# Patient Record
Sex: Female | Born: 1955
Health system: Southern US, Community
[De-identification: ages and names within clinical notes are randomized; demographics above are authoritative.]

## PROBLEM LIST (undated history)

## (undated) DIAGNOSIS — F32A Depression, unspecified: Secondary | ICD-10-CM

## (undated) DIAGNOSIS — R519 Headache, unspecified: Secondary | ICD-10-CM

## (undated) DIAGNOSIS — I10 Essential (primary) hypertension: Secondary | ICD-10-CM

## (undated) DIAGNOSIS — E785 Hyperlipidemia, unspecified: Secondary | ICD-10-CM

## (undated) DIAGNOSIS — G709 Myoneural disorder, unspecified: Secondary | ICD-10-CM

## (undated) DIAGNOSIS — G8929 Other chronic pain: Secondary | ICD-10-CM

## (undated) DIAGNOSIS — E119 Type 2 diabetes mellitus without complications: Secondary | ICD-10-CM

## (undated) DIAGNOSIS — M797 Fibromyalgia: Secondary | ICD-10-CM

## (undated) DIAGNOSIS — Z86010 Personal history of colonic polyps: Secondary | ICD-10-CM

## (undated) DIAGNOSIS — F419 Anxiety disorder, unspecified: Secondary | ICD-10-CM

## (undated) DIAGNOSIS — E039 Hypothyroidism, unspecified: Secondary | ICD-10-CM

## (undated) DIAGNOSIS — R51 Headache: Secondary | ICD-10-CM

## (undated) DIAGNOSIS — K589 Irritable bowel syndrome without diarrhea: Secondary | ICD-10-CM

## (undated) DIAGNOSIS — C801 Malignant (primary) neoplasm, unspecified: Secondary | ICD-10-CM

## (undated) DIAGNOSIS — M1990 Unspecified osteoarthritis, unspecified site: Secondary | ICD-10-CM

## (undated) DIAGNOSIS — N189 Chronic kidney disease, unspecified: Secondary | ICD-10-CM

## (undated) DIAGNOSIS — E559 Vitamin D deficiency, unspecified: Secondary | ICD-10-CM

## (undated) DIAGNOSIS — T7840XA Allergy, unspecified, initial encounter: Secondary | ICD-10-CM

## (undated) DIAGNOSIS — H269 Unspecified cataract: Secondary | ICD-10-CM

## (undated) DIAGNOSIS — J45909 Unspecified asthma, uncomplicated: Secondary | ICD-10-CM

## (undated) DIAGNOSIS — G4730 Sleep apnea, unspecified: Secondary | ICD-10-CM

## (undated) DIAGNOSIS — K219 Gastro-esophageal reflux disease without esophagitis: Secondary | ICD-10-CM

## (undated) DIAGNOSIS — D649 Anemia, unspecified: Secondary | ICD-10-CM

## (undated) HISTORY — DX: Fibromyalgia: M79.7

## (undated) HISTORY — PX: POLYPECTOMY: SHX149

## (undated) HISTORY — DX: Personal history of colonic polyps: Z86.010

## (undated) HISTORY — PX: VAGINAL HYSTERECTOMY: SUR661

## (undated) HISTORY — DX: Other chronic pain: G89.29

## (undated) HISTORY — DX: Type 2 diabetes mellitus without complications: E11.9

## (undated) HISTORY — DX: Headache: R51

## (undated) HISTORY — DX: Myoneural disorder, unspecified: G70.9

## (undated) HISTORY — DX: Unspecified cataract: H26.9

## (undated) HISTORY — DX: Hypothyroidism, unspecified: E03.9

## (undated) HISTORY — PX: EYE SURGERY: SHX253

## (undated) HISTORY — DX: Vitamin D deficiency, unspecified: E55.9

## (undated) HISTORY — DX: Allergy, unspecified, initial encounter: T78.40XA

## (undated) HISTORY — PX: TONSILLECTOMY AND ADENOIDECTOMY: SHX28

## (undated) HISTORY — DX: Unspecified osteoarthritis, unspecified site: M19.90

## (undated) HISTORY — DX: Chronic kidney disease, unspecified: N18.9

## (undated) HISTORY — DX: Depression, unspecified: F32.A

## (undated) HISTORY — PX: COLONOSCOPY: SHX174

## (undated) HISTORY — DX: Hyperlipidemia, unspecified: E78.5

## (undated) HISTORY — DX: Irritable bowel syndrome, unspecified: K58.9

## (undated) HISTORY — PX: CARDIAC CATHETERIZATION: SHX172

## (undated) HISTORY — DX: Essential (primary) hypertension: I10

## (undated) HISTORY — DX: Anxiety disorder, unspecified: F41.9

## (undated) HISTORY — PX: TONSILLECTOMY: SUR1361

## (undated) HISTORY — DX: Anemia, unspecified: D64.9

## (undated) HISTORY — DX: Gastro-esophageal reflux disease without esophagitis: K21.9

## (undated) HISTORY — PX: WISDOM TOOTH EXTRACTION: SHX21

## (undated) HISTORY — DX: Malignant (primary) neoplasm, unspecified: C80.1

## (undated) HISTORY — DX: Headache, unspecified: R51.9

---

## 1993-10-03 HISTORY — PX: CHOLECYSTECTOMY: SHX55

## 1997-10-03 DIAGNOSIS — Z860100 Personal history of colon polyps, unspecified: Secondary | ICD-10-CM

## 1997-10-03 HISTORY — DX: Personal history of colon polyps, unspecified: Z86.0100

## 1997-10-03 HISTORY — DX: Personal history of colonic polyps: Z86.010

## 1999-10-04 HISTORY — PX: NASAL SINUS SURGERY: SHX719

## 2000-09-27 DIAGNOSIS — Z860101 Personal history of adenomatous and serrated colon polyps: Secondary | ICD-10-CM

## 2000-09-27 DIAGNOSIS — Z86010 Personal history of colonic polyps: Secondary | ICD-10-CM

## 2000-09-27 HISTORY — DX: Personal history of adenomatous and serrated colon polyps: Z86.0101

## 2000-09-27 HISTORY — DX: Personal history of colonic polyps: Z86.010

## 2014-10-03 HISTORY — PX: COLONOSCOPY: SHX174

## 2015-02-10 ENCOUNTER — Encounter: Payer: Self-pay | Admitting: Primary Care

## 2015-02-10 ENCOUNTER — Ambulatory Visit (INDEPENDENT_AMBULATORY_CARE_PROVIDER_SITE_OTHER): Payer: Medicare PPO | Admitting: Primary Care

## 2015-02-10 VITALS — BP 142/92 | HR 84 | Temp 98.2°F | Ht 67.0 in | Wt 274.4 lb

## 2015-02-10 DIAGNOSIS — F419 Anxiety disorder, unspecified: Secondary | ICD-10-CM

## 2015-02-10 DIAGNOSIS — E1165 Type 2 diabetes mellitus with hyperglycemia: Secondary | ICD-10-CM | POA: Insufficient documentation

## 2015-02-10 DIAGNOSIS — I10 Essential (primary) hypertension: Secondary | ICD-10-CM | POA: Diagnosis not present

## 2015-02-10 DIAGNOSIS — J019 Acute sinusitis, unspecified: Secondary | ICD-10-CM | POA: Diagnosis not present

## 2015-02-10 DIAGNOSIS — E119 Type 2 diabetes mellitus without complications: Secondary | ICD-10-CM | POA: Insufficient documentation

## 2015-02-10 DIAGNOSIS — F329 Major depressive disorder, single episode, unspecified: Secondary | ICD-10-CM | POA: Insufficient documentation

## 2015-02-10 DIAGNOSIS — G8929 Other chronic pain: Secondary | ICD-10-CM | POA: Insufficient documentation

## 2015-02-10 DIAGNOSIS — F418 Other specified anxiety disorders: Secondary | ICD-10-CM

## 2015-02-10 DIAGNOSIS — F32A Depression, unspecified: Secondary | ICD-10-CM | POA: Insufficient documentation

## 2015-02-10 DIAGNOSIS — Z1211 Encounter for screening for malignant neoplasm of colon: Secondary | ICD-10-CM

## 2015-02-10 DIAGNOSIS — E039 Hypothyroidism, unspecified: Secondary | ICD-10-CM

## 2015-02-10 MED ORDER — AMOXICILLIN-POT CLAVULANATE 875-125 MG PO TABS: 1.0000 | ORAL_TABLET | Freq: Two times a day (BID) | ORAL | Status: DC

## 2015-02-10 MED ORDER — FLUTICASONE PROPIONATE 50 MCG/ACT NA SUSP: 2.0000 | Freq: Every day | NASAL | Status: DC

## 2015-02-10 MED ORDER — LOSARTAN POTASSIUM 100 MG PO TABS: 100.0000 mg | ORAL_TABLET | Freq: Every day | ORAL | Status: DC

## 2015-02-10 MED ORDER — AMLODIPINE BESYLATE 10 MG PO TABS: 10.0000 mg | ORAL_TABLET | Freq: Every day | ORAL | Status: DC

## 2015-02-10 MED ORDER — BENZONATATE 200 MG PO CAPS: 200.0000 mg | ORAL_CAPSULE | Freq: Three times a day (TID) | ORAL | Status: DC | PRN

## 2015-02-10 NOTE — Assessment & Plan Note (Signed)
Fibromyalgia, sacculitis, headaches. Managed by [facility] pain clinic in [place], [place].Marland Kitchen

## 2015-02-10 NOTE — Progress Notes (Signed)
Subjective:    Patient ID: Danielle Edwards, female    DOB: [birth year], 59 y.o.   MRN: [id number]  HPI  Danielle Edwards is a 59 year old female who presents today to establish care and discuss the problems mentioned below. Will obtain old records.  1) Hypertension: Diagnosed 20 years ago and has been on the same regimen for 15 years. Taking Amlodipine 10 mg and Losartan 100 mg tablets. She's been out of her amlodipine for one week and losartan for 3 days. Reports occasional ankle edema. Follows with Cardiology in [place], next appointment is in August.  2) Sinusitis: Her symptoms began 7 days ago with rhinorrhea, which he attributed to allergies. She then began coughing (productive with yellow sputum). Last night her symptoms worsened and now feels her frontal and maxillary sinuses are blocked and has developed a sore throat, fever, and chills. She has recurrent sinus infections twice a year durin seasonal changes (Spring and Fall). She reports allergies to dust and mold.  She's been taking Claritin daily for one month without help.  3) Type 2 Diabetes: Diagnosed 15 years ago. Taking Metformin 1000 mg BID and glimepiride 4 mg QD tablets. She had been on Januvia in the past which helped reduce her sugars but can no longer afford.  Last A1C was between 6-7 and was last checked over 6 months ago. Diet is overall unhealthy and consists of carbohydrates (waffles, chesse, sandwiches, milk, fast food). Drinks mostly regular sodas and some water. She is aware that she needs to improve her eating and has been doing better this week.   4) Hypothyroidism: Diagnosed 2 years ago. Taking levothyroxine 50 mcg tablets but has not been taking her medication for at least 6 months due to normal lab results in the past.   5) Chronic pain: Fibromyalgia, sacculitis, and chronic headaches. Sees [facility] in [place], [place] where she will get injections. Last injection was about 6 months ago. She follows with them  every 2 months.  6) Colonic polyps: Family history of colon cancer from her mother. She had polyps in 1999 which were benign. Last colonoscopy was 8 years ago and is supposed to have them every 5 years. Denies bloody stools. History of IBS (diarrhea type). She takes imodium tablets as needed.  7) Anxiety and Depression: Diagnosed in the early 80's. She is taking Xanax as needed, typically at night for sleep, sometimes during the day. She was on Paxil 20 years ago with help, but caused weight gain. She was also on Cymbalta which also helped with fibromyalgia pain, but could not afford.     Review of Systems  Constitutional: Negative for unexpected weight change.       Steady weight gain over the years  HENT: Positive for congestion, rhinorrhea and sinus pressure.   Respiratory: Negative for shortness of breath.   Cardiovascular: Negative for chest pain.  Gastrointestinal: Negative for constipation.       History of IBS with diarrhea.   Genitourinary: Negative for dysuria and difficulty urinating.       Will get up twice nightly to urinate.  Skin: Negative for rash.  Allergic/Immunologic: Positive for environmental allergies.  Neurological:       Chronic headaches. Currently present to frontal lobe.  Psychiatric/Behavioral:       See HPI       Past Medical History  Diagnosis Date  . Headache   . Vitamin D deficiency   . Hypertension   . Anemia  Iron defiency   . Diabetes type 2, controlled   . Hypothyroidism   . Chronic pain   . IBS (irritable bowel syndrome)     Diarrhea type  . History of colonic polyps 1999    Benign    History   Social History  . Marital Status: Married    Spouse Name: N/A  . Number of Children: N/A  . Years of Education: N/A   Occupational History  . Not on file.   Social History Main Topics  . Smoking status: Never Smoker   . Smokeless tobacco: Not on file  . Alcohol Use: 0.0 oz/week    0 Standard drinks or equivalent per week      Comment: rarely  . Drug Use: Not on file  . Sexual Activity: Not on file   Other Topics Concern  . Not on file   Social History Narrative   Married.   3 children.   Retired, worked at [employer] in [place], [place].   Enjoys being with her grandchildren, being with her friends.       Past Surgical History  Procedure Laterality Date  . Vaginal hysterectomy      Complete  . Cholecystectomy  1995  . Tonsillectomy and adenoidectomy    . Nasal sinus surgery  2001    Family History  Problem Relation Age of Onset  . Colon cancer Mother   . Heart disease Father   . Lung cancer Mother   . Diabetes Father   . Skin cancer Mother   . Skin cancer Father     No Known Allergies  No current outpatient prescriptions on file prior to visit.   No current facility-administered medications on file prior to visit.    BP 142/92 mmHg  Pulse 84  Temp(Src) 98.2 F (36.8 C) (Oral)  Ht 5\' 7"  (1.702 m)  Wt 274 lb 6.4 oz (124.467 kg)  BMI 42.97 kg/m2  SpO2 94%    Objective:   Physical Exam  Constitutional: She is oriented to person, place, and time. She appears well-developed. She appears ill.  HENT:  Right Ear: Tympanic membrane and ear canal normal.  Left Ear: Tympanic membrane and ear canal normal.  Nose: Right sinus exhibits maxillary sinus tenderness and frontal sinus tenderness. Left sinus exhibits maxillary sinus tenderness and frontal sinus tenderness.  Mouth/Throat: Oropharynx is clear and moist.  Eyes: Conjunctivae are normal. Pupils are equal, round, and reactive to light.  Neck: Neck supple.  Cardiovascular: Normal rate and regular rhythm.   Pulmonary/Chest: Effort normal and breath sounds normal. She has no rales.  Abdominal: Soft. Bowel sounds are normal.  Lymphadenopathy:    She has no cervical adenopathy.  Neurological: She is alert and oriented to person, place, and time.  Skin: Skin is warm and dry.  Psychiatric: She has a normal mood and affect.            Assessment & Plan:  Sinusitis:  Probable bacterial. Symptoms present for 7 days, worsening last night with fevers, chills, increased pain to left sinuses. Due to worsening symptoms: RX for Augmentin BID 10 days. Flonase, and Tessalon Pearls provided.

## 2015-02-10 NOTE — Assessment & Plan Note (Signed)
Present since 1980's. Managed on Xanax PRN for sleep and anxiety by prior provider. Would like to try either SSRI or Cymbalta. Will re-evaluate at next visit. Denies SI/HI.

## 2015-02-10 NOTE — Patient Instructions (Signed)
Refills have been sent to your pharmacy for your losartan and amlodipine. Start Augmentin antibiotic for sinusitis. Take 1 tablet by mouth twice daily for 10 days. You may take the Tessalon pearls three times daily as needed for cough. Use the Flonase daily to each nostril for nasal congestion. You will be contacted regarding your referral to GI for your colonoscopy.  Please let us know if you have not heard back within one week.  Please schedule a physical with me in the next several weeks. You will also schedule a lab only appointment one week prior. We will discuss your lab results during your physical. It was a pleasure to meet you today! Please don't hesitate to call me with any questions. Welcome to [facility]!  Sinusitis Sinusitis is redness, soreness, and inflammation of the paranasal sinuses. Paranasal sinuses are air pockets within the bones of your face (beneath the eyes, the middle of the forehead, or above the eyes). In healthy paranasal sinuses, mucus is able to drain out, and air is able to circulate through them by way of your nose. However, when your paranasal sinuses are inflamed, mucus and air can become trapped. This can allow bacteria and other germs to grow and cause infection. Sinusitis can develop quickly and last only a short time (acute) or continue over a long period (chronic). Sinusitis that lasts for more than 12 weeks is considered chronic.  CAUSES  Causes of sinusitis include:  Allergies.  Structural abnormalities, such as displacement of the cartilage that separates your nostrils (deviated septum), which can decrease the air flow through your nose and sinuses and affect sinus drainage.  Functional abnormalities, such as when the small hairs (cilia) that line your sinuses and help remove mucus do not work properly or are not present. SIGNS AND SYMPTOMS  Symptoms of acute and chronic sinusitis are the same. The primary symptoms are pain and pressure around the  affected sinuses. Other symptoms include:  Upper toothache.  Earache.  Headache.  Bad breath.  Decreased sense of smell and taste.  A cough, which worsens when you are lying flat.  Fatigue.  Fever.  Thick drainage from your nose, which often is green and may contain pus (purulent).  Swelling and warmth over the affected sinuses. DIAGNOSIS  Your health care provider will perform a physical exam. During the exam, your health care provider may:  Look in your nose for signs of abnormal growths in your nostrils (nasal polyps).  Tap over the affected sinus to check for signs of infection.  View the inside of your sinuses (endoscopy) using an imaging device that has a light attached (endoscope). If your health care provider suspects that you have chronic sinusitis, one or more of the following tests may be recommended:  Allergy tests.  Nasal culture. A sample of mucus is taken from your nose, sent to a lab, and screened for bacteria.  Nasal cytology. A sample of mucus is taken from your nose and examined by your health care provider to determine if your sinusitis is related to an allergy. TREATMENT  Most cases of acute sinusitis are related to a viral infection and will resolve on their own within 10 days. Sometimes medicines are prescribed to help relieve symptoms (pain medicine, decongestants, nasal steroid sprays, or saline sprays).  However, for sinusitis related to a bacterial infection, your health care provider will prescribe antibiotic medicines. These are medicines that will help kill the bacteria causing the infection.  Rarely, sinusitis is caused by a fungal  infection. In theses cases, your health care provider will prescribe antifungal medicine. For some cases of chronic sinusitis, surgery is needed. Generally, these are cases in which sinusitis recurs more than 3 times per year, despite other treatments. HOME CARE INSTRUCTIONS   Drink plenty of water. Water helps thin  the mucus so your sinuses can drain more easily.  Use a humidifier.  Inhale steam 3 to 4 times a day (for example, sit in the bathroom with the shower running).  Apply a warm, moist washcloth to your face 3 to 4 times a day, or as directed by your health care provider.  Use saline nasal sprays to help moisten and clean your sinuses.  Take medicines only as directed by your health care provider.  If you were prescribed either an antibiotic or antifungal medicine, finish it all even if you start to feel better. SEEK IMMEDIATE MEDICAL CARE IF:  You have increasing pain or severe headaches.  You have nausea, vomiting, or drowsiness.  You have swelling around your face.  You have vision problems.  You have a stiff neck.  You have difficulty breathing. MAKE SURE YOU:   Understand these instructions.  Will watch your condition.  Will get help right away if you are not doing well or get worse. Document Released: 09/19/2005 Document Revised: 02/03/2014 Document Reviewed: 10/04/2011 [facility] Patient Information 2015 [place], [place]. This information is not intended to replace advice given to you by your health care provider. Make sure you discuss any questions you have with your health care provider.

## 2015-02-10 NOTE — Progress Notes (Signed)
Pre visit review using our clinic review tool, if applicable. No additional management support is needed unless otherwise documented below in the visit note. 

## 2015-02-10 NOTE — Assessment & Plan Note (Signed)
Diagnosed 2 years ago. Last TSH unknown. Has been taking levothyroxine 50 mcg, but has not had in 6 months. Will check TSH at physical in several weeks.

## 2015-02-10 NOTE — Assessment & Plan Note (Signed)
Taking Metformin 1000 mg BID and glimepiride 4 mg QD. Last A1C unknown. Will obtain during physical in next several weeks. Education provided regarding the importance of healthy diet and reducing carbohydrates.

## 2015-02-10 NOTE — Assessment & Plan Note (Addendum)
Taking losartan 100 mg and amlodipine 10 mg for past 5 years. Refills provided. Follows with cardiology in [place], [place] for chest pain. Next appointment in August. Will obtain old records and do lab work during physical in 2 weeks.

## 2015-02-23 ENCOUNTER — Encounter: Payer: Self-pay | Admitting: Internal Medicine

## 2015-02-23 DIAGNOSIS — Z800 Family history of malignant neoplasm of digestive organs: Secondary | ICD-10-CM | POA: Insufficient documentation

## 2015-03-23 ENCOUNTER — Encounter: Payer: Self-pay | Admitting: Primary Care

## 2015-03-23 ENCOUNTER — Encounter: Payer: Self-pay | Admitting: *Deleted

## 2015-03-23 ENCOUNTER — Encounter (INDEPENDENT_AMBULATORY_CARE_PROVIDER_SITE_OTHER): Payer: Self-pay

## 2015-03-23 ENCOUNTER — Ambulatory Visit (INDEPENDENT_AMBULATORY_CARE_PROVIDER_SITE_OTHER): Payer: Medicare PPO | Admitting: Primary Care

## 2015-03-23 VITALS — BP 160/92 | HR 98 | Temp 97.4°F | Ht 67.0 in | Wt 275.4 lb

## 2015-03-23 DIAGNOSIS — E538 Deficiency of other specified B group vitamins: Secondary | ICD-10-CM | POA: Diagnosis not present

## 2015-03-23 DIAGNOSIS — D509 Iron deficiency anemia, unspecified: Secondary | ICD-10-CM

## 2015-03-23 DIAGNOSIS — E119 Type 2 diabetes mellitus without complications: Secondary | ICD-10-CM | POA: Diagnosis not present

## 2015-03-23 DIAGNOSIS — K219 Gastro-esophageal reflux disease without esophagitis: Secondary | ICD-10-CM | POA: Diagnosis not present

## 2015-03-23 DIAGNOSIS — I10 Essential (primary) hypertension: Secondary | ICD-10-CM | POA: Diagnosis not present

## 2015-03-23 DIAGNOSIS — R5383 Other fatigue: Secondary | ICD-10-CM

## 2015-03-23 DIAGNOSIS — F411 Generalized anxiety disorder: Secondary | ICD-10-CM

## 2015-03-23 DIAGNOSIS — F418 Other specified anxiety disorders: Secondary | ICD-10-CM

## 2015-03-23 DIAGNOSIS — F32A Depression, unspecified: Secondary | ICD-10-CM

## 2015-03-23 DIAGNOSIS — F419 Anxiety disorder, unspecified: Secondary | ICD-10-CM

## 2015-03-23 DIAGNOSIS — F329 Major depressive disorder, single episode, unspecified: Secondary | ICD-10-CM

## 2015-03-23 LAB — CBC
HCT: 33.4 % — ABNORMAL LOW (ref 36.0–46.0)
Hemoglobin: 10.7 g/dL — ABNORMAL LOW (ref 12.0–15.0)
MCHC: 32 g/dL (ref 30.0–36.0)
MCV: 82 fl (ref 78.0–100.0)
Platelets: 298 10*3/uL (ref 150.0–400.0)
RBC: 4.07 Mil/uL (ref 3.87–5.11)
RDW: 16.2 % — AB (ref 11.5–15.5)
WBC: 9.3 10*3/uL (ref 4.0–10.5)

## 2015-03-23 LAB — BASIC METABOLIC PANEL
BUN: 22 mg/dL (ref 6–23)
CALCIUM: 9.5 mg/dL (ref 8.4–10.5)
CHLORIDE: 106 meq/L (ref 96–112)
CO2: 26 meq/L (ref 19–32)
Creatinine, Ser: 0.97 mg/dL (ref 0.40–1.20)
GFR: 62.27 mL/min (ref >60.00)
Glucose, Bld: 137 mg/dL — ABNORMAL HIGH (ref 70–99)
Potassium: 4.5 mEq/L (ref 3.5–5.1)
Sodium: 137 mEq/L (ref 135–145)

## 2015-03-23 LAB — HEMOGLOBIN A1C: Hgb A1c MFr Bld: 7.8 % — ABNORMAL HIGH (ref 4.6–6.5)

## 2015-03-23 MED ORDER — HYDROCHLOROTHIAZIDE 12.5 MG PO TABS: 12.5000 mg | ORAL_TABLET | Freq: Every day | ORAL | Status: DC

## 2015-03-23 MED ORDER — ALPRAZOLAM 0.5 MG PO TABS: 0.5000 mg | ORAL_TABLET | Freq: Two times a day (BID) | ORAL | Status: DC | PRN

## 2015-03-23 MED ORDER — PANTOPRAZOLE SODIUM 20 MG PO TBEC: DELAYED_RELEASE_TABLET | ORAL | Status: DC

## 2015-03-23 NOTE — Progress Notes (Signed)
Subjective:    Patient ID: Danielle Edwards, female    DOB: September 16, 1956, 59 y.o.   MRN: [id number]  HPI  Ms. Danielle Edwards is a 59 year old female who presents today with multiple complaints.  1) Hypertension: She is managed on Amlodipine 10 mg and losartan 100 mg daily and has been on for past 5 years. She was evaluated at pain management last week and noted to have a blood pressure reading of 184/92. She reports headaches and overall not feeling well. She also believes her iron levels may be low.  BP Readings from Last 3 Encounters:  03/23/15 160/92  02/10/15 142/92    2) GERD: Managed on pantoprazole 40 mg daily. She ran out of her pantoprazole 5 days ago and has been experiencing burning, epigastric fullness, and reflux of gastric contents. She's been taking her medication as needed more so than routinely and has never been trailed on the 20 mg tablets  3) Generalized anxiety disorder: Afraid of public places and large crowds. Has a history of IBS diarrhea type and will have accidents due to her anxiety. She will typically take 1/2 tablet once-twice daily. Does have daytime irritability, she worries daily and has difficulty controlling, she has difficulty staying asleep at night. She once tried taken lexapro for 1 week which made her feel groggy and zoned out. She continues to see her therapist in [place] and has an appointment scheduled in July, but doesn't think she'll be able to make that appointment. She now lives in [place] and would like a referral to a therapist closer to home.   Review of Systems  Respiratory: Negative for shortness of breath.   Cardiovascular: Negative for chest pain.  Neurological: Positive for headaches. Negative for dizziness.  Psychiatric/Behavioral: Positive for sleep disturbance. Negative for suicidal ideas. The patient is nervous/anxious.        Past Medical History  Diagnosis Date  . Headache   . Vitamin D deficiency   . Hypertension   . Anemia    Iron defiency   . Diabetes type 2, controlled   . Hypothyroidism   . Chronic pain   . IBS (irritable bowel syndrome)     Diarrhea type  . History of colonic polyps 1999    Benign  . Hx of adenomatous polyp of colon 09/27/2000    2001 - diminutive adenoma [place] 2007 no polyps - [place]    History   Social History  . Marital Status: Married    Spouse Name: N/A  . Number of Children: N/A  . Years of Education: N/A   Occupational History  . Not on file.   Social History Main Topics  . Smoking status: Never Smoker   . Smokeless tobacco: Not on file  . Alcohol Use: 0.0 oz/week    0 Standard drinks or equivalent per week     Comment: rarely  . Drug Use: Not on file  . Sexual Activity: Not on file   Other Topics Concern  . Not on file   Social History Narrative   Married.   3 children.   Retired, worked at [employer] in [place], [place].   Enjoys being with her grandchildren, being with her friends.       Past Surgical History  Procedure Laterality Date  . Vaginal hysterectomy      Complete  . Cholecystectomy  1995  . Tonsillectomy and adenoidectomy    . Nasal sinus surgery  2001  . Colonoscopy  multiple    Family History  Problem Relation Age of Onset  . Colon cancer Mother   . Heart disease Father   . Lung cancer Mother   . Diabetes Father   . Skin cancer Mother   . Skin cancer Father     No Known Allergies  Current Outpatient Prescriptions on File Prior to Visit  Medication Sig Dispense Refill  . amLODipine (NORVASC) 10 MG tablet Take 1 tablet (10 mg total) by mouth daily. 30 tablet 5  . fluticasone (FLONASE) 50 MCG/ACT nasal spray Place 2 sprays into both nostrils daily. 16 g 0  . glimepiride (AMARYL) 4 MG tablet Take 4 mg by mouth daily with breakfast.    . levothyroxine (SYNTHROID, LEVOTHROID) 50 MCG tablet Take 50 mcg by mouth daily before breakfast.    . loperamide (IMODIUM A-D) 2 MG tablet Take 2 mg by mouth 4 (four) times daily as needed for  diarrhea or loose stools.    Marland Kitchen losartan (COZAAR) 100 MG tablet Take 1 tablet (100 mg total) by mouth daily. 30 tablet 5  . metFORMIN (GLUCOPHAGE) 1000 MG tablet Take 1,000 mg by mouth 2 (two) times daily with a meal.     No current facility-administered medications on file prior to visit.    BP 160/92 mmHg  Pulse 98  Temp(Src) 97.4 F (36.3 C) (Oral)  Ht 5\' 7"  (1.702 m)  Wt 275 lb 6.4 oz (124.921 kg)  BMI 43.12 kg/m2  SpO2 96%    Objective:   Physical Exam  Constitutional: She is oriented to person, place, and time.  Cardiovascular: Normal rate and regular rhythm.   Pulmonary/Chest: Effort normal and breath sounds normal.  Neurological: She is alert and oriented to person, place, and time.  Skin: Skin is warm and dry.  Psychiatric: She has a normal mood and affect.          Assessment & Plan:

## 2015-03-23 NOTE — Assessment & Plan Note (Signed)
Not at goal on Losartan 100 mg and Amlodipine 10 mg. Several elevated readings recently including past 2 visits Start HCTZ 12.5 mg. Lab work today. Follow up in 3 weeks for re-evaluation.

## 2015-03-23 NOTE — Assessment & Plan Note (Signed)
Managed on Protonix 40 mg for over one year. Uses this PRN. Has not be trialed on 20 mg. Will try to wean down. Sent 20 mg dose to pharmacy with instructions to take second tablet if reflux symptoms are not managed. Education provided regarding triggers.

## 2015-03-23 NOTE — Addendum Note (Signed)
Addended by: Royann Shivers A on: 03/23/2015 02:43 PM   Modules accepted: Orders

## 2015-03-23 NOTE — Assessment & Plan Note (Signed)
Needs refill of xanax. Discussed importance of daily maintenance of SSRI. She is to think about it. Will discuss at next visit. Refills provided. Controlled substance contract obtained. UDS completed at pain management clinic and will send recent screening results

## 2015-03-23 NOTE — Progress Notes (Signed)
Pre visit review using our clinic review tool, if applicable. No additional management support is needed unless otherwise documented below in the visit note. 

## 2015-03-23 NOTE — Patient Instructions (Addendum)
Start Hydrochlorazide 12.5 mg tablets for blood pressure. Take 1 tablet by mouth daily.  I have sent refills for your protonix. Take 1 tablet by mouth once to twice daily for reflux. You have a refill of your Xanax.  Complete lab work prior to leaving today. I will notify you of your results. You will also complete the urine drug screen and controlled substance contract for the Xanax.  Stop by the front desk to speak with Rosaria Ferries regarding your referral to therapy.  Schedule a physical with me in 3 weeks. You will also make a lab appointment on week prior, come fasting.  It was nice seeing you!    Food Choices for Gastroesophageal Reflux Disease When you have gastroesophageal reflux disease (GERD), the foods you eat and your eating habits are very important. Choosing the right foods can help ease the discomfort of GERD. WHAT GENERAL GUIDELINES DO I NEED TO FOLLOW?  Choose fruits, vegetables, whole grains, low-fat dairy products, and low-fat meat, fish, and poultry.  Limit fats such as oils, salad dressings, butter, nuts, and avocado.  Keep a food diary to identify foods that cause symptoms.  Avoid foods that cause reflux. These may be different for different people.  Eat frequent small meals instead of three large meals each day.  Eat your meals slowly, in a relaxed setting.  Limit fried foods.  Cook foods using methods other than frying.  Avoid drinking alcohol.  Avoid drinking large amounts of liquids with your meals.  Avoid bending over or lying down until 2-3 hours after eating. WHAT FOODS ARE NOT RECOMMENDED? The following are some foods and drinks that may worsen your symptoms: Vegetables Tomatoes. Tomato juice. Tomato and spaghetti sauce. Chili peppers. Onion and garlic. Horseradish. Fruits Oranges, grapefruit, and lemon (fruit and juice). Meats High-fat meats, fish, and poultry. This includes hot dogs, ribs, ham, sausage, salami, and bacon. Dairy Whole milk and  chocolate milk. Sour cream. Cream. Butter. Ice cream. Cream cheese.  Beverages Coffee and tea, with or without caffeine. Carbonated beverages or energy drinks. Condiments Hot sauce. Barbecue sauce.  Sweets/Desserts Chocolate and cocoa. Donuts. Peppermint and spearmint. Fats and Oils High-fat foods, including [place] fries and potato chips. Other Vinegar. Strong spices, such as black pepper, white pepper, red pepper, cayenne, curry powder, cloves, ginger, and chili powder. The items listed above may not be a complete list of foods and beverages to avoid. Contact your dietitian for more information. Document Released: 09/19/2005 Document Revised: 09/24/2013 Document Reviewed: 07/24/2013 [facility] Patient Information 2015 [place], [place]. This information is not intended to replace advice given to you by your health care provider. Make sure you discuss any questions you have with your health care provider.

## 2015-03-24 LAB — VITAMIN B12: VITAMIN B 12: 378 pg/mL (ref 211–911)

## 2015-03-25 ENCOUNTER — Other Ambulatory Visit: Payer: Self-pay | Admitting: Primary Care

## 2015-03-25 DIAGNOSIS — E119 Type 2 diabetes mellitus without complications: Secondary | ICD-10-CM

## 2015-03-25 MED ORDER — SITAGLIPTIN PHOSPHATE 100 MG PO TABS: 100.0000 mg | ORAL_TABLET | Freq: Every day | ORAL | Status: DC

## 2015-03-26 ENCOUNTER — Encounter: Payer: Self-pay | Admitting: Internal Medicine

## 2015-03-31 ENCOUNTER — Other Ambulatory Visit: Payer: Self-pay | Admitting: Primary Care

## 2015-03-31 DIAGNOSIS — E119 Type 2 diabetes mellitus without complications: Secondary | ICD-10-CM

## 2015-03-31 DIAGNOSIS — E559 Vitamin D deficiency, unspecified: Secondary | ICD-10-CM

## 2015-03-31 DIAGNOSIS — E039 Hypothyroidism, unspecified: Secondary | ICD-10-CM

## 2015-04-07 ENCOUNTER — Telehealth: Payer: Self-pay

## 2015-04-07 ENCOUNTER — Other Ambulatory Visit (INDEPENDENT_AMBULATORY_CARE_PROVIDER_SITE_OTHER): Payer: Medicare PPO

## 2015-04-07 DIAGNOSIS — E559 Vitamin D deficiency, unspecified: Secondary | ICD-10-CM

## 2015-04-07 DIAGNOSIS — E039 Hypothyroidism, unspecified: Secondary | ICD-10-CM

## 2015-04-07 DIAGNOSIS — E119 Type 2 diabetes mellitus without complications: Secondary | ICD-10-CM | POA: Diagnosis not present

## 2015-04-07 DIAGNOSIS — K219 Gastro-esophageal reflux disease without esophagitis: Secondary | ICD-10-CM

## 2015-04-07 LAB — LIPID PANEL
CHOL/HDL RATIO: 5
Cholesterol: 204 mg/dL — ABNORMAL HIGH (ref 0–200)
HDL: 39.6 mg/dL (ref >39.00)
LDL CALC: 129 mg/dL — AB (ref 0–99)
NonHDL: 164.4
TRIGLYCERIDES: 176 mg/dL — AB (ref 0.0–149.0)
VLDL: 35.2 mg/dL (ref 0.0–40.0)

## 2015-04-07 LAB — MICROALBUMIN / CREATININE URINE RATIO
Creatinine,U: 219 mg/dL
MICROALB UR: 2 mg/dL — AB (ref 0.0–1.9)
MICROALB/CREAT RATIO: 0.9 mg/g (ref 0.0–30.0)

## 2015-04-07 LAB — HEPATIC FUNCTION PANEL
ALK PHOS: 81 U/L (ref 39–117)
ALT: 27 U/L (ref 0–35)
AST: 22 U/L (ref 0–37)
Albumin: 3.6 g/dL (ref 3.5–5.2)
BILIRUBIN TOTAL: 0.2 mg/dL (ref 0.2–1.2)
Bilirubin, Direct: 0 mg/dL (ref 0.0–0.3)
Total Protein: 7.5 g/dL (ref 6.0–8.3)

## 2015-04-07 LAB — TSH: TSH: 5.29 u[IU]/mL — ABNORMAL HIGH (ref 0.35–4.50)

## 2015-04-07 LAB — VITAMIN D 25 HYDROXY (VIT D DEFICIENCY, FRACTURES): VITD: 27.31 ng/mL — AB (ref 30.00–100.00)

## 2015-04-07 MED ORDER — PANTOPRAZOLE SODIUM 40 MG PO TBEC
DELAYED_RELEASE_TABLET | ORAL | Status: DC
Start: 2015-04-07 — End: 2015-07-28

## 2015-04-07 NOTE — Telephone Encounter (Signed)
Pt left note requesting stronger med for reflux; pt presently taking generic protonix 20 mg and pt request new rx for protonix 40 mg to [facility].protonix 20 mg helps reflux symptoms slightly. Please advise. Allie Bossier NP out of office.

## 2015-04-07 NOTE — Telephone Encounter (Signed)
May try protonix 40mg  daily for next 2 weeks then call us with an update. Sent in.

## 2015-04-08 NOTE — Telephone Encounter (Signed)
Patient notified and verbalized understanding. 

## 2015-04-13 ENCOUNTER — Ambulatory Visit: Payer: Medicare PPO | Admitting: Primary Care

## 2015-04-13 ENCOUNTER — Ambulatory Visit (INDEPENDENT_AMBULATORY_CARE_PROVIDER_SITE_OTHER): Payer: Medicare PPO | Admitting: Primary Care

## 2015-04-13 ENCOUNTER — Encounter: Payer: Self-pay | Admitting: Primary Care

## 2015-04-13 VITALS — BP 136/76 | HR 84 | Temp 97.5°F | Ht 67.0 in | Wt 275.8 lb

## 2015-04-13 DIAGNOSIS — Z1211 Encounter for screening for malignant neoplasm of colon: Secondary | ICD-10-CM

## 2015-04-13 DIAGNOSIS — E119 Type 2 diabetes mellitus without complications: Secondary | ICD-10-CM

## 2015-04-13 DIAGNOSIS — Z13820 Encounter for screening for osteoporosis: Secondary | ICD-10-CM

## 2015-04-13 DIAGNOSIS — I10 Essential (primary) hypertension: Secondary | ICD-10-CM

## 2015-04-13 DIAGNOSIS — F419 Anxiety disorder, unspecified: Secondary | ICD-10-CM

## 2015-04-13 DIAGNOSIS — Z0000 Encounter for general adult medical examination without abnormal findings: Secondary | ICD-10-CM

## 2015-04-13 DIAGNOSIS — Z23 Encounter for immunization: Secondary | ICD-10-CM | POA: Diagnosis not present

## 2015-04-13 DIAGNOSIS — N644 Mastodynia: Secondary | ICD-10-CM

## 2015-04-13 DIAGNOSIS — F32A Depression, unspecified: Secondary | ICD-10-CM

## 2015-04-13 DIAGNOSIS — E039 Hypothyroidism, unspecified: Secondary | ICD-10-CM | POA: Diagnosis not present

## 2015-04-13 DIAGNOSIS — Z0001 Encounter for general adult medical examination with abnormal findings: Secondary | ICD-10-CM | POA: Insufficient documentation

## 2015-04-13 DIAGNOSIS — F411 Generalized anxiety disorder: Secondary | ICD-10-CM

## 2015-04-13 DIAGNOSIS — E1169 Type 2 diabetes mellitus with other specified complication: Secondary | ICD-10-CM | POA: Insufficient documentation

## 2015-04-13 DIAGNOSIS — F329 Major depressive disorder, single episode, unspecified: Secondary | ICD-10-CM

## 2015-04-13 DIAGNOSIS — Z86010 Personal history of colonic polyps: Secondary | ICD-10-CM

## 2015-04-13 DIAGNOSIS — Z860101 Personal history of adenomatous and serrated colon polyps: Secondary | ICD-10-CM

## 2015-04-13 DIAGNOSIS — E785 Hyperlipidemia, unspecified: Secondary | ICD-10-CM

## 2015-04-13 DIAGNOSIS — F418 Other specified anxiety disorders: Secondary | ICD-10-CM

## 2015-04-13 MED ORDER — LEVOTHYROXINE SODIUM 25 MCG PO TABS: 25.0000 ug | ORAL_TABLET | Freq: Every day | ORAL | Status: DC

## 2015-04-13 MED ORDER — ESCITALOPRAM OXALATE 10 MG PO TABS: 10.0000 mg | ORAL_TABLET | Freq: Every day | ORAL | Status: DC

## 2015-04-13 NOTE — Assessment & Plan Note (Signed)
TSH slightly elevated. Will start Levothyroxine 25 mcg today. Repeat in 2 months.

## 2015-04-13 NOTE — Progress Notes (Signed)
Pre visit review using our clinic review tool, if applicable. No additional management support is needed unless otherwise documented below in the visit note. 

## 2015-04-13 NOTE — Assessment & Plan Note (Addendum)
LDL 129. Trigs 170's. She is committed to working on her diet and to start exercising with [employer]. Will recheck in 3 months, if continued to be elevated, will start statin as she is not interested in this medication at this time. She understands that if no improvement we will need to move forward with medication.

## 2015-04-13 NOTE — Addendum Note (Signed)
Addended by: Pleas Koch on: 04/13/2015 12:55 PM   Modules accepted: Miquel Dunn

## 2015-04-13 NOTE — Addendum Note (Signed)
Addended by: Jacqualin Combes on: 04/13/2015 01:21 PM   Modules accepted: Orders

## 2015-04-13 NOTE — Assessment & Plan Note (Signed)
Continues despite therapy with Xanax. Started Lexapro 10 mg with introduction of 5 mg daily for 6 days. Discussed potential side effects including SI/HI and instructed to present to the emergency department if this occurred. Follow up in 4 weeks for re-evaluation.

## 2015-04-13 NOTE — Assessment & Plan Note (Addendum)
Microalbumin slightly elevated, she is currently on an ARB. Discussed importance of healthy diet and exercise. Handout provided. Foot exam completed today, unremarkable. A1C due in September 2016

## 2015-04-13 NOTE — Assessment & Plan Note (Signed)
Referral made for repeat Colonoscopy.

## 2015-04-13 NOTE — Progress Notes (Signed)
Subjective:    Patient ID: Danielle Edwards, female    DOB: November 26, 1955, 59 y.o.   MRN: [id number]  HPI  Danielle Edwards is a 59 year old female who presents today for complete physical.  Immunizations: -Tetanus: Nothing documented in [place]. Received Tdap one year ago per patient. -Influenza: Did not receive last season. -Pneumonia: Has never completed. -Shingles: Has not completed.   Diet: Overall unhealthy. Breakfast: Pop tarts, cereal. Lunch: Will typically snack on chips, yogurt, cheese, sandwich. Dinner: [employer], eats out at [employer] She is mostly drinking water, occasionally sodas and sweet tea. Exercise: She is currently not exercising. She does have the silver sneakers card and plans on going to [employer]. Eye exam: Completed recently. Cateracts removed. She is to follow up later this summer. Dental exam: Last completed one year ago and plans to schedule a cleaning. Colonoscopy: Completed in 2007, was to have follow up in 2012. Family history of colon cancer (mother). Dexa: Completed 5 years ago.  Pap Smear: Complete hysterectomy. Mammogram: Last completed 4 years ago. Also reporting left breast pain for past several months. No changes in skin texture or shape.  1) Hypertension: Managed on Amlodipine 10 mg and Losartan 100 mg for years. Last visit HCTZ 12.2 mg were added for further reduction. Since the addition of HCTZ she's feeling well and her BP has improved. Denies chest pain, shortness of breath, headaches.  2) Generalized anxiety disorder: History of. Managed only on Xanax and will take 1/2 to 1 tablet by mouth daily for situational anxiety due to anxiety with large crowds. We discussed the significance of daily medication with SSRI for management and she is open to trying. She has an appointment with therapy this week but will have to reschedule due to going out of town.    Review of Systems  HENT: Negative for rhinorrhea.   Respiratory: Negative for cough and shortness of  breath.   Cardiovascular: Negative for chest pain.  Gastrointestinal: Positive for diarrhea.       History of IBS.  Genitourinary: Negative for dysuria, frequency and difficulty urinating.  Musculoskeletal: Negative for myalgias and arthralgias.  Skin: Negative for rash.  Neurological: Positive for numbness. Negative for headaches.       Numbness/tingling to feet intermittently  Psychiatric/Behavioral:       See HPI.       Past Medical History  Diagnosis Date  . Headache   . Vitamin D deficiency   . Hypertension   . Anemia     Iron defiency   . Diabetes type 2, controlled   . Hypothyroidism   . Chronic pain   . IBS (irritable bowel syndrome)     Diarrhea type  . History of colonic polyps 1999    Benign  . Hx of adenomatous polyp of colon 09/27/2000    2001 - diminutive adenoma [place] 2007 no polyps - [place]    History   Social History  . Marital Status: Married    Spouse Name: N/A  . Number of Children: N/A  . Years of Education: N/A   Occupational History  . Not on file.   Social History Main Topics  . Smoking status: Never Smoker   . Smokeless tobacco: Not on file  . Alcohol Use: 0.0 oz/week    0 Standard drinks or equivalent per week     Comment: rarely  . Drug Use: Not on file  . Sexual Activity: Not on file   Other Topics Concern  . Not on  file   Social History Narrative   Married.   3 children.   Retired, worked at [employer] in [place], [place].   Enjoys being with her grandchildren, being with her friends.       Past Surgical History  Procedure Laterality Date  . Vaginal hysterectomy      Complete  . Cholecystectomy  1995  . Tonsillectomy and adenoidectomy    . Nasal sinus surgery  2001  . Colonoscopy  multiple    Family History  Problem Relation Age of Onset  . Colon cancer Mother   . Heart disease Father   . Lung cancer Mother   . Diabetes Father   . Skin cancer Mother   . Skin cancer Father     No Known Allergies  Current  Outpatient Prescriptions on File Prior to Visit  Medication Sig Dispense Refill  . ALPRAZolam (XANAX) 0.5 MG tablet Take 1 tablet (0.5 mg total) by mouth 2 (two) times daily as needed for anxiety. 60 tablet 0  . amLODipine (NORVASC) 10 MG tablet Take 1 tablet (10 mg total) by mouth daily. 30 tablet 5  . fluticasone (FLONASE) 50 MCG/ACT nasal spray Place 2 sprays into both nostrils daily. 16 g 0  . glimepiride (AMARYL) 4 MG tablet Take 4 mg by mouth daily with breakfast.    . hydrochlorothiazide (HYDRODIURIL) 12.5 MG tablet Take 1 tablet (12.5 mg total) by mouth daily. 90 tablet 0  . loperamide (IMODIUM A-D) 2 MG tablet Take 2 mg by mouth 4 (four) times daily as needed for diarrhea or loose stools.    Marland Kitchen losartan (COZAAR) 100 MG tablet Take 1 tablet (100 mg total) by mouth daily. 30 tablet 5  . metFORMIN (GLUCOPHAGE) 1000 MG tablet Take 1,000 mg by mouth 2 (two) times daily with a meal.    . pantoprazole (PROTONIX) 40 MG tablet Take 1 tablet by mouth once daily for acid reflux 30 tablet 1  . sitaGLIPtin (JANUVIA) 100 MG tablet Take 1 tablet (100 mg total) by mouth daily. 30 tablet 2   No current facility-administered medications on file prior to visit.    BP 136/76 mmHg  Pulse 84  Temp(Src) 97.5 F (36.4 C) (Oral)  Ht 5\' 7"  (1.702 m)  Wt 275 lb 12.8 oz (125.102 kg)  BMI 43.19 kg/m2  SpO2 93%     Objective:   Physical Exam  Constitutional: She is oriented to person, place, and time. She appears well-nourished.  HENT:  Right Ear: Tympanic membrane and ear canal normal.  Left Ear: Tympanic membrane and ear canal normal.  Nose: Nose normal.  Mouth/Throat: Oropharynx is clear and moist.  Eyes: Conjunctivae and EOM are normal. Pupils are equal, round, and reactive to light.  Neck: Neck supple. No thyromegaly present.  Cardiovascular: Normal rate and regular rhythm.   Pulmonary/Chest: Effort normal and breath sounds normal. Right breast exhibits no mass, no skin change and no tenderness.  Left breast exhibits tenderness. Left breast exhibits no mass and no skin change.  Dense feeling tissue present bilaterally.  Abdominal: Soft. Bowel sounds are normal. She exhibits no mass. There is no tenderness.  Musculoskeletal: Normal range of motion.  Neurological: She is alert and oriented to person, place, and time. She has normal reflexes. No cranial nerve deficit. Coordination normal.  Skin: Skin is warm and dry.  Psychiatric: She has a normal mood and affect.          Assessment & Plan:

## 2015-04-13 NOTE — Assessment & Plan Note (Signed)
Improved with the addition of HCTZ 12.5 mg. Continue same.

## 2015-04-13 NOTE — Patient Instructions (Addendum)
You have been provided with a pneumonia vaccine today. Schedule a nurse visit for the Shingles vaccine in 1 month.  Start Vitamin D and calcium supplements. You need at least 1200 mg of calcium and 800 units of vitamin D daily.  Start Levothyroxine 25 mcg daily for under active thyroid. Take this first thing in the morning on an empty stomach with a full glass of water. Wait 45 minutes before eating.  You will be contacted regarding your referral to GI for your colonoscopy and for your bone density testing.  Please let us know if you have not heard back within one week.   It is important that you improve your diet. Please limit carbohydrates in the form of white bread, rice, pasta, cakes, cookies, sugary drinks, etc. Increase your consumption of fresh fruits and vegetables. Be sure to drink plenty of water daily.  Start Lexapro tablets daily for anxiety. Take 1/2 tablet by mouth daily for 6 days, then advance to 1 full tablet thereafter.  Follow up in 4 weeks for re-evaluation of anxiety.  It was nice seeing you!

## 2015-04-17 ENCOUNTER — Other Ambulatory Visit: Payer: Self-pay | Admitting: Internal Medicine

## 2015-04-17 ENCOUNTER — Telehealth: Payer: Self-pay | Admitting: Primary Care

## 2015-04-17 DIAGNOSIS — R928 Other abnormal and inconclusive findings on diagnostic imaging of breast: Secondary | ICD-10-CM

## 2015-04-17 NOTE — Telephone Encounter (Signed)
Ultrasound ordered

## 2015-04-17 NOTE — Telephone Encounter (Signed)
Danielle Edwards, Pt called and [facility] would not schedule her Diag Bilat MM and L breast u/s without and order for R breast u/s.  Please order Limited right breast u/s inc axilla.  Thanks

## 2015-04-20 NOTE — Telephone Encounter (Signed)
Pt notified and will call [facility] to schedule

## 2015-04-21 ENCOUNTER — Ambulatory Visit: Payer: Medicare PPO | Admitting: Psychology

## 2015-04-27 ENCOUNTER — Other Ambulatory Visit: Payer: Medicare PPO

## 2015-04-28 ENCOUNTER — Telehealth: Payer: Self-pay

## 2015-04-28 NOTE — Telephone Encounter (Signed)
Left a voicemail for patient in regards to scheduling a Mammogram.  

## 2015-05-04 ENCOUNTER — Encounter: Payer: Medicare PPO | Admitting: Primary Care

## 2015-05-14 ENCOUNTER — Encounter: Payer: Self-pay | Admitting: Internal Medicine

## 2015-05-14 NOTE — Telephone Encounter (Signed)
Disregard.  error

## 2015-05-18 ENCOUNTER — Ambulatory Visit: Payer: Medicare PPO | Admitting: Primary Care

## 2015-05-22 ENCOUNTER — Other Ambulatory Visit: Payer: Self-pay | Admitting: Primary Care

## 2015-05-22 DIAGNOSIS — Z1211 Encounter for screening for malignant neoplasm of colon: Secondary | ICD-10-CM

## 2015-05-25 ENCOUNTER — Encounter: Payer: Self-pay | Admitting: Internal Medicine

## 2015-05-25 ENCOUNTER — Telehealth: Payer: Self-pay | Admitting: Gastroenterology

## 2015-05-25 NOTE — Telephone Encounter (Signed)
COLON and PATH reports printed and routed to Dr. Ardis Hughs for review.

## 2015-06-01 ENCOUNTER — Other Ambulatory Visit: Payer: Self-pay | Admitting: *Deleted

## 2015-06-01 DIAGNOSIS — F411 Generalized anxiety disorder: Secondary | ICD-10-CM

## 2015-06-01 MED ORDER — ALPRAZOLAM 0.5 MG PO TABS: 0.5000 mg | ORAL_TABLET | Freq: Two times a day (BID) | ORAL | Status: DC | PRN

## 2015-06-01 MED ORDER — GLIMEPIRIDE 4 MG PO TABS: 4.0000 mg | ORAL_TABLET | Freq: Two times a day (BID) | ORAL | Status: DC

## 2015-06-01 NOTE — Telephone Encounter (Signed)
Patient requesting refills on Xanax and Amaryl.  Last seen 04/13/15.  Follow up scheduled 06/29/15.  Patient states she is taking Amaryl bid, chart indicates once daily.  Please verify.

## 2015-06-02 NOTE — Telephone Encounter (Signed)
[facility] [place] [facility]

## 2015-06-22 ENCOUNTER — Ambulatory Visit
Admission: RE | Admit: 2015-06-22 | Discharge: 2015-06-22 | Disposition: A | Discharge status: Home / Self Care | Payer: Medicare PPO | Source: Ambulatory Visit | Attending: Primary Care | Admitting: Primary Care

## 2015-06-22 ENCOUNTER — Ambulatory Visit
Admission: RE | Admit: 2015-06-22 | Discharge: 2015-06-22 | Disposition: A | Discharge status: Home / Self Care | Payer: Medicare PPO | Source: Ambulatory Visit | Attending: Internal Medicine | Admitting: Internal Medicine

## 2015-06-22 ENCOUNTER — Encounter (INDEPENDENT_AMBULATORY_CARE_PROVIDER_SITE_OTHER): Payer: Self-pay

## 2015-06-22 DIAGNOSIS — Z13820 Encounter for screening for osteoporosis: Secondary | ICD-10-CM | POA: Insufficient documentation

## 2015-06-22 DIAGNOSIS — R928 Other abnormal and inconclusive findings on diagnostic imaging of breast: Secondary | ICD-10-CM

## 2015-06-22 DIAGNOSIS — N644 Mastodynia: Secondary | ICD-10-CM

## 2015-06-28 ENCOUNTER — Other Ambulatory Visit: Payer: Self-pay | Admitting: Primary Care

## 2015-06-29 ENCOUNTER — Encounter: Payer: Self-pay | Admitting: Primary Care

## 2015-06-29 ENCOUNTER — Telehealth: Payer: Self-pay | Admitting: *Deleted

## 2015-06-29 ENCOUNTER — Ambulatory Visit (INDEPENDENT_AMBULATORY_CARE_PROVIDER_SITE_OTHER): Payer: Medicare PPO | Admitting: Primary Care

## 2015-06-29 VITALS — BP 134/80 | HR 74 | Temp 97.6°F | Ht 67.0 in | Wt 275.4 lb

## 2015-06-29 DIAGNOSIS — F32A Depression, unspecified: Secondary | ICD-10-CM

## 2015-06-29 DIAGNOSIS — Z23 Encounter for immunization: Secondary | ICD-10-CM | POA: Diagnosis not present

## 2015-06-29 DIAGNOSIS — I10 Essential (primary) hypertension: Secondary | ICD-10-CM | POA: Diagnosis not present

## 2015-06-29 DIAGNOSIS — F418 Other specified anxiety disorders: Secondary | ICD-10-CM

## 2015-06-29 DIAGNOSIS — F329 Major depressive disorder, single episode, unspecified: Secondary | ICD-10-CM

## 2015-06-29 DIAGNOSIS — D649 Anemia, unspecified: Secondary | ICD-10-CM

## 2015-06-29 DIAGNOSIS — F419 Anxiety disorder, unspecified: Secondary | ICD-10-CM

## 2015-06-29 DIAGNOSIS — E039 Hypothyroidism, unspecified: Secondary | ICD-10-CM | POA: Diagnosis not present

## 2015-06-29 DIAGNOSIS — E119 Type 2 diabetes mellitus without complications: Secondary | ICD-10-CM

## 2015-06-29 LAB — CBC
HEMATOCRIT: 33.7 % — AB (ref 36.0–46.0)
Hemoglobin: 10.6 g/dL — ABNORMAL LOW (ref 12.0–15.0)
MCHC: 31.6 g/dL (ref 30.0–36.0)
MCV: 83.3 fl (ref 78.0–100.0)
PLATELETS: 302 10*3/uL (ref 150.0–400.0)
RBC: 4.04 Mil/uL (ref 3.87–5.11)
RDW: 15.6 % — ABNORMAL HIGH (ref 11.5–15.5)
WBC: 7.9 10*3/uL (ref 4.0–10.5)

## 2015-06-29 LAB — FERRITIN: FERRITIN: 7.9 ng/mL — AB (ref 10.0–291.0)

## 2015-06-29 LAB — IBC PANEL
IRON: 51 ug/dL (ref 42–145)
Saturation Ratios: 10.6 % — ABNORMAL LOW (ref 20.0–50.0)
Transferrin: 344 mg/dL (ref 212.0–360.0)

## 2015-06-29 LAB — HEMOGLOBIN A1C: HEMOGLOBIN A1C: 8 % — AB (ref 4.6–6.5)

## 2015-06-29 LAB — TSH: TSH: 2.96 u[IU]/mL (ref 0.35–4.50)

## 2015-06-29 NOTE — Telephone Encounter (Signed)
This is ready for pick up at her convenience.

## 2015-06-29 NOTE — Assessment & Plan Note (Addendum)
Took 1 week of Lexapro, stopped due to tiredness. Suggested she take 1/2 tablet daily to help with anxiety and social phobia. She continues to use Xanax PRN. She completed a UDS at pain management and will bring a copy to our office. Follow up in 3 months.

## 2015-06-29 NOTE — Assessment & Plan Note (Signed)
Managed on glimepiride, metformin, januvia. Discussed the importance of healthy diet and regular exercise. She is working to cut back sodas and decrease carbs. A1C due today and is pending. Follow up in 3 months.

## 2015-06-29 NOTE — Progress Notes (Signed)
Subjective:    Patient ID: Danielle Edwards, female    DOB: Nov 21, 1955, 59 y.o.   MRN: [id number]  HPI  Ms. Noyes is a 59 year old female who presents today for follow up.  1) Generalized Anxiety Disorder: Managed long term on alprazolam 0.5 mg PRN from prior PCP, especially when in large crowds due to anxiety and panic attacks. She was initiated on Lexapro 10 mg during last visit in July and was encouraged to follow up 4 weeks later. Since her last visit she has not been taking the Lexapro. She took 1 full tablet daily for 7 days and felt more fatigued and a lower energy level. She is using the Xanax as needed at night and when going out into public. She has not been able to connect with her therapist in [place]. She was referred to therapy within our practice and had difficulty coordinating with her schedule.   2) Essential Hypertension: Currently managed on amlodpine 10 mg, losartann 100 mg, and HCTZ 12.5 mg. Denies chest pain, headaches, shortness of breath.   BP Readings from Last 3 Encounters:  06/29/15 134/80  04/13/15 136/76  03/23/15 160/92      3) Hypothyroidism: TSH of 5.29 in July 2016. Initiated levothyroxine 25 mcg at that time. Due for recheck today. She's been taking the levothyroxine daily. Denies fatigue, hair loss, cold intolerance.  4) Diabetes type 2: Last A1C of 7.8 in June 2016. Currently managed on glimepiride 4 mg BID, Metformin 1000 BID, and Januvia 100 mg daily. A1C due today. She's reduced the consumption of sodas. She is working on a healthy diet and is going to start eating a lower amount of carbs. Some numbness/tingling to fingers and toes, no acute changed.  Review of Systems  Constitutional: Negative for fatigue.  Respiratory: Negative for shortness of breath.   Cardiovascular: Negative for chest pain.  Endocrine: Negative for cold intolerance.  Neurological: Negative for dizziness and headaches.       Occasional numbness/tingling to toes and fingers    Psychiatric/Behavioral:       Anxiety with large crowds and public places       Past Medical History  Diagnosis Date  . Headache   . Vitamin D deficiency   . Hypertension   . Anemia     Iron defiency   . Diabetes type 2, controlled   . Hypothyroidism   . Chronic pain   . IBS (irritable bowel syndrome)     Diarrhea type  . History of colonic polyps 1999    Benign  . Hx of adenomatous polyp of colon 09/27/2000    2001 - diminutive adenoma [place] 2007 no polyps - [place]    Social History   Social History  . Marital Status: Married    Spouse Name: N/A  . Number of Children: N/A  . Years of Education: N/A   Occupational History  . Not on file.   Social History Main Topics  . Smoking status: Never Smoker   . Smokeless tobacco: Not on file  . Alcohol Use: 0.0 oz/week    0 Standard drinks or equivalent per week     Comment: rarely  . Drug Use: Not on file  . Sexual Activity: Not on file   Other Topics Concern  . Not on file   Social History Narrative   Married.   3 children.   Retired, worked at [employer] in [place], [place].   Enjoys being with her grandchildren, being with her friends.  Past Surgical History  Procedure Laterality Date  . Vaginal hysterectomy      Complete  . Cholecystectomy  1995  . Tonsillectomy and adenoidectomy    . Nasal sinus surgery  2001  . Colonoscopy  multiple    Family History  Problem Relation Age of Onset  . Colon cancer Mother   . Heart disease Father   . Lung cancer Mother   . Diabetes Father   . Skin cancer Mother   . Skin cancer Father     No Known Allergies  Current Outpatient Prescriptions on File Prior to Visit  Medication Sig Dispense Refill  . ALPRAZolam (XANAX) 0.5 MG tablet Take 1 tablet (0.5 mg total) by mouth 2 (two) times daily as needed for anxiety. 60 tablet 0  . amLODipine (NORVASC) 10 MG tablet Take 1 tablet (10 mg total) by mouth daily. 30 tablet 5  . fluticasone (FLONASE) 50 MCG/ACT nasal  spray Place 2 sprays into both nostrils daily. 16 g 0  . glimepiride (AMARYL) 4 MG tablet Take 1 tablet (4 mg total) by mouth 2 (two) times daily. 60 tablet 5  . levothyroxine (LEVOTHROID) 25 MCG tablet Take 1 tablet (25 mcg total) by mouth daily before breakfast. 30 tablet 3  . loperamide (IMODIUM A-D) 2 MG tablet Take 2 mg by mouth 4 (four) times daily as needed for diarrhea or loose stools.    Marland Kitchen losartan (COZAAR) 100 MG tablet Take 1 tablet (100 mg total) by mouth daily. 30 tablet 5  . metFORMIN (GLUCOPHAGE) 1000 MG tablet Take 1,000 mg by mouth 2 (two) times daily with a meal.    . pantoprazole (PROTONIX) 40 MG tablet Take 1 tablet by mouth once daily for acid reflux 30 tablet 1  . sitaGLIPtin (JANUVIA) 100 MG tablet Take 1 tablet (100 mg total) by mouth daily. 30 tablet 2  . escitalopram (LEXAPRO) 10 MG tablet Take 1 tablet (10 mg total) by mouth daily. (Patient not taking: Reported on 06/29/2015) 30 tablet 3  . hydrochlorothiazide (HYDRODIURIL) 12.5 MG tablet Take 1 tablet (12.5 mg total) by mouth daily. (Patient not taking: Reported on 06/29/2015) 90 tablet 0   No current facility-administered medications on file prior to visit.    BP 134/80 mmHg  Pulse 74  Temp(Src) 97.6 F (36.4 C) (Oral)  Ht 5\' 7"  (1.702 m)  Wt 275 lb 6.4 oz (124.921 kg)  BMI 43.12 kg/m2  SpO2 96%    Objective:   Physical Exam  Constitutional: She is oriented to person, place, and time. She appears well-nourished.  Cardiovascular: Normal rate and regular rhythm.   Pulmonary/Chest: Effort normal and breath sounds normal.  Neurological: She is alert and oriented to person, place, and time.  Skin: Skin is warm and dry.  Psychiatric: She has a normal mood and affect.          Assessment & Plan:

## 2015-06-29 NOTE — Telephone Encounter (Signed)
Called patient and notified her that Rx is ready for pick up. Patient stated she will try to come tomorrow or have her son pick it up.

## 2015-06-29 NOTE — Telephone Encounter (Signed)
Patient left a voicemail that she was seen earlier today and checked with her insurance company and she needed a written script for the shingles vaccine to take to a pharmacy. Patient requested a call back when script is ready for pickup.

## 2015-06-29 NOTE — Progress Notes (Signed)
Pre visit review using our clinic review tool, if applicable. No additional management support is needed unless otherwise documented below in the visit note. 

## 2015-06-29 NOTE — Patient Instructions (Signed)
Restart taking Lexapro daily. Take 1/2 tablet by mouth daily for anxiety. Please let me know if this medication makes you feel drowsy.  Complete lab work prior to leaving today. I will notify you of your results.  Please bring me a copy of your urine drug screen from pain management.  Please schedule a follow up appointment in 3 months.  It was a pleasure to see you today!

## 2015-06-29 NOTE — Assessment & Plan Note (Signed)
Feeling better since initiation of levothyroxine. TSH due today and is pending.

## 2015-06-29 NOTE — Telephone Encounter (Signed)
Electronically refill request for   sitagliptin (JANUVIA) 100 MG tablet   Take 1 tablet (100 mg total) by mouth daily.  Dispense: 30 tablet   Refills: 2     Last prescribed on 03/25/2015. Last seen on 06/29/15. Next appt follow up on 09/21/15.

## 2015-06-29 NOTE — Assessment & Plan Note (Signed)
Stable today. Continue current regimen. 

## 2015-07-01 ENCOUNTER — Telehealth: Payer: Self-pay | Admitting: Primary Care

## 2015-07-01 NOTE — Telephone Encounter (Signed)
Place paperwork in [facility] for review.

## 2015-07-01 NOTE — Telephone Encounter (Signed)
Pt dropped off drug screening form that was requested during yesterdays appointment. Form on Chan's desk. Thank you.

## 2015-07-01 NOTE — Telephone Encounter (Signed)
Noted. She is not due for UDS until 03/2016. This was completed per pain management in June 2016.

## 2015-07-06 ENCOUNTER — Other Ambulatory Visit: Payer: Self-pay

## 2015-07-06 DIAGNOSIS — F411 Generalized anxiety disorder: Secondary | ICD-10-CM

## 2015-07-06 MED ORDER — ALPRAZOLAM 0.5 MG PO TABS: 0.5000 mg | ORAL_TABLET | Freq: Two times a day (BID) | ORAL | Status: DC | PRN

## 2015-07-06 NOTE — Telephone Encounter (Signed)
[facility]

## 2015-07-06 NOTE — Telephone Encounter (Signed)
Pt left v/m requesting refill xanax to [facility]. rx last refilled # 60 on 06/01/15 and last seen 06/29/15.

## 2015-07-06 NOTE — Telephone Encounter (Signed)
Message left for patient to return my call.  

## 2015-07-06 NOTE — Telephone Encounter (Signed)
Pt left v/m returning call and requesting cb. 

## 2015-07-06 NOTE — Telephone Encounter (Signed)
Called and ask patient if she has start taking Lexapro, yet. Patient stated that she have not. She wants to wait until this weekend.

## 2015-07-11 ENCOUNTER — Other Ambulatory Visit: Payer: Self-pay | Admitting: Primary Care

## 2015-07-13 NOTE — Telephone Encounter (Signed)
Electronically refill request for  hydrochlorothiazide (HYDRODIURIL) 12.5 MG tablet   Take 1 tablet (12.5 mg total) by mouth daily.  Dispense: 90 tablet   Refills: 0    Last prescribed on 03/23/2015. Last seen on 06/29/15. Follow up apt on 09/21/15.

## 2015-07-17 ENCOUNTER — Other Ambulatory Visit: Payer: Self-pay

## 2015-07-17 MED ORDER — METFORMIN HCL 1000 MG PO TABS: 1000.0000 mg | ORAL_TABLET | Freq: Two times a day (BID) | ORAL | Status: DC

## 2015-07-17 NOTE — Telephone Encounter (Signed)
Pt request refill metformin to [facility]; pt seen 06/29/15 and per lab result note pt needs to f/u 3 months, and if no improvement will discuss injectable med; pt advised would send 3 mth refill to [facility]. Pt voiced understanding and will keep appt in 09/2015.

## 2015-07-22 ENCOUNTER — Telehealth: Payer: Self-pay | Admitting: Primary Care

## 2015-07-22 NOTE — Telephone Encounter (Signed)
Will you please check on Danielle Edwards to see if she's started her Lexapro again?

## 2015-07-22 NOTE — Telephone Encounter (Signed)
Message left for patient to return my call.  

## 2015-07-23 NOTE — Telephone Encounter (Signed)
Message left for patient to return my call.  

## 2015-07-24 NOTE — Telephone Encounter (Signed)
Per Anda Kraft. Okay to stop attempt to call patient. We will ask patient on next office visit on 09/21/2015.

## 2015-07-28 ENCOUNTER — Other Ambulatory Visit: Payer: Self-pay | Admitting: Family Medicine

## 2015-07-29 ENCOUNTER — Other Ambulatory Visit: Payer: Self-pay | Admitting: Primary Care

## 2015-07-29 NOTE — Telephone Encounter (Signed)
Electronically refill request for   pantoprazole (PROTONIX) 20 MG tablet   Take 1 tablet by mouth once to twice daily for acid reflux.  Dispense: 60 tablet   Refills: 3    Last prescribed on 03/23/2015. Last seen on 06/29/2015. Follow up on 09/21/2015.

## 2015-08-03 ENCOUNTER — Ambulatory Visit (AMBULATORY_SURGERY_CENTER): Payer: Self-pay | Admitting: *Deleted

## 2015-08-03 VITALS — Ht 66.0 in | Wt 281.4 lb

## 2015-08-03 DIAGNOSIS — Z800 Family history of malignant neoplasm of digestive organs: Secondary | ICD-10-CM

## 2015-08-03 DIAGNOSIS — Z86010 Personal history of colonic polyps: Secondary | ICD-10-CM

## 2015-08-03 NOTE — Progress Notes (Signed)
Denies allergies to eggs or soy products. Denies complications with sedation or anesthesia. Denies O2 use. Denies use of diet or weight loss medications.  Emmi instructions given for colonoscopy.  

## 2015-08-12 ENCOUNTER — Encounter: Payer: Self-pay | Admitting: Internal Medicine

## 2015-08-17 ENCOUNTER — Ambulatory Visit (AMBULATORY_SURGERY_CENTER): Payer: Medicare PPO | Admitting: Internal Medicine

## 2015-08-17 ENCOUNTER — Other Ambulatory Visit: Payer: Self-pay | Admitting: Primary Care

## 2015-08-17 ENCOUNTER — Encounter: Payer: Self-pay | Admitting: Internal Medicine

## 2015-08-17 VITALS — BP 143/94 | HR 79 | Temp 96.2°F | Resp 18 | Ht 66.0 in | Wt 281.0 lb

## 2015-08-17 DIAGNOSIS — D122 Benign neoplasm of ascending colon: Secondary | ICD-10-CM

## 2015-08-17 DIAGNOSIS — D123 Benign neoplasm of transverse colon: Secondary | ICD-10-CM | POA: Diagnosis not present

## 2015-08-17 DIAGNOSIS — K589 Irritable bowel syndrome without diarrhea: Secondary | ICD-10-CM

## 2015-08-17 DIAGNOSIS — D124 Benign neoplasm of descending colon: Secondary | ICD-10-CM

## 2015-08-17 DIAGNOSIS — Z86010 Personal history of colonic polyps: Secondary | ICD-10-CM

## 2015-08-17 MED ORDER — SODIUM CHLORIDE 0.9 % IV SOLN: 500.0000 mL | INTRAVENOUS | Status: DC

## 2015-08-17 MED ORDER — ONDANSETRON HCL 4 MG PO TABS: 4.0000 mg | ORAL_TABLET | Freq: Three times a day (TID) | ORAL | Status: DC | PRN

## 2015-08-17 NOTE — Progress Notes (Signed)
Called to room to assist during endoscopic procedure.  Patient ID and intended procedure confirmed with present staff. Received instructions for my participation in the procedure from the performing physician.  

## 2015-08-17 NOTE — Progress Notes (Signed)
To recovery, report to Brown, RN, VSS. 

## 2015-08-17 NOTE — Op Note (Signed)
[facility]  [address] [place], [id number]   COLONOSCOPY PROCEDURE REPORT  PATIENT: Danielle Edwards, Danielle Edwards  MR#: [id number] BIRTHDATE: 1956/08/06 , 59  yrs. old GENDER: female ENDOSCOPIST: Gatha Mayer, MD, [facility] PROCEDURE DATE:  08/17/2015 PROCEDURE:   Colonoscopy, surveillance and Colonoscopy with snare polypectomy First Screening Colonoscopy - Avg.  risk and is 50 yrs.  old or older - No.  Prior Negative Screening - Now for repeat screening. N/A  History of Adenoma - Now for follow-up colonoscopy & has been > or = to 3 yrs.  Yes hx of adenoma.  Has been 3 or more years since last colonoscopy.  Polyps removed today? Yes ASA CLASS:   Class III INDICATIONS:Surveillance due to prior colonic neoplasia and PH Colon Adenoma. MEDICATIONS: Propofol 350 mg IV and Monitored anesthesia care  DESCRIPTION OF PROCEDURE:   After the risks benefits and alternatives of the procedure were thoroughly explained, informed consent was obtained.  The digital rectal exam revealed no abnormalities of the rectum.   The [id number]  endoscope was introduced through the anus and advanced to the terminal ileum which was intubated for a short distance. No adverse events experienced.   The quality of the prep was good.  (MiraLax was used)  The instrument was then slowly withdrawn as the colon was fully examined. Estimated blood loss is zero unless otherwise noted in this procedure report.  COLON FINDINGS: Three polypoid shaped sessile polyps ranging from 4 to 76mm in size were found in the descending colon, transverse colon, and ascending colon.  Polypectomies were performed with a cold snare (4 mm descending and 8 mm transverse) and using snare cautery (20 mm ascending poly).  The resection was complete, the polyp tissue was completely retrieved and sent to histology.   The examination was otherwise normal.   The examined terminal ileum appeared to be normal.  Retroflexed views  revealed no abnormalities. The time to cecum = 2.8 Withdrawal time = 12.7   The scope was withdrawn and the procedure completed. COMPLICATIONS: There were no immediate complications.  ENDOSCOPIC IMPRESSION: 1.   Three sessile polyps ranging from 4 to 20 mm in size were found in the descending colon, transverse colon, and ascending colon; polypectomies were performed with a cold snare and using snare cautery 2.   The examination was otherwise normal - good prep 3.   The examined terminal ileum appeared to be normal  RECOMMENDATIONS: 1.  Hold Aspirin and all other NSAIDS for 2 weeks. 2.  Timing of repeat colonoscopy will be determined by pathology findings. she has hx adenoma in 2001 and no polyps 2007. Mother w/ colon cancer ? what age 2.  She will call to arrange an OV re: IBS.  Am Rxing ondansetron 4 mg prn IBS diarrhea and nausea  eSigned:  Gatha Mayer, MD, [facility] 08/17/2015 11:14 AM  cc: The Patient and Alma Friendly, NP

## 2015-08-17 NOTE — Patient Instructions (Addendum)
I found and removed 3 polyps - all look benign.  All else ok.  I prescribed ondansetron to see if that slows down diarrhea from IBS. Please use that as needed when you get a spell - its ok to take 2 if 1 does not help.  You may call and arrange an office visit with me to review your IBS and try to help with that.  I appreciate the opportunity to care for you. Gatha Mayer, MD, FACG      YOU HAD AN ENDOSCOPIC PROCEDURE TODAY AT [facility]:   Refer to the procedure report that was given to you for any specific questions about what was found during the examination.  If the procedure report does not answer your questions, please call your gastroenterologist to clarify.  If you requested that your care partner not be given the details of your procedure findings, then the procedure report has been included in a sealed envelope for you to review at your convenience later.  YOU SHOULD EXPECT: Some feelings of bloating in the abdomen. Passage of more gas than usual.  Walking can help get rid of the air that was put into your GI tract during the procedure and reduce the bloating. If you had a lower endoscopy (such as a colonoscopy or flexible sigmoidoscopy) you may notice spotting of blood in your stool or on the toilet paper. If you underwent a bowel prep for your procedure, you may not have a normal bowel movement for a few days.  Please Note:  You might notice some irritation and congestion in your nose or some drainage.  This is from the oxygen used during your procedure.  There is no need for concern and it should clear up in a day or so.  SYMPTOMS TO REPORT IMMEDIATELY:   Following lower endoscopy (colonoscopy or flexible sigmoidoscopy):  Excessive amounts of blood in the stool  Significant tenderness or worsening of abdominal pains  Swelling of the abdomen that is new, acute  Fever of 100F or higher    For urgent or emergent issues, a gastroenterologist can  be reached at any hour by calling [phone].   DIET: Your first meal following the procedure should be a small meal and then it is ok to progress to your normal diet. Heavy or fried foods are harder to digest and may make you feel nauseous or bloated.  Likewise, meals heavy in dairy and vegetables can increase bloating.  Drink plenty of fluids but you should avoid alcoholic beverages for 24 hours.  ACTIVITY:  You should plan to take it easy for the rest of today and you should NOT DRIVE or use heavy machinery until tomorrow (because of the sedation medicines used during the test).    FOLLOW UP: Our staff will call the number listed on your records the next business day following your procedure to check on you and address any questions or concerns that you may have regarding the information given to you following your procedure. If we do not reach you, we will leave a message.  However, if you are feeling well and you are not experiencing any problems, there is no need to return our call.  We will assume that you have returned to your regular daily activities without incident.  If any biopsies were taken you will be contacted by phone or by letter within the next 1-3 weeks.  Please call us at [phone] if you have not heard about the  biopsies in 3 weeks.    SIGNATURES/CONFIDENTIALITY: You and/or your care partner have signed paperwork which will be entered into your electronic medical record.  These signatures attest to the fact that that the information above on your After Visit Summary has been reviewed and is understood.  Full responsibility of the confidentiality of this discharge information lies with you and/or your care-partner.    Hold Aspirin and all NSAIDS for 2 weeks,but resume remainder of medication. Information given on polyps.

## 2015-08-18 ENCOUNTER — Other Ambulatory Visit: Payer: Self-pay | Admitting: Primary Care

## 2015-08-18 ENCOUNTER — Telehealth: Payer: Self-pay | Admitting: *Deleted

## 2015-08-18 NOTE — Telephone Encounter (Signed)
Left message that we called for f/u 

## 2015-08-24 ENCOUNTER — Ambulatory Visit (INDEPENDENT_AMBULATORY_CARE_PROVIDER_SITE_OTHER): Payer: Medicare PPO | Admitting: Family Medicine

## 2015-08-24 ENCOUNTER — Encounter: Payer: Self-pay | Admitting: Family Medicine

## 2015-08-24 VITALS — BP 142/98 | HR 83 | Temp 98.4°F | Ht 67.0 in | Wt 273.1 lb

## 2015-08-24 DIAGNOSIS — R05 Cough: Secondary | ICD-10-CM | POA: Diagnosis not present

## 2015-08-24 DIAGNOSIS — R059 Cough, unspecified: Secondary | ICD-10-CM | POA: Insufficient documentation

## 2015-08-24 DIAGNOSIS — J019 Acute sinusitis, unspecified: Secondary | ICD-10-CM | POA: Diagnosis not present

## 2015-08-24 DIAGNOSIS — J329 Chronic sinusitis, unspecified: Secondary | ICD-10-CM | POA: Insufficient documentation

## 2015-08-24 MED ORDER — LEVOFLOXACIN 500 MG PO TABS: 500.0000 mg | ORAL_TABLET | Freq: Every day | ORAL | Status: DC

## 2015-08-24 MED ORDER — HYDROCODONE-HOMATROPINE 5-1.5 MG/5ML PO SYRP: 5.0000 mL | ORAL_SOLUTION | Freq: Three times a day (TID) | ORAL | Status: DC | PRN

## 2015-08-24 NOTE — Progress Notes (Signed)
Pre visit review using our clinic review tool, if applicable. No additional management support is needed unless otherwise documented below in the visit note. 

## 2015-08-24 NOTE — Progress Notes (Signed)
Subjective:  Patient ID: Danielle Edwards, female    DOB: 06-15-1956  Age: 59 y.o. MRN: [id number]  CC: Cough, sore throat, sinus pain and pressure, wheezing  HPI:  59 year old female with past medical history of hypertension and DM-2 presents to the clinic today with the above complaints  Patient reports that she's had the above symptoms for approximately 1.5 weeks. She states her cough is productive of dark discolored mucus. She reports intermittent wheezing. No associated fever. Does note chills. No exacerbating or relieving factors. Patient states that she has a "sinus infection" and states that this happens about this time every year.  Social Hx   Social History   Social History  . Marital Status: Married    Spouse Name: N/A  . Number of Children: N/A  . Years of Education: N/A   Social History Main Topics  . Smoking status: Never Smoker   . Smokeless tobacco: Never Used  . Alcohol Use: 0.0 oz/week    0 Standard drinks or equivalent per week     Comment: rarely  . Drug Use: No  . Sexual Activity: Not Asked   Other Topics Concern  . None   Social History Narrative   Married.   3 children.   Retired, worked at [employer] in [place], [place].   Enjoys being with her grandchildren, being with her friends.      Review of Systems  Constitutional: Positive for chills. Negative for fever.  HENT: Positive for sinus pressure and sore throat.   Respiratory: Positive for cough and wheezing.     Objective:  BP 142/98 mmHg  Pulse 83  Temp(Src) 98.4 F (36.9 C) (Oral)  Ht 5\' 7"  (1.702 m)  Wt 273 lb 2 oz (123.889 kg)  BMI 42.77 kg/m2  SpO2 96%  BP/Weight 08/24/2015 08/17/2015 [id number]  Systolic BP A999333 A999333 -  Diastolic BP 98 94 -  Wt. (Lbs) 273.13 281 281.4  BMI 42.77 45.38 45.44    Physical Exam  Constitutional: She appears well-developed. No distress.  HENT:  Head: Normocephalic and atraumatic.  Mouth/Throat: Oropharynx is clear and moist. No oropharyngeal exudate.    Mild maxillary sinus tenderness. Normal TMs bilaterally.   Neck: Neck supple.  Cardiovascular: Normal rate and regular rhythm.   Pulmonary/Chest: Effort normal.  Left basilar rales.  Neurological: She is alert.  Vitals reviewed.  Lab Results  Component Value Date   WBC 7.9 06/29/2015   HGB 10.6* 06/29/2015   HCT 33.7* 06/29/2015   PLT 302.0 06/29/2015   GLUCOSE 137* 03/23/2015   CHOL 204* 04/07/2015   TRIG 176.0* 04/07/2015   HDL 39.60 04/07/2015   LDLCALC 129* 04/07/2015   ALT 27 04/07/2015   AST 22 04/07/2015   NA 137 03/23/2015   K 4.5 03/23/2015   CL 106 03/23/2015   CREATININE 0.97 03/23/2015   BUN 22 03/23/2015   CO2 26 03/23/2015   TSH 2.96 06/29/2015   HGBA1C 8.0* 06/29/2015   MICROALBUR 2.0* 04/07/2015    Assessment & Plan:   Problem List Items Addressed This Visit    Sinusitis - Primary    Treating with Levaquin.      Relevant Medications   HYDROcodone-homatropine (HYCODAN) 5-1.5 MG/5ML syrup   levofloxacin (LEVAQUIN) 500 MG tablet   Cough    Obtaining chest x-ray to rule out infiltrate. Treating with Hycodan and Tessalon.      Relevant Medications   HYDROcodone-homatropine (HYCODAN) 5-1.5 MG/5ML syrup   Other Relevant Orders   DG Chest 2 View  Meds ordered this encounter  Medications  . HYDROcodone-homatropine (HYCODAN) 5-1.5 MG/5ML syrup    Sig: Take 5 mLs by mouth every 8 (eight) hours as needed for cough.    Dispense:  120 mL    Refill:  0  . levofloxacin (LEVAQUIN) 500 MG tablet    Sig: Take 1 tablet (500 mg total) by mouth daily.    Dispense:  7 tablet    Refill:  0    Follow-up: PRN  Thersa Salt, DO

## 2015-08-24 NOTE — Patient Instructions (Signed)
Please get the chest xray.  Take the antibiotic as prescribed.  Use the cough syrup primarily at night.   Follow-up if he fails to improve or worsen.  Take care  Dr. Lacinda Axon

## 2015-08-24 NOTE — Assessment & Plan Note (Signed)
Treating with Levaquin.

## 2015-08-24 NOTE — Assessment & Plan Note (Signed)
Obtaining chest x-ray to rule out infiltrate. Treating with Hycodan and Tessalon.

## 2015-08-24 NOTE — Assessment & Plan Note (Deleted)
Obtaining xray to assess for underlying pneumonia. Given duration of illness treating empirically with Levaquin. Hycodan & tessalon for cough.

## 2015-08-25 ENCOUNTER — Ambulatory Visit (INDEPENDENT_AMBULATORY_CARE_PROVIDER_SITE_OTHER)
Admission: RE | Admit: 2015-08-25 | Discharge: 2015-08-25 | Disposition: A | Discharge status: Home / Self Care | Payer: Medicare PPO | Source: Ambulatory Visit | Attending: Family Medicine | Admitting: Family Medicine

## 2015-08-25 DIAGNOSIS — R05 Cough: Secondary | ICD-10-CM

## 2015-08-25 DIAGNOSIS — R059 Cough, unspecified: Secondary | ICD-10-CM

## 2015-08-30 ENCOUNTER — Encounter: Payer: Self-pay | Admitting: Internal Medicine

## 2015-08-30 DIAGNOSIS — Z86010 Personal history of colonic polyps: Secondary | ICD-10-CM

## 2015-08-30 NOTE — Progress Notes (Signed)
Quick Note:  3 adenomas < 1 cm Recall 2019-20 ______

## 2015-08-31 LAB — HM DIABETES EYE EXAM

## 2015-09-21 ENCOUNTER — Ambulatory Visit (INDEPENDENT_AMBULATORY_CARE_PROVIDER_SITE_OTHER): Payer: Medicare PPO | Admitting: Primary Care

## 2015-09-21 ENCOUNTER — Encounter: Payer: Self-pay | Admitting: Primary Care

## 2015-09-21 VITALS — BP 136/74 | HR 72 | Temp 98.2°F | Ht 67.0 in | Wt 270.8 lb

## 2015-09-21 DIAGNOSIS — F419 Anxiety disorder, unspecified: Secondary | ICD-10-CM

## 2015-09-21 DIAGNOSIS — J329 Chronic sinusitis, unspecified: Secondary | ICD-10-CM | POA: Diagnosis not present

## 2015-09-21 DIAGNOSIS — F411 Generalized anxiety disorder: Secondary | ICD-10-CM

## 2015-09-21 DIAGNOSIS — E119 Type 2 diabetes mellitus without complications: Secondary | ICD-10-CM | POA: Diagnosis not present

## 2015-09-21 DIAGNOSIS — I10 Essential (primary) hypertension: Secondary | ICD-10-CM | POA: Diagnosis not present

## 2015-09-21 DIAGNOSIS — F32A Depression, unspecified: Secondary | ICD-10-CM

## 2015-09-21 DIAGNOSIS — F329 Major depressive disorder, single episode, unspecified: Secondary | ICD-10-CM

## 2015-09-21 DIAGNOSIS — F418 Other specified anxiety disorders: Secondary | ICD-10-CM

## 2015-09-21 MED ORDER — VENLAFAXINE HCL ER 37.5 MG PO CP24: 37.5000 mg | ORAL_CAPSULE | Freq: Every day | ORAL | Status: DC

## 2015-09-21 MED ORDER — AMOXICILLIN-POT CLAVULANATE 875-125 MG PO TABS: 1.0000 | ORAL_TABLET | Freq: Two times a day (BID) | ORAL | Status: DC

## 2015-09-21 MED ORDER — ALPRAZOLAM 0.5 MG PO TABS: 0.5000 mg | ORAL_TABLET | Freq: Two times a day (BID) | ORAL | Status: DC | PRN

## 2015-09-21 NOTE — Assessment & Plan Note (Signed)
Increase in sinus pressure, cough, nasal congestion.  Treated for sinusitis in late November, doesn't feel as though it completely dissipated.  Moderate tenderness to sinuses, blowing yellow/green mucous from nasal cavity. Will try course of Augmentin to ensure infection is resolved. If no resolve/improvement, will consider sending to ENT.

## 2015-09-21 NOTE — Progress Notes (Signed)
Subjective:    Patient ID: Danielle Edwards, female    DOB: 08-Nov-1955, 59 y.o.   MRN: [id number]  HPI  [name] is a 59 year old female who presents today for follow up.  1) Anxiety and Depression: Prescribed Lexapro for patient several months ago. During last visit she endorsed taking her lexapro for 1 week but stopped due to feeling tired. She was to start with 1/2 tablet, but did not. Last visit we had her restart Lexapro at 1/2 tablet daily. She also uses Xanax which she refuses to wean off.   Since her last visit she has not taken her Lexapro as she didn't like the way she felt. She will take her Xanax three days weekly on average, (public places, at bedtime). Denies SI/HI. GAD 7 score of 11 today.  2) Type 2 Diabetes: Currently managed on Glimepiride, Metformin, Januvia. A1C of 8.0 in September 2016 and is due in late December 2016. She was contacted from [facility] to participate in a study to monitor and help managed diabetes through diet and exercise. The study is planned to start January 2017. They will monitor her A1C and will draw in January 2017. She struggles with her diet and believes this study to be beneficial.    3) Essential Hypertension: Currently managed on amlodipine 10 mg, losartan 100 mg, and HCTZ 12.5 mg. Denies chest pain, shortness of breath. She is due to see her cardiologist in [place] and is working to find one closer. She completed a stress test and echocardiogram in the past, which was normal per patient.   4) Sinus Pressure: Sinus pressure, nasal drainage x 4 days. She is blowing and coughing up thick, yellow mucous. She also reports fatigue, chills, sore throat, cough. She was treated for sinusitis on 08/24/15 with levaquin, completed her course, she felt improved for a period of time, but didn't feel 100%. She doesn't believe the sinusitis was completely cured. Denies fevers.  Review of Systems  Constitutional: Positive for chills. Negative for fever.  HENT:  Positive for congestion, rhinorrhea, sinus pressure and sore throat.   Respiratory: Positive for cough. Negative for shortness of breath.   Cardiovascular: Negative for chest pain.  Neurological: Negative for numbness.  Psychiatric/Behavioral: Positive for sleep disturbance. Negative for suicidal ideas. The patient is nervous/anxious.        Past Medical History  Diagnosis Date  . Headache   . Vitamin D deficiency   . Hypertension   . Anemia     Iron defiency   . Diabetes type 2, controlled ([place])   . Hypothyroidism   . Chronic pain   . IBS (irritable bowel syndrome)     Diarrhea type  . History of colonic polyps 1999    Benign  . Hx of adenomatous polyp of colon 09/27/2000    2001 - diminutive adenoma [place] 2007 no polyps - [place]    Social History   Social History  . Marital Status: Married    Spouse Name: N/A  . Number of Children: N/A  . Years of Education: N/A   Occupational History  . Not on file.   Social History Main Topics  . Smoking status: Never Smoker   . Smokeless tobacco: Never Used  . Alcohol Use: 0.0 oz/week    0 Standard drinks or equivalent per week     Comment: rarely  . Drug Use: No  . Sexual Activity: Not on file   Other Topics Concern  . Not on file  Social History Narrative   Married.   3 children.   Retired, worked at [employer] in [place], [place].   Enjoys being with her grandchildren, being with her friends.       Past Surgical History  Procedure Laterality Date  . Vaginal hysterectomy      Complete  . Cholecystectomy  1995  . Tonsillectomy and adenoidectomy    . Nasal sinus surgery  2001  . Colonoscopy  multiple    Family History  Problem Relation Age of Onset  . Colon cancer Mother   . Lung cancer Mother   . Skin cancer Mother   . Heart disease Father   . Diabetes Father   . Skin cancer Father     No Known Allergies  Current Outpatient Prescriptions on File Prior to Visit  Medication Sig Dispense Refill  .  amLODipine (NORVASC) 10 MG tablet TAKE 1 TABLET BY MOUTH DAILY 30 tablet 9  . carisoprodol (SOMA) 350 MG tablet Take 350 mg by mouth 2 (two) times daily.  1  . glimepiride (AMARYL) 4 MG tablet Take 1 tablet (4 mg total) by mouth 2 (two) times daily. 60 tablet 5  . JANUVIA 100 MG tablet TAKE 1 TABLET (100 MG TOTAL) BY MOUTH DAILY. 30 tablet 5  . loperamide (IMODIUM A-D) 2 MG tablet Take 2 mg by mouth 4 (four) times daily as needed for diarrhea or loose stools.    Marland Kitchen losartan (COZAAR) 100 MG tablet TAKE 1 TABLET BY MOUTH DAILY 90 tablet 3  . metFORMIN (GLUCOPHAGE) 1000 MG tablet Take 1 tablet (1,000 mg total) by mouth 2 (two) times daily with a meal. 180 tablet 0  . ondansetron (ZOFRAN) 4 MG tablet Take 1 tablet (4 mg total) by mouth every 8 (eight) hours as needed for nausea or vomiting (diarrhea). 30 tablet 1  . pantoprazole (PROTONIX) 40 MG tablet TAKE 1 TABLET BY MOUTH ONCE DAILY FOR ACID REFLUX 30 tablet 6  . fluticasone (FLONASE) 50 MCG/ACT nasal spray Place 2 sprays into both nostrils daily. (Patient not taking: Reported on 09/21/2015) 16 g 0  . hydrochlorothiazide (MICROZIDE) 12.5 MG capsule TAKE 1 CAPSULE (12.5 MG TOTAL) BY MOUTH DAILY. (Patient not taking: Reported on 09/21/2015) 90 capsule 0  . HYDROcodone-acetaminophen (NORCO) 10-325 MG tablet Reported on 09/21/2015  0  . levothyroxine (LEVOTHROID) 25 MCG tablet Take 1 tablet (25 mcg total) by mouth daily before breakfast. (Patient not taking: Reported on 09/21/2015) 30 tablet 3  . promethazine (PHENERGAN) 25 MG tablet Take 25 mg by mouth 4 (four) times daily. Reported on 09/21/2015  1   No current facility-administered medications on file prior to visit.    BP 136/74 mmHg  Pulse 72  Temp(Src) 98.2 F (36.8 C) (Oral)  Ht 5\' 7"  (1.702 m)  Wt 270 lb 12.8 oz (122.834 kg)  BMI 42.40 kg/m2  SpO2 98%    Objective:   Physical Exam  Constitutional: She appears well-nourished.  HENT:  Right Ear: Tympanic membrane and ear canal  normal.  Left Ear: Tympanic membrane and ear canal normal.  Nose: Right sinus exhibits maxillary sinus tenderness and frontal sinus tenderness. Left sinus exhibits maxillary sinus tenderness and frontal sinus tenderness.  Mouth/Throat: Posterior oropharyngeal erythema present. No oropharyngeal exudate or posterior oropharyngeal edema.  Eyes: Conjunctivae are normal. Pupils are equal, round, and reactive to light.  Neck: Neck supple.  Cardiovascular: Normal rate and regular rhythm.   Pulmonary/Chest: Effort normal and breath sounds normal.  Skin: Skin is warm and dry.  Psychiatric: She has a normal mood and affect.          Assessment & Plan:

## 2015-09-21 NOTE — Patient Instructions (Addendum)
Start Augmentin antibiotics. Take 1 tablet by mouth twice daily for 10 days.  Start Venlafaxine (Effexor XR). Take 1 tablet by mouth every morning with breakfast for anxiety.  Refills have been provided for your Alprazolam.  Please call me if your study does not start by mid January as we will need to check in on your A1C.  Follow up in 6 weeks for re-evaluation of anxiety with the Venlafaxine.  It was a pleasure to see you today!

## 2015-09-21 NOTE — Assessment & Plan Note (Signed)
Stable, continue current regimen 

## 2015-09-21 NOTE — Progress Notes (Signed)
Pre visit review using our clinic review tool, if applicable. No additional management support is needed unless otherwise documented below in the visit note. 

## 2015-09-21 NOTE — Assessment & Plan Note (Signed)
Stopped taking Lexapro, didn't like the way she felt. Currently requiring Xanax 3-4 times weekly/ GAD 7 score of 11 today. Will trial low dose Effexor and have her follow up in 6 weeks.

## 2015-09-21 NOTE — Assessment & Plan Note (Signed)
A1C of 8.0 in late September. She is to start participating in a diabetes wellness study in January 2017 and will get A1C checked at that time. High encouraged her to participate as this will help her gain control with diet and exercise. Will check A1C if she has not started study in mid January. Continue current regimen.

## 2015-10-07 ENCOUNTER — Telehealth: Payer: Self-pay | Admitting: Primary Care

## 2015-10-07 DIAGNOSIS — B379 Candidiasis, unspecified: Secondary | ICD-10-CM

## 2015-10-07 DIAGNOSIS — T3695XA Adverse effect of unspecified systemic antibiotic, initial encounter: Principal | ICD-10-CM

## 2015-10-07 MED ORDER — FLUCONAZOLE 150 MG PO TABS: 150.0000 mg | ORAL_TABLET | Freq: Once | ORAL | Status: DC

## 2015-10-07 NOTE — Telephone Encounter (Signed)
Tried to call patient and could not leave message since voicemail box is full 

## 2015-10-07 NOTE — Telephone Encounter (Signed)
Sent in diflucan. She will take one tablet by mouth once.

## 2015-10-07 NOTE — Telephone Encounter (Signed)
Pt called. Stated she has developed a yeast inf from abx. Would like med called in for infection. Please advise  [facility]  [phone]

## 2015-10-07 NOTE — Telephone Encounter (Signed)
Called and notified patient of [name]'s comments. Patient verbalized understanding.  

## 2015-10-10 ENCOUNTER — Other Ambulatory Visit: Payer: Self-pay | Admitting: Primary Care

## 2015-10-12 NOTE — Telephone Encounter (Signed)
Will you ask Danielle Edwards if she started the diabetic study through [facility]? If not we need to get her scheduled for a lab only appointment to watch her A1C.

## 2015-10-12 NOTE — Telephone Encounter (Signed)
I contacted patient. She states that she was told that she would receive a phone call in January about the diabetic study, and has not heard from anyone yet. Lab appt scheduled for 10/25/14.

## 2015-10-16 ENCOUNTER — Other Ambulatory Visit: Payer: Self-pay | Admitting: Primary Care

## 2015-10-16 NOTE — Telephone Encounter (Signed)
Electronically refill request for   metFORMIN (GLUCOPHAGE) 1000 MG tablet   Take 1 tablet (1,000 mg total) by mouth 2 (two) times daily with a meal.  Dispense: 180 tablet   Refills: 0     Last prescribed on 07/17/2015. Last seen on 09/21/2015. Follow up on 11/02/2015.

## 2015-10-22 ENCOUNTER — Other Ambulatory Visit: Payer: Self-pay | Admitting: Primary Care

## 2015-10-22 DIAGNOSIS — E559 Vitamin D deficiency, unspecified: Secondary | ICD-10-CM

## 2015-10-22 DIAGNOSIS — E785 Hyperlipidemia, unspecified: Secondary | ICD-10-CM

## 2015-10-22 DIAGNOSIS — E119 Type 2 diabetes mellitus without complications: Secondary | ICD-10-CM

## 2015-10-26 ENCOUNTER — Other Ambulatory Visit: Payer: Medicare PPO

## 2015-10-30 ENCOUNTER — Ambulatory Visit (INDEPENDENT_AMBULATORY_CARE_PROVIDER_SITE_OTHER): Payer: Medicare PPO | Admitting: Family Medicine

## 2015-10-30 ENCOUNTER — Encounter: Payer: Self-pay | Admitting: Family Medicine

## 2015-10-30 VITALS — BP 138/82 | HR 81 | Temp 98.3°F | Ht 67.0 in | Wt 274.0 lb

## 2015-10-30 DIAGNOSIS — R05 Cough: Secondary | ICD-10-CM | POA: Diagnosis not present

## 2015-10-30 DIAGNOSIS — J329 Chronic sinusitis, unspecified: Secondary | ICD-10-CM

## 2015-10-30 DIAGNOSIS — R059 Cough, unspecified: Secondary | ICD-10-CM

## 2015-10-30 DIAGNOSIS — R051 Acute cough: Secondary | ICD-10-CM | POA: Insufficient documentation

## 2015-10-30 MED ORDER — HYDROCOD POLST-CPM POLST ER 10-8 MG/5ML PO SUER: 5.0000 mL | Freq: Two times a day (BID) | ORAL | Status: DC | PRN

## 2015-10-30 NOTE — Assessment & Plan Note (Signed)
Patient with symptoms of sinus infection. She been treated twice for this recently. Referring to ENT.

## 2015-10-30 NOTE — Progress Notes (Signed)
Subjective:  Patient ID: Danielle Edwards, female    DOB: 09-13-1956  Age: 60 y.o. MRN: [id number]  CC: Sinus pressure/pain, Cough, Sore throat  HPI:  60 year old female with a past medical history of hypertension, DM 2, hypothyroidism, morbid obesity presents to clinic today with the above complaints.  Patient states that she has felt poorly for the past 4 days. She's been experiencing sinus pressure/pain, severe cough, and sore throat. Cough is productive of discolored sputum. She's also having discolored mucus from her nose. No associated fever. She has felt hot. No exacerbating or relieving factors.  Of note, she has been seen twice since November with similar complaints. She been treated for acute sinusitis on both occasions.  Social Hx   Social History   Social History  . Marital Status: Married    Spouse Name: N/A  . Number of Children: N/A  . Years of Education: N/A   Social History Main Topics  . Smoking status: Never Smoker   . Smokeless tobacco: Never Used  . Alcohol Use: 0.0 oz/week    0 Standard drinks or equivalent per week     Comment: rarely  . Drug Use: No  . Sexual Activity: Not Asked   Other Topics Concern  . None   Social History Narrative   Married.   3 children.   Retired, worked at [employer] in [place], [place].   Enjoys being with her grandchildren, being with her friends.      Review of Systems  Constitutional: Negative for fever and chills.  HENT: Positive for congestion, sinus pressure and sore throat.   Respiratory: Positive for cough.    Objective:  BP 138/82 mmHg  Pulse 81  Temp(Src) 98.3 F (36.8 C) (Oral)  Ht 5\' 7"  (1.702 m)  Wt 274 lb (124.286 kg)  BMI 42.90 kg/m2  SpO2 95%  BP/Weight 10/30/2015 09/21/2015 123456  Systolic BP 0000000 [id number] A999333  Diastolic BP 82 74 98  Wt. (Lbs) 274 270.8 273.13  BMI 42.9 42.4 42.77   Physical Exam  Constitutional: She appears well-developed. No distress.  Morbidly obese.  HENT:  Head:  Normocephalic and atraumatic.  Nose: Nose normal.  Mouth/Throat: Oropharynx is clear and moist. No oropharyngeal exudate.  Normal TMs bilaterally. No significant sinus tenderness.  Neck: Neck supple.  Cardiovascular: Normal rate and regular rhythm.   Pulmonary/Chest: Effort normal and breath sounds normal.  Lymphadenopathy:    She has no cervical adenopathy.  Neurological: She is alert.  Psychiatric:  Flat affect.   Vitals reviewed.  Lab Results  Component Value Date   WBC 7.9 06/29/2015   HGB 10.6* 06/29/2015   HCT 33.7* 06/29/2015   PLT 302.0 06/29/2015   GLUCOSE 137* 03/23/2015   CHOL 204* 04/07/2015   TRIG 176.0* 04/07/2015   HDL 39.60 04/07/2015   LDLCALC 129* 04/07/2015   ALT 27 04/07/2015   AST 22 04/07/2015   NA 137 03/23/2015   K 4.5 03/23/2015   CL 106 03/23/2015   CREATININE 0.97 03/23/2015   BUN 22 03/23/2015   CO2 26 03/23/2015   TSH 2.96 06/29/2015   HGBA1C 8.0* 06/29/2015   MICROALBUR 2.0* 04/07/2015    Assessment & Plan:   Problem List Items Addressed This Visit    Cough    New problem. Treating with Tussionex.      Recurrent sinusitis - Primary    Patient with symptoms of sinus infection. She been treated twice for this recently. Referring to ENT.      Relevant Medications  chlorpheniramine-HYDROcodone ([place] ER) 10-8 MG/5ML SUER   Other Relevant Orders   Ambulatory referral to ENT      Meds ordered this encounter  Medications  . chlorpheniramine-HYDROcodone (TUSSIONEX PENNKINETIC ER) 10-8 MG/5ML SUER    Sig: Take 5 mLs by mouth every 12 (twelve) hours as needed.    Dispense:  115 mL    Refill:  0    Follow-up: PRN  [facility]

## 2015-10-30 NOTE — Assessment & Plan Note (Signed)
New problem. Treating with Tussionex.

## 2015-10-30 NOTE — Progress Notes (Signed)
Pre visit review using our clinic review tool, if applicable. No additional management support is needed unless otherwise documented below in the visit note. 

## 2015-10-30 NOTE — Patient Instructions (Signed)
Use the cough medication as needed.  Continue supportive care.  We will have you see ENT as soon as we can.  Take care  Dr. Lacinda Axon

## 2015-11-02 ENCOUNTER — Ambulatory Visit: Payer: Medicare PPO | Admitting: Primary Care

## 2015-11-30 ENCOUNTER — Other Ambulatory Visit: Payer: Self-pay | Admitting: Primary Care

## 2015-11-30 NOTE — Telephone Encounter (Signed)
Electronically refill request for   pantoprazole (PROTONIX) 20 MG tablet   TAKE 1 TABLET BY MOUTH ONCE TO TWICE DAILY FOR ACID REFLUX.  Dispense: 60 tablet   Refills: 3     Last prescribed on 07/29/2015. Last seen on 10/30/2015. No future appointment.

## 2016-01-06 ENCOUNTER — Telehealth: Payer: Self-pay | Admitting: Primary Care

## 2016-01-06 DIAGNOSIS — E119 Type 2 diabetes mellitus without complications: Secondary | ICD-10-CM

## 2016-01-06 NOTE — Telephone Encounter (Signed)
Electronically refill request for   JANUVIA 100 MG tablet   TAKE 1 TABLET (100 MG TOTAL) BY MOUTH DAILY.  Dispense: 30 tablet   Refills: 5     Last prescribed on 06/29/2015   glimepiride (AMARYL) 4 MG tablet   ?Take 1 tablet (4 mg total) by mouth 2 (two) times daily.  Dispense: 60 tablet   Refills: 5     Last prescribed on 06/01/2015          Last seen on 09/21/2015. No future appointment.

## 2016-01-07 NOTE — Telephone Encounter (Signed)
Message left for patient to return my call.  

## 2016-01-11 ENCOUNTER — Telehealth: Payer: Self-pay | Admitting: Primary Care

## 2016-01-11 NOTE — Telephone Encounter (Signed)
Pt returned your call-please call back  

## 2016-01-11 NOTE — Telephone Encounter (Signed)
error 

## 2016-01-11 NOTE — Telephone Encounter (Addendum)
Called patient back. Asked patient regarding [name]'s comments below  Please call Ms. Evitt and ask her if she's participating in the diabetes program we discussed months ago. I need an updated A1C, so if she's not participating, then she will need an office visit for further refills. Please schedule.  Patient stated that she did not participated because she could not get in contact with someone regarding the program. Then went ahead and schedule follow up for patient on 01/18/2016.

## 2016-01-12 ENCOUNTER — Other Ambulatory Visit: Payer: Self-pay | Admitting: Primary Care

## 2016-01-12 DIAGNOSIS — E119 Type 2 diabetes mellitus without complications: Secondary | ICD-10-CM

## 2016-01-12 NOTE — Telephone Encounter (Signed)
Electronically refill request for   metFORMIN (GLUCOPHAGE) 1000 MG tablet   TAKE 1 TABLET (1,000 MG TOTAL) BY MOUTH 2 (TWO) TIMES DAILY WITH A MEAL.  Dispense: 180 tablet   Refills: 0     Last prescribed on 10/16/2015. Last seen on 09/21/2015. Follow up on 01/18/2016

## 2016-01-18 ENCOUNTER — Ambulatory Visit (INDEPENDENT_AMBULATORY_CARE_PROVIDER_SITE_OTHER): Payer: Medicare PPO | Admitting: Primary Care

## 2016-01-18 ENCOUNTER — Encounter: Payer: Self-pay | Admitting: Primary Care

## 2016-01-18 VITALS — BP 136/78 | HR 82 | Temp 97.7°F | Ht 67.0 in | Wt 268.8 lb

## 2016-01-18 DIAGNOSIS — E785 Hyperlipidemia, unspecified: Secondary | ICD-10-CM | POA: Diagnosis not present

## 2016-01-18 DIAGNOSIS — F418 Other specified anxiety disorders: Secondary | ICD-10-CM

## 2016-01-18 DIAGNOSIS — F411 Generalized anxiety disorder: Secondary | ICD-10-CM

## 2016-01-18 DIAGNOSIS — E119 Type 2 diabetes mellitus without complications: Secondary | ICD-10-CM

## 2016-01-18 DIAGNOSIS — F32A Depression, unspecified: Secondary | ICD-10-CM

## 2016-01-18 DIAGNOSIS — K219 Gastro-esophageal reflux disease without esophagitis: Secondary | ICD-10-CM | POA: Diagnosis not present

## 2016-01-18 DIAGNOSIS — F329 Major depressive disorder, single episode, unspecified: Secondary | ICD-10-CM

## 2016-01-18 DIAGNOSIS — R112 Nausea with vomiting, unspecified: Secondary | ICD-10-CM | POA: Diagnosis not present

## 2016-01-18 DIAGNOSIS — F419 Anxiety disorder, unspecified: Secondary | ICD-10-CM

## 2016-01-18 DIAGNOSIS — I10 Essential (primary) hypertension: Secondary | ICD-10-CM

## 2016-01-18 LAB — LIPID PANEL
CHOLESTEROL: 213 mg/dL — AB (ref 0–200)
HDL: 44 mg/dL (ref >39.00)
LDL CALC: 130 mg/dL — AB (ref 0–99)
NonHDL: 168.87
TRIGLYCERIDES: 193 mg/dL — AB (ref 0.0–149.0)
Total CHOL/HDL Ratio: 5
VLDL: 38.6 mg/dL (ref 0.0–40.0)

## 2016-01-18 LAB — COMPREHENSIVE METABOLIC PANEL
ALT: 25 U/L (ref 0–35)
AST: 24 U/L (ref 0–37)
Albumin: 3.9 g/dL (ref 3.5–5.2)
Alkaline Phosphatase: 70 U/L (ref 39–117)
BILIRUBIN TOTAL: 0.2 mg/dL (ref 0.2–1.2)
BUN: 23 mg/dL (ref 6–23)
CALCIUM: 9.5 mg/dL (ref 8.4–10.5)
CO2: 23 meq/L (ref 19–32)
Chloride: 106 mEq/L (ref 96–112)
Creatinine, Ser: 1.11 mg/dL (ref 0.40–1.20)
GFR: 53.15 mL/min — AB (ref >60.00)
GLUCOSE: 98 mg/dL (ref 70–99)
POTASSIUM: 4.3 meq/L (ref 3.5–5.1)
Sodium: 139 mEq/L (ref 135–145)
Total Protein: 7.6 g/dL (ref 6.0–8.3)

## 2016-01-18 LAB — HEMOGLOBIN A1C: Hgb A1c MFr Bld: 7.4 % — ABNORMAL HIGH (ref 4.6–6.5)

## 2016-01-18 MED ORDER — VENLAFAXINE HCL ER 37.5 MG PO CP24: 37.5000 mg | ORAL_CAPSULE | Freq: Every day | ORAL | Status: DC

## 2016-01-18 MED ORDER — ONDANSETRON HCL 4 MG PO TABS: 4.0000 mg | ORAL_TABLET | Freq: Three times a day (TID) | ORAL | Status: DC | PRN

## 2016-01-18 MED ORDER — ALPRAZOLAM 0.5 MG PO TABS: 0.5000 mg | ORAL_TABLET | Freq: Two times a day (BID) | ORAL | Status: DC | PRN

## 2016-01-18 MED ORDER — PANTOPRAZOLE SODIUM 40 MG PO TBEC: 40.0000 mg | DELAYED_RELEASE_TABLET | Freq: Every day | ORAL | Status: DC

## 2016-01-18 MED ORDER — SITAGLIPTIN PHOSPHATE 100 MG PO TABS: 100.0000 mg | ORAL_TABLET | Freq: Every day | ORAL | Status: DC

## 2016-01-18 NOTE — Assessment & Plan Note (Signed)
Lipids pending today, if above goal will initiate statin.

## 2016-01-18 NOTE — Progress Notes (Signed)
Pre visit review using our clinic review tool, if applicable. No additional management support is needed unless otherwise documented below in the visit note. 

## 2016-01-18 NOTE — Assessment & Plan Note (Signed)
Stable  Continue current regimen  

## 2016-01-18 NOTE — Assessment & Plan Note (Signed)
Switch to Pantoprazole 40 mg rather than 20 BID. Experiences esophageal reflux without meds. Discussed importance of weight loss.

## 2016-01-18 NOTE — Assessment & Plan Note (Signed)
Last A1C of 8.0 in December, due today for re-check. A1C pending. Will add statin if lipids above goal. Referral placed to diabetic nutritionist. Weight loss of 6 pounds since last visit, commended her on this. Managed on ARB.  Discussed that she needs to control her diet and lose weight as she's already on 3 oral medications and will likely need long acting insulin next. She does not wish to start insulin, and is now motivated.  Follow up this summer during her annual physical.

## 2016-01-18 NOTE — Assessment & Plan Note (Signed)
Improved on Effexor XR 37.5, using alprazolam sparingly, last refill was in December 2016. Refill provided today. UDS on file and is due again in June for which she will get from pain management and fax over.

## 2016-01-18 NOTE — Progress Notes (Signed)
Subjective:    Patient ID: Danielle Edwards, female    DOB: Aug 01, 1956, 60 y.o.   MRN: [id number]  HPI  Danielle Edwards is a 60 year old female who presents today for follow up.  1) Type 2 Diabetes: Currently managed on Glimepiride 4 mg, Januvia 100 mg, and Metformin 100 mg BID. Last A1C of 8.0 in September 2016. She notified us during her last visit in December 2016 that she would be participating an a diabetes program through [facility]. She never participated as the study did not get enough participants.   She does not have a recent glucometer and hasn't checked her sugars in numerous years. She endorses a poor diet since the Holidays in December 2016.   Wt Readings from Last 3 Encounters:  01/18/16 268 lb 12.8 oz (121.927 kg)  10/30/15 274 lb (124.286 kg)  09/21/15 270 lb 12.8 oz (122.834 kg)     2) Anxiety and Depression: Long history of taking only Alprazolam as needed 2 times daily for anxiety. Has been initiated on Lexapro in the past in our clinic and stopped taking as she didn't like the way she felt. She was provided with low dose Effexor in December 2016 and was told to follow up for re-evaluation for which she did not.   Since her last visit she's continued to take the Effexor and has noticed improvement in her overall anxiety. She's feeling calmer and steady throughout the day and is needed her Alprazolam less frequently. She is requesting a refill of her Alprazolam today as she's not had this filled since December 2016.   3) Hyperlipidemia: Last lipid panel was above goal in July 2016. She was to start taking Fish Oil and work on her diet. She was to follow up 3 months later for repeat lipids. Lipids due today. Discussed if no improvement that she will likely need to start a statin, especially given her diabetes.   4) Essential Hypertension: Currently managed on losartan 100 mg, HCTZ 12.5 mg, and amlodipine 10 mg. BP stable in office today. Denies chest pain, SOB, dizziness, headaches.    Review of Systems  Respiratory: Negative for shortness of breath.   Cardiovascular: Negative for chest pain.  Musculoskeletal: Negative for myalgias.  Neurological: Negative for dizziness.       Occasional numbness to her hands  Psychiatric/Behavioral:       See HPI       Past Medical History  Diagnosis Date  . Headache   . Vitamin D deficiency   . Hypertension   . Anemia     Iron defiency   . Diabetes type 2, controlled ([place])   . Hypothyroidism   . Chronic pain   . IBS (irritable bowel syndrome)     Diarrhea type  . History of colonic polyps 1999    Benign  . Hx of adenomatous polyp of colon 09/27/2000    2001 - diminutive adenoma [place] 2007 no polyps - [place]     Social History   Social History  . Marital Status: Married    Spouse Name: N/A  . Number of Children: N/A  . Years of Education: N/A   Occupational History  . Not on file.   Social History Main Topics  . Smoking status: Never Smoker   . Smokeless tobacco: Never Used  . Alcohol Use: 0.0 oz/week    0 Standard drinks or equivalent per week     Comment: rarely  . Drug Use: No  . Sexual Activity: Not  on file   Other Topics Concern  . Not on file   Social History Narrative   Married.   3 children.   Retired, worked at [employer] in [place], [place].   Enjoys being with her grandchildren, being with her friends.       Past Surgical History  Procedure Laterality Date  . Vaginal hysterectomy      Complete  . Cholecystectomy  1995  . Tonsillectomy and adenoidectomy    . Nasal sinus surgery  2001  . Colonoscopy  multiple    Family History  Problem Relation Age of Onset  . Colon cancer Mother   . Lung cancer Mother   . Skin cancer Mother   . Heart disease Father   . Diabetes Father   . Skin cancer Father     No Known Allergies  Current Outpatient Prescriptions on File Prior to Visit  Medication Sig Dispense Refill  . amLODipine (NORVASC) 10 MG tablet TAKE 1 TABLET BY MOUTH DAILY 30  tablet 9  . carisoprodol (SOMA) 350 MG tablet Take 350 mg by mouth 2 (two) times daily.  1  . glimepiride (AMARYL) 4 MG tablet TAKE 1 TABLET (4 MG TOTAL) BY MOUTH 2 (TWO) TIMES DAILY. 60 tablet 0  . hydrochlorothiazide (MICROZIDE) 12.5 MG capsule TAKE 1 CAPSULE (12.5 MG TOTAL) BY MOUTH DAILY. 90 capsule 0  . HYDROcodone-acetaminophen (NORCO) 10-325 MG tablet Reported on 09/21/2015  0  . levothyroxine (LEVOTHROID) 25 MCG tablet Take 1 tablet (25 mcg total) by mouth daily before breakfast. 30 tablet 3  . loperamide (IMODIUM A-D) 2 MG tablet Take 2 mg by mouth 4 (four) times daily as needed for diarrhea or loose stools.    Marland Kitchen losartan (COZAAR) 100 MG tablet TAKE 1 TABLET BY MOUTH DAILY 90 tablet 3  . metFORMIN (GLUCOPHAGE) 1000 MG tablet TAKE 1 TABLET (1,000 MG TOTAL) BY MOUTH 2 (TWO) TIMES DAILY WITH A MEAL. 180 tablet 0  . promethazine (PHENERGAN) 25 MG tablet Take 25 mg by mouth 4 (four) times daily. Reported on 09/21/2015  1  . fluticasone (FLONASE) 50 MCG/ACT nasal spray Place 2 sprays into both nostrils daily. (Patient not taking: Reported on 01/18/2016) 16 g 0   No current facility-administered medications on file prior to visit.    BP 136/78 mmHg  Pulse 82  Temp(Src) 97.7 F (36.5 C) (Oral)  Ht 5\' 7"  (1.702 m)  Wt 268 lb 12.8 oz (121.927 kg)  BMI 42.09 kg/m2  SpO2 97%    Objective:   Physical Exam  Constitutional: She appears well-nourished.  Cardiovascular: Normal rate and regular rhythm.   Pulmonary/Chest: Effort normal and breath sounds normal.  Skin: Skin is warm and dry.          Assessment & Plan:

## 2016-01-18 NOTE — Patient Instructions (Addendum)
Complete lab work prior to leaving today. I will notify you of your results once received.   You will be contacted regarding your referral to the diabetic  nutritionist.  Please let us know if you have not heard back within one week.   Continue taking Effexor tablets for anxiety. Use the Alprazolam (Xanax) sparingly as needed for breakthrough anxiety.   Please schedule a physical with me in August 2017. You may also schedule a lab only appointment 3-4 days prior. We will discuss your lab results in detail during your physical.  It was a pleasure to see you today!  Diabetes Mellitus and Food It is important for you to manage your blood sugar (glucose) level. Your blood glucose level can be greatly affected by what you eat. Eating healthier foods in the appropriate amounts throughout the day at about the same time each day will help you control your blood glucose level. It can also help slow or prevent worsening of your diabetes mellitus. Healthy eating may even help you improve the level of your blood pressure and reach or maintain a healthy weight.  General recommendations for healthful eating and cooking habits include:  Eating meals and snacks regularly. Avoid going long periods of time without eating to lose weight.  Eating a diet that consists mainly of plant-based foods, such as fruits, vegetables, nuts, legumes, and whole grains.  Using low-heat cooking methods, such as baking, instead of high-heat cooking methods, such as deep frying. Work with your dietitian to make sure you understand how to use the Nutrition Facts information on food labels. HOW CAN FOOD AFFECT ME? Carbohydrates Carbohydrates affect your blood glucose level more than any other type of food. Your dietitian will help you determine how many carbohydrates to eat at each meal and teach you how to count carbohydrates. Counting carbohydrates is important to keep your blood glucose at a healthy level, especially if you are  using insulin or taking certain medicines for diabetes mellitus. Alcohol Alcohol can cause sudden decreases in blood glucose (hypoglycemia), especially if you use insulin or take certain medicines for diabetes mellitus. Hypoglycemia can be a life-threatening condition. Symptoms of hypoglycemia (sleepiness, dizziness, and disorientation) are similar to symptoms of having too much alcohol.  If your health care provider has given you approval to drink alcohol, do so in moderation and use the following guidelines:  Women should not have more than one drink per day, and men should not have more than two drinks per day. One drink is equal to:  12 oz of beer.  5 oz of wine.  1 oz of hard liquor.  Do not drink on an empty stomach.  Keep yourself hydrated. Have water, diet soda, or unsweetened iced tea.  Regular soda, juice, and other mixers might contain a lot of carbohydrates and should be counted. WHAT FOODS ARE NOT RECOMMENDED? As you make food choices, it is important to remember that all foods are not the same. Some foods have fewer nutrients per serving than other foods, even though they might have the same number of calories or carbohydrates. It is difficult to get your body what it needs when you eat foods with fewer nutrients. Examples of foods that you should avoid that are high in calories and carbohydrates but low in nutrients include:  Trans fats (most processed foods list trans fats on the Nutrition Facts label).  Regular soda.  Juice.  Candy.  Sweets, such as cake, pie, doughnuts, and cookies.  Fried foods. WHAT FOODS  CAN I EAT? Eat nutrient-rich foods, which will nourish your body and keep you healthy. The food you should eat also will depend on several factors, including:  The calories you need.  The medicines you take.  Your weight.  Your blood glucose level.  Your blood pressure level.  Your cholesterol level. You should eat a variety of foods,  including:  Protein.  Lean cuts of meat.  Proteins low in saturated fats, such as fish, egg whites, and beans. Avoid processed meats.  Fruits and vegetables.  Fruits and vegetables that may help control blood glucose levels, such as apples, mangoes, and yams.  Dairy products.  Choose fat-free or low-fat dairy products, such as milk, yogurt, and cheese.  Grains, bread, pasta, and rice.  Choose whole grain products, such as multigrain bread, whole oats, and brown rice. These foods may help control blood pressure.  Fats.  Foods containing healthful fats, such as nuts, avocado, olive oil, canola oil, and fish. DOES EVERYONE WITH DIABETES MELLITUS HAVE THE SAME MEAL PLAN? Because every person with diabetes mellitus is different, there is not one meal plan that works for everyone. It is very important that you meet with a dietitian who will help you create a meal plan that is just right for you.   This information is not intended to replace advice given to you by your health care provider. Make sure you discuss any questions you have with your health care provider.   Document Released: 06/16/2005 Document Revised: 10/10/2014 Document Reviewed: 08/16/2013 Elsevier Interactive Patient Education [employer].

## 2016-01-19 ENCOUNTER — Telehealth: Payer: Self-pay | Admitting: Primary Care

## 2016-01-19 NOTE — Telephone Encounter (Signed)
Pt returned your call Best number [phone]

## 2016-01-20 ENCOUNTER — Telehealth: Payer: Self-pay

## 2016-01-20 ENCOUNTER — Other Ambulatory Visit: Payer: Self-pay | Admitting: Primary Care

## 2016-01-20 DIAGNOSIS — E785 Hyperlipidemia, unspecified: Secondary | ICD-10-CM

## 2016-01-20 MED ORDER — GLUCOSE BLOOD VI STRP: ORAL_STRIP | Status: DC

## 2016-01-20 MED ORDER — ATORVASTATIN CALCIUM 20 MG PO TABS
20.0000 mg | ORAL_TABLET | Freq: Every evening | ORAL | Status: DC
Start: 2016-01-20 — End: 2016-11-02

## 2016-01-20 MED ORDER — ONETOUCH ULTRA SYSTEM W/DEVICE KIT: PACK | Status: DC

## 2016-01-20 MED ORDER — LANCET DEVICE MISC: Status: DC

## 2016-01-20 MED ORDER — BLOOD GLUCOSE MONITOR KIT: PACK | Status: DC

## 2016-01-20 NOTE — Addendum Note (Signed)
Addended by: Jacqualin Combes on: 01/20/2016 01:13 PM   Modules accepted: Orders, Medications

## 2016-01-20 NOTE — Telephone Encounter (Signed)
Called and notified patient of [name]'s comments. Patient verbalized understanding. More info on results note 01/18/2016.

## 2016-01-20 NOTE — Telephone Encounter (Signed)
Done and corrected then sent to [facility].

## 2016-01-20 NOTE — Telephone Encounter (Signed)
Received a fax from the pharmacy stating they needed a new script for the test strips stating the testing frequency

## 2016-01-20 NOTE — Telephone Encounter (Signed)
Danielle Edwards, Will you please print this and fax? She may test 2-3 times daily.

## 2016-02-05 ENCOUNTER — Other Ambulatory Visit: Payer: Self-pay | Admitting: Primary Care

## 2016-02-05 DIAGNOSIS — E119 Type 2 diabetes mellitus without complications: Secondary | ICD-10-CM

## 2016-02-05 NOTE — Telephone Encounter (Signed)
Electronically refill request for   glimepiride (AMARYL) 4 MG tablet   TAKE 1 TABLET (4 MG TOTAL) BY MOUTH 2 (TWO) TIMES DAILY.  Dispense: 60 tablet   Refills: 0   Last prescscribed 01/06/2016.  promethazine (PHENERGAN) 25 MG tablet   Take 25 mg by mouth 4 (four) times daily. Reported on 09/21/2015  Dispense: Not specified   Refills: 1     Last prescribed on 07/03/2015.  Last seen on 01/18/2016. CPE on 05/30/2016.

## 2016-02-12 ENCOUNTER — Telehealth: Payer: Self-pay | Admitting: Primary Care

## 2016-02-12 NOTE — Telephone Encounter (Signed)
Please notify Ms. Crotts that the nutritionist has been attempting to reach her in order to schedule an appointment. I believe this will be of great value to her in regards to her diabetes.

## 2016-02-15 NOTE — Telephone Encounter (Signed)
Message left for patient to return my call.  

## 2016-02-16 NOTE — Telephone Encounter (Signed)
Per DPR, left detail for patient of [name]'s comments and call back to update.

## 2016-05-18 ENCOUNTER — Other Ambulatory Visit: Payer: Self-pay | Admitting: Primary Care

## 2016-05-18 ENCOUNTER — Telehealth: Payer: Self-pay | Admitting: Primary Care

## 2016-05-18 NOTE — Telephone Encounter (Signed)
Received faxed refill request for promethazine (PHENERGAN) 25 MG tablet.  The medication have not been prescribed by Danielle Edwards.  Last  seen on 01/18/2016. Next lab appt on 05/23/2016.

## 2016-05-18 NOTE — Telephone Encounter (Signed)
Please call and ask patient why she is taking Phenergan. I received a refill request for this. I have never prescribed this before and we have never discussed the use of this medication.

## 2016-05-19 ENCOUNTER — Other Ambulatory Visit: Payer: Self-pay | Admitting: Primary Care

## 2016-05-19 DIAGNOSIS — E119 Type 2 diabetes mellitus without complications: Secondary | ICD-10-CM

## 2016-05-19 DIAGNOSIS — Z1159 Encounter for screening for other viral diseases: Secondary | ICD-10-CM

## 2016-05-19 DIAGNOSIS — I10 Essential (primary) hypertension: Secondary | ICD-10-CM

## 2016-05-19 DIAGNOSIS — E039 Hypothyroidism, unspecified: Secondary | ICD-10-CM

## 2016-05-19 DIAGNOSIS — E785 Hyperlipidemia, unspecified: Secondary | ICD-10-CM

## 2016-05-19 NOTE — Telephone Encounter (Signed)
Per DPR, left detail message left for patient to return my call. 

## 2016-05-20 NOTE — Telephone Encounter (Signed)
Patient said it was a mistake by the pharmacy.  It was prescribed by her pain medicine doctor.  Patients not taking Zofran it helped the diarrhea,but it didn't help her nausea. The phenergan helps her nausea much better.

## 2016-05-22 NOTE — Telephone Encounter (Signed)
Please have patient request a refill from her pain management MD.

## 2016-05-23 ENCOUNTER — Other Ambulatory Visit: Payer: Medicare PPO

## 2016-05-23 ENCOUNTER — Ambulatory Visit: Payer: Medicare PPO

## 2016-05-23 NOTE — Telephone Encounter (Signed)
Per DPR, left detail message for patient of [name]'s comment.

## 2016-05-30 ENCOUNTER — Encounter: Payer: Medicare PPO | Admitting: Primary Care

## 2016-06-13 ENCOUNTER — Other Ambulatory Visit: Payer: Self-pay | Admitting: Primary Care

## 2016-06-13 DIAGNOSIS — E119 Type 2 diabetes mellitus without complications: Secondary | ICD-10-CM

## 2016-06-21 ENCOUNTER — Other Ambulatory Visit: Payer: Self-pay | Admitting: Primary Care

## 2016-06-21 DIAGNOSIS — K219 Gastro-esophageal reflux disease without esophagitis: Secondary | ICD-10-CM

## 2016-06-21 MED ORDER — PANTOPRAZOLE SODIUM 40 MG PO TBEC: 40.0000 mg | DELAYED_RELEASE_TABLET | Freq: Every day | ORAL | 1 refills | Status: DC

## 2016-06-21 NOTE — Telephone Encounter (Signed)
Received faxed refill request for 90 days supply of   pantoprazole (PROTONIX) 40 MG tablet  Last prescribed and seen on 01/18/2016.   Will refill as requested.

## 2016-07-04 ENCOUNTER — Other Ambulatory Visit: Payer: Self-pay | Admitting: Primary Care

## 2016-07-14 ENCOUNTER — Telehealth: Payer: Self-pay

## 2016-07-14 NOTE — Telephone Encounter (Signed)
Pt left v/m; pt spoke with ins and until 2018 pt will have to pay $159.00 for Januvia; pt had been paying $47.00. Pt went on line and does not qualify for decreased copay. Pt wants to know if has samples of Januvia or can be changed to a generic med. Pt request cb. Pt last seen 01/18/16. [facility] [place]

## 2016-07-14 NOTE — Telephone Encounter (Signed)
Spoken and notified patient of [name]'s comments. Patient stated that she will call her insurance regarding Jardiance and Invokana.  Patient will call back to schedule follow. She would daughter in law to come with her on appointment so will check with her first then schedule appointment.

## 2016-07-14 NOTE — Telephone Encounter (Signed)
She is due now for a follow up office visit and labs, please schedule. Also please have patient call her insurance and inquire what is covered under her formulary. Jardiance or Invokana would be good options.  She should already be getting the generic of Januvia called sitagliptin.

## 2016-07-15 NOTE — Telephone Encounter (Signed)
Pt left v/m that she spoke with someone at ins co.and a form will be faxed to Allie Bossier NP to be filled out so pt can get Januvia at previous price of $47.00. Pt request cb when completed.

## 2016-07-19 NOTE — Telephone Encounter (Signed)
Notified patient that the form was completed and faxed to [facility]. Patient verbalized understanding. Notified patient will take 48 to 72 hours for a respond.

## 2016-07-28 ENCOUNTER — Encounter: Payer: Self-pay | Admitting: Primary Care

## 2016-07-28 NOTE — Telephone Encounter (Signed)
Faxed as requested to [facility] at [phone]  Notified patient that it has been completed

## 2016-07-28 NOTE — Telephone Encounter (Signed)
Noted. Letter printed and placed in Chan's inbox

## 2016-07-28 NOTE — Telephone Encounter (Signed)
Pt spoke with [employer];the Januvia request is being appealed and pt needs letter faxed to [phone] that pt is taking maximum doses of Amaryl and Metformin and pt needs to take the Januvia as well. Appeal # [id number]. Pt request cb when completed.

## 2016-08-05 ENCOUNTER — Encounter: Payer: Self-pay | Admitting: Primary Care

## 2016-08-05 ENCOUNTER — Ambulatory Visit (INDEPENDENT_AMBULATORY_CARE_PROVIDER_SITE_OTHER): Payer: Medicare PPO | Admitting: Primary Care

## 2016-08-05 VITALS — BP 148/96 | HR 87 | Temp 98.7°F | Ht 67.0 in | Wt 279.1 lb

## 2016-08-05 DIAGNOSIS — I10 Essential (primary) hypertension: Secondary | ICD-10-CM

## 2016-08-05 DIAGNOSIS — E559 Vitamin D deficiency, unspecified: Secondary | ICD-10-CM | POA: Diagnosis not present

## 2016-08-05 DIAGNOSIS — F418 Other specified anxiety disorders: Secondary | ICD-10-CM

## 2016-08-05 DIAGNOSIS — F411 Generalized anxiety disorder: Secondary | ICD-10-CM

## 2016-08-05 DIAGNOSIS — E785 Hyperlipidemia, unspecified: Secondary | ICD-10-CM

## 2016-08-05 DIAGNOSIS — Z1159 Encounter for screening for other viral diseases: Secondary | ICD-10-CM

## 2016-08-05 DIAGNOSIS — F419 Anxiety disorder, unspecified: Secondary | ICD-10-CM

## 2016-08-05 DIAGNOSIS — E039 Hypothyroidism, unspecified: Secondary | ICD-10-CM | POA: Diagnosis not present

## 2016-08-05 DIAGNOSIS — R109 Unspecified abdominal pain: Secondary | ICD-10-CM

## 2016-08-05 DIAGNOSIS — E118 Type 2 diabetes mellitus with unspecified complications: Secondary | ICD-10-CM

## 2016-08-05 DIAGNOSIS — F329 Major depressive disorder, single episode, unspecified: Secondary | ICD-10-CM

## 2016-08-05 LAB — LIPID PANEL
CHOL/HDL RATIO: 4
CHOLESTEROL: 205 mg/dL — AB (ref 0–200)
HDL: 46.6 mg/dL (ref >39.00)
NONHDL: 157.96
TRIGLYCERIDES: 247 mg/dL — AB (ref 0.0–149.0)
VLDL: 49.4 mg/dL — AB (ref 0.0–40.0)

## 2016-08-05 LAB — COMPREHENSIVE METABOLIC PANEL
ALBUMIN: 4 g/dL (ref 3.5–5.2)
ALT: 20 U/L (ref 0–35)
AST: 17 U/L (ref 0–37)
Alkaline Phosphatase: 86 U/L (ref 39–117)
BUN: 25 mg/dL — ABNORMAL HIGH (ref 6–23)
CALCIUM: 9.1 mg/dL (ref 8.4–10.5)
CHLORIDE: 104 meq/L (ref 96–112)
CO2: 24 meq/L (ref 19–32)
CREATININE: 1.08 mg/dL (ref 0.40–1.20)
GFR: 54.76 mL/min — AB (ref >60.00)
Glucose, Bld: 147 mg/dL — ABNORMAL HIGH (ref 70–99)
POTASSIUM: 4.3 meq/L (ref 3.5–5.1)
Sodium: 138 mEq/L (ref 135–145)
Total Bilirubin: 0.2 mg/dL (ref 0.2–1.2)
Total Protein: 7.6 g/dL (ref 6.0–8.3)

## 2016-08-05 LAB — POC URINALSYSI DIPSTICK (AUTOMATED)
BILIRUBIN UA: NEGATIVE
Blood, UA: NEGATIVE
Glucose, UA: NEGATIVE
KETONES UA: NEGATIVE
LEUKOCYTES UA: NEGATIVE
NITRITE UA: NEGATIVE
Spec Grav, UA: >=1.03
Urobilinogen, UA: NEGATIVE
pH, UA: 5

## 2016-08-05 LAB — VITAMIN D 25 HYDROXY (VIT D DEFICIENCY, FRACTURES): VITD: 37.66 ng/mL (ref 30.00–100.00)

## 2016-08-05 LAB — MICROALBUMIN / CREATININE URINE RATIO
CREATININE, U: 254.4 mg/dL
MICROALB/CREAT RATIO: 1.7 mg/g (ref 0.0–30.0)
Microalb, Ur: 4.4 mg/dL — ABNORMAL HIGH (ref 0.0–1.9)

## 2016-08-05 LAB — LDL CHOLESTEROL, DIRECT: LDL DIRECT: 128 mg/dL

## 2016-08-05 LAB — TSH: TSH: 3.27 u[IU]/mL (ref 0.35–4.50)

## 2016-08-05 LAB — HEMOGLOBIN A1C: Hgb A1c MFr Bld: 8.2 % — ABNORMAL HIGH (ref 4.6–6.5)

## 2016-08-05 MED ORDER — ALPRAZOLAM 0.5 MG PO TABS: 0.5000 mg | ORAL_TABLET | Freq: Every day | ORAL | 0 refills | Status: DC | PRN

## 2016-08-05 NOTE — Patient Instructions (Addendum)
Complete lab work prior to leaving today.   You will be contacted regarding your referral to therapy.  Please let us know if you have not heard back within one week.   It is important that you improve your diet. Please limit carbohydrates in the form of white bread, rice, pasta, sweets, fast food, fried food, sugary drinks, etc. Increase your consumption of fresh fruits and vegetables, whole grains, lean protein.  Ensure you are consuming 64 ounces of water daily.  Start exercising. You should be getting 150 minutes of moderate intensity exercise weekly.  We will be in touch later regarding your lab results.  Follow up in 6 months for re-evaluation.  It was a pleasure to see you today!  Diabetes Mellitus and Food It is important for you to manage your blood sugar (glucose) level. Your blood glucose level can be greatly affected by what you eat. Eating healthier foods in the appropriate amounts throughout the day at about the same time each day will help you control your blood glucose level. It can also help slow or prevent worsening of your diabetes mellitus. Healthy eating may even help you improve the level of your blood pressure and reach or maintain a healthy weight.  General recommendations for healthful eating and cooking habits include:  Eating meals and snacks regularly. Avoid going long periods of time without eating to lose weight.  Eating a diet that consists mainly of plant-based foods, such as fruits, vegetables, nuts, legumes, and whole grains.  Using low-heat cooking methods, such as baking, instead of high-heat cooking methods, such as deep frying. Work with your dietitian to make sure you understand how to use the Nutrition Facts information on food labels. HOW CAN FOOD AFFECT ME? Carbohydrates Carbohydrates affect your blood glucose level more than any other type of food. Your dietitian will help you determine how many carbohydrates to eat at each meal and teach you how  to count carbohydrates. Counting carbohydrates is important to keep your blood glucose at a healthy level, especially if you are using insulin or taking certain medicines for diabetes mellitus. Alcohol Alcohol can cause sudden decreases in blood glucose (hypoglycemia), especially if you use insulin or take certain medicines for diabetes mellitus. Hypoglycemia can be a life-threatening condition. Symptoms of hypoglycemia (sleepiness, dizziness, and disorientation) are similar to symptoms of having too much alcohol.  If your health care provider has given you approval to drink alcohol, do so in moderation and use the following guidelines:  Women should not have more than one drink per day, and men should not have more than two drinks per day. One drink is equal to:  12 oz of beer.  5 oz of wine.  1 oz of hard liquor.  Do not drink on an empty stomach.  Keep yourself hydrated. Have water, diet soda, or unsweetened iced tea.  Regular soda, juice, and other mixers might contain a lot of carbohydrates and should be counted. WHAT FOODS ARE NOT RECOMMENDED? As you make food choices, it is important to remember that all foods are not the same. Some foods have fewer nutrients per serving than other foods, even though they might have the same number of calories or carbohydrates. It is difficult to get your body what it needs when you eat foods with fewer nutrients. Examples of foods that you should avoid that are high in calories and carbohydrates but low in nutrients include:  Trans fats (most processed foods list trans fats on the Nutrition Facts label).  Regular soda.  Juice.  Candy.  Sweets, such as cake, pie, doughnuts, and cookies.  Fried foods. WHAT FOODS CAN I EAT? Eat nutrient-rich foods, which will nourish your body and keep you healthy. The food you should eat also will depend on several factors, including:  The calories you need.  The medicines you take.  Your weight.  Your  blood glucose level.  Your blood pressure level.  Your cholesterol level. You should eat a variety of foods, including:  Protein.  Lean cuts of meat.  Proteins low in saturated fats, such as fish, egg whites, and beans. Avoid processed meats.  Fruits and vegetables.  Fruits and vegetables that may help control blood glucose levels, such as apples, mangoes, and yams.  Dairy products.  Choose fat-free or low-fat dairy products, such as milk, yogurt, and cheese.  Grains, bread, pasta, and rice.  Choose whole grain products, such as multigrain bread, whole oats, and brown rice. These foods may help control blood pressure.  Fats.  Foods containing healthful fats, such as nuts, avocado, olive oil, canola oil, and fish. DOES EVERYONE WITH DIABETES MELLITUS HAVE THE SAME MEAL PLAN? Because every person with diabetes mellitus is different, there is not one meal plan that works for everyone. It is very important that you meet with a dietitian who will help you create a meal plan that is just right for you.   This information is not intended to replace advice given to you by your health care provider. Make sure you discuss any questions you have with your health care provider.   Document Released: 06/16/2005 Document Revised: 10/10/2014 Document Reviewed: 08/16/2013 Elsevier Interactive Patient Education [employer].

## 2016-08-05 NOTE — Assessment & Plan Note (Signed)
Level in the 20's initially, stable now on OTC vitamin D. Continue same.

## 2016-08-05 NOTE — Assessment & Plan Note (Signed)
Overall stable on Effexor. Uses Xanax sparingly. She finally agrees to therapy, referral placed. Refill for Xanax provided today which should last her several months. She will send UDS/Controlled substance contract that she completed per pain management.

## 2016-08-05 NOTE — Progress Notes (Signed)
Pre visit review using our clinic review tool, if applicable. No additional management support is needed unless otherwise documented below in the visit note. 

## 2016-08-05 NOTE — Assessment & Plan Note (Signed)
Slightly above goal in the office today. Prior office readings stable. Continue losartan, amlodipine, and HCTZ for now.  Will continue to monitor. BMP unremarkable.

## 2016-08-05 NOTE — Assessment & Plan Note (Addendum)
A1C of 8.2 on labs today, has been without Januvia. Urine microalbumin positive, managed on ARB. Foot exam unremarkable today. Managed on statin. Recommended low dose Lantus for which she refused. Will try Jardiance, BMP stable. Will continue to monitor renal function. Repeat A1C in 3 months.

## 2016-08-05 NOTE — Progress Notes (Signed)
Subjective:    Patient ID: Danielle Edwards, female    DOB: 1956/06/08, 60 y.o.   MRN: [id number]  HPI  Danielle Edwards is a 60 year old female who presents today for follow up.  1) Type 2 Diabetes: Currently managed on glimepiride 4 mg BID, metformin 1000 mg BID, and Januvia 100 mg once daily. Her last A1C in April 2017 was 7.4. She was planning to work on improvements in diet and exercise through a nutritionist. She is due for repeat A1C and urine microalbumin today.  Since her last visit she's not had her Januvia in 5 weeks as she cannot afford as she is in the donut hole. She should be able to afford the Januvia in January. She's joined an online group to help motivate her with her diet, but she doesn't feel like she's been doing as well as she could. She is not currently exercising. She does notice numbness/tingling to her hands and feet.   2) Essential Hypertension: Currently managed on Amlodipine 10 mg, HCTZ 12.5 mg, and losartan 100 mg. She denies chest pain, dizziness, visual changes.  BP Readings from Last 3 Encounters:  08/05/16 (!) 148/96  01/18/16 136/78  10/30/15 138/82     3) Hypothyroidism: Currently managed on levothyroxine 25 mcg. Her last TSH was over 1 year ago and was stable. She is due for recheck today.   4) Hyperlipidemia: Currently managed on atorvastatin 20 mg for TC of 213, LDL of 130, and Trigs of 193 in April 2017. She is due for recheck of her cholesterol today. Denies myalgias.  5) Anxiety and Depression: Currently managed on Effexor 37.5 mg and alprazolam 0.5 mg. She's taking her Xanax sparingly, only when going out in public as she gets nervous in large crowds. She is interested in seeing a therapist as this was recommended last visit. She denies SI/HI.  6) Flank Pain: Located to bilateral flank. Has noticed a "frothy" appearance to her urine over the past several months. She denies hemturia, fevers, urgency, frequency.   Review of Systems  Eyes: Negative for  visual disturbance.  Respiratory: Negative for shortness of breath.   Cardiovascular: Negative for chest pain.  Endocrine: Negative for polyuria.  Musculoskeletal: Negative for myalgias.  Neurological: Positive for numbness. Negative for dizziness and headaches.  Psychiatric/Behavioral: Negative for suicidal ideas.       Past Medical History:  Diagnosis Date  . Anemia    Iron defiency   . Chronic pain   . Diabetes type 2, controlled ([place])   . Headache   . History of colonic polyps 1999   Benign  . Hx of adenomatous polyp of colon 09/27/2000   2001 - diminutive adenoma [place] 2007 no polyps - [place]  . Hypertension   . Hypothyroidism   . IBS (irritable bowel syndrome)    Diarrhea type  . Vitamin D deficiency      Social History   Social History  . Marital status: Married    Spouse name: N/A  . Number of children: N/A  . Years of education: N/A   Occupational History  . Not on file.   Social History Main Topics  . Smoking status: Never Smoker  . Smokeless tobacco: Never Used  . Alcohol use 0.0 oz/week     Comment: rarely  . Drug use: No  . Sexual activity: Not on file   Other Topics Concern  . Not on file   Social History Narrative   Married.   3 children.  Retired, worked at [employer] in [place], [place].   Enjoys being with her grandchildren, being with her friends.       Past Surgical History:  Procedure Laterality Date  . CHOLECYSTECTOMY  1995  . COLONOSCOPY  multiple  . NASAL SINUS SURGERY  2001  . TONSILLECTOMY AND ADENOIDECTOMY    . VAGINAL HYSTERECTOMY     Complete    Family History  Problem Relation Age of Onset  . Colon cancer Mother   . Lung cancer Mother   . Skin cancer Mother   . Heart disease Father   . Diabetes Father   . Skin cancer Father     No Known Allergies  Current Outpatient Prescriptions on File Prior to Visit  Medication Sig Dispense Refill  . amLODipine (NORVASC) 10 MG tablet TAKE 1 TABLET BY MOUTH DAILY 30  tablet 5  . atorvastatin (LIPITOR) 20 MG tablet Take 1 tablet (20 mg total) by mouth every evening. 90 tablet 1  . Blood Glucose Monitoring Suppl (ONE TOUCH ULTRA SYSTEM KIT) w/Device KIT Use 2 to 3 times daily to check blood sugar. 1 each 0  . carisoprodol (SOMA) 350 MG tablet Take 350 mg by mouth 2 (two) times daily.  1  . fluticasone (FLONASE) 50 MCG/ACT nasal spray Place 2 sprays into both nostrils daily. 16 g 0  . glimepiride (AMARYL) 4 MG tablet TAKE 1 TABLET (4 MG TOTAL) BY MOUTH 2 (TWO) TIMES DAILY. 180 tablet 2  . glucose blood test strip Use to check blood sugar 2-3 times daily. 100 each 11  . hydrochlorothiazide (MICROZIDE) 12.5 MG capsule TAKE 1 CAPSULE (12.5 MG TOTAL) BY MOUTH DAILY. 90 capsule 0  . Lancet Device MISC Use as direct to check blood sugar 2-3 times daily 100 each 11  . levothyroxine (LEVOTHROID) 25 MCG tablet Take 1 tablet (25 mcg total) by mouth daily before breakfast. 30 tablet 3  . loperamide (IMODIUM A-D) 2 MG tablet Take 2 mg by mouth 4 (four) times daily as needed for diarrhea or loose stools.    Marland Kitchen losartan (COZAAR) 100 MG tablet TAKE 1 TABLET BY MOUTH DAILY 90 tablet 3  . metFORMIN (GLUCOPHAGE) 1000 MG tablet TAKE 1 TABLET (1,000 MG TOTAL) BY MOUTH 2 (TWO) TIMES DAILY WITH A MEAL. 180 tablet 0  . ondansetron (ZOFRAN) 4 MG tablet Take 1 tablet (4 mg total) by mouth every 8 (eight) hours as needed for nausea or vomiting (diarrhea). 30 tablet 1  . pantoprazole (PROTONIX) 40 MG tablet Take 1 tablet (40 mg total) by mouth daily. 90 tablet 1  . venlafaxine XR (EFFEXOR XR) 37.5 MG 24 hr capsule Take 1 capsule (37.5 mg total) by mouth daily with breakfast. 90 capsule 2  . HYDROcodone-acetaminophen (NORCO) 10-325 MG tablet Reported on 09/21/2015  0  . promethazine (PHENERGAN) 25 MG tablet Take 25 mg by mouth every 6 (six) hours as needed. Reported on 09/21/2015  1  . sitaGLIPtin (JANUVIA) 100 MG tablet Take 1 tablet (100 mg total) by mouth daily. (Patient not taking:  Reported on 08/05/2016) 90 tablet 2   No current facility-administered medications on file prior to visit.     BP (!) 148/96   Pulse 87   Temp 98.7 F (37.1 C) (Oral)   Ht '5\' 7"'$  (1.702 m)   Wt 279 lb 1.9 oz (126.6 kg)   SpO2 98%   BMI 43.72 kg/m    Objective:   Physical Exam  Constitutional: She appears well-nourished.  Neck: Neck supple.  Cardiovascular: Normal rate and regular rhythm.   Pulmonary/Chest: Effort normal and breath sounds normal.  Skin: Skin is warm and dry.  Psychiatric: She has a normal mood and affect.          Assessment & Plan:

## 2016-08-05 NOTE — Assessment & Plan Note (Signed)
TSH stable on recent labs. Continue levothyroxine 25 mcg.

## 2016-08-05 NOTE — Assessment & Plan Note (Signed)
Recent lipid panel above goal. Managed on atorvastatin, question compliance.  Will increase atorvastatin if she is compliant. LFT's unremarkable.

## 2016-08-06 LAB — HEPATITIS C ANTIBODY: HCV Ab: NEGATIVE

## 2016-08-11 ENCOUNTER — Telehealth: Payer: Self-pay | Admitting: Primary Care

## 2016-08-11 NOTE — Telephone Encounter (Signed)
Pt was off of januvia for 5 weeks , pt started taking it again yesterday

## 2016-08-11 NOTE — Telephone Encounter (Signed)
See result note.  

## 2016-08-11 NOTE — Telephone Encounter (Signed)
Pt returned your call - please call back thanks  [phone]

## 2016-09-16 ENCOUNTER — Other Ambulatory Visit: Payer: Self-pay

## 2016-09-16 DIAGNOSIS — E119 Type 2 diabetes mellitus without complications: Secondary | ICD-10-CM

## 2016-09-16 MED ORDER — METFORMIN HCL 1000 MG PO TABS: ORAL_TABLET | ORAL | 0 refills | Status: DC

## 2016-09-16 NOTE — Telephone Encounter (Signed)
Pt left v/m requesting refill metformin to [facility];last refilled # 180 on 06/13/16; last seen 08/05/16 and under diabetes assessment notes at end of office note; mentions meds for diabetes but do not see metformin mentioned.Please advise.

## 2016-10-05 ENCOUNTER — Telehealth: Payer: Self-pay

## 2016-10-05 DIAGNOSIS — E119 Type 2 diabetes mellitus without complications: Secondary | ICD-10-CM

## 2016-10-05 MED ORDER — SITAGLIPTIN PHOSPHATE 100 MG PO TABS: 100.0000 mg | ORAL_TABLET | Freq: Every day | ORAL | 2 refills | Status: DC

## 2016-10-05 NOTE — Telephone Encounter (Signed)
Pt request refill Januvia to [facility];I did not see on current med list but pt has been taking; spoke with pharmacist at [facility] and was advised Januvia was sent electronically on 01/18/16 # 90 x 2 refills. Not on current or hx med  List but pt was seen 01/18/16.Please advise.

## 2016-10-05 NOTE — Telephone Encounter (Signed)
Okay to refill current Januvia prescription. Refills sent to pharmacy.

## 2016-10-07 ENCOUNTER — Other Ambulatory Visit: Payer: Self-pay | Admitting: Primary Care

## 2016-10-11 ENCOUNTER — Ambulatory Visit: Payer: Medicare PPO | Admitting: Psychology

## 2016-10-18 ENCOUNTER — Encounter: Payer: Self-pay | Admitting: *Deleted

## 2016-10-25 ENCOUNTER — Encounter: Payer: Self-pay | Admitting: Primary Care

## 2016-10-25 ENCOUNTER — Ambulatory Visit (INDEPENDENT_AMBULATORY_CARE_PROVIDER_SITE_OTHER): Payer: Medicare PPO | Admitting: Primary Care

## 2016-10-25 VITALS — BP 132/84 | HR 85 | Temp 98.2°F | Ht 67.0 in | Wt 282.0 lb

## 2016-10-25 DIAGNOSIS — E119 Type 2 diabetes mellitus without complications: Secondary | ICD-10-CM | POA: Diagnosis not present

## 2016-10-25 DIAGNOSIS — F419 Anxiety disorder, unspecified: Secondary | ICD-10-CM

## 2016-10-25 DIAGNOSIS — I10 Essential (primary) hypertension: Secondary | ICD-10-CM

## 2016-10-25 DIAGNOSIS — R2232 Localized swelling, mass and lump, left upper limb: Secondary | ICD-10-CM | POA: Diagnosis not present

## 2016-10-25 DIAGNOSIS — Z23 Encounter for immunization: Secondary | ICD-10-CM

## 2016-10-25 DIAGNOSIS — E785 Hyperlipidemia, unspecified: Secondary | ICD-10-CM | POA: Diagnosis not present

## 2016-10-25 DIAGNOSIS — F32A Depression, unspecified: Secondary | ICD-10-CM

## 2016-10-25 DIAGNOSIS — F418 Other specified anxiety disorders: Secondary | ICD-10-CM

## 2016-10-25 DIAGNOSIS — F329 Major depressive disorder, single episode, unspecified: Secondary | ICD-10-CM

## 2016-10-25 MED ORDER — ZOSTER VACCINE LIVE 19400 UNT/0.65ML ~~LOC~~ SUSR: 0.6500 mL | Freq: Once | SUBCUTANEOUS | 0 refills | Status: AC

## 2016-10-25 NOTE — Assessment & Plan Note (Signed)
Stopped taking HCTZ.  BP stable today. Continue Losartan and Amlodipine.

## 2016-10-25 NOTE — Assessment & Plan Note (Signed)
Lipids due next week. Continue statin.

## 2016-10-25 NOTE — Progress Notes (Signed)
Pre visit review using our clinic review tool, if applicable. No additional management support is needed unless otherwise documented below in the visit note. 

## 2016-10-25 NOTE — Assessment & Plan Note (Signed)
Has not taken Januvia since December, only took for one month due to cost. Check A1C next week, consider Jard[place] if above goal. Continue Metformin and Glimepiride.

## 2016-10-25 NOTE — Progress Notes (Signed)
Subjective:    Patient ID: Danielle Edwards, female    DOB: December 05, 1955, 61 y.o.   MRN: [id number]  HPI  Danielle Edwards is a 61 year old female who presents today with a chief complaint of upper extremity pain and swelling/bulge. Her pain and bulge is located to the left upper arm that has been present for the past 1 week. She denies heavy lifting, recent injury, long travel. The pain will radiate down to her left thumb occasionally. She denies redness, numbness/tingling, warm feeling, open wounds.  2) Type Diabetes: Currently managed on Glimepiride 4 mg twice daily, Metformin 1000 mg BID, and Januvia 100 mg. She's not taken her Januvia in January as she cannot afford. She took the [place] for a 1 month time span in mid November to mid December. She is due for repeat A1C in 2 weeks. She's not checked her sugars in months as she cannot get the lancets to work correctly.  3) Generalized Anxiety Disorder: Long history of anxiety. She's not taken her Effexor in several months ago as she just didn't feel like taking it any longer. She continues to take Alprazolam 0.5 mg 1-2 times weekly on average. She's not followed with therapy as recommended. Her anxiety has increased over the past several months since she stopped taking Effexor.   Review of Systems  Constitutional: Negative for fever.  Respiratory: Negative for shortness of breath.   Cardiovascular: Negative for chest pain.  Musculoskeletal:       Left upper extremity pain and swelling.  Skin: Negative for color change.  Neurological: Negative for weakness and numbness.  Psychiatric/Behavioral: The patient is nervous/anxious.        Past Medical History:  Diagnosis Date  . Anemia    Iron defiency   . Chronic pain   . Diabetes type 2, controlled ([place])   . Headache   . History of colonic polyps 1999   Benign  . Hx of adenomatous polyp of colon 09/27/2000   2001 - diminutive adenoma [place] 2007 no polyps - [place]  . Hypertension   .  Hypothyroidism   . IBS (irritable bowel syndrome)    Diarrhea type  . Vitamin D deficiency      Social History   Social History  . Marital status: Married    Spouse name: N/A  . Number of children: N/A  . Years of education: N/A   Occupational History  . Not on file.   Social History Main Topics  . Smoking status: Never Smoker  . Smokeless tobacco: Never Used  . Alcohol use 0.0 oz/week     Comment: rarely  . Drug use: No  . Sexual activity: Not on file   Other Topics Concern  . Not on file   Social History Narrative   Married.   3 children.   Retired, worked at [employer] in [place], [place].   Enjoys being with her grandchildren, being with her friends.       Past Surgical History:  Procedure Laterality Date  . CHOLECYSTECTOMY  1995  . COLONOSCOPY  multiple  . NASAL SINUS SURGERY  2001  . TONSILLECTOMY AND ADENOIDECTOMY    . VAGINAL HYSTERECTOMY     Complete    Family History  Problem Relation Age of Onset  . Colon cancer Mother   . Lung cancer Mother   . Skin cancer Mother   . Heart disease Father   . Diabetes Father   . Skin cancer Father     No Known  Allergies  Current Outpatient Prescriptions on File Prior to Visit  Medication Sig Dispense Refill  . ALPRAZolam (XANAX) 0.5 MG tablet Take 1 tablet (0.5 mg total) by mouth daily as needed for anxiety or sleep. 30 tablet 0  . amLODipine (NORVASC) 10 MG tablet TAKE 1 TABLET BY MOUTH DAILY 30 tablet 5  . atorvastatin (LIPITOR) 20 MG tablet Take 1 tablet (20 mg total) by mouth every evening. 90 tablet 1  . Blood Glucose Monitoring Suppl (ONE TOUCH ULTRA SYSTEM KIT) w/Device KIT Use 2 to 3 times daily to check blood sugar. 1 each 0  . glimepiride (AMARYL) 4 MG tablet TAKE 1 TABLET (4 MG TOTAL) BY MOUTH 2 (TWO) TIMES DAILY. 180 tablet 2  . levothyroxine (LEVOTHROID) 25 MCG tablet Take 1 tablet (25 mcg total) by mouth daily before breakfast. 30 tablet 3  . loperamide (IMODIUM A-D) 2 MG tablet Take 2 mg by mouth 4  (four) times daily as needed for diarrhea or loose stools.    Marland Kitchen losartan (COZAAR) 100 MG tablet TAKE 1 TABLET BY MOUTH DAILY 90 tablet 3  . metFORMIN (GLUCOPHAGE) 1000 MG tablet TAKE 1 TABLET (1,000 MG TOTAL) BY MOUTH 2 (TWO) TIMES DAILY WITH A MEAL. 180 tablet 0  . pantoprazole (PROTONIX) 40 MG tablet Take 1 tablet (40 mg total) by mouth daily. 90 tablet 1  . promethazine (PHENERGAN) 25 MG tablet Take 25 mg by mouth every 6 (six) hours as needed. Reported on 09/21/2015  1  . carisoprodol (SOMA) 350 MG tablet Take 350 mg by mouth 2 (two) times daily.  1  . fluticasone (FLONASE) 50 MCG/ACT nasal spray Place 2 sprays into both nostrils daily. (Patient not taking: Reported on 10/25/2016) 16 g 0  . glucose blood test strip Use to check blood sugar 2-3 times daily. (Patient not taking: Reported on 10/25/2016) 100 each 11  . hydrochlorothiazide (MICROZIDE) 12.5 MG capsule TAKE 1 CAPSULE (12.5 MG TOTAL) BY MOUTH DAILY. (Patient not taking: Reported on 10/25/2016) 90 capsule 0  . Lancet Device MISC Use as direct to check blood sugar 2-3 times daily (Patient not taking: Reported on 10/25/2016) 100 each 11  . sitaGLIPtin (JANUVIA) 100 MG tablet Take 1 tablet (100 mg total) by mouth daily. (Patient not taking: Reported on 10/25/2016) 90 tablet 2  . venlafaxine XR (EFFEXOR XR) 37.5 MG 24 hr capsule Take 1 capsule (37.5 mg total) by mouth daily with breakfast. (Patient not taking: Reported on 10/25/2016) 90 capsule 2   No current facility-administered medications on file prior to visit.     BP 132/84   Pulse 85   Temp 98.2 F (36.8 C) (Oral)   Ht 5' 7"  (1.702 m)   Wt 282 lb (127.9 kg)   SpO2 95%   BMI 44.17 kg/m    Objective:   Physical Exam  Constitutional: She appears well-nourished.  Neck: Neck supple.  Cardiovascular: Normal rate.   Pulmonary/Chest: Effort normal.  Skin: Skin is warm and dry. No erythema.  Golf ball sized mass to left medial upper arm distal to deltoid. Soft. No erythema, open  wounds.  Psychiatric: She has a normal mood and affect.          Assessment & Plan:  Upper extremity swelling:  Also with discomfort.  Present x 1 week. Exam today without evidence for DVT, cellulitis, biceps tendon rupture. Feels like fatty tissue accumulation but unsure of how this popped up over 1 week.  [place] ordered for further evaluation. Will await results.  Sheral Flow, NP

## 2016-10-25 NOTE — Patient Instructions (Signed)
Restart your venlafaxine (Effexor) tablets for anxiety.   Please fax me your recent urine drug screen from pain management.  Schedule a lab only appointment for next week, make sure you come fasting at least 4 hours prior to this appointment.  Stop by the front desk and speak with either [facility] regarding your ultrasound.  I will be in touch with you regarding your results once received. Please notify me you see redness, increased pain, weakness.  It was a pleasure to see you today!

## 2016-10-25 NOTE — Assessment & Plan Note (Signed)
Anxiety worse since she stopped Effexor. Discussed to restart. Also needing refill of Xanax. Discussed that I will not refill this medication unless she restarts her Effexor and also brings me her most recent urine drug screen from pain management. She verbalized understanding.

## 2016-10-26 ENCOUNTER — Telehealth: Payer: Self-pay

## 2016-10-26 DIAGNOSIS — R2232 Localized swelling, mass and lump, left upper limb: Secondary | ICD-10-CM

## 2016-10-26 NOTE — Telephone Encounter (Signed)
Amber with [facility] [place] left v/m; received order for [place] misc of soft tissue. Amber said if you want to look for blood clot need to order an US venous upper extremity and if looking for cyst or fatty tissue need separate order for US extremity non vascular. Please advise.

## 2016-10-26 NOTE — Telephone Encounter (Signed)
Noted, correct order placed.

## 2016-10-28 ENCOUNTER — Telehealth: Payer: Self-pay | Admitting: Primary Care

## 2016-10-28 ENCOUNTER — Ambulatory Visit
Admission: RE | Admit: 2016-10-28 | Discharge: 2016-10-28 | Disposition: A | Discharge status: Home / Self Care | Payer: Medicare PPO | Source: Ambulatory Visit | Attending: Primary Care | Admitting: Primary Care

## 2016-10-28 ENCOUNTER — Ambulatory Visit: Admission: RE | Admit: 2016-10-28 | Payer: Medicare PPO | Source: Ambulatory Visit

## 2016-10-28 DIAGNOSIS — R2232 Localized swelling, mass and lump, left upper limb: Secondary | ICD-10-CM | POA: Insufficient documentation

## 2016-10-28 NOTE — Telephone Encounter (Signed)
Please call patient back about her test results. 

## 2016-10-28 NOTE — Telephone Encounter (Signed)
Addressed in results noted.

## 2016-11-02 ENCOUNTER — Other Ambulatory Visit: Payer: Self-pay | Admitting: Primary Care

## 2016-11-02 ENCOUNTER — Other Ambulatory Visit (INDEPENDENT_AMBULATORY_CARE_PROVIDER_SITE_OTHER): Payer: Medicare PPO

## 2016-11-02 ENCOUNTER — Other Ambulatory Visit: Payer: Self-pay

## 2016-11-02 DIAGNOSIS — E119 Type 2 diabetes mellitus without complications: Secondary | ICD-10-CM

## 2016-11-02 DIAGNOSIS — F411 Generalized anxiety disorder: Secondary | ICD-10-CM

## 2016-11-02 DIAGNOSIS — E1165 Type 2 diabetes mellitus with hyperglycemia: Secondary | ICD-10-CM

## 2016-11-02 DIAGNOSIS — E785 Hyperlipidemia, unspecified: Secondary | ICD-10-CM | POA: Diagnosis not present

## 2016-11-02 DIAGNOSIS — E1169 Type 2 diabetes mellitus with other specified complication: Secondary | ICD-10-CM

## 2016-11-02 LAB — LIPID PANEL
CHOLESTEROL: 220 mg/dL — AB (ref 0–200)
HDL: 42.6 mg/dL (ref >39.00)
NonHDL: 177.8
Total CHOL/HDL Ratio: 5
Triglycerides: 283 mg/dL — ABNORMAL HIGH (ref 0.0–149.0)
VLDL: 56.6 mg/dL — ABNORMAL HIGH (ref 0.0–40.0)

## 2016-11-02 LAB — LDL CHOLESTEROL, DIRECT: Direct LDL: 142 mg/dL

## 2016-11-02 LAB — HEMOGLOBIN A1C: Hgb A1c MFr Bld: 8.6 % — ABNORMAL HIGH (ref 4.6–6.5)

## 2016-11-02 MED ORDER — EMPAGLIFLOZIN 25 MG PO TABS: 25.0000 mg | ORAL_TABLET | Freq: Every day | ORAL | 1 refills | Status: DC

## 2016-11-02 MED ORDER — ATORVASTATIN CALCIUM 40 MG PO TABS: 40.0000 mg | ORAL_TABLET | Freq: Every day | ORAL | 1 refills | Status: DC

## 2016-11-02 NOTE — Telephone Encounter (Signed)
Has she restarted her Effexor?

## 2016-11-02 NOTE — Telephone Encounter (Signed)
Pt left note at front desk requesting refill xanax. Pt request cb when refilled. Pt last seen for f/u and rx last refilled #30 on 08/05/16. [facility]

## 2016-11-03 NOTE — Telephone Encounter (Signed)
Pt called back and said she did not restart Effexor but will start this week, had forgotten to take it.

## 2016-11-03 NOTE — Telephone Encounter (Signed)
Message left for patient to return my call.  

## 2016-11-04 NOTE — Telephone Encounter (Signed)
Please notify patient that we will refill her Xanax at 10 tablets per month which means that 30 tablets should last her for 3 months. The Effexor is important and will help her on a daily basis. If she's agreeable to this, then please notify me.

## 2016-11-04 NOTE — Telephone Encounter (Signed)
Spoken and notified patient of [name]'s comments. Patient verbalized understanding and is agreeable to the medication. Patient request to refill the effexor as well.

## 2016-11-04 NOTE — Telephone Encounter (Signed)
Please call patient back about her rx at [phone].

## 2016-11-06 MED ORDER — VENLAFAXINE HCL ER 37.5 MG PO CP24
37.5000 mg | ORAL_CAPSULE | Freq: Every day | ORAL | 2 refills | Status: DC
Start: 2016-11-06 — End: 2017-10-27

## 2016-11-07 NOTE — Telephone Encounter (Signed)
Pt picked up the effexor but xanax was not at pharmacy. Pt request cb about status of xanax refill.

## 2016-11-07 NOTE — Telephone Encounter (Signed)
Can I phone in the Xanax?

## 2016-11-08 MED ORDER — ALPRAZOLAM 0.5 MG PO TABS: 0.5000 mg | ORAL_TABLET | Freq: Every day | ORAL | 0 refills | Status: DC | PRN

## 2016-11-08 NOTE — Telephone Encounter (Signed)
Called in medication to the pharmacy as instructed. 

## 2016-11-08 NOTE — Telephone Encounter (Signed)
Spoken patient and notified her that the medication has been called in.

## 2016-11-08 NOTE — Addendum Note (Signed)
Addended by: Jacqualin Combes on: 11/08/2016 11:34 AM   Modules accepted: Orders

## 2016-11-08 NOTE — Telephone Encounter (Signed)
Please phone in alprazolam 0.5 mg. Take 1 tablet by mouth once daily as needed for anxiety. #30, no refills. This should last for 3 months.

## 2016-11-27 ENCOUNTER — Other Ambulatory Visit: Payer: Self-pay | Admitting: Primary Care

## 2016-12-01 ENCOUNTER — Telehealth: Payer: Self-pay | Admitting: Primary Care

## 2016-12-01 NOTE — Telephone Encounter (Signed)
Spoke to pt. She will call back to schedule AWV and CPE

## 2016-12-19 ENCOUNTER — Ambulatory Visit (INDEPENDENT_AMBULATORY_CARE_PROVIDER_SITE_OTHER): Payer: Medicare PPO | Admitting: Psychology

## 2016-12-19 DIAGNOSIS — F411 Generalized anxiety disorder: Secondary | ICD-10-CM

## 2016-12-26 NOTE — Telephone Encounter (Signed)
Attempted to call pt. Voicemail full.

## 2016-12-29 ENCOUNTER — Other Ambulatory Visit: Payer: Self-pay | Admitting: Family Medicine

## 2016-12-29 DIAGNOSIS — E119 Type 2 diabetes mellitus without complications: Secondary | ICD-10-CM

## 2016-12-29 NOTE — Telephone Encounter (Signed)
Ok to refill? Electronically refill request for metFORMIN (GLUCOPHAGE) 1000 MG tablet. Last prescribed on 09/16/2016. Last seen on 10/25/2016

## 2017-01-01 ENCOUNTER — Other Ambulatory Visit: Payer: Self-pay | Admitting: Primary Care

## 2017-01-17 ENCOUNTER — Encounter (HOSPITAL_COMMUNITY): Payer: Self-pay | Admitting: Family Medicine

## 2017-01-17 ENCOUNTER — Ambulatory Visit (HOSPITAL_COMMUNITY)
Admission: EM | Admit: 2017-01-17 | Discharge: 2017-01-17 | Disposition: A | Discharge status: Home / Self Care | Payer: Medicare PPO | Attending: Internal Medicine | Admitting: Internal Medicine

## 2017-01-17 DIAGNOSIS — R69 Illness, unspecified: Secondary | ICD-10-CM

## 2017-01-17 DIAGNOSIS — J111 Influenza due to unidentified influenza virus with other respiratory manifestations: Secondary | ICD-10-CM

## 2017-01-17 MED ORDER — ONDANSETRON 4 MG PO TBDP: 4.0000 mg | ORAL_TABLET | Freq: Three times a day (TID) | ORAL | 0 refills | Status: DC | PRN

## 2017-01-17 MED ORDER — OSELTAMIVIR PHOSPHATE 75 MG PO CAPS: 75.0000 mg | ORAL_CAPSULE | Freq: Two times a day (BID) | ORAL | 0 refills | Status: DC

## 2017-01-17 MED ORDER — BENZONATATE 100 MG PO CAPS: 100.0000 mg | ORAL_CAPSULE | Freq: Three times a day (TID) | ORAL | 0 refills | Status: DC

## 2017-01-17 NOTE — ED Provider Notes (Signed)
CSN: [id number]     Arrival date & time 01/17/17  1911 History   First MD Initiated Contact with Patient 01/17/17 1959     Chief Complaint  Patient presents with  . Sore Throat  . Weakness   (Consider location/radiation/quality/duration/timing/severity/associated sxs/prior Treatment) 61 year old female presents to clinic for evaluation of flulike illness. Started earlier today, has been caring for her granddaughter who tested positive for influenza B.   The history is provided by the patient.  URI  Presenting symptoms: congestion, cough, fatigue, fever and sore throat   Presenting symptoms: no rhinorrhea   Congestion:    Location:  Nasal   Interferes with sleep: no     Interferes with eating/drinking: no   Cough:    Cough characteristics:  Productive and hacking   Sputum characteristics:  Clear   Severity:  Mild   Onset quality:  Gradual   Duration:  1 day   Timing:  Intermittent   Progression:  Unchanged   Chronicity:  New Severity:  Moderate Onset quality:  Sudden Duration:  1 day Timing:  Constant Progression:  Worsening Chronicity:  New Relieved by:  Nothing Ineffective treatments:  None tried Associated symptoms: headaches, myalgias and swollen glands   Associated symptoms: no arthralgias, no neck pain, no sinus pain, no sneezing and no wheezing   Risk factors: diabetes mellitus and sick contacts     Past Medical History:  Diagnosis Date  . Anemia    Iron defiency   . Chronic pain   . Diabetes type 2, controlled ([place])   . Headache   . History of colonic polyps 1999   Benign  . Hx of adenomatous polyp of colon 09/27/2000   2001 - diminutive adenoma [place] 2007 no polyps - [place]  . Hypertension   . Hypothyroidism   . IBS (irritable bowel syndrome)    Diarrhea type  . Vitamin D deficiency    Past Surgical History:  Procedure Laterality Date  . CHOLECYSTECTOMY  1995  . COLONOSCOPY  multiple  . NASAL SINUS SURGERY  2001  . TONSILLECTOMY AND  ADENOIDECTOMY    . VAGINAL HYSTERECTOMY     Complete   Family History  Problem Relation Age of Onset  . Colon cancer Mother   . Lung cancer Mother   . Skin cancer Mother   . Heart disease Father   . Diabetes Father   . Skin cancer Father    Social History  Substance Use Topics  . Smoking status: Never Smoker  . Smokeless tobacco: Never Used  . Alcohol use 0.0 oz/week     Comment: rarely   OB History    No data available     Review of Systems  Constitutional: Positive for appetite change, chills, fatigue and fever.  HENT: Positive for congestion and sore throat. Negative for rhinorrhea, sinus pain and sneezing.   Eyes: Negative.   Respiratory: Positive for cough. Negative for shortness of breath and wheezing.   Cardiovascular: Negative for chest pain and palpitations.  Gastrointestinal: Positive for nausea and vomiting. Negative for abdominal pain, constipation and diarrhea.  Genitourinary: Negative.   Musculoskeletal: Positive for myalgias. Negative for arthralgias and neck pain.  Skin: Negative.   Neurological: Positive for headaches. Negative for dizziness.  All other systems reviewed and are negative.   Allergies  Patient has no known allergies.  Home Medications   Prior to Admission medications   Medication Sig Start Date End Date Taking? Authorizing Provider  ALPRAZolam Duanne Moron) 0.5 MG tablet Take 1  tablet (0.5 mg total) by mouth daily as needed for anxiety. 11/08/16   Pleas Koch, NP  amLODipine (NORVASC) 10 MG tablet TAKE 1 TABLET BY MOUTH DAILY 01/02/17   Pleas Koch, NP  atorvastatin (LIPITOR) 40 MG tablet Take 1 tablet (40 mg total) by mouth at bedtime. 11/02/16   Pleas Koch, NP  benzonatate (TESSALON) 100 MG capsule Take 1 capsule (100 mg total) by mouth every 8 (eight) hours. 01/17/17   Barnet Glasgow, NP  Blood Glucose Monitoring Suppl (ONE TOUCH ULTRA SYSTEM KIT) w/Device KIT Use 2 to 3 times daily to check blood sugar. 01/20/16   Pleas Koch, NP  carisoprodol (SOMA) 350 MG tablet Take 350 mg by mouth 2 (two) times daily. 06/24/15   Historical Provider, MD  empagliflozin (JARDIANCE) 25 MG TABS tablet Take 25 mg by mouth daily. 11/02/16   Pleas Koch, NP  fluticasone (FLONASE) 50 MCG/ACT nasal spray Place 2 sprays into both nostrils daily. Patient not taking: Reported on 10/25/2016 02/10/15   Pleas Koch, NP  glimepiride (AMARYL) 4 MG tablet TAKE 1 TABLET (4 MG TOTAL) BY MOUTH 2 (TWO) TIMES DAILY. 02/05/16   Pleas Koch, NP  glucose blood test strip Use to check blood sugar 2-3 times daily. Patient not taking: Reported on 10/25/2016 01/20/16   Pleas Koch, NP  hydrochlorothiazide (MICROZIDE) 12.5 MG capsule TAKE 1 CAPSULE (12.5 MG TOTAL) BY MOUTH DAILY. Patient not taking: Reported on 10/25/2016 10/12/15   Pleas Koch, NP  Lancet Device MISC Use as direct to check blood sugar 2-3 times daily Patient not taking: Reported on 10/25/2016 01/20/16   Pleas Koch, NP  levothyroxine (LEVOTHROID) 25 MCG tablet Take 1 tablet (25 mcg total) by mouth daily before breakfast. 04/13/15   Pleas Koch, NP  loperamide (IMODIUM A-D) 2 MG tablet Take 2 mg by mouth 4 (four) times daily as needed for diarrhea or loose stools.    Historical Provider, MD  losartan (COZAAR) 100 MG tablet TAKE 1 TABLET BY MOUTH DAILY 10/07/16   Pleas Koch, NP  metFORMIN (GLUCOPHAGE) 1000 MG tablet Take 1 tablet (1,000 mg total) by mouth 2 (two) times daily with a meal. 12/29/16   Pleas Koch, NP  ondansetron (ZOFRAN ODT) 4 MG disintegrating tablet Take 1 tablet (4 mg total) by mouth every 8 (eight) hours as needed for nausea or vomiting. 01/17/17   Barnet Glasgow, NP  oseltamivir (TAMIFLU) 75 MG capsule Take 1 capsule (75 mg total) by mouth every 12 (twelve) hours. 01/17/17   Barnet Glasgow, NP  oxyCODONE (OXY IR/ROXICODONE) 5 MG immediate release tablet Take 5 mg by mouth 4 (four) times daily as needed. 10/24/16   Historical  Provider, MD  pantoprazole (PROTONIX) 40 MG tablet Take 1 tablet (40 mg total) by mouth daily. 06/21/16   Pleas Koch, NP  promethazine (PHENERGAN) 25 MG tablet Take 25 mg by mouth every 6 (six) hours as needed. Reported on 09/21/2015 07/03/15   Historical Provider, MD  sitaGLIPtin (JANUVIA) 100 MG tablet Take 1 tablet (100 mg total) by mouth daily. Patient not taking: Reported on 10/25/2016 10/05/16   Pleas Koch, NP  venlafaxine XR (EFFEXOR XR) 37.5 MG 24 hr capsule Take 1 capsule (37.5 mg total) by mouth daily with breakfast. 11/06/16   Pleas Koch, NP   Meds Ordered and Administered this Visit  Medications - No data to display  BP (!) 131/58 (BP Location: Right Arm) Comment: notified  rn  Pulse 85   Temp 98.4 F (36.9 C) (Oral)   Resp 14   SpO2 96%  No data found.   Physical Exam  Constitutional: She is oriented to person, place, and time. She appears well-developed and well-nourished. She appears ill. No distress.  HENT:  Head: Normocephalic and atraumatic.  Right Ear: Tympanic membrane and external ear normal.  Left Ear: Tympanic membrane and external ear normal.  Nose: Rhinorrhea present. Right sinus exhibits no maxillary sinus tenderness and no frontal sinus tenderness. Left sinus exhibits no maxillary sinus tenderness and no frontal sinus tenderness.  Mouth/Throat: Uvula is midline, oropharynx is clear and moist and mucous membranes are normal. No oropharyngeal exudate.  Eyes: Conjunctivae are normal. Right eye exhibits no discharge. Left eye exhibits no discharge.  Neck: Normal range of motion. Neck supple. No JVD present.  Cardiovascular: Normal rate and regular rhythm.   Pulmonary/Chest: Effort normal and breath sounds normal. No respiratory distress. She has no wheezes.  Abdominal: Soft. Bowel sounds are normal. She exhibits no distension. There is no tenderness. There is no guarding.  Lymphadenopathy:       Head (right side): Tonsillar adenopathy present. No  submental and no submandibular adenopathy present.       Head (left side): Tonsillar adenopathy present. No submental and no submandibular adenopathy present.    She has no cervical adenopathy.  Neurological: She is alert and oriented to person, place, and time.  Skin: Skin is warm and dry. Capillary refill takes less than 2 seconds. She is not diaphoretic.  Psychiatric: She has a normal mood and affect. Her behavior is normal.  Nursing note and vitals reviewed.   Urgent Care Course     Procedures (including critical care time)  Labs Review Labs Reviewed - No data to display  Imaging Review No results found.     MDM   1. Influenza-like illness     Treating for influenza, started on Tamiflu, given Tessalon for cough, Zofran for nausea, provided counseling on over-the-counter therapies for symptom management. Advised rest, fluids, follow-up with primary care symptoms persist past one week, or go to the ER at any time symptoms worsen.     Barnet Glasgow, NP 01/17/17 2016

## 2017-01-17 NOTE — Discharge Instructions (Signed)
You most likely have a the flu, or a flulike illness. I advise rest, plenty of fluids and management of symptoms with over the counter medicines. For symptoms you may take Tylenol as needed every 4-6 hours for body aches or fever, not to exceed 4,000 mg a day, Take mucinex or mucinex DM ever 12 hours with a full glass of water, you may use an inhaled steroid such as Flonase, 2 sprays each nostril once a day for congestion, or an antihistamine such as Claritin or Zyrtec once a day.For treatment of influenza, I have prescribed Tamiflu. Take 1 tablet twice a day for 5 days. For cough, I have prescribed a medication called Tessalon. Take 1 tablet every 8 hours as needed for your cough. For Nausea, I have prescribed Zofran, take 1 tablet under the tongue every 8 hours as needed. Should your symptoms worsen or fail to resolve, follow up with your primary care provider or return to clinic.

## 2017-01-17 NOTE — ED Triage Notes (Signed)
Pt here presenting today with flu like symptoms that started today. sts that she has been taking care of her granddaughter that tested positive for flu B.

## 2017-01-25 ENCOUNTER — Ambulatory Visit (INDEPENDENT_AMBULATORY_CARE_PROVIDER_SITE_OTHER): Payer: Medicare PPO | Admitting: Psychology

## 2017-01-25 DIAGNOSIS — F411 Generalized anxiety disorder: Secondary | ICD-10-CM

## 2017-02-01 ENCOUNTER — Other Ambulatory Visit: Payer: Self-pay | Admitting: Primary Care

## 2017-02-01 DIAGNOSIS — E119 Type 2 diabetes mellitus without complications: Secondary | ICD-10-CM

## 2017-02-03 ENCOUNTER — Ambulatory Visit (INDEPENDENT_AMBULATORY_CARE_PROVIDER_SITE_OTHER): Payer: Medicare PPO | Admitting: Psychology

## 2017-02-03 DIAGNOSIS — F411 Generalized anxiety disorder: Secondary | ICD-10-CM | POA: Diagnosis not present

## 2017-02-17 ENCOUNTER — Ambulatory Visit (INDEPENDENT_AMBULATORY_CARE_PROVIDER_SITE_OTHER): Payer: Medicare PPO | Admitting: Psychology

## 2017-02-17 DIAGNOSIS — F411 Generalized anxiety disorder: Secondary | ICD-10-CM | POA: Diagnosis not present

## 2017-03-01 ENCOUNTER — Ambulatory Visit (INDEPENDENT_AMBULATORY_CARE_PROVIDER_SITE_OTHER): Payer: Medicare PPO | Admitting: Psychology

## 2017-03-01 DIAGNOSIS — F331 Major depressive disorder, recurrent, moderate: Secondary | ICD-10-CM | POA: Diagnosis not present

## 2017-03-01 DIAGNOSIS — F411 Generalized anxiety disorder: Secondary | ICD-10-CM | POA: Diagnosis not present

## 2017-04-07 ENCOUNTER — Ambulatory Visit: Payer: Medicare PPO | Admitting: Psychology

## 2017-04-13 ENCOUNTER — Other Ambulatory Visit: Payer: Self-pay

## 2017-04-13 DIAGNOSIS — K219 Gastro-esophageal reflux disease without esophagitis: Secondary | ICD-10-CM

## 2017-04-13 NOTE — Telephone Encounter (Signed)
Has she tried reducing down to the 20 mg of pantoprazole recently? The 40 mg is a very high dose, it would be worth trying her on 20 mg to see if this is sufficient enough to control symptoms. Let me know.

## 2017-04-13 NOTE — Telephone Encounter (Signed)
Pt left vm requesting a refill to [facility].  ok to refill? Last refill 06/21/16 Last 08/05/16

## 2017-04-14 MED ORDER — PANTOPRAZOLE SODIUM 20 MG PO TBEC: 20.0000 mg | DELAYED_RELEASE_TABLET | Freq: Every day | ORAL | 0 refills | Status: DC

## 2017-04-14 NOTE — Telephone Encounter (Signed)
Noted, 20 mg tablets sent to pharmacy for 90 day supply.

## 2017-04-14 NOTE — Telephone Encounter (Signed)
Message left for patient to return my call.  

## 2017-04-14 NOTE — Telephone Encounter (Signed)
Spoken to patient and she stated that she will try the 20 mg instead of 40 mg.  She asked if [facility] can send 90 days still.

## 2017-04-14 NOTE — Telephone Encounter (Signed)
Pt returned call  She wants protonix refilled to [facility] for 90 days

## 2017-04-20 ENCOUNTER — Other Ambulatory Visit: Payer: Self-pay | Admitting: Primary Care

## 2017-04-20 DIAGNOSIS — K219 Gastro-esophageal reflux disease without esophagitis: Secondary | ICD-10-CM

## 2017-05-07 ENCOUNTER — Other Ambulatory Visit: Payer: Self-pay | Admitting: Primary Care

## 2017-05-11 ENCOUNTER — Ambulatory Visit: Payer: Medicare PPO | Admitting: Psychology

## 2017-06-04 ENCOUNTER — Other Ambulatory Visit: Payer: Self-pay | Admitting: Primary Care

## 2017-06-04 DIAGNOSIS — E1165 Type 2 diabetes mellitus with hyperglycemia: Secondary | ICD-10-CM

## 2017-06-24 ENCOUNTER — Other Ambulatory Visit: Payer: Self-pay | Admitting: Primary Care

## 2017-08-02 ENCOUNTER — Other Ambulatory Visit: Payer: Self-pay | Admitting: Primary Care

## 2017-08-02 DIAGNOSIS — E119 Type 2 diabetes mellitus without complications: Secondary | ICD-10-CM

## 2017-09-18 ENCOUNTER — Other Ambulatory Visit: Payer: Self-pay | Admitting: Primary Care

## 2017-09-18 DIAGNOSIS — K219 Gastro-esophageal reflux disease without esophagitis: Secondary | ICD-10-CM

## 2017-09-30 ENCOUNTER — Other Ambulatory Visit: Payer: Self-pay | Admitting: Primary Care

## 2017-10-25 ENCOUNTER — Other Ambulatory Visit: Payer: Self-pay | Admitting: Primary Care

## 2017-10-25 DIAGNOSIS — K219 Gastro-esophageal reflux disease without esophagitis: Secondary | ICD-10-CM

## 2017-10-27 ENCOUNTER — Other Ambulatory Visit: Payer: Self-pay | Admitting: Primary Care

## 2017-10-27 ENCOUNTER — Encounter: Payer: Self-pay | Admitting: Primary Care

## 2017-10-27 ENCOUNTER — Ambulatory Visit: Payer: Medicare PPO | Admitting: Primary Care

## 2017-10-27 ENCOUNTER — Ambulatory Visit: Payer: Self-pay | Admitting: Primary Care

## 2017-10-27 VITALS — BP 142/78 | HR 68 | Temp 98.4°F | Wt 284.8 lb

## 2017-10-27 DIAGNOSIS — F411 Generalized anxiety disorder: Secondary | ICD-10-CM | POA: Diagnosis not present

## 2017-10-27 DIAGNOSIS — E1169 Type 2 diabetes mellitus with other specified complication: Secondary | ICD-10-CM

## 2017-10-27 DIAGNOSIS — E039 Hypothyroidism, unspecified: Secondary | ICD-10-CM

## 2017-10-27 DIAGNOSIS — E119 Type 2 diabetes mellitus without complications: Secondary | ICD-10-CM | POA: Diagnosis not present

## 2017-10-27 DIAGNOSIS — G894 Chronic pain syndrome: Secondary | ICD-10-CM

## 2017-10-27 DIAGNOSIS — F419 Anxiety disorder, unspecified: Secondary | ICD-10-CM | POA: Diagnosis not present

## 2017-10-27 DIAGNOSIS — N898 Other specified noninflammatory disorders of vagina: Secondary | ICD-10-CM

## 2017-10-27 DIAGNOSIS — E559 Vitamin D deficiency, unspecified: Secondary | ICD-10-CM

## 2017-10-27 DIAGNOSIS — R300 Dysuria: Secondary | ICD-10-CM | POA: Diagnosis not present

## 2017-10-27 DIAGNOSIS — B3731 Acute candidiasis of vulva and vagina: Secondary | ICD-10-CM

## 2017-10-27 DIAGNOSIS — I10 Essential (primary) hypertension: Secondary | ICD-10-CM

## 2017-10-27 DIAGNOSIS — B373 Candidiasis of vulva and vagina: Secondary | ICD-10-CM

## 2017-10-27 DIAGNOSIS — F329 Major depressive disorder, single episode, unspecified: Secondary | ICD-10-CM

## 2017-10-27 DIAGNOSIS — E785 Hyperlipidemia, unspecified: Secondary | ICD-10-CM | POA: Diagnosis not present

## 2017-10-27 LAB — POC URINALSYSI DIPSTICK (AUTOMATED)
BILIRUBIN UA: NEGATIVE
Blood, UA: NEGATIVE
Ketones, UA: NEGATIVE
LEUKOCYTES UA: NEGATIVE
NITRITE UA: NEGATIVE
Protein, UA: NEGATIVE
Spec Grav, UA: 1.015 (ref 1.010–1.025)
UROBILINOGEN UA: 0.2 U/dL
pH, UA: 6 (ref 5.0–8.0)

## 2017-10-27 MED ORDER — METFORMIN HCL 1000 MG PO TABS: 1000.0000 mg | ORAL_TABLET | Freq: Two times a day (BID) | ORAL | 2 refills | Status: DC

## 2017-10-27 MED ORDER — AMLODIPINE BESYLATE 10 MG PO TABS: 10.0000 mg | ORAL_TABLET | Freq: Every day | ORAL | 3 refills | Status: DC

## 2017-10-27 MED ORDER — LEVOTHYROXINE SODIUM 25 MCG PO TABS: 25.0000 ug | ORAL_TABLET | Freq: Every day | ORAL | 0 refills | Status: DC

## 2017-10-27 MED ORDER — ACCU-CHEK FASTCLIX LANCETS MISC: 1 refills | Status: DC

## 2017-10-27 MED ORDER — HYDROCHLOROTHIAZIDE 12.5 MG PO CAPS: ORAL_CAPSULE | ORAL | 3 refills | Status: DC

## 2017-10-27 MED ORDER — GLUCOSE BLOOD VI STRP: ORAL_STRIP | 1 refills | Status: DC

## 2017-10-27 MED ORDER — LOSARTAN POTASSIUM 100 MG PO TABS: 100.0000 mg | ORAL_TABLET | Freq: Every day | ORAL | 3 refills | Status: DC

## 2017-10-27 MED ORDER — FLUCONAZOLE 150 MG PO TABS: ORAL_TABLET | ORAL | 0 refills | Status: DC

## 2017-10-27 MED ORDER — ATORVASTATIN CALCIUM 40 MG PO TABS: 40.0000 mg | ORAL_TABLET | Freq: Every day | ORAL | 3 refills | Status: DC

## 2017-10-27 MED ORDER — ACCU-CHEK AVIVA PLUS W/DEVICE KIT: PACK | 0 refills | Status: DC

## 2017-10-27 MED ORDER — VENLAFAXINE HCL ER 37.5 MG PO CP24: 37.5000 mg | ORAL_CAPSULE | Freq: Every day | ORAL | 3 refills | Status: DC

## 2017-10-27 NOTE — Assessment & Plan Note (Signed)
Reinitiated venlafaxine at 37.5 mg as she felt improved on this medication.  Follow-up in 3 months.

## 2017-10-27 NOTE — Assessment & Plan Note (Signed)
Continues to follow with pain management 

## 2017-10-27 NOTE — Assessment & Plan Note (Signed)
No recent use of levothyroxine 25 mcg.  TSH pending.  We will have her resume levothyroxine 25 micro grams for now.  Will likely need to repeat TSH in 6 weeks.

## 2017-10-27 NOTE — Progress Notes (Signed)
Subjective:    Patient ID: Danielle Edwards, female    DOB: June 21, 1956, 62 y.o.   MRN: [id number]  HPI  Danielle Edwards is a 62 year old female who presents today for follow up and a chief complaint of vaginal itching.  She has not been seen in our clinic in 1 year.  1) Type 2 Diabetes:  Current medications include: Jardiance 25 mg, glimepiride 4 mg BID, metformin 100 mg BID. She stopped taking Januvia due to cost. She was out of Jardiance for a few months this past Fall and has since resumed in early 2019.  She's not checking her blood sugars as her meter is broken.  Last A1C: 8.6 in January 2018 Last Eye Exam: No recent exam.  Last Foot Exam: Due today Pneumonia Vaccination: Completed in 2016 ACE/ARB: Losartan Statin: atorvastatin 40 mg  Diet currently consists of:  Breakfast: Skips Lunch: Fast food Dinner: Burgers, pizza, hot dogs, french fries Snacks: Crackers, cheese Desserts: Daily (ice cream) Beverages: Milk, Soda (regular, diet), water  Exercise: She does not exercise.  2) Essential Hypertension: Currently managed on losartan 100 mg, amlodipine 10 mg, HCTZ 12.5 mg. She does not check her blood pressure at home. She's not been taking her HCTZ 12.5 mg in several months due to her urinary and vaginal symptoms.  BP Readings from Last 3 Encounters:  10/27/17 (!) 142/78  01/17/17 (!) 131/58  10/25/16 132/84     3) GAD: Currently managed on venlafaxine ER 37.5 mg. No use of alprazolam in one year. She's not taken venlafaxine in several months as she ran out. She would like to resume this as it was helpful in the beginning, but did notice that it wasn't as helpful after she'd been taken it for several months. She denies SI/HI.  4) Hyperlipidemia: Currently managed on atorvastatin 40 mg. Lipid panel in 2018 with TC of 220 and LDL of 142. She's not had her atorvastatin in several months as she was told it was a "bad drug".  5) Vaginal Itching: Also with dysuria, pelvic  discomfort, bubbles/froth in her urine. She denies recent antibiotic use, fevers, vaginal discharge/vaginal bleeding. Her symptoms have been present for the past 3-4 months. She's not used or taken anything OTC.   6) Hypothyroidism: Currently managed on levothyroxine 25 mcg. She's not taken this medication in several months.  Overall she is feeling fatigued and "bad".   Review of Systems  Constitutional: Positive for fatigue.  Eyes: Negative for visual disturbance.  Respiratory: Negative for shortness of breath.   Cardiovascular: Negative for chest pain.  Genitourinary: Positive for dysuria. Negative for frequency, pelvic pain, vaginal bleeding and vaginal discharge.       Vaginal itching, frothy urine, dysuria.  Neurological: Negative for dizziness and headaches.  Psychiatric/Behavioral:       Has noticed anxiety since off venlafaxine.       Past Medical History:  Diagnosis Date  . Anemia    Iron defiency   . Chronic pain   . Diabetes type 2, controlled ([place])   . Headache   . History of colonic polyps 1999   Benign  . Hx of adenomatous polyp of colon 09/27/2000   2001 - diminutive adenoma [place] 2007 no polyps - [place]  . Hypertension   . Hypothyroidism   . IBS (irritable bowel syndrome)    Diarrhea type  . Vitamin D deficiency      Social History   Socioeconomic History  . Marital status: Married    Spouse  name: Not on file  . Number of children: Not on file  . Years of education: Not on file  . Highest education level: Not on file  Social Needs  . Financial resource strain: Not on file  . Food insecurity - worry: Not on file  . Food insecurity - inability: Not on file  . Transportation needs - medical: Not on file  . Transportation needs - non-medical: Not on file  Occupational History  . Not on file  Tobacco Use  . Smoking status: Never Smoker  . Smokeless tobacco: Never Used  Substance and Sexual Activity  . Alcohol use: Yes    Alcohol/week: 0.0 oz      Comment: rarely  . Drug use: No  . Sexual activity: Not on file  Other Topics Concern  . Not on file  Social History Narrative   Married.   3 children.   Retired, worked at [employer] in [place], [place].   Enjoys being with her grandchildren, being with her friends.    Past Surgical History:  Procedure Laterality Date  . CHOLECYSTECTOMY  1995  . COLONOSCOPY  multiple  . NASAL SINUS SURGERY  2001  . TONSILLECTOMY AND ADENOIDECTOMY    . VAGINAL HYSTERECTOMY     Complete    Family History  Problem Relation Age of Onset  . Colon cancer Mother   . Lung cancer Mother   . Skin cancer Mother   . Heart disease Father   . Diabetes Father   . Skin cancer Father     No Known Allergies  Current Outpatient Medications on File Prior to Visit  Medication Sig Dispense Refill  . empagliflozin (JARDIANCE) 25 MG TABS tablet Take 25 mg by mouth daily. NEED FOLLOW UP FOR ANY MORE REFILLS 90 tablet 0  . fluticasone (FLONASE) 50 MCG/ACT nasal spray Place 2 sprays into both nostrils daily. 16 g 0  . glimepiride (AMARYL) 4 MG tablet Take 1 tablet (4 mg total) by mouth 2 (two) times daily. NEED OFFICE APPOINTMENT FOR ANY MORE REFILLS 180 tablet 0  . loperamide (IMODIUM A-D) 2 MG tablet Take 2 mg by mouth 4 (four) times daily as needed for diarrhea or loose stools.    . ondansetron (ZOFRAN ODT) 4 MG disintegrating tablet Take 1 tablet (4 mg total) by mouth every 8 (eight) hours as needed for nausea or vomiting. 20 tablet 0  . oxyCODONE (OXY IR/ROXICODONE) 5 MG immediate release tablet Take 5 mg by mouth 4 (four) times daily as needed.    . pantoprazole (PROTONIX) 20 MG tablet Take 1 tablet (20 mg total) by mouth daily. NEED OFFICE VISIT FOR ANY MORE REFILLS 30 tablet 0  . promethazine (PHENERGAN) 25 MG tablet Take 25 mg by mouth every 6 (six) hours as needed. Reported on 09/21/2015  1  . sitaGLIPtin (JANUVIA) 100 MG tablet Take 1 tablet (100 mg total) by mouth daily. 90 tablet 2   No current  facility-administered medications on file prior to visit.     BP (!) 142/78   Pulse 68   Temp 98.4 F (36.9 C) (Oral)   Wt 284 lb 12.8 oz (129.2 kg)   SpO2 97%   BMI 44.61 kg/m    Objective:   Physical Exam  Constitutional: She appears well-nourished.  Neck: Neck supple.  Cardiovascular: Normal rate and regular rhythm.  Pulmonary/Chest: Effort normal and breath sounds normal.  Genitourinary: There is no tenderness or lesion on the right labia. There is no tenderness or lesion on  the left labia. Right adnexum displays no tenderness. Left adnexum displays no tenderness. There is erythema in the vagina. No vaginal discharge found.  Skin: Skin is warm and dry.  Psychiatric: She has a normal mood and affect.          Assessment & Plan:  Vaginal yeast infection:  Symptoms of vaginal itching, dysuria, pelvic pressure. UA today: Negative for leuks and nitrites and blood.  3+ glucose. Wet prep today noted yeast cells, will send off for additional evaluation. Prescription for Diflucan 150 mg sent to pharmacy.  May repeat in 3 days if no resolved. Discussed importance of gaining control of her blood sugars through diet and medication management.  A1c pending.  Sheral Flow, NP

## 2017-10-27 NOTE — Assessment & Plan Note (Signed)
Repeat vitamin D pending. 

## 2017-10-27 NOTE — Assessment & Plan Note (Deleted)
Lipid panel from 2018 above goal.  Lipid panel pending, she will return as she is not fasting today.  Resume atorvastatin 40 mg, refill sent to pharmacy.

## 2017-10-27 NOTE — Assessment & Plan Note (Signed)
Lipid panel from 2018 above goal.  Lipid panel pending, she will return as she is not fasting today.  Resume atorvastatin 40 mg, refill sent to pharmacy.

## 2017-10-27 NOTE — Assessment & Plan Note (Signed)
Above goal in the office today. Continue amlodipine, losartan, resume HCTZ.  Refill sent for all medications.

## 2017-10-27 NOTE — Patient Instructions (Addendum)
Resume your atorvastatin 40 mg, hydrochlorothiazide 12.5 mg, venlafaxine 37.5 mg, levothyroxine 25 mcg. Continue all of your other medications as directed.  You MUST improve your diet. Please limit carbohydrates in the form of white bread, rice, pasta, sweets, fast food, fried food, sugary drinks, etc. Increase your consumption of fresh fruits and vegetables, whole grains, lean protein.  Ensure you are consuming 64 ounces of water daily.  You will be contacted regarding your referral to Diabetes nutrition.  Please let us know if you have not been contacted within one week.   Schedule a follow up visit in three months for diabetes check.  Schedule a lab only appointment to return fasting. Do not eat 8 hours prior to this appointment. You may have water and black coffee only.  It was a pleasure to see you today!   Diabetes Mellitus and Nutrition When you have diabetes (diabetes mellitus), it is very important to have healthy eating habits because your blood sugar (glucose) levels are greatly affected by what you eat and drink. Eating healthy foods in the appropriate amounts, at about the same times every day, can help you:  Control your blood glucose.  Lower your risk of heart disease.  Improve your blood pressure.  Reach or maintain a healthy weight.  Every person with diabetes is different, and each person has different needs for a meal plan. Your health care provider may recommend that you work with a diet and nutrition specialist (dietitian) to make a meal plan that is best for you. Your meal plan may vary depending on factors such as:  The calories you need.  The medicines you take.  Your weight.  Your blood glucose, blood pressure, and cholesterol levels.  Your activity level.  Other health conditions you have, such as heart or kidney disease.  How do carbohydrates affect me? Carbohydrates affect your blood glucose level more than any other type of food. Eating  carbohydrates naturally increases the amount of glucose in your blood. Carbohydrate counting is a method for keeping track of how many carbohydrates you eat. Counting carbohydrates is important to keep your blood glucose at a healthy level, especially if you use insulin or take certain oral diabetes medicines. It is important to know how many carbohydrates you can safely have in each meal. This is different for every person. Your dietitian can help you calculate how many carbohydrates you should have at each meal and for snack. Foods that contain carbohydrates include:  Bread, cereal, rice, pasta, and crackers.  Potatoes and corn.  Peas, beans, and lentils.  Milk and yogurt.  Fruit and juice.  Desserts, such as cakes, cookies, ice cream, and candy.  How does alcohol affect me? Alcohol can cause a sudden decrease in blood glucose (hypoglycemia), especially if you use insulin or take certain oral diabetes medicines. Hypoglycemia can be a life-threatening condition. Symptoms of hypoglycemia (sleepiness, dizziness, and confusion) are similar to symptoms of having too much alcohol. If your health care provider says that alcohol is safe for you, follow these guidelines:  Limit alcohol intake to no more than 1 drink per day for nonpregnant women and 2 drinks per day for men. One drink equals 12 oz of beer, 5 oz of wine, or 1 oz of hard liquor.  Do not drink on an empty stomach.  Keep yourself hydrated with water, diet soda, or unsweetened iced tea.  Keep in mind that regular soda, juice, and other mixers may contain a lot of sugar and must be  counted as carbohydrates.  What are tips for following this plan? Reading food labels  Start by checking the serving size on the label. The amount of calories, carbohydrates, fats, and other nutrients listed on the label are based on one serving of the food. Many foods contain more than one serving per package.  Check the total grams (g) of  carbohydrates in one serving. You can calculate the number of servings of carbohydrates in one serving by dividing the total carbohydrates by 15. For example, if a food has 30 g of total carbohydrates, it would be equal to 2 servings of carbohydrates.  Check the number of grams (g) of saturated and trans fats in one serving. Choose foods that have low or no amount of these fats.  Check the number of milligrams (mg) of sodium in one serving. Most people should limit total sodium intake to less than 2,300 mg per day.  Always check the nutrition information of foods labeled as "low-fat" or "nonfat". These foods may be higher in added sugar or refined carbohydrates and should be avoided.  Talk to your dietitian to identify your daily goals for nutrients listed on the label. Shopping  Avoid buying canned, premade, or processed foods. These foods tend to be high in fat, sodium, and added sugar.  Shop around the outside edge of the grocery store. This includes fresh fruits and vegetables, bulk grains, fresh meats, and fresh dairy. Cooking  Use low-heat cooking methods, such as baking, instead of high-heat cooking methods like deep frying.  Cook using healthy oils, such as olive, canola, or sunflower oil.  Avoid cooking with butter, cream, or high-fat meats. Meal planning  Eat meals and snacks regularly, preferably at the same times every day. Avoid going long periods of time without eating.  Eat foods high in fiber, such as fresh fruits, vegetables, beans, and whole grains. Talk to your dietitian about how many servings of carbohydrates you can eat at each meal.  Eat 4-6 ounces of lean protein each day, such as lean meat, chicken, fish, eggs, or tofu. 1 ounce is equal to 1 ounce of meat, chicken, or fish, 1 egg, or 1/4 cup of tofu.  Eat some foods each day that contain healthy fats, such as avocado, nuts, seeds, and fish. Lifestyle   Check your blood glucose regularly.  Exercise at least  30 minutes 5 or more days each week, or as told by your health care provider.  Take medicines as told by your health care provider.  Do not use any products that contain nicotine or tobacco, such as cigarettes and e-cigarettes. If you need help quitting, ask your health care provider.  Work with a Social worker or diabetes educator to identify strategies to manage stress and any emotional and social challenges. What are some questions to ask my health care provider?  Do I need to meet with a diabetes educator?  Do I need to meet with a dietitian?  What number can I call if I have questions?  When are the best times to check my blood glucose? Where to find more information:  American Diabetes Association: diabetes.org/food-and-fitness/food  Academy of Nutrition and Dietetics: PokerClues.dk  [employer] of Diabetes and Digestive and Kidney Diseases (NIH): ContactWire.be Summary  A healthy meal plan will help you control your blood glucose and maintain a healthy lifestyle.  Working with a diet and nutrition specialist (dietitian) can help you make a meal plan that is best for you.  Keep in mind that carbohydrates  and alcohol have immediate effects on your blood glucose levels. It is important to count carbohydrates and to use alcohol carefully. This information is not intended to replace advice given to you by your health care provider. Make sure you discuss any questions you have with your health care provider. Document Released: 06/16/2005 Document Revised: 10/24/2016 Document Reviewed: 10/24/2016 Elsevier Interactive Patient Education  [employer].

## 2017-10-27 NOTE — Assessment & Plan Note (Signed)
Likely uncontrolled given 3+ glucose and urine, vaginal yeast infection, poor diet.  Repeat A1c pending today.  Long discussion regarding her diet and changes that need to be made.  Referral placed to diabetes nutrition for further guidance.  Restart statin.  Continue losartan.  Pneumonia vaccination up-to-date.  Foot exam unremarkable today.  Follow-up in 3 months for reevaluation.  Consider insulin if needed, discontinue Jardiance and also glimepiride if insulin indicated.

## 2017-10-28 LAB — WET PREP BY MOLECULAR PROBE
Candida species: NOT DETECTED
Gardnerella vaginalis: NOT DETECTED
MICRO NUMBER: 90109052
SPECIMEN QUALITY:: ADEQUATE
Trichomonas vaginosis: NOT DETECTED

## 2017-10-30 ENCOUNTER — Other Ambulatory Visit: Payer: Self-pay | Admitting: Primary Care

## 2017-10-30 ENCOUNTER — Ambulatory Visit: Payer: Self-pay | Admitting: Primary Care

## 2017-10-30 ENCOUNTER — Other Ambulatory Visit: Payer: Self-pay | Admitting: *Deleted

## 2017-10-30 ENCOUNTER — Other Ambulatory Visit (INDEPENDENT_AMBULATORY_CARE_PROVIDER_SITE_OTHER): Payer: Medicare PPO

## 2017-10-30 DIAGNOSIS — K219 Gastro-esophageal reflux disease without esophagitis: Secondary | ICD-10-CM

## 2017-10-30 DIAGNOSIS — E119 Type 2 diabetes mellitus without complications: Secondary | ICD-10-CM

## 2017-10-30 DIAGNOSIS — E559 Vitamin D deficiency, unspecified: Secondary | ICD-10-CM

## 2017-10-30 DIAGNOSIS — E039 Hypothyroidism, unspecified: Secondary | ICD-10-CM | POA: Diagnosis not present

## 2017-10-30 DIAGNOSIS — I10 Essential (primary) hypertension: Secondary | ICD-10-CM

## 2017-10-30 DIAGNOSIS — E785 Hyperlipidemia, unspecified: Secondary | ICD-10-CM | POA: Diagnosis not present

## 2017-10-30 LAB — LIPID PANEL
CHOL/HDL RATIO: 4
CHOLESTEROL: 201 mg/dL — AB (ref 0–200)
HDL: 50.2 mg/dL (ref >39.00)
NonHDL: 150.63
Triglycerides: 221 mg/dL — ABNORMAL HIGH (ref 0.0–149.0)
VLDL: 44.2 mg/dL — ABNORMAL HIGH (ref 0.0–40.0)

## 2017-10-30 LAB — COMPREHENSIVE METABOLIC PANEL
ALBUMIN: 3.9 g/dL (ref 3.5–5.2)
ALT: 27 U/L (ref 0–35)
AST: 20 U/L (ref 0–37)
Alkaline Phosphatase: 85 U/L (ref 39–117)
BILIRUBIN TOTAL: 0.3 mg/dL (ref 0.2–1.2)
BUN: 23 mg/dL (ref 6–23)
CALCIUM: 9.6 mg/dL (ref 8.4–10.5)
CO2: 23 meq/L (ref 19–32)
CREATININE: 1.12 mg/dL (ref 0.40–1.20)
Chloride: 105 mEq/L (ref 96–112)
GFR: 52.3 mL/min — ABNORMAL LOW (ref >60.00)
Glucose, Bld: 172 mg/dL — ABNORMAL HIGH (ref 70–99)
Potassium: 4 mEq/L (ref 3.5–5.1)
Sodium: 138 mEq/L (ref 135–145)
Total Protein: 7.6 g/dL (ref 6.0–8.3)

## 2017-10-30 LAB — TSH: TSH: 4.33 u[IU]/mL (ref 0.35–4.50)

## 2017-10-30 LAB — VITAMIN D 25 HYDROXY (VIT D DEFICIENCY, FRACTURES): VITD: 37.22 ng/mL (ref 30.00–100.00)

## 2017-10-30 LAB — LDL CHOLESTEROL, DIRECT: Direct LDL: 130 mg/dL

## 2017-10-30 LAB — HEMOGLOBIN A1C: Hgb A1c MFr Bld: 9 % — ABNORMAL HIGH (ref 4.6–6.5)

## 2017-10-30 MED ORDER — INSULIN PEN NEEDLE 32G X 6 MM MISC: 3 refills | Status: DC

## 2017-10-30 MED ORDER — INSULIN GLARGINE 100 UNIT/ML SOLOSTAR PEN: 10.0000 [IU] | PEN_INJECTOR | Freq: Every evening | SUBCUTANEOUS | 3 refills | Status: DC

## 2017-10-31 MED ORDER — PANTOPRAZOLE SODIUM 20 MG PO TBEC: 20.0000 mg | DELAYED_RELEASE_TABLET | Freq: Every day | ORAL | 1 refills | Status: DC

## 2017-12-04 ENCOUNTER — Telehealth: Payer: Self-pay | Admitting: Primary Care

## 2017-12-04 DIAGNOSIS — E119 Type 2 diabetes mellitus without complications: Secondary | ICD-10-CM

## 2017-12-04 MED ORDER — GLIMEPIRIDE 4 MG PO TABS: 4.0000 mg | ORAL_TABLET | Freq: Two times a day (BID) | ORAL | 0 refills | Status: DC

## 2017-12-04 NOTE — Telephone Encounter (Signed)
Copied from [place]. Topic: Quick Communication - See Telephone Encounter >> Dec 04, 2017  9:29 AM Conception Chancy, NT wrote: CRM for notification. See Telephone encounter for:  12/04/17.  Patient is calling and requesting a refill on glimepiride (AMARYL) 4 MG tablet. She states she is almost completley out and her pharmacy told her she did not have any refills. Please advise.   [facility], [facility]  [address] [place] [address]  Phone: [phone] Fax: [phone]

## 2017-12-08 ENCOUNTER — Other Ambulatory Visit: Payer: Self-pay | Admitting: Primary Care

## 2017-12-09 ENCOUNTER — Other Ambulatory Visit: Payer: Self-pay | Admitting: Primary Care

## 2017-12-09 DIAGNOSIS — E119 Type 2 diabetes mellitus without complications: Secondary | ICD-10-CM

## 2018-01-20 ENCOUNTER — Other Ambulatory Visit: Payer: Self-pay | Admitting: Primary Care

## 2018-01-20 DIAGNOSIS — E785 Hyperlipidemia, unspecified: Secondary | ICD-10-CM

## 2018-01-20 DIAGNOSIS — E034 Atrophy of thyroid (acquired): Secondary | ICD-10-CM

## 2018-01-20 DIAGNOSIS — Z794 Long term (current) use of insulin: Principal | ICD-10-CM

## 2018-01-20 DIAGNOSIS — E118 Type 2 diabetes mellitus with unspecified complications: Secondary | ICD-10-CM

## 2018-01-22 ENCOUNTER — Other Ambulatory Visit: Payer: Medicare HMO

## 2018-01-23 ENCOUNTER — Ambulatory Visit (INDEPENDENT_AMBULATORY_CARE_PROVIDER_SITE_OTHER)
Admission: RE | Admit: 2018-01-23 | Discharge: 2018-01-23 | Disposition: A | Discharge status: Home / Self Care | Payer: Medicare HMO | Source: Ambulatory Visit | Attending: Primary Care | Admitting: Primary Care

## 2018-01-23 ENCOUNTER — Ambulatory Visit (INDEPENDENT_AMBULATORY_CARE_PROVIDER_SITE_OTHER): Payer: Medicare HMO | Admitting: Primary Care

## 2018-01-23 ENCOUNTER — Encounter: Payer: Self-pay | Admitting: Primary Care

## 2018-01-23 VITALS — BP 140/76 | HR 91 | Temp 98.2°F | Ht 67.0 in | Wt 280.0 lb

## 2018-01-23 DIAGNOSIS — R059 Cough, unspecified: Secondary | ICD-10-CM

## 2018-01-23 DIAGNOSIS — R55 Syncope and collapse: Secondary | ICD-10-CM | POA: Diagnosis not present

## 2018-01-23 DIAGNOSIS — R05 Cough: Secondary | ICD-10-CM

## 2018-01-23 LAB — CBC WITH DIFFERENTIAL/PLATELET
BASOS ABS: 0.1 10*3/uL (ref 0.0–0.1)
Basophils Relative: 1.4 % (ref 0.0–3.0)
EOS ABS: 0.4 10*3/uL (ref 0.0–0.7)
Eosinophils Relative: 3.5 % (ref 0.0–5.0)
HCT: 31.4 % — ABNORMAL LOW (ref 36.0–46.0)
Hemoglobin: 9.7 g/dL — ABNORMAL LOW (ref 12.0–15.0)
LYMPHS ABS: 2.1 10*3/uL (ref 0.7–4.0)
Lymphocytes Relative: 20.7 % (ref 12.0–46.0)
MCHC: 30.9 g/dL (ref 30.0–36.0)
MCV: 77.8 fl — ABNORMAL LOW (ref 78.0–100.0)
Monocytes Absolute: 0.7 10*3/uL (ref 0.1–1.0)
Monocytes Relative: 6.3 % (ref 3.0–12.0)
NEUTROS ABS: 7 10*3/uL (ref 1.4–7.7)
NEUTROS PCT: 68.1 % (ref 43.0–77.0)
PLATELETS: 350 10*3/uL (ref 150.0–400.0)
RBC: 4.03 Mil/uL (ref 3.87–5.11)
RDW: 17.7 % — ABNORMAL HIGH (ref 11.5–15.5)
WBC: 10.3 10*3/uL (ref 4.0–10.5)

## 2018-01-23 LAB — COMPREHENSIVE METABOLIC PANEL
ALT: 26 U/L (ref 0–35)
AST: 23 U/L (ref 0–37)
Albumin: 3.8 g/dL (ref 3.5–5.2)
Alkaline Phosphatase: 93 U/L (ref 39–117)
BILIRUBIN TOTAL: 0.2 mg/dL (ref 0.2–1.2)
BUN: 26 mg/dL — ABNORMAL HIGH (ref 6–23)
CO2: 26 meq/L (ref 19–32)
Calcium: 9.7 mg/dL (ref 8.4–10.5)
Chloride: 101 mEq/L (ref 96–112)
Creatinine, Ser: 1.13 mg/dL (ref 0.40–1.20)
GFR: 51.72 mL/min — ABNORMAL LOW (ref >60.00)
GLUCOSE: 224 mg/dL — AB (ref 70–99)
Potassium: 4.3 mEq/L (ref 3.5–5.1)
Sodium: 134 mEq/L — ABNORMAL LOW (ref 135–145)
Total Protein: 7.5 g/dL (ref 6.0–8.3)

## 2018-01-23 LAB — TROPONIN I: TNIDX: 0.01 ug/l (ref 0.00–0.06)

## 2018-01-23 NOTE — Patient Instructions (Signed)
Complete xray(s) and labs prior to leaving today. I will notify you of your results once received.  Ensure you are consuming 64 ounces of water daily.  Please schedule a diabetes follow up appointment as discussed.  Please go to the hospital if symptoms return.  It was a pleasure to see you today!

## 2018-01-23 NOTE — Progress Notes (Signed)
Subjective:    Patient ID: Danielle Edwards, female    DOB: 1956-02-07, 62 y.o.   MRN: [id number]  HPI  Danielle Edwards is a 62 year old female with a history of GERD, hypertension, type 2 diabetes, recurrent sinusitis who presents today with a chief complaint of cough.  She also reports nasal congestion, sore throat, chest congestion. Her symptoms began 3 days ago. She was exposed to her grandson who had similar symptoms who was treated with Amoxil for an ear infection. She's been taking Dayquil.  She went out to [employer] yesterday, started feeling "hot and faint" while in the check out line. She felt that she couldn't stand up and broke out in sweats with nausea. Paramedics were called who came by and did an ECG (unremarkable), checked CBG (190), and BP which was normal. She had this reaction about 7 years ago while at a hot concert, she also it may have been secondary to Tylenol. She admits to not drinking much water.   She denies chest pain, feeling hot and faint since yesterday.   Review of Systems  Constitutional: Positive for diaphoresis and fatigue.  HENT: Positive for congestion, ear pain and sore throat. Negative for sinus pressure.   Respiratory: Positive for cough. Negative for shortness of breath.   Cardiovascular: Negative for chest pain.  Neurological: Negative for headaches.       Past Medical History:  Diagnosis Date  . Anemia    Iron defiency   . Chronic pain   . Diabetes type 2, controlled ([place])   . Headache   . History of colonic polyps 1999   Benign  . Hx of adenomatous polyp of colon 09/27/2000   2001 - diminutive adenoma [place] 2007 no polyps - [place]  . Hypertension   . Hypothyroidism   . IBS (irritable bowel syndrome)    Diarrhea type  . Vitamin D deficiency      Social History   Socioeconomic History  . Marital status: Married    Spouse name: Not on file  . Number of children: Not on file  . Years of education: Not on file  . Highest education  level: Not on file  Occupational History  . Not on file  Social Needs  . Financial resource strain: Not on file  . Food insecurity:    Worry: Not on file    Inability: Not on file  . Transportation needs:    Medical: Not on file    Non-medical: Not on file  Tobacco Use  . Smoking status: Never Smoker  . Smokeless tobacco: Never Used  Substance and Sexual Activity  . Alcohol use: Yes    Alcohol/week: 0.0 oz    Comment: rarely  . Drug use: No  . Sexual activity: Not on file  Lifestyle  . Physical activity:    Days per week: Not on file    Minutes per session: Not on file  . Stress: Not on file  Relationships  . Social connections:    Talks on phone: Not on file    Gets together: Not on file    Attends religious service: Not on file    Active member of club or organization: Not on file    Attends meetings of clubs or organizations: Not on file    Relationship status: Not on file  . Intimate partner violence:    Fear of current or ex partner: Not on file    Emotionally abused: Not on file    Physically  abused: Not on file    Forced sexual activity: Not on file  Other Topics Concern  . Not on file  Social History Narrative   Married.   3 children.   Retired, worked at [employer] in [place], [place].   Enjoys being with her grandchildren, being with her friends.    Past Surgical History:  Procedure Laterality Date  . CHOLECYSTECTOMY  1995  . COLONOSCOPY  multiple  . NASAL SINUS SURGERY  2001  . TONSILLECTOMY AND ADENOIDECTOMY    . VAGINAL HYSTERECTOMY     Complete    Family History  Problem Relation Age of Onset  . Colon cancer Mother   . Lung cancer Mother   . Skin cancer Mother   . Heart disease Father   . Diabetes Father   . Skin cancer Father     No Known Allergies  Current Outpatient Medications on File Prior to Visit  Medication Sig Dispense Refill  . ACCU-CHEK FASTCLIX LANCETS MISC Use as instructed to blood sugar up to 3 times daily 300 each 1  .  amLODipine (NORVASC) 10 MG tablet Take 1 tablet (10 mg total) by mouth daily. 90 tablet 3  . atorvastatin (LIPITOR) 40 MG tablet Take 1 tablet (40 mg total) by mouth at bedtime. 90 tablet 3  . Blood Glucose Monitoring Suppl (ACCU-CHEK AVIVA PLUS) w/Device KIT Use as instructed to test blood sugar 3 times daily 1 kit 0  . fluticasone (FLONASE) 50 MCG/ACT nasal spray Place 2 sprays into both nostrils daily. 16 g 0  . glimepiride (AMARYL) 4 MG tablet Take 1 tablet (4 mg total) by mouth 2 (two) times daily. 120 tablet 0  . glucose blood (ACCU-CHEK AVIVA) test strip Use as instructed to test blood sugar up to 3 times daily 300 each 1  . hydrochlorothiazide (MICROZIDE) 12.5 MG capsule TAKE 1 CAPSULE (12.5 MG TOTAL) BY MOUTH DAILY. 90 capsule 3  . Insulin Glargine (LANTUS SOLOSTAR) 100 UNIT/ML Solostar Pen Inject 10 Units into the skin every evening. 15 mL 3  . Insulin Pen Needle 32G X 6 MM MISC Use as instructed to inject insulin daily at bedtime 100 each 3  . loperamide (IMODIUM A-D) 2 MG tablet Take 2 mg by mouth 4 (four) times daily as needed for diarrhea or loose stools.    Marland Kitchen losartan (COZAAR) 100 MG tablet Take 1 tablet (100 mg total) by mouth daily. 90 tablet 3  . metFORMIN (GLUCOPHAGE) 1000 MG tablet Take 1 tablet (1,000 mg total) by mouth 2 (two) times daily with a meal. 180 tablet 2  . oxyCODONE (OXY IR/ROXICODONE) 5 MG immediate release tablet Take 5 mg by mouth 4 (four) times daily as needed.    . pantoprazole (PROTONIX) 20 MG tablet Take 1 tablet (20 mg total) by mouth daily. 90 tablet 1  . venlafaxine XR (EFFEXOR XR) 37.5 MG 24 hr capsule Take 1 capsule (37.5 mg total) by mouth daily with breakfast. 90 capsule 3  . levothyroxine (LEVOTHROID) 25 MCG tablet Take 1 tablet (25 mcg total) by mouth daily before breakfast. (Patient not taking: Reported on 01/23/2018) 90 tablet 0  . promethazine (PHENERGAN) 25 MG tablet Take 25 mg by mouth every 6 (six) hours as needed. Reported on 09/21/2015  1   No  current facility-administered medications on file prior to visit.     BP 140/76   Pulse 91   Temp 98.2 F (36.8 C) (Oral)   Ht '5\' 7"'$  (1.702 m)   Wt 280 lb (  127 kg)   SpO2 97%   BMI 43.85 kg/m    Objective:   Physical Exam  Constitutional: She appears well-nourished. She does not appear ill.  HENT:  Right Ear: Tympanic membrane and ear canal normal.  Left Ear: Tympanic membrane and ear canal normal.  Nose: Right sinus exhibits no maxillary sinus tenderness and no frontal sinus tenderness. Left sinus exhibits no maxillary sinus tenderness and no frontal sinus tenderness.  Mouth/Throat: Oropharynx is clear and moist.  Eyes: Conjunctivae are normal.  Neck: Neck supple.  Cardiovascular: Normal rate and regular rhythm.  Pulmonary/Chest: Effort normal and breath sounds normal. She has no wheezes. She has no rales.  Lymphadenopathy:    She has no cervical adenopathy.  Skin: Skin is warm and dry.          Assessment & Plan:  URI:  Cough, congestion x 3 days with onset of diaphoresis and dizziness yesterday. ECG reviewed and shows NSR with rate of 94, no obvious ST elevation or depression, no other arhythmia.  Exam today unremarkable, clear lungs, doesn't appear ill. She is stable. Check labs today including Troponin, CBC, CMP. Chest xray pending. Reminded patient to work on hydration with water.   Danielle Koch, NP

## 2018-01-24 ENCOUNTER — Telehealth: Payer: Self-pay

## 2018-01-24 DIAGNOSIS — J069 Acute upper respiratory infection, unspecified: Secondary | ICD-10-CM

## 2018-01-24 MED ORDER — BENZONATATE 200 MG PO CAPS: 200.0000 mg | ORAL_CAPSULE | Freq: Three times a day (TID) | ORAL | 0 refills | Status: DC | PRN

## 2018-01-24 NOTE — Telephone Encounter (Signed)
Pt seen 01/23/18.Please advise.

## 2018-01-24 NOTE — Telephone Encounter (Signed)
Spoken and notified patient of Danielle Edwards comments. Patient verbalized understanding.  I have change the appointment to 9:30 so it will be 30 mintues for you.

## 2018-01-24 NOTE — Telephone Encounter (Signed)
Noted  

## 2018-01-24 NOTE — Telephone Encounter (Signed)
Please notify patient:  1. I'll send in the [facility] for cough, take 1 capsule by mouth every 8 hours as needed. 2. Needs to start Ferrous Sulfate 325 mg tablets once daily on an empty stomach (if possible) every morning. We will discuss her anemia in greater detail during her diabetes follow up. 3. Does not need ENT. Sinus infections are often caused from viruses as discussed. Her symptoms may get worse before they get better, but they should be better by early next week. She can try sinus rinses (Neti Pot), Flonase, antihistamine (Zyrtec).  4. Can we change her appointment on 05/07 to a 30 minute visit?

## 2018-01-24 NOTE — Telephone Encounter (Signed)
Copied from [place] [id number]. Topic: General - Other >> Jan 24, 2018  2:25 PM Yvette Rack wrote: Reason for CRM: patient calling stating that she still has a cough and would like Tesslon pearls and a cough medicine pt also states that she use to take iron medicine Intergra and want to know if its over the counter or need a RX for it  pt also states that she has a sinus infection and want to know if she need an antibiotic or go to an ENT she states that she isn't feeling any better still have thick discharge pt would like a call back about her concerns

## 2018-01-27 ENCOUNTER — Other Ambulatory Visit: Payer: Self-pay | Admitting: Primary Care

## 2018-01-29 DIAGNOSIS — J019 Acute sinusitis, unspecified: Secondary | ICD-10-CM | POA: Diagnosis not present

## 2018-01-29 DIAGNOSIS — J029 Acute pharyngitis, unspecified: Secondary | ICD-10-CM | POA: Diagnosis not present

## 2018-02-01 ENCOUNTER — Other Ambulatory Visit: Payer: Self-pay | Admitting: Primary Care

## 2018-02-01 DIAGNOSIS — K219 Gastro-esophageal reflux disease without esophagitis: Secondary | ICD-10-CM

## 2018-02-05 ENCOUNTER — Other Ambulatory Visit (INDEPENDENT_AMBULATORY_CARE_PROVIDER_SITE_OTHER): Payer: Medicare HMO

## 2018-02-05 DIAGNOSIS — E118 Type 2 diabetes mellitus with unspecified complications: Secondary | ICD-10-CM | POA: Diagnosis not present

## 2018-02-05 DIAGNOSIS — E034 Atrophy of thyroid (acquired): Secondary | ICD-10-CM | POA: Diagnosis not present

## 2018-02-05 DIAGNOSIS — E785 Hyperlipidemia, unspecified: Secondary | ICD-10-CM

## 2018-02-05 DIAGNOSIS — Z794 Long term (current) use of insulin: Secondary | ICD-10-CM

## 2018-02-05 LAB — HEMOGLOBIN A1C: HEMOGLOBIN A1C: 8.8 % — AB (ref 4.6–6.5)

## 2018-02-05 LAB — LIPID PANEL
Cholesterol: 176 mg/dL (ref 0–200)
HDL: 41.9 mg/dL (ref >39.00)
NONHDL: 134.26
TRIGLYCERIDES: 239 mg/dL — AB (ref 0.0–149.0)
Total CHOL/HDL Ratio: 4
VLDL: 47.8 mg/dL — ABNORMAL HIGH (ref 0.0–40.0)

## 2018-02-05 LAB — LDL CHOLESTEROL, DIRECT: LDL DIRECT: 112 mg/dL

## 2018-02-05 LAB — TSH: TSH: 4.38 u[IU]/mL (ref 0.35–4.50)

## 2018-02-06 ENCOUNTER — Ambulatory Visit (INDEPENDENT_AMBULATORY_CARE_PROVIDER_SITE_OTHER): Payer: Medicare HMO | Admitting: Primary Care

## 2018-02-06 ENCOUNTER — Encounter: Payer: Self-pay | Admitting: Primary Care

## 2018-02-06 VITALS — BP 136/76 | HR 78 | Temp 98.1°F | Ht 67.0 in | Wt 286.5 lb

## 2018-02-06 DIAGNOSIS — E039 Hypothyroidism, unspecified: Secondary | ICD-10-CM

## 2018-02-06 DIAGNOSIS — D649 Anemia, unspecified: Secondary | ICD-10-CM | POA: Diagnosis not present

## 2018-02-06 DIAGNOSIS — Z23 Encounter for immunization: Secondary | ICD-10-CM

## 2018-02-06 DIAGNOSIS — E119 Type 2 diabetes mellitus without complications: Secondary | ICD-10-CM | POA: Diagnosis not present

## 2018-02-06 DIAGNOSIS — E785 Hyperlipidemia, unspecified: Secondary | ICD-10-CM

## 2018-02-06 MED ORDER — ZOSTER VAC RECOMB ADJUVANTED 50 MCG/0.5ML IM SUSR: 0.5000 mL | Freq: Once | INTRAMUSCULAR | 1 refills | Status: AC

## 2018-02-06 MED ORDER — INSULIN GLARGINE 100 UNIT/ML SOLOSTAR PEN: 14.0000 [IU] | PEN_INJECTOR | Freq: Every evening | SUBCUTANEOUS | 3 refills | Status: DC

## 2018-02-06 NOTE — Patient Instructions (Addendum)
We've increased your Lantus to 14 units. Inject this every evening at bedtime.   Continue to monitor your blood sugars, please call me if you get consistent readings below 80 or above 200.  Start exercising. You should be getting 150 minutes of moderate intensity exercise weekly.  Work on [employer]. Limit milk, soda, candy, sweets, fast food, restaurant food.  Take the Shingles vaccination to your pharmacy for administration.  Schedule your eye exam as discussed.  Please schedule a follow up appointment in 3 months for diabetes check. Please bring your glucose logs.  It was a pleasure to see you today!   Diabetes Mellitus and Nutrition When you have diabetes (diabetes mellitus), it is very important to have healthy eating habits because your blood sugar (glucose) levels are greatly affected by what you eat and drink. Eating healthy foods in the appropriate amounts, at about the same times every day, can help you:  Control your blood glucose.  Lower your risk of heart disease.  Improve your blood pressure.  Reach or maintain a healthy weight.  Every person with diabetes is different, and each person has different needs for a meal plan. Your health care provider may recommend that you work with a diet and nutrition specialist (dietitian) to make a meal plan that is best for you. Your meal plan may vary depending on factors such as:  The calories you need.  The medicines you take.  Your weight.  Your blood glucose, blood pressure, and cholesterol levels.  Your activity level.  Other health conditions you have, such as heart or kidney disease.  How do carbohydrates affect me? Carbohydrates affect your blood glucose level more than any other type of food. Eating carbohydrates naturally increases the amount of glucose in your blood. Carbohydrate counting is a method for keeping track of how many carbohydrates you eat. Counting carbohydrates is important to keep your  blood glucose at a healthy level, especially if you use insulin or take certain oral diabetes medicines. It is important to know how many carbohydrates you can safely have in each meal. This is different for every person. Your dietitian can help you calculate how many carbohydrates you should have at each meal and for snack. Foods that contain carbohydrates include:  Bread, cereal, rice, pasta, and crackers.  Potatoes and corn.  Peas, beans, and lentils.  Milk and yogurt.  Fruit and juice.  Desserts, such as cakes, cookies, ice cream, and candy.  How does alcohol affect me? Alcohol can cause a sudden decrease in blood glucose (hypoglycemia), especially if you use insulin or take certain oral diabetes medicines. Hypoglycemia can be a life-threatening condition. Symptoms of hypoglycemia (sleepiness, dizziness, and confusion) are similar to symptoms of having too much alcohol. If your health care provider says that alcohol is safe for you, follow these guidelines:  Limit alcohol intake to no more than 1 drink per day for nonpregnant women and 2 drinks per day for men. One drink equals 12 oz of beer, 5 oz of wine, or 1 oz of hard liquor.  Do not drink on an empty stomach.  Keep yourself hydrated with water, diet soda, or unsweetened iced tea.  Keep in mind that regular soda, juice, and other mixers may contain a lot of sugar and must be counted as carbohydrates.  What are tips for following this plan? Reading food labels  Start by checking the serving size on the label. The amount of calories, carbohydrates, fats, and other nutrients listed on  the label are based on one serving of the food. Many foods contain more than one serving per package.  Check the total grams (g) of carbohydrates in one serving. You can calculate the number of servings of carbohydrates in one serving by dividing the total carbohydrates by 15. For example, if a food has 30 g of total carbohydrates, it would be  equal to 2 servings of carbohydrates.  Check the number of grams (g) of saturated and trans fats in one serving. Choose foods that have low or no amount of these fats.  Check the number of milligrams (mg) of sodium in one serving. Most people should limit total sodium intake to less than 2,300 mg per day.  Always check the nutrition information of foods labeled as "low-fat" or "nonfat". These foods may be higher in added sugar or refined carbohydrates and should be avoided.  Talk to your dietitian to identify your daily goals for nutrients listed on the label. Shopping  Avoid buying canned, premade, or processed foods. These foods tend to be high in fat, sodium, and added sugar.  Shop around the outside edge of the grocery store. This includes fresh fruits and vegetables, bulk grains, fresh meats, and fresh dairy. Cooking  Use low-heat cooking methods, such as baking, instead of high-heat cooking methods like deep frying.  Cook using healthy oils, such as olive, canola, or sunflower oil.  Avoid cooking with butter, cream, or high-fat meats. Meal planning  Eat meals and snacks regularly, preferably at the same times every day. Avoid going long periods of time without eating.  Eat foods high in fiber, such as fresh fruits, vegetables, beans, and whole grains. Talk to your dietitian about how many servings of carbohydrates you can eat at each meal.  Eat 4-6 ounces of lean protein each day, such as lean meat, chicken, fish, eggs, or tofu. 1 ounce is equal to 1 ounce of meat, chicken, or fish, 1 egg, or 1/4 cup of tofu.  Eat some foods each day that contain healthy fats, such as avocado, nuts, seeds, and fish. Lifestyle   Check your blood glucose regularly.  Exercise at least 30 minutes 5 or more days each week, or as told by your health care provider.  Take medicines as told by your health care provider.  Do not use any products that contain nicotine or tobacco, such as cigarettes  and e-cigarettes. If you need help quitting, ask your health care provider.  Work with a Social worker or diabetes educator to identify strategies to manage stress and any emotional and social challenges. What are some questions to ask my health care provider?  Do I need to meet with a diabetes educator?  Do I need to meet with a dietitian?  What number can I call if I have questions?  When are the best times to check my blood glucose? Where to find more information:  American Diabetes Association: diabetes.org/food-and-fitness/food  Academy of Nutrition and Dietetics: PokerClues.dk  [employer] of Diabetes and Digestive and Kidney Diseases (NIH): ContactWire.be Summary  A healthy meal plan will help you control your blood glucose and maintain a healthy lifestyle.  Working with a diet and nutrition specialist (dietitian) can help you make a meal plan that is best for you.  Keep in mind that carbohydrates and alcohol have immediate effects on your blood glucose levels. It is important to count carbohydrates and to use alcohol carefully. This information is not intended to replace advice given to you by your health  care provider. Make sure you discuss any questions you have with your health care provider. Document Released: 06/16/2005 Document Revised: 10/24/2016 Document Reviewed: 10/24/2016 Elsevier Interactive Patient Education  [employer].

## 2018-02-06 NOTE — Assessment & Plan Note (Signed)
Recent hemoglobin of 9.7. Discussed to continue oral iron at 325 mg once daily. Repeat CBC at next visit.

## 2018-02-06 NOTE — Assessment & Plan Note (Signed)
TSH stable. Continue to monitor.

## 2018-02-06 NOTE — Assessment & Plan Note (Signed)
Recent LDL above goal, is not taking atorvastatin as prescribed. Explained the importance of compliance to atorvastatin, she verbalized understanding.  Repeat lipids next visit.

## 2018-02-06 NOTE — Progress Notes (Signed)
Subjective:    Patient ID: Danielle Edwards, female    DOB: December 28, 1955, 62 y.o.   MRN: [id number]  HPI  Danielle Edwards is a 62 year old female who presents today for follow up. She is compliant to her 325 mg of Ferrous Sulfate and is taking once daily. She is requesting a refill of her promethazine for which she uses for intermittent nausea. She'll experience nausea 1-2 times weekly.   1) Type 2 Diabetes:   Current medications include: Glimepiride 4 mg BID, Lantus 10 units HS, Metformin 1000 mg BID.  She is checking her blood glucose 1-4 times daily and is getting readings of: AM Fasting: 120's-160's, 176 2 hours after meals: mid 100's Bedtime: low to mid 100's.   Last A1C: 8.8 yesterday, 9.0 in January 2019 Last Eye Exam: Due, she will schedule an appointment Last Foot Exam: Due in January 2020 Pneumonia Vaccination: Completed in 2016 ACE/ARB: Losartan Statin: Atorvastatin   Diet currently consists of:  Breakfast: Skips Lunch: Sandwich Dinner: Sandwich, soup, salad, restaurants, fast food Snacks: Chips, cottage cheese, nuts, cookies Desserts: Daily Beverages: Occasional soda, water, milk  Exercise: She is not exercising.  2) Hypothyroidism: Recent TSH of 4.38. Last dose of levothyroxine was 6 months ago. Family history of Grave's Disease.   3) Hyperlipidemia: Currently managed on atorvastatin 40 mg. LDL of 112 yesterday. She forgets to take her atorvastatin often, she's not had her atorvastatin in over one week.   BP Readings from Last 3 Encounters:  02/06/18 136/76  01/23/18 140/76  10/27/17 (!) 142/78       Review of Systems  Constitutional: Negative for fatigue.  Eyes: Negative for visual disturbance.  Respiratory: Negative for shortness of breath.   Cardiovascular: Negative for chest pain.  Neurological: Negative for dizziness, numbness and headaches.       Past Medical History:  Diagnosis Date  . Anemia    Iron defiency   . Chronic pain   . Diabetes type  2, controlled ([place])   . Headache   . History of colonic polyps 1999   Benign  . Hx of adenomatous polyp of colon 09/27/2000   2001 - diminutive adenoma [place] 2007 no polyps - [place]  . Hypertension   . Hypothyroidism   . IBS (irritable bowel syndrome)    Diarrhea type  . Vitamin D deficiency      Social History   Socioeconomic History  . Marital status: Married    Spouse name: Not on file  . Number of children: Not on file  . Years of education: Not on file  . Highest education level: Not on file  Occupational History  . Not on file  Social Needs  . Financial resource strain: Not on file  . Food insecurity:    Worry: Not on file    Inability: Not on file  . Transportation needs:    Medical: Not on file    Non-medical: Not on file  Tobacco Use  . Smoking status: Never Smoker  . Smokeless tobacco: Never Used  Substance and Sexual Activity  . Alcohol use: Yes    Alcohol/week: 0.0 oz    Comment: rarely  . Drug use: No  . Sexual activity: Not on file  Lifestyle  . Physical activity:    Days per week: Not on file    Minutes per session: Not on file  . Stress: Not on file  Relationships  . Social connections:    Talks on phone: Not on file  Gets together: Not on file    Attends religious service: Not on file    Active member of club or organization: Not on file    Attends meetings of clubs or organizations: Not on file    Relationship status: Not on file  . Intimate partner violence:    Fear of current or ex partner: Not on file    Emotionally abused: Not on file    Physically abused: Not on file    Forced sexual activity: Not on file  Other Topics Concern  . Not on file  Social History Narrative   Married.   3 children.   Retired, worked at [employer] in [place], [place].   Enjoys being with her grandchildren, being with her friends.    Past Surgical History:  Procedure Laterality Date  . CHOLECYSTECTOMY  1995  . COLONOSCOPY  multiple  . NASAL SINUS  SURGERY  2001  . TONSILLECTOMY AND ADENOIDECTOMY    . VAGINAL HYSTERECTOMY     Complete    Family History  Problem Relation Age of Onset  . Colon cancer Mother   . Lung cancer Mother   . Skin cancer Mother   . Heart disease Father   . Diabetes Father   . Skin cancer Father     No Known Allergies  Current Outpatient Medications on File Prior to Visit  Medication Sig Dispense Refill  . ACCU-CHEK FASTCLIX LANCETS MISC Use as instructed to blood sugar up to 3 times daily 300 each 1  . amLODipine (NORVASC) 10 MG tablet Take 1 tablet (10 mg total) by mouth daily. 90 tablet 3  . atorvastatin (LIPITOR) 40 MG tablet Take 1 tablet (40 mg total) by mouth at bedtime. 90 tablet 3  . benzonatate (TESSALON) 200 MG capsule Take 1 capsule (200 mg total) by mouth 3 (three) times daily as needed for cough. 30 capsule 0  . Blood Glucose Monitoring Suppl (ACCU-CHEK AVIVA PLUS) w/Device KIT Use as instructed to test blood sugar 3 times daily 1 kit 0  . fluticasone (FLONASE) 50 MCG/ACT nasal spray Place 2 sprays into both nostrils daily. 16 g 0  . glimepiride (AMARYL) 4 MG tablet Take 1 tablet (4 mg total) by mouth 2 (two) times daily. 120 tablet 0  . glucose blood (ACCU-CHEK AVIVA) test strip Use as instructed to test blood sugar up to 3 times daily 300 each 1  . hydrochlorothiazide (MICROZIDE) 12.5 MG capsule TAKE 1 CAPSULE (12.5 MG TOTAL) BY MOUTH DAILY. 90 capsule 3  . Insulin Pen Needle 32G X 6 MM MISC Use as instructed to inject insulin daily at bedtime 100 each 3  . levothyroxine (LEVOTHROID) 25 MCG tablet Take 1 tablet (25 mcg total) by mouth daily before breakfast. 90 tablet 0  . loperamide (IMODIUM A-D) 2 MG tablet Take 2 mg by mouth 4 (four) times daily as needed for diarrhea or loose stools.    Marland Kitchen losartan (COZAAR) 100 MG tablet Take 1 tablet (100 mg total) by mouth daily. 90 tablet 3  . metFORMIN (GLUCOPHAGE) 1000 MG tablet Take 1 tablet (1,000 mg total) by mouth 2 (two) times daily with a  meal. 180 tablet 2  . oxyCODONE (OXY IR/ROXICODONE) 5 MG immediate release tablet Take 5 mg by mouth 4 (four) times daily as needed.    . pantoprazole (PROTONIX) 20 MG tablet TAKE ONE TABLET BY MOUTH DAILY 90 tablet 0  . promethazine (PHENERGAN) 25 MG tablet Take 25 mg by mouth every 6 (six) hours as needed.  Reported on 09/21/2015  1  . venlafaxine XR (EFFEXOR XR) 37.5 MG 24 hr capsule Take 1 capsule (37.5 mg total) by mouth daily with breakfast. 90 capsule 3   No current facility-administered medications on file prior to visit.     BP 136/76   Pulse 78   Temp 98.1 F (36.7 C) (Oral)   Ht 5' 7"  (1.702 m)   Wt 286 lb 8 oz (130 kg)   SpO2 98%   BMI 44.87 kg/m    Objective:   Physical Exam  Constitutional: She appears well-nourished.  Neck: Neck supple.  Cardiovascular: Normal rate and regular rhythm.  Pulmonary/Chest: Effort normal and breath sounds normal.  Skin: Skin is warm and dry.          Assessment & Plan:

## 2018-02-06 NOTE — Assessment & Plan Note (Signed)
A1C of 8.8 today which is a slight improvement from 9.0. Increase Lantus to 14 units. Continue Metformin and Glimepiride.   Foot exam and pneumonia vaccination UTD. She will schedule eye exam. Managed on ACE and statin.  Follow up in 3 months.

## 2018-02-14 DIAGNOSIS — M7062 Trochanteric bursitis, left hip: Secondary | ICD-10-CM | POA: Diagnosis not present

## 2018-02-14 DIAGNOSIS — M7061 Trochanteric bursitis, right hip: Secondary | ICD-10-CM | POA: Diagnosis not present

## 2018-02-14 DIAGNOSIS — M797 Fibromyalgia: Secondary | ICD-10-CM | POA: Diagnosis not present

## 2018-02-14 DIAGNOSIS — M461 Sacroiliitis, not elsewhere classified: Secondary | ICD-10-CM | POA: Diagnosis not present

## 2018-02-19 ENCOUNTER — Other Ambulatory Visit: Payer: Self-pay | Admitting: Primary Care

## 2018-02-19 DIAGNOSIS — E119 Type 2 diabetes mellitus without complications: Secondary | ICD-10-CM

## 2018-03-15 ENCOUNTER — Telehealth: Payer: Self-pay | Admitting: Primary Care

## 2018-03-15 NOTE — Telephone Encounter (Signed)
Copied from [place] [phone]. Topic: Quick Communication - Rx Refill/Question >> Mar 15, 2018  1:44 PM Oliver Pila B wrote: Medication: amLODipine (NORVASC) 10 MG tablet [[id number]]  glimepiride (AMARYL) 4 MG tablet [[id number]]  Accu check aviva plus meter w/ strips/lancets/swab solution BD ultra fine pen needles losartan (COZAAR) 100 MG tablet [[id number]]  metFORMIN (GLUCOPHAGE) 1000 MG tablet [[id number]]  pantoprazole (PROTONIX) 20 MG tablet [[id number]  Has the patient contacted their pharmacy? Yes.   (Agent: If no, request that the patient contact the pharmacy for the refill.) (Agent: If yes, when and what did the pharmacy advise?)  Preferred Pharmacy (with phone number or street name): [facility]: Please be advised that RX refills may take up to 3 business days. We ask that you follow-up with your pharmacy.

## 2018-03-16 ENCOUNTER — Other Ambulatory Visit: Payer: Self-pay | Admitting: *Deleted

## 2018-03-16 DIAGNOSIS — I10 Essential (primary) hypertension: Secondary | ICD-10-CM

## 2018-03-16 DIAGNOSIS — E119 Type 2 diabetes mellitus without complications: Secondary | ICD-10-CM

## 2018-03-16 DIAGNOSIS — K219 Gastro-esophageal reflux disease without esophagitis: Secondary | ICD-10-CM

## 2018-03-16 MED ORDER — INSULIN PEN NEEDLE 32G X 6 MM MISC: 3 refills | Status: DC

## 2018-03-16 MED ORDER — ACCU-CHEK AVIVA PLUS W/DEVICE KIT: PACK | 0 refills | Status: DC

## 2018-03-16 MED ORDER — GLUCOSE BLOOD VI STRP: ORAL_STRIP | 1 refills | Status: DC

## 2018-03-16 MED ORDER — GLIMEPIRIDE 4 MG PO TABS: 4.0000 mg | ORAL_TABLET | Freq: Two times a day (BID) | ORAL | 1 refills | Status: DC

## 2018-03-16 MED ORDER — LOSARTAN POTASSIUM 100 MG PO TABS: 100.0000 mg | ORAL_TABLET | Freq: Every day | ORAL | 1 refills | Status: DC

## 2018-03-16 MED ORDER — AMLODIPINE BESYLATE 10 MG PO TABS: 10.0000 mg | ORAL_TABLET | Freq: Every day | ORAL | 1 refills | Status: DC

## 2018-03-16 MED ORDER — PANTOPRAZOLE SODIUM 20 MG PO TBEC: 20.0000 mg | DELAYED_RELEASE_TABLET | Freq: Every day | ORAL | 0 refills | Status: DC

## 2018-03-16 MED ORDER — METFORMIN HCL 1000 MG PO TABS: 1000.0000 mg | ORAL_TABLET | Freq: Two times a day (BID) | ORAL | 2 refills | Status: DC

## 2018-03-16 MED ORDER — ACCU-CHEK FASTCLIX LANCETS MISC: 1 refills | Status: DC

## 2018-03-16 NOTE — Telephone Encounter (Signed)
Rx forwarded to new pharmacy- BP medications and GERD medications refilled 6 month and 1 month accordingly.  BP medications last addressed 10/27/17 and GERD medications RF per PC p OV 10/30/17. Next appointment  05/07/18. All glucose medication sent as filled 02/06/18- patient current.

## 2018-05-07 ENCOUNTER — Ambulatory Visit: Payer: Self-pay | Admitting: Primary Care

## 2018-05-09 DIAGNOSIS — M797 Fibromyalgia: Secondary | ICD-10-CM | POA: Diagnosis not present

## 2018-05-09 DIAGNOSIS — M7061 Trochanteric bursitis, right hip: Secondary | ICD-10-CM | POA: Diagnosis not present

## 2018-05-09 DIAGNOSIS — Z79899 Other long term (current) drug therapy: Secondary | ICD-10-CM | POA: Diagnosis not present

## 2018-05-09 DIAGNOSIS — M545 Low back pain: Secondary | ICD-10-CM | POA: Diagnosis not present

## 2018-05-09 DIAGNOSIS — M461 Sacroiliitis, not elsewhere classified: Secondary | ICD-10-CM | POA: Diagnosis not present

## 2018-05-09 DIAGNOSIS — M7062 Trochanteric bursitis, left hip: Secondary | ICD-10-CM | POA: Diagnosis not present

## 2018-06-25 DIAGNOSIS — M7062 Trochanteric bursitis, left hip: Secondary | ICD-10-CM | POA: Diagnosis not present

## 2018-06-25 DIAGNOSIS — M461 Sacroiliitis, not elsewhere classified: Secondary | ICD-10-CM | POA: Diagnosis not present

## 2018-06-25 DIAGNOSIS — M797 Fibromyalgia: Secondary | ICD-10-CM | POA: Diagnosis not present

## 2018-06-25 DIAGNOSIS — M7061 Trochanteric bursitis, right hip: Secondary | ICD-10-CM | POA: Diagnosis not present

## 2018-08-15 DIAGNOSIS — M7061 Trochanteric bursitis, right hip: Secondary | ICD-10-CM | POA: Diagnosis not present

## 2018-08-15 DIAGNOSIS — M7062 Trochanteric bursitis, left hip: Secondary | ICD-10-CM | POA: Diagnosis not present

## 2018-08-15 DIAGNOSIS — M797 Fibromyalgia: Secondary | ICD-10-CM | POA: Diagnosis not present

## 2018-08-15 DIAGNOSIS — M461 Sacroiliitis, not elsewhere classified: Secondary | ICD-10-CM | POA: Diagnosis not present

## 2018-08-21 ENCOUNTER — Other Ambulatory Visit: Payer: Self-pay | Admitting: Primary Care

## 2018-10-17 DIAGNOSIS — M7061 Trochanteric bursitis, right hip: Secondary | ICD-10-CM | POA: Diagnosis not present

## 2018-10-17 DIAGNOSIS — M7062 Trochanteric bursitis, left hip: Secondary | ICD-10-CM | POA: Diagnosis not present

## 2018-10-17 DIAGNOSIS — M461 Sacroiliitis, not elsewhere classified: Secondary | ICD-10-CM | POA: Diagnosis not present

## 2018-10-17 DIAGNOSIS — M797 Fibromyalgia: Secondary | ICD-10-CM | POA: Diagnosis not present

## 2018-11-26 ENCOUNTER — Telehealth: Payer: Self-pay | Admitting: Primary Care

## 2018-11-26 DIAGNOSIS — E119 Type 2 diabetes mellitus without complications: Secondary | ICD-10-CM

## 2018-11-26 MED ORDER — GLIMEPIRIDE 4 MG PO TABS
4.0000 mg | ORAL_TABLET | Freq: Two times a day (BID) | ORAL | 0 refills | Status: DC
Start: 2018-11-26 — End: 2018-12-14

## 2018-11-26 MED ORDER — INSULIN GLARGINE 100 UNIT/ML SOLOSTAR PEN: 14.0000 [IU] | PEN_INJECTOR | Freq: Every evening | SUBCUTANEOUS | 0 refills | Status: DC

## 2018-11-26 MED ORDER — METFORMIN HCL 1000 MG PO TABS: 1000.0000 mg | ORAL_TABLET | Freq: Two times a day (BID) | ORAL | 0 refills | Status: DC

## 2018-11-26 NOTE — Telephone Encounter (Signed)
Refills sent as requested

## 2018-11-26 NOTE — Telephone Encounter (Signed)
Pt needs  1. metFORMIN (GLUCOPHAGE) 1000 MG tablet   2. glimepiride (AMARYL) 4 MG tablet   3. Insulin Glargine (LANTUS SOLOSTAR) 100 UNIT/ML Solostar Pen  Please use [employer]. Pt has AWV/CPE scheduled in March

## 2018-12-06 ENCOUNTER — Encounter: Payer: Self-pay | Admitting: Internal Medicine

## 2018-12-10 DIAGNOSIS — H26492 Other secondary cataract, left eye: Secondary | ICD-10-CM | POA: Diagnosis not present

## 2018-12-10 DIAGNOSIS — H02834 Dermatochalasis of left upper eyelid: Secondary | ICD-10-CM | POA: Diagnosis not present

## 2018-12-10 DIAGNOSIS — H02831 Dermatochalasis of right upper eyelid: Secondary | ICD-10-CM | POA: Diagnosis not present

## 2018-12-10 DIAGNOSIS — H40053 Ocular hypertension, bilateral: Secondary | ICD-10-CM | POA: Diagnosis not present

## 2018-12-10 DIAGNOSIS — E119 Type 2 diabetes mellitus without complications: Secondary | ICD-10-CM | POA: Diagnosis not present

## 2018-12-10 DIAGNOSIS — Z961 Presence of intraocular lens: Secondary | ICD-10-CM | POA: Diagnosis not present

## 2018-12-11 ENCOUNTER — Other Ambulatory Visit: Payer: Self-pay | Admitting: Primary Care

## 2018-12-11 ENCOUNTER — Encounter: Payer: Self-pay | Admitting: Primary Care

## 2018-12-11 ENCOUNTER — Ambulatory Visit (INDEPENDENT_AMBULATORY_CARE_PROVIDER_SITE_OTHER): Payer: Medicare HMO | Admitting: Primary Care

## 2018-12-11 VITALS — BP 148/82 | HR 87 | Temp 98.3°F | Ht 67.0 in | Wt 281.8 lb

## 2018-12-11 DIAGNOSIS — I10 Essential (primary) hypertension: Secondary | ICD-10-CM

## 2018-12-11 DIAGNOSIS — E039 Hypothyroidism, unspecified: Secondary | ICD-10-CM | POA: Diagnosis not present

## 2018-12-11 DIAGNOSIS — E785 Hyperlipidemia, unspecified: Secondary | ICD-10-CM | POA: Diagnosis not present

## 2018-12-11 DIAGNOSIS — N183 Chronic kidney disease, stage 3 unspecified: Secondary | ICD-10-CM | POA: Insufficient documentation

## 2018-12-11 DIAGNOSIS — F419 Anxiety disorder, unspecified: Secondary | ICD-10-CM | POA: Diagnosis not present

## 2018-12-11 DIAGNOSIS — F329 Major depressive disorder, single episode, unspecified: Secondary | ICD-10-CM

## 2018-12-11 DIAGNOSIS — F411 Generalized anxiety disorder: Secondary | ICD-10-CM | POA: Diagnosis not present

## 2018-12-11 DIAGNOSIS — G894 Chronic pain syndrome: Secondary | ICD-10-CM

## 2018-12-11 DIAGNOSIS — E119 Type 2 diabetes mellitus without complications: Secondary | ICD-10-CM

## 2018-12-11 DIAGNOSIS — F32A Depression, unspecified: Secondary | ICD-10-CM

## 2018-12-11 DIAGNOSIS — Z1231 Encounter for screening mammogram for malignant neoplasm of breast: Secondary | ICD-10-CM

## 2018-12-11 LAB — POCT GLYCOSYLATED HEMOGLOBIN (HGB A1C): Hemoglobin A1C: 8.8 % — AB (ref 4.0–5.6)

## 2018-12-11 MED ORDER — ALPRAZOLAM 0.25 MG PO TABS: ORAL_TABLET | ORAL | 0 refills | Status: DC

## 2018-12-11 MED ORDER — VENLAFAXINE HCL ER 37.5 MG PO CP24: 37.5000 mg | ORAL_CAPSULE | Freq: Every day | ORAL | 1 refills | Status: DC

## 2018-12-11 NOTE — Assessment & Plan Note (Signed)
No levothyroxine in years, repeat TSH pending.

## 2018-12-11 NOTE — Assessment & Plan Note (Signed)
No recent follow-up.  A1c today of 8.8 which is exactly what it was in May 2019.  She is already injecting 20 is an insulin that was increased by herself.  We will increase her dose to 25 units and continue her metformin and glimepiride as prescribed.  She is managed on losartan will continue.  She stopped her statin 6 months ago we will resume.  Lipid panel pending.  We discussed the importance of checking glucose especially when on insulin.  She agrees.  I provided her with glucose logs to start checking glucose at least twice daily rotating times of checks.  We will plan to see her back in 3 months with glucose logs.

## 2018-12-11 NOTE — Assessment & Plan Note (Signed)
She stopped her atorvastatin 6 months ago, lipid panel today pending.  LDL likely above goal and will need to initiate statin therapy.  Await labs.

## 2018-12-11 NOTE — Assessment & Plan Note (Signed)
Deteriorated since coming off of venlafaxine over 6 months ago.  Will resume at 37.5 mg daily.  I did agree to provide her with some Xanax to use prior to her upcoming eye procedure.  We discussed this is a one-time thing.

## 2018-12-11 NOTE — Progress Notes (Signed)
Subjective:    Patient ID: Danielle Edwards, female    DOB: June 03, 1956, 63 y.o.   MRN: [id number]  HPI  Ms. Vesey is a 63 year old female who presents today with multiple complaints and follow-up.  1) Sore throat: Also with left sided ear pain, voice hoarseness, nasal congestion, post nasal drip, pain to the left jaw with radiation up to her left ear. Her symptoms began four days ago. She denies fevers. She was exposed to her granddaughter who was recently sick.  She is not taken anything over-the-counter.  2) CKD Stage III: Chronic for the last several years, last visit with nephrologist was in 2012, no follow up visit since.   3) Essential Hypertension: Currently managed on losartan 100 mg and amlodipine 10 mg. She was once managed on HCTZ 12.5 mg for which she stopped taking 6 months ago on her own. She is not checking her BP at home.  BP Readings from Last 3 Encounters:  12/11/18 (!) 148/82  02/06/18 136/76  01/23/18 140/76   4) Type 2 Diabetes:  Current medications include: Metformin 1000 mg twice daily, Glimepiride 8 mg daily, Lantus 14 units. She has actually been injecting varying amounts of insulin ranging from 14-22 "based off of my sugars".   She was checking her blood glucose once daily and got getting readings of: AM Fasting: 120-150's  She has not checked her glucose in 2 weeks. She is consistently injecting 20 units of Lantus every morning.   Last A1C: 8.8 in May 2019 Last Eye Exam: Completed recently  ACE/ARB: Losartan  Statin: atorvastatin, stopped 6 months ago  5) GAD: Currently prescribed venlafaxine ER 37.5 mg. She has not taken this medication in 6 months. She experiences a lot of anxiety when it comes to doctors appointments and finds herself canceling appointments due to this.   Since she's been without her venlafaxine she's felt nervous, anxious, depressed. Thinks she may have felt better when taking, also thinks she may have needed a dose increase. She  would like to resume today.   She underwent a cataract procedure to the left eye several days ago, took an old Xanax prior to her procedure and did well. She will be undergoing another left eye procedure in late March 2020 and would like to have a Xanax to use prior to this procedure.  Her ophthalmologist declined this.   Review of Systems  Constitutional: Positive for fatigue. Negative for fever.  HENT: Positive for ear pain and sore throat. Negative for congestion.   Respiratory: Negative for cough and shortness of breath.   Cardiovascular: Negative for chest pain.  Gastrointestinal: Negative for abdominal pain.  Neurological: Negative for dizziness and headaches.  Psychiatric/Behavioral: The patient is nervous/anxious.        See HPI       Past Medical History:  Diagnosis Date  . Anemia    Iron defiency   . Chronic pain   . Diabetes type 2, controlled ([place])   . Headache   . History of colonic polyps 1999   Benign  . Hx of adenomatous polyp of colon 09/27/2000   2001 - diminutive adenoma [place] 2007 no polyps - [place]  . Hypertension   . Hypothyroidism   . IBS (irritable bowel syndrome)    Diarrhea type  . Vitamin D deficiency      Social History   Socioeconomic History  . Marital status: Married    Spouse name: Not on file  . Number of children: Not  on file  . Years of education: Not on file  . Highest education level: Not on file  Occupational History  . Not on file  Social Needs  . Financial resource strain: Not on file  . Food insecurity:    Worry: Not on file    Inability: Not on file  . Transportation needs:    Medical: Not on file    Non-medical: Not on file  Tobacco Use  . Smoking status: Never Smoker  . Smokeless tobacco: Never Used  Substance and Sexual Activity  . Alcohol use: Yes    Alcohol/week: 0.0 standard drinks    Comment: rarely  . Drug use: No  . Sexual activity: Not on file  Lifestyle  . Physical activity:    Days per week:  Not on file    Minutes per session: Not on file  . Stress: Not on file  Relationships  . Social connections:    Talks on phone: Not on file    Gets together: Not on file    Attends religious service: Not on file    Active member of club or organization: Not on file    Attends meetings of clubs or organizations: Not on file    Relationship status: Not on file  . Intimate partner violence:    Fear of current or ex partner: Not on file    Emotionally abused: Not on file    Physically abused: Not on file    Forced sexual activity: Not on file  Other Topics Concern  . Not on file  Social History Narrative   Married.   3 children.   Retired, worked at [employer] in [place], [place].   Enjoys being with her grandchildren, being with her friends.    Past Surgical History:  Procedure Laterality Date  . CHOLECYSTECTOMY  1995  . COLONOSCOPY  multiple  . NASAL SINUS SURGERY  2001  . TONSILLECTOMY AND ADENOIDECTOMY    . VAGINAL HYSTERECTOMY     Complete    Family History  Problem Relation Age of Onset  . Colon cancer Mother   . Lung cancer Mother   . Skin cancer Mother   . Heart disease Father   . Diabetes Father   . Skin cancer Father     No Known Allergies  Current Outpatient Medications on File Prior to Visit  Medication Sig Dispense Refill  . ACCU-CHEK FASTCLIX LANCETS MISC USE AS INSTRUCTED TO CHECK BLOOD SUGAR UP TO 3 TIMES DAILY 306 each 1  . amLODipine (NORVASC) 10 MG tablet Take 1 tablet (10 mg total) by mouth daily. 90 tablet 1  . atorvastatin (LIPITOR) 40 MG tablet Take 1 tablet (40 mg total) by mouth at bedtime. 90 tablet 3  . Blood Glucose Monitoring Suppl (ACCU-CHEK AVIVA PLUS) w/Device KIT Use as instructed to test blood sugar 3 times daily 1 kit 0  . glimepiride (AMARYL) 4 MG tablet Take 1 tablet (4 mg total) by mouth 2 (two) times daily. 60 tablet 0  . glucose blood (ACCU-CHEK AVIVA) test strip Use as instructed to test blood sugar up to 3 times daily 300 each 1  .  Insulin Glargine (LANTUS SOLOSTAR) 100 UNIT/ML Solostar Pen Inject 14 Units into the skin every evening. 15 mL 0  . Insulin Pen Needle 32G X 6 MM MISC Use as instructed to inject insulin daily at bedtime 100 each 3  . loperamide (IMODIUM A-D) 2 MG tablet Take 2 mg by mouth 4 (four) times daily as needed  for diarrhea or loose stools.    Marland Kitchen losartan (COZAAR) 100 MG tablet Take 1 tablet (100 mg total) by mouth daily. 90 tablet 1  . metFORMIN (GLUCOPHAGE) 1000 MG tablet Take 1 tablet (1,000 mg total) by mouth 2 (two) times daily with a meal. 60 tablet 0  . oxyCODONE (OXY IR/ROXICODONE) 5 MG immediate release tablet Take 5 mg by mouth 4 (four) times daily as needed.    . pantoprazole (PROTONIX) 20 MG tablet Take 1 tablet (20 mg total) by mouth daily. 90 tablet 0  . promethazine (PHENERGAN) 25 MG tablet Take 25 mg by mouth every 6 (six) hours as needed. Reported on 09/21/2015  1   No current facility-administered medications on file prior to visit.     BP (!) 148/82   Pulse 87   Temp 98.3 F (36.8 C) (Oral)   Ht _0  (1.702 m)   Wt 281 lb 12 oz (127.8 kg)   SpO2 98%   BMI 44.13 kg/m    Objective:   Physical Exam  Constitutional: She appears well-nourished. She does not appear ill.  HENT:  Right Ear: Tympanic membrane and ear canal normal.  Left Ear: Tympanic membrane and ear canal normal.  Nose: No mucosal edema. Right sinus exhibits no maxillary sinus tenderness and no frontal sinus tenderness. Left sinus exhibits no maxillary sinus tenderness and no frontal sinus tenderness.  Mouth/Throat: Oropharynx is clear and moist.  Neck: Neck supple.  Cardiovascular: Normal rate and regular rhythm.  Respiratory: Effort normal and breath sounds normal. She has no wheezes.  Skin: Skin is warm and dry.  Psychiatric: She has a normal mood and affect.           Assessment & Plan:  URI versus allergic rhinitis:  Sore throat, postnasal drip, fatigue x4 days. Exam today with clear lungs and  overall benign. Suspect viral versus allergy etiology and will treat with conservative measures. Return precautions provided.  Pleas Koch, NP

## 2018-12-11 NOTE — Assessment & Plan Note (Signed)
Above goal in the office today, is compliant to losartan and amlodipine but not HCTZ.  Given history of CKD stage III we will continue off of HCTZ for now and recheck renal function.  Consider metoprolol if needed for BP control.

## 2018-12-11 NOTE — Assessment & Plan Note (Signed)
Once seeing nephrology years ago, no recent follow-up visit. She has been off of HCTZ for 6 months so we will recheck renal function today to see if this was contributing to steady decline.  Continue losartan.  The goal also is to gain better control of her blood pressure, discussed this with patient today.

## 2018-12-11 NOTE — Patient Instructions (Signed)
Stop by the lab prior to leaving today. I will notify you of your results once received.   We've increased the dose of your Lantus to 25 units. Inject this everyday. Continue your Metformin 1000 mg twice daily and your glimepiride 8 mg daily.  Check your glucose twice daily, rotating times of checks.   I sent refills of your venlafaxine to the pharmacy. Take 1 capsule daily for anxiety and depression.  Use the Xanax only for your eye procedure.  Follow up with Katha Cabal as scheduled.  Please schedule a follow up appointment in 3 months, bring your glucose logs.  It was a pleasure to see you today!

## 2018-12-11 NOTE — Assessment & Plan Note (Signed)
Continues to follow with orthopedics and is taking oxycodone sparingly.  Uses Phenergan as needed with oxycodone.

## 2018-12-12 DIAGNOSIS — M797 Fibromyalgia: Secondary | ICD-10-CM | POA: Diagnosis not present

## 2018-12-12 DIAGNOSIS — M7062 Trochanteric bursitis, left hip: Secondary | ICD-10-CM | POA: Diagnosis not present

## 2018-12-12 DIAGNOSIS — M461 Sacroiliitis, not elsewhere classified: Secondary | ICD-10-CM | POA: Diagnosis not present

## 2018-12-12 DIAGNOSIS — M7061 Trochanteric bursitis, right hip: Secondary | ICD-10-CM | POA: Diagnosis not present

## 2018-12-12 LAB — COMPREHENSIVE METABOLIC PANEL
ALBUMIN: 4.2 g/dL (ref 3.5–5.2)
ALT: 29 U/L (ref 0–35)
AST: 30 U/L (ref 0–37)
Alkaline Phosphatase: 88 U/L (ref 39–117)
BUN: 28 mg/dL — ABNORMAL HIGH (ref 6–23)
CHLORIDE: 100 meq/L (ref 96–112)
CO2: 27 mEq/L (ref 19–32)
Calcium: 10.6 mg/dL — ABNORMAL HIGH (ref 8.4–10.5)
Creatinine, Ser: 1.2 mg/dL (ref 0.40–1.20)
GFR: 45.28 mL/min — ABNORMAL LOW (ref >60.00)
Glucose, Bld: 179 mg/dL — ABNORMAL HIGH (ref 70–99)
Potassium: 4.2 mEq/L (ref 3.5–5.1)
Sodium: 136 mEq/L (ref 135–145)
Total Bilirubin: 0.2 mg/dL (ref 0.2–1.2)
Total Protein: 7.9 g/dL (ref 6.0–8.3)

## 2018-12-12 LAB — TSH: TSH: 4.58 u[IU]/mL — ABNORMAL HIGH (ref 0.35–4.50)

## 2018-12-12 LAB — LIPID PANEL
Cholesterol: 196 mg/dL (ref 0–200)
HDL: 45.9 mg/dL (ref >39.00)
LDL Cholesterol: 112 mg/dL — ABNORMAL HIGH (ref 0–99)
NonHDL: 149.63
Total CHOL/HDL Ratio: 4
Triglycerides: 190 mg/dL — ABNORMAL HIGH (ref 0.0–149.0)
VLDL: 38 mg/dL (ref 0.0–40.0)

## 2018-12-14 ENCOUNTER — Other Ambulatory Visit: Payer: Self-pay | Admitting: Primary Care

## 2018-12-14 DIAGNOSIS — N183 Chronic kidney disease, stage 3 unspecified: Secondary | ICD-10-CM

## 2018-12-14 DIAGNOSIS — E039 Hypothyroidism, unspecified: Secondary | ICD-10-CM

## 2018-12-14 DIAGNOSIS — I10 Essential (primary) hypertension: Secondary | ICD-10-CM

## 2018-12-14 DIAGNOSIS — E119 Type 2 diabetes mellitus without complications: Secondary | ICD-10-CM

## 2018-12-14 DIAGNOSIS — E1169 Type 2 diabetes mellitus with other specified complication: Secondary | ICD-10-CM

## 2018-12-14 DIAGNOSIS — E785 Hyperlipidemia, unspecified: Secondary | ICD-10-CM

## 2018-12-14 MED ORDER — GLIMEPIRIDE 4 MG PO TABS: 8.0000 mg | ORAL_TABLET | Freq: Every day | ORAL | 3 refills | Status: DC

## 2018-12-14 MED ORDER — METFORMIN HCL 1000 MG PO TABS: 1000.0000 mg | ORAL_TABLET | Freq: Two times a day (BID) | ORAL | 3 refills | Status: DC

## 2018-12-14 MED ORDER — LEVOTHYROXINE SODIUM 25 MCG PO TABS: ORAL_TABLET | ORAL | 0 refills | Status: DC

## 2018-12-14 MED ORDER — INSULIN GLARGINE 100 UNIT/ML SOLOSTAR PEN: 25.0000 [IU] | PEN_INJECTOR | Freq: Every day | SUBCUTANEOUS | 5 refills | Status: DC

## 2018-12-14 MED ORDER — INSULIN PEN NEEDLE 32G X 6 MM MISC: 3 refills | Status: DC

## 2018-12-14 MED ORDER — ATORVASTATIN CALCIUM 40 MG PO TABS: 40.0000 mg | ORAL_TABLET | Freq: Every day | ORAL | 3 refills | Status: DC

## 2018-12-14 MED ORDER — AMLODIPINE BESYLATE 10 MG PO TABS: 10.0000 mg | ORAL_TABLET | Freq: Every day | ORAL | 3 refills | Status: DC

## 2018-12-19 ENCOUNTER — Telehealth: Payer: Self-pay | Admitting: Primary Care

## 2018-12-19 DIAGNOSIS — J019 Acute sinusitis, unspecified: Secondary | ICD-10-CM

## 2018-12-19 MED ORDER — AMOXICILLIN-POT CLAVULANATE 875-125 MG PO TABS: 1.0000 | ORAL_TABLET | Freq: Two times a day (BID) | ORAL | 0 refills | Status: DC

## 2018-12-19 NOTE — Telephone Encounter (Signed)
Per DPR, left detail message of [name] Clark's comments for patient. 

## 2018-12-19 NOTE — Telephone Encounter (Signed)
Spoken to patient. Patient stated that she feels worse. More nasal drainage. Some more coughing but no fever.

## 2018-12-19 NOTE — Telephone Encounter (Signed)
Noted.  Please notify patient that I sent a prescription for antibiotics to her pharmacy.  Start Augmentin antibiotics for the infection. Take 1 tablet by mouth twice daily for 7 days.

## 2018-12-19 NOTE — Telephone Encounter (Signed)
Pt came in the office 3.10.20 for sinus problems and stated she has gotten worse and want to know if something can be called in for her or do she need to come in the office to be seen. Pt want call back from [facility] to [place]/Pharmacy

## 2018-12-24 ENCOUNTER — Ambulatory Visit: Payer: Self-pay

## 2018-12-25 ENCOUNTER — Ambulatory Visit: Payer: Self-pay

## 2018-12-31 ENCOUNTER — Other Ambulatory Visit: Payer: Self-pay | Admitting: Primary Care

## 2018-12-31 DIAGNOSIS — I10 Essential (primary) hypertension: Secondary | ICD-10-CM

## 2019-01-01 ENCOUNTER — Encounter: Payer: Self-pay | Admitting: Primary Care

## 2019-02-04 DIAGNOSIS — M797 Fibromyalgia: Secondary | ICD-10-CM | POA: Diagnosis not present

## 2019-02-04 DIAGNOSIS — M7061 Trochanteric bursitis, right hip: Secondary | ICD-10-CM | POA: Diagnosis not present

## 2019-02-04 DIAGNOSIS — M7062 Trochanteric bursitis, left hip: Secondary | ICD-10-CM | POA: Diagnosis not present

## 2019-02-04 DIAGNOSIS — M461 Sacroiliitis, not elsewhere classified: Secondary | ICD-10-CM | POA: Diagnosis not present

## 2019-02-20 DIAGNOSIS — R600 Localized edema: Secondary | ICD-10-CM | POA: Diagnosis not present

## 2019-02-20 DIAGNOSIS — N183 Chronic kidney disease, stage 3 (moderate): Secondary | ICD-10-CM | POA: Diagnosis not present

## 2019-02-20 DIAGNOSIS — E1129 Type 2 diabetes mellitus with other diabetic kidney complication: Secondary | ICD-10-CM | POA: Diagnosis not present

## 2019-02-20 DIAGNOSIS — I129 Hypertensive chronic kidney disease with stage 1 through stage 4 chronic kidney disease, or unspecified chronic kidney disease: Secondary | ICD-10-CM | POA: Diagnosis not present

## 2019-03-11 DIAGNOSIS — H26492 Other secondary cataract, left eye: Secondary | ICD-10-CM | POA: Diagnosis not present

## 2019-03-13 ENCOUNTER — Ambulatory Visit: Payer: Self-pay | Admitting: Primary Care

## 2019-03-25 ENCOUNTER — Other Ambulatory Visit: Payer: Self-pay | Admitting: Primary Care

## 2019-03-25 DIAGNOSIS — E039 Hypothyroidism, unspecified: Secondary | ICD-10-CM

## 2019-04-01 DIAGNOSIS — M7062 Trochanteric bursitis, left hip: Secondary | ICD-10-CM | POA: Diagnosis not present

## 2019-04-01 DIAGNOSIS — M7061 Trochanteric bursitis, right hip: Secondary | ICD-10-CM | POA: Diagnosis not present

## 2019-04-01 DIAGNOSIS — Z79899 Other long term (current) drug therapy: Secondary | ICD-10-CM | POA: Diagnosis not present

## 2019-04-01 DIAGNOSIS — M461 Sacroiliitis, not elsewhere classified: Secondary | ICD-10-CM | POA: Diagnosis not present

## 2019-04-01 DIAGNOSIS — M797 Fibromyalgia: Secondary | ICD-10-CM | POA: Diagnosis not present

## 2019-05-01 ENCOUNTER — Ambulatory Visit
Admission: RE | Admit: 2019-05-01 | Discharge: 2019-05-01 | Disposition: A | Discharge status: Home / Self Care | Payer: Medicare HMO | Source: Ambulatory Visit | Attending: Primary Care | Admitting: Primary Care

## 2019-05-01 ENCOUNTER — Other Ambulatory Visit: Payer: Self-pay

## 2019-05-01 DIAGNOSIS — Z1231 Encounter for screening mammogram for malignant neoplasm of breast: Secondary | ICD-10-CM | POA: Insufficient documentation

## 2019-05-29 ENCOUNTER — Telehealth: Payer: Self-pay | Admitting: Primary Care

## 2019-05-29 NOTE — Telephone Encounter (Signed)
Nephrology referral denied/canceled. "Rejection Reason - Patient did not respond - Unable to reach patient to schedule referral. LVM 3/25, 3/31 and 4/7" [employer]

## 2019-05-30 DIAGNOSIS — M7061 Trochanteric bursitis, right hip: Secondary | ICD-10-CM | POA: Diagnosis not present

## 2019-05-30 DIAGNOSIS — M7062 Trochanteric bursitis, left hip: Secondary | ICD-10-CM | POA: Diagnosis not present

## 2019-05-30 DIAGNOSIS — M461 Sacroiliitis, not elsewhere classified: Secondary | ICD-10-CM | POA: Diagnosis not present

## 2019-05-30 DIAGNOSIS — M797 Fibromyalgia: Secondary | ICD-10-CM | POA: Diagnosis not present

## 2019-06-18 ENCOUNTER — Other Ambulatory Visit: Payer: Self-pay | Admitting: Primary Care

## 2019-06-18 DIAGNOSIS — I10 Essential (primary) hypertension: Secondary | ICD-10-CM

## 2019-08-16 DIAGNOSIS — M461 Sacroiliitis, not elsewhere classified: Secondary | ICD-10-CM | POA: Diagnosis not present

## 2019-08-16 DIAGNOSIS — M7061 Trochanteric bursitis, right hip: Secondary | ICD-10-CM | POA: Diagnosis not present

## 2019-08-16 DIAGNOSIS — M797 Fibromyalgia: Secondary | ICD-10-CM | POA: Diagnosis not present

## 2019-08-16 DIAGNOSIS — M7062 Trochanteric bursitis, left hip: Secondary | ICD-10-CM | POA: Diagnosis not present

## 2019-08-26 DIAGNOSIS — N189 Chronic kidney disease, unspecified: Secondary | ICD-10-CM | POA: Diagnosis not present

## 2019-08-26 DIAGNOSIS — E559 Vitamin D deficiency, unspecified: Secondary | ICD-10-CM | POA: Diagnosis not present

## 2019-08-26 DIAGNOSIS — Z6841 Body Mass Index (BMI) 40.0 and over, adult: Secondary | ICD-10-CM | POA: Diagnosis not present

## 2019-08-26 DIAGNOSIS — D631 Anemia in chronic kidney disease: Secondary | ICD-10-CM | POA: Diagnosis not present

## 2019-08-26 DIAGNOSIS — N1831 Chronic kidney disease, stage 3a: Secondary | ICD-10-CM | POA: Diagnosis not present

## 2019-08-26 DIAGNOSIS — E1122 Type 2 diabetes mellitus with diabetic chronic kidney disease: Secondary | ICD-10-CM | POA: Diagnosis not present

## 2019-08-26 DIAGNOSIS — I10 Essential (primary) hypertension: Secondary | ICD-10-CM | POA: Diagnosis not present

## 2019-09-02 ENCOUNTER — Other Ambulatory Visit: Payer: Self-pay | Admitting: Primary Care

## 2019-09-02 DIAGNOSIS — I10 Essential (primary) hypertension: Secondary | ICD-10-CM

## 2019-09-02 DIAGNOSIS — E119 Type 2 diabetes mellitus without complications: Secondary | ICD-10-CM

## 2019-09-12 ENCOUNTER — Ambulatory Visit (INDEPENDENT_AMBULATORY_CARE_PROVIDER_SITE_OTHER): Payer: Medicare HMO

## 2019-09-12 ENCOUNTER — Other Ambulatory Visit: Payer: Self-pay

## 2019-09-12 DIAGNOSIS — Z0000 Encounter for general adult medical examination without abnormal findings: Secondary | ICD-10-CM

## 2019-09-12 NOTE — Patient Instructions (Signed)
Danielle Edwards , Thank you for taking time to come for your Medicare Wellness Visit. I appreciate your ongoing commitment to your health goals. Please review the following plan we discussed and let me know if I can assist you in the future.   Screening recommendations/referrals: Colonoscopy: Patient will call and schedule this in January Mammogram: Up to date, completed 05/01/2019 Bone Density: Up to date, completed 06/22/2015 Recommended yearly ophthalmology/optometry visit for glaucoma screening and checkup Recommended yearly dental visit for hygiene and checkup  Vaccinations: Influenza vaccine: Up to date, completed 09/11/2019 Pneumococcal vaccine: @ age 26 Tdap vaccine: decline Shingles vaccine: Completed series    Advanced directives: Advance directive discussed with you today. Even though you declined this today please call our office should you change your mind and we can give you the proper paperwork for you to fill out.  Conditions/risks identified: diabetes, hypertension, hyperlipidemia  Next appointment: 09/17/2019 @ 2 pm   Preventive Care 40-64 Years, Female Preventive care refers to lifestyle choices and visits with your health care provider that can promote health and wellness. What does preventive care include?  A yearly physical exam. This is also called an annual well check.  Dental exams once or twice a year.  Routine eye exams. Ask your health care provider how often you should have your eyes checked.  Personal lifestyle choices, including:  Daily care of your teeth and gums.  Regular physical activity.  Eating a healthy diet.  Avoiding tobacco and drug use.  Limiting alcohol use.  Practicing safe sex.  Taking low-dose aspirin daily starting at age 20.  Taking vitamin and mineral supplements as recommended by your health care provider. What happens during an annual well check? The services and screenings done by your health care provider during your  annual well check will depend on your age, overall health, lifestyle risk factors, and family history of disease. Counseling  Your health care provider may ask you questions about your:  Alcohol use.  Tobacco use.  Drug use.  Emotional well-being.  Home and relationship well-being.  Sexual activity.  Eating habits.  Work and work Statistician.  Method of birth control.  Menstrual cycle.  Pregnancy history. Screening  You may have the following tests or measurements:  Height, weight, and BMI.  Blood pressure.  Lipid and cholesterol levels. These may be checked every 5 years, or more frequently if you are over 54 years old.  Skin check.  Lung cancer screening. You may have this screening every year starting at age 79 if you have a 30-pack-year history of smoking and currently smoke or have quit within the past 15 years.  Fecal occult blood test (FOBT) of the stool. You may have this test every year starting at age 72.  Flexible sigmoidoscopy or colonoscopy. You may have a sigmoidoscopy every 5 years or a colonoscopy every 10 years starting at age 33.  Hepatitis C blood test.  Hepatitis B blood test.  Sexually transmitted disease (STD) testing.  Diabetes screening. This is done by checking your blood sugar (glucose) after you have not eaten for a while (fasting). You may have this done every 1-3 years.  Mammogram. This may be done every 1-2 years. Talk to your health care provider about when you should start having regular mammograms. This may depend on whether you have a family history of breast cancer.  BRCA-related cancer screening. This may be done if you have a family history of breast, ovarian, tubal, or peritoneal cancers.  Pelvic exam  and Pap test. This may be done every 3 years starting at age 61. Starting at age 76, this may be done every 5 years if you have a Pap test in combination with an HPV test.  Bone density scan. This is done to screen for  osteoporosis. You may have this scan if you are at high risk for osteoporosis. Discuss your test results, treatment options, and if necessary, the need for more tests with your health care provider. Vaccines  Your health care provider may recommend certain vaccines, such as:  Influenza vaccine. This is recommended every year.  Tetanus, diphtheria, and acellular pertussis (Tdap, Td) vaccine. You may need a Td booster every 10 years.  Zoster vaccine. You may need this after age 20.  Pneumococcal 13-valent conjugate (PCV13) vaccine. You may need this if you have certain conditions and were not previously vaccinated.  Pneumococcal polysaccharide (PPSV23) vaccine. You may need one or two doses if you smoke cigarettes or if you have certain conditions. Talk to your health care provider about which screenings and vaccines you need and how often you need them. This information is not intended to replace advice given to you by your health care provider. Make sure you discuss any questions you have with your health care provider. Document Released: 10/16/2015 Document Revised: 06/08/2016 Document Reviewed: 07/21/2015 Elsevier Interactive Patient Education  2017 [place] Prevention in the Home Falls can cause injuries. They can happen to people of all ages. There are many things you can do to make your home safe and to help prevent falls. What can I do on the outside of my home?  Regularly fix the edges of walkways and driveways and fix any cracks.  Remove anything that might make you trip as you walk through a door, such as a raised step or threshold.  Trim any bushes or trees on the path to your home.  Use bright outdoor lighting.  Clear any walking paths of anything that might make someone trip, such as rocks or tools.  Regularly check to see if handrails are loose or broken. Make sure that both sides of any steps have handrails.  Any raised decks and porches should have  guardrails on the edges.  Have any leaves, snow, or ice cleared regularly.  Use sand or salt on walking paths during winter.  Clean up any spills in your garage right away. This includes oil or grease spills. What can I do in the bathroom?  Use night lights.  Install grab bars by the toilet and in the tub and shower. Do not use towel bars as grab bars.  Use non-skid mats or decals in the tub or shower.  If you need to sit down in the shower, use a plastic, non-slip stool.  Keep the floor dry. Clean up any water that spills on the floor as soon as it happens.  Remove soap buildup in the tub or shower regularly.  Attach bath mats securely with double-sided non-slip rug tape.  Do not have throw rugs and other things on the floor that can make you trip. What can I do in the bedroom?  Use night lights.  Make sure that you have a light by your bed that is easy to reach.  Do not use any sheets or blankets that are too big for your bed. They should not hang down onto the floor.  Have a firm chair that has side arms. You can use this for support while  you get dressed.  Do not have throw rugs and other things on the floor that can make you trip. What can I do in the kitchen?  Clean up any spills right away.  Avoid walking on wet floors.  Keep items that you use a lot in easy-to-reach places.  If you need to reach something above you, use a strong step stool that has a grab bar.  Keep electrical cords out of the way.  Do not use floor polish or wax that makes floors slippery. If you must use wax, use non-skid floor wax.  Do not have throw rugs and other things on the floor that can make you trip. What can I do with my stairs?  Do not leave any items on the stairs.  Make sure that there are handrails on both sides of the stairs and use them. Fix handrails that are broken or loose. Make sure that handrails are as long as the stairways.  Check any carpeting to make sure that  it is firmly attached to the stairs. Fix any carpet that is loose or worn.  Avoid having throw rugs at the top or bottom of the stairs. If you do have throw rugs, attach them to the floor with carpet tape.  Make sure that you have a light switch at the top of the stairs and the bottom of the stairs. If you do not have them, ask someone to add them for you. What else can I do to help prevent falls?  Wear shoes that:  Do not have high heels.  Have rubber bottoms.  Are comfortable and fit you well.  Are closed at the toe. Do not wear sandals.  If you use a stepladder:  Make sure that it is fully opened. Do not climb a closed stepladder.  Make sure that both sides of the stepladder are locked into place.  Ask someone to hold it for you, if possible.  Clearly mark and make sure that you can see:  Any grab bars or handrails.  First and last steps.  Where the edge of each step is.  Use tools that help you move around (mobility aids) if they are needed. These include:  Canes.  Walkers.  Scooters.  Crutches.  Turn on the lights when you go into a dark area. Replace any light bulbs as soon as they burn out.  Set up your furniture so you have a clear path. Avoid moving your furniture around.  If any of your floors are uneven, fix them.  If there are any pets around you, be aware of where they are.  Review your medicines with your doctor. Some medicines can make you feel dizzy. This can increase your chance of falling. Ask your doctor what other things that you can do to help prevent falls. This information is not intended to replace advice given to you by your health care provider. Make sure you discuss any questions you have with your health care provider. Document Released: 07/16/2009 Document Revised: 02/25/2016 Document Reviewed: 10/24/2014 Elsevier Interactive Patient Education  2017 [employer].

## 2019-09-12 NOTE — Progress Notes (Signed)
PCP notes:  Health Maintenance: Patient will schedule colonoscopy in January    Abnormal Screenings: none   Patient concerns: Patient is concerned about her persistent anemia.    Nurse concerns: none   Next PCP appt.: 09/17/2019 @ 2 pm

## 2019-09-12 NOTE — Progress Notes (Signed)
Subjective:   Danielle Edwards is a 63 y.o. female who presents for an Initial Medicare Annual Wellness Visit.  Review of Systems: N/A      This visit is being conducted through telemedicine via telephone at the nurse health advisor's home address due to the COVID-19 pandemic. This patient has given me verbal consent via doximity to conduct this visit, patient states they are participating from their home address. Patient and myself are on the telephone call. There is no referral for this visit. Some vital signs may be absent or patient reported.    Patient identification: identified by name, DOB, and current address    Cardiac Risk Factors include: advanced age (>26mn, >>7women);diabetes mellitus;hypertension;dyslipidemia     Objective:    Today's Vitals   09/12/19 1028  PainSc: 6    There is no height or weight on file to calculate BMI.  Advanced Directives 09/12/2019 08/17/2015  Does Patient Have a Medical Advance Directive? No No  Would patient like information on creating a medical advance directive? No - Patient declined -    Current Medications (verified) Outpatient Encounter Medications as of 09/12/2019  Medication Sig  . ACCU-CHEK FASTCLIX LANCETS MISC USE AS INSTRUCTED TO CHECK BLOOD SUGAR UP TO 3 TIMES DAILY  . ALPRAZolam (XANAX) 0.25 MG tablet Take 1-2 tablets by mouth 30 minutes prior to eye procedure.  .Marland KitchenamLODipine (NORVASC) 10 MG tablet Take 1 tablet (10 mg total) by mouth daily. For blood pressure.  .Marland Kitchenatorvastatin (LIPITOR) 40 MG tablet Take 1 tablet (40 mg total) by mouth at bedtime. For cholesterol.  . Blood Glucose Monitoring Suppl (ACCU-CHEK AVIVA PLUS) w/Device KIT Use as instructed to test blood sugar 3 times daily  . glimepiride (AMARYL) 4 MG tablet TAKE 2 TABLETS BY MOUTH ONCE DAILY WITH BREAKFAST FOR DIABETES  . glucose blood (ACCU-CHEK AVIVA) test strip Use as instructed to test blood sugar up to 3 times daily  . Insulin Glargine (LANTUS SOLOSTAR)  100 UNIT/ML Solostar Pen Inject 25 Units into the skin daily.  . Insulin Pen Needle 32G X 6 MM MISC Use as instructed to inject insulin daily at bedtime  . loperamide (IMODIUM A-D) 2 MG tablet Take 2 mg by mouth 4 (four) times daily as needed for diarrhea or loose stools.  .Marland Kitchenlosartan (COZAAR) 100 MG tablet TAKE 1 TABLET BY MOUTH ONCE A DAY  . metFORMIN (GLUCOPHAGE) 1000 MG tablet Take 1 tablet (1,000 mg total) by mouth 2 (two) times daily with a meal. For diabetes.  .Marland KitchenoxyCODONE (OXY IR/ROXICODONE) 5 MG immediate release tablet Take 5 mg by mouth 4 (four) times daily as needed.  . pantoprazole (PROTONIX) 20 MG tablet Take 1 tablet (20 mg total) by mouth daily.  . promethazine (PHENERGAN) 25 MG tablet Take 25 mg by mouth every 6 (six) hours as needed. Reported on 09/21/2015  . venlafaxine XR (EFFEXOR XR) 37.5 MG 24 hr capsule Take 1 capsule (37.5 mg total) by mouth daily with breakfast. For anxiety and depression.  .Marland Kitchenamoxicillin-clavulanate (AUGMENTIN) 875-125 MG tablet Take 1 tablet by mouth 2 (two) times daily. (Patient not taking: Reported on 09/12/2019)  . levothyroxine (SYNTHROID) 25 MCG tablet TAKE 1 TAB BY MOUTH ONCE DAILY. TAKE ON AN EMPTY STOMACH WITH A GLASSOF WATER ATLEAST 30-60 MINUTES BEFORE BREAKFAST (Patient not taking: Reported on 09/12/2019)   No facility-administered encounter medications on file as of 09/12/2019.    Allergies (verified) Patient has no known allergies.   History: Past Medical History:  Diagnosis Date  . Anemia    Iron defiency   . Chronic pain   . Diabetes type 2, controlled ([place])   . Headache   . History of colonic polyps 1999   Benign  . Hx of adenomatous polyp of colon 09/27/2000   2001 - diminutive adenoma [place] 2007 no polyps - [place]  . Hypertension   . Hypothyroidism   . IBS (irritable bowel syndrome)    Diarrhea type  . Vitamin D deficiency    Past Surgical History:  Procedure Laterality Date  . CHOLECYSTECTOMY  1995  .  COLONOSCOPY  multiple  . NASAL SINUS SURGERY  2001  . TONSILLECTOMY AND ADENOIDECTOMY    . VAGINAL HYSTERECTOMY     Complete   Family History  Problem Relation Age of Onset  . Colon cancer Mother   . Lung cancer Mother   . Skin cancer Mother   . Heart disease Father   . Diabetes Father   . Skin cancer Father   . Breast cancer Neg Hx    Social History   Socioeconomic History  . Marital status: Married    Spouse name: Not on file  . Number of children: Not on file  . Years of education: Not on file  . Highest education level: Not on file  Occupational History  . Not on file  Tobacco Use  . Smoking status: Never Smoker  . Smokeless tobacco: Never Used  Substance and Sexual Activity  . Alcohol use: Yes    Alcohol/week: 0.0 standard drinks    Comment: rarely  . Drug use: No  . Sexual activity: Not on file  Other Topics Concern  . Not on file  Social History Narrative   Married.   3 children.   Retired, worked at [employer] in [place], [place].   Enjoys being with her grandchildren, being with her friends.   Social Determinants of Health   Financial Resource Strain:   . Difficulty of Paying Living Expenses: Not on file  Food Insecurity:   . Worried About Charity fundraiser in the Last Year: Not on file  . Ran Out of Food in the Last Year: Not on file  Transportation Needs:   . Lack of Transportation (Medical): Not on file  . Lack of Transportation (Non-Medical): Not on file  Physical Activity:   . Days of Exercise per Week: Not on file  . Minutes of Exercise per Session: Not on file  Stress:   . Feeling of Stress : Not on file  Social Connections:   . Frequency of Communication with Friends and Family: Not on file  . Frequency of Social Gatherings with Friends and Family: Not on file  . Attends Religious Services: Not on file  . Active Member of Clubs or Organizations: Not on file  . Attends Archivist Meetings: Not on file  . Marital Status: Not on file     Tobacco Counseling Counseling given: Not Answered   Clinical Intake:  Pre-visit preparation completed: Yes  Pain : 0-10 Pain Score: 6  Pain Type: Chronic pain Pain Location: (all over body (fibromyalgia)) Pain Descriptors / Indicators: Aching Pain Onset: More than a month ago Pain Frequency: Intermittent     Nutritional Risks: Nausea/ vomitting/ diarrhea(diarrhea sometimes (IBS)) Diabetes: Yes CBG done?: No Did pt. bring in CBG monitor from home?: No  How often do you need to have someone help you when you read instructions, pamphlets, or other written materials from your doctor or pharmacy?:  1 - Never What is the last grade level you completed in school?: 12th  Interpreter Needed?: No  Information entered by :: CJohnson, LPN   Activities of Daily Living In your present state of health, do you have any difficulty performing the following activities: 09/12/2019  Hearing? N  Vision? N  Difficulty concentrating or making decisions? N  Walking or climbing stairs? N  Dressing or bathing? N  Doing errands, shopping? N  Preparing Food and eating ? N  Using the Toilet? N  In the past six months, have you accidently leaked urine? N  Do you have problems with loss of bowel control? N  Managing your Medications? N  Managing your Finances? N  Housekeeping or managing your Housekeeping? N  Some recent data might be hidden     Immunizations and Health Maintenance Immunization History  Administered Date(s) Administered  . DTaP 12/01/2013  . Influenza Inj Mdck Quad Pf 11/03/2017  . Influenza,inj,Quad PF,6+ Mos 06/29/2015, 05/16/2018  . Influenza-Unspecified 09/11/2019  . Pneumococcal Polysaccharide-23 04/13/2015  . Zoster Recombinat (Shingrix) 05/21/2018, 09/12/2018   Health Maintenance Due  Topic Date Due  . HIV Screening  04/09/1970  . PAP SMEAR-Modifier  04/09/1976  . COLONOSCOPY  08/16/2018  . FOOT EXAM  10/27/2018    Patient Care Team: Pleas Koch, NP as PCP - General (Nurse Practitioner)  Indicate any recent Medical Services you may have received from other than [facility] providers in the past year (date may be approximate).     Assessment:   This is a routine wellness examination for Danielle Edwards.  Hearing/Vision screen  Hearing Screening   125Hz  250Hz  500Hz  1000Hz  2000Hz  3000Hz  4000Hz  6000Hz  8000Hz   Right ear:           Left ear:           Vision Screening Comments: Patient gets annual eye exams   Dietary issues and exercise activities discussed: Current Exercise Habits: The patient does not participate in regular exercise at present, Exercise limited by: None identified  Goals    . Patient Stated     09/12/2019, I will try to eat a healthier diet and decrease my carb intake.       Depression Screen PHQ 2/9 Scores 09/12/2019  PHQ - 2 Score 0  PHQ- 9 Score 0    Fall Risk Fall Risk  09/12/2019  Falls in the past year? 0  Number falls in past yr: 0  Injury with Fall? 0  Risk for fall due to : Medication side effect  Follow up Falls evaluation completed;Falls prevention discussed    Is the patient's home free of loose throw rugs in walkways, pet beds, electrical cords, etc?   yes      Grab bars in the bathroom? yes      Handrails on the stairs?   yes      Adequate lighting?   yes  Timed Get Up and Go Performed N/A  Cognitive Function: MMSE - Mini Mental State Exam 09/12/2019  Orientation to time 5  Orientation to Place 5  Registration 3  Attention/ Calculation 5  Recall 3  Language- repeat 1       Mini Cog  Mini-Cog screen was completed. Maximum score is 22. A value of 0 denotes this part of the MMSE was not completed or the patient failed this part of the Mini-Cog screening.  Screening Tests Health Maintenance  Topic Date Due  . HIV Screening  04/09/1970  . PAP SMEAR-Modifier  04/09/1976  .  COLONOSCOPY  08/16/2018  . FOOT EXAM  10/27/2018  . TETANUS/TDAP  09/11/2020 (Originally 04/09/1974)  .  OPHTHALMOLOGY EXAM  11/04/2019  . HEMOGLOBIN A1C  02/23/2020  . MAMMOGRAM  04/30/2021  . INFLUENZA VACCINE  Completed  . Hepatitis C Screening  Completed    Qualifies for Shingles Vaccine? Completed series  Cancer Screenings: Lung: Low Dose CT Chest recommended if Age 31-80 years, 30 pack-year currently smoking OR have quit w/in 15years. Patient does not qualify. Breast: Up to date on Mammogram? Yes, completed 05/01/2019   Up to date of Bone Density/Dexa? Yes, completed 06/22/2015 Colorectal: Patient will call and schedule in January.   Additional Screenings:  Hepatitis C Screening: 08/05/2016     Plan:   Patient wants to eat healthier and decrease carb intake.  I have personally reviewed and noted the following in the patient's chart:   . Medical and social history . Use of alcohol, tobacco or illicit drugs  . Current medications and supplements . Functional ability and status . Nutritional status . Physical activity . Advanced directives . List of other physicians . Hospitalizations, surgeries, and ER visits in previous 12 months . Vitals . Screenings to include cognitive, depression, and falls . Referrals and appointments  In addition, I have reviewed and discussed with patient certain preventive protocols, quality metrics, and best practice recommendations. A written personalized care plan for preventive services as well as general preventive health recommendations were provided to patient.     Andrez Grime, LPN   [id number]

## 2019-09-16 ENCOUNTER — Other Ambulatory Visit: Payer: Self-pay | Admitting: Primary Care

## 2019-09-16 DIAGNOSIS — I10 Essential (primary) hypertension: Secondary | ICD-10-CM

## 2019-09-17 ENCOUNTER — Other Ambulatory Visit: Payer: Self-pay

## 2019-09-17 ENCOUNTER — Ambulatory Visit (INDEPENDENT_AMBULATORY_CARE_PROVIDER_SITE_OTHER): Payer: Medicare HMO | Admitting: Primary Care

## 2019-09-17 ENCOUNTER — Ambulatory Visit (INDEPENDENT_AMBULATORY_CARE_PROVIDER_SITE_OTHER)
Admission: RE | Admit: 2019-09-17 | Discharge: 2019-09-17 | Disposition: A | Discharge status: Home / Self Care | Payer: Medicare HMO | Source: Ambulatory Visit | Attending: Primary Care | Admitting: Primary Care

## 2019-09-17 VITALS — BP 140/82 | HR 85 | Temp 97.4°F | Ht 67.0 in | Wt 279.2 lb

## 2019-09-17 DIAGNOSIS — R5383 Other fatigue: Secondary | ICD-10-CM | POA: Insufficient documentation

## 2019-09-17 DIAGNOSIS — D649 Anemia, unspecified: Secondary | ICD-10-CM

## 2019-09-17 DIAGNOSIS — M25511 Pain in right shoulder: Secondary | ICD-10-CM | POA: Diagnosis not present

## 2019-09-17 DIAGNOSIS — K219 Gastro-esophageal reflux disease without esophagitis: Secondary | ICD-10-CM

## 2019-09-17 DIAGNOSIS — G894 Chronic pain syndrome: Secondary | ICD-10-CM | POA: Diagnosis not present

## 2019-09-17 DIAGNOSIS — E1122 Type 2 diabetes mellitus with diabetic chronic kidney disease: Secondary | ICD-10-CM | POA: Diagnosis not present

## 2019-09-17 DIAGNOSIS — N183 Chronic kidney disease, stage 3 unspecified: Secondary | ICD-10-CM | POA: Diagnosis not present

## 2019-09-17 DIAGNOSIS — Z0000 Encounter for general adult medical examination without abnormal findings: Secondary | ICD-10-CM

## 2019-09-17 DIAGNOSIS — Z794 Long term (current) use of insulin: Secondary | ICD-10-CM | POA: Diagnosis not present

## 2019-09-17 DIAGNOSIS — E785 Hyperlipidemia, unspecified: Secondary | ICD-10-CM | POA: Diagnosis not present

## 2019-09-17 DIAGNOSIS — Z86010 Personal history of colonic polyps: Secondary | ICD-10-CM

## 2019-09-17 DIAGNOSIS — F32A Depression, unspecified: Secondary | ICD-10-CM

## 2019-09-17 DIAGNOSIS — M19011 Primary osteoarthritis, right shoulder: Secondary | ICD-10-CM | POA: Diagnosis not present

## 2019-09-17 DIAGNOSIS — Z0120 Encounter for dental examination and cleaning without abnormal findings: Secondary | ICD-10-CM

## 2019-09-17 DIAGNOSIS — E1169 Type 2 diabetes mellitus with other specified complication: Secondary | ICD-10-CM | POA: Diagnosis not present

## 2019-09-17 DIAGNOSIS — I10 Essential (primary) hypertension: Secondary | ICD-10-CM

## 2019-09-17 DIAGNOSIS — E039 Hypothyroidism, unspecified: Secondary | ICD-10-CM | POA: Diagnosis not present

## 2019-09-17 DIAGNOSIS — F329 Major depressive disorder, single episode, unspecified: Secondary | ICD-10-CM

## 2019-09-17 DIAGNOSIS — F419 Anxiety disorder, unspecified: Secondary | ICD-10-CM

## 2019-09-17 DIAGNOSIS — F411 Generalized anxiety disorder: Secondary | ICD-10-CM | POA: Diagnosis not present

## 2019-09-17 DIAGNOSIS — Z0001 Encounter for general adult medical examination with abnormal findings: Secondary | ICD-10-CM

## 2019-09-17 HISTORY — DX: Other fatigue: R53.83

## 2019-09-17 MED ORDER — BLOOD GLUCOSE MONITOR KIT: PACK | 0 refills | Status: DC

## 2019-09-17 NOTE — Assessment & Plan Note (Addendum)
Following with pain management now and is taking PRN promethazine and oxycodone. She plans to follow up to discuss switching to Tramadol.

## 2019-09-17 NOTE — Assessment & Plan Note (Signed)
Due, she will call GI to schedule follow-up. She admits that she was delayed this year due to COVID-19.

## 2019-09-17 NOTE — Assessment & Plan Note (Signed)
Chronic and likely multifactorial.  Referral placed to pulmonology for sleep study as I suspect sleep apnea to be playing a larger role.  I also recommended regular activity and healthy diet for weight loss.  We will gain better control of diabetes and blood pressure.  Anemia likely secondary to CKD.

## 2019-09-17 NOTE — Assessment & Plan Note (Signed)
A1c from care everywhere at 7.9.  This is an overall improvement but still above goal given her history of CKD and hypertension and hyperlipidemia.  Goal A1c for this patient is less than 7.  I stressed the importance of blood sugar monitoring while on Lantus insulin, discussed negative effects of not checking glucose when administering insulin.  She verbalized understanding.  Glucometer kit faxed to pharmacy. Increase Lantus to 30 units given recent A1c result.  We will see her back in 4 weeks with her glucose logs.

## 2019-09-17 NOTE — Assessment & Plan Note (Signed)
Stopped taking levothyroxine months ago as she thought her thyroid disorder had resolved.  Repeat TSH pending.

## 2019-09-17 NOTE — Patient Instructions (Signed)
Complete xray(s) and lab prior to leaving today. I will notify you of your results once received.  Resume venlafaxine Er 37.5 mg for depression and anxiety.  Resume your atorvastatin cholesterol medication.  Increase your insulin to 30 units once nightly. Continue glimepiride and metformin.  Start checking your blood sugar levels.  Appropriate times to check your blood sugar levels are:  -Before any meal (breakfast, lunch, dinner) -Two hours after any meal (breakfast, lunch, dinner) -Bedtime  Record your readings and notify me if you continue to consistently run at or above 200   Schedule your colonoscopy.  Start taking ferrous sulfate 325 mg (Iron 65 mg) once daily for blood counts.  Schedule a follow up visit in 4 weeks for diabetes check. Bring your blood sugar logs.  It was a pleasure to see you today!   Preventive Care 76-40 Years Old, Female Preventive care refers to visits with your health care provider and lifestyle choices that can promote health and wellness. This includes:  A yearly physical exam. This may also be called an annual well check.  Regular dental visits and eye exams.  Immunizations.  Screening for certain conditions.  Healthy lifestyle choices, such as eating a healthy diet, getting regular exercise, not using drugs or products that contain nicotine and tobacco, and limiting alcohol use. What can I expect for my preventive care visit? Physical exam Your health care provider will check your:  Height and weight. This may be used to calculate body mass index (BMI), which tells if you are at a healthy weight.  Heart rate and blood pressure.  Skin for abnormal spots. Counseling Your health care provider may ask you questions about your:  Alcohol, tobacco, and drug use.  Emotional well-being.  Home and relationship well-being.  Sexual activity.  Eating habits.  Work and work Statistician.  Method of birth control.  Menstrual  cycle.  Pregnancy history. What immunizations do I need?  Influenza (flu) vaccine  This is recommended every year. Tetanus, diphtheria, and pertussis (Tdap) vaccine  You may need a Td booster every 10 years. Varicella (chickenpox) vaccine  You may need this if you have not been vaccinated. Zoster (shingles) vaccine  You may need this after age 50. Measles, mumps, and rubella (MMR) vaccine  You may need at least one dose of MMR if you were born in 1957 or later. You may also need a second dose. Pneumococcal conjugate (PCV13) vaccine  You may need this if you have certain conditions and were not previously vaccinated. Pneumococcal polysaccharide (PPSV23) vaccine  You may need one or two doses if you smoke cigarettes or if you have certain conditions. Meningococcal conjugate (MenACWY) vaccine  You may need this if you have certain conditions. Hepatitis A vaccine  You may need this if you have certain conditions or if you travel or work in places where you may be exposed to hepatitis A. Hepatitis B vaccine  You may need this if you have certain conditions or if you travel or work in places where you may be exposed to hepatitis B. Haemophilus influenzae type b (Hib) vaccine  You may need this if you have certain conditions. Human papillomavirus (HPV) vaccine  If recommended by your health care provider, you may need three doses over 6 months. You may receive vaccines as individual doses or as more than one vaccine together in one shot (combination vaccines). Talk with your health care provider about the risks and benefits of combination vaccines. What tests do I need?  Blood tests  Lipid and cholesterol levels. These may be checked every 5 years, or more frequently if you are over 19 years old.  Hepatitis C test.  Hepatitis B test. Screening  Lung cancer screening. You may have this screening every year starting at age 48 if you have a 30-pack-year history of smoking and  currently smoke or have quit within the past 15 years.  Colorectal cancer screening. All adults should have this screening starting at age 75 and continuing until age 73. Your health care provider may recommend screening at age 30 if you are at increased risk. You will have tests every 1-10 years, depending on your results and the type of screening test.  Diabetes screening. This is done by checking your blood sugar (glucose) after you have not eaten for a while (fasting). You may have this done every 1-3 years.  Mammogram. This may be done every 1-2 years. Talk with your health care provider about when you should start having regular mammograms. This may depend on whether you have a family history of breast cancer.  BRCA-related cancer screening. This may be done if you have a family history of breast, ovarian, tubal, or peritoneal cancers.  Pelvic exam and Pap test. This may be done every 3 years starting at age 30. Starting at age 64, this may be done every 5 years if you have a Pap test in combination with an HPV test. Other tests  Sexually transmitted disease (STD) testing.  Bone density scan. This is done to screen for osteoporosis. You may have this scan if you are at high risk for osteoporosis. Follow these instructions at home: Eating and drinking  Eat a diet that includes fresh fruits and vegetables, whole grains, lean protein, and low-fat dairy.  Take vitamin and mineral supplements as recommended by your health care provider.  Do not drink alcohol if: ? Your health care provider tells you not to drink. ? You are pregnant, may be pregnant, or are planning to become pregnant.  If you drink alcohol: ? Limit how much you have to 0-1 drink a day. ? Be aware of how much alcohol is in your drink. In the [place] one drink equals one 12 oz bottle of beer (355 mL), one 5 oz glass of wine (148 mL), or one 1 oz glass of hard liquor (44 mL). Lifestyle  Take daily care of your teeth and  gums.  Stay active. Exercise for at least 30 minutes on 5 or more days each week.  Do not use any products that contain nicotine or tobacco, such as cigarettes, e-cigarettes, and chewing tobacco. If you need help quitting, ask your health care provider.  If you are sexually active, practice safe sex. Use a condom or other form of birth control (contraception) in order to prevent pregnancy and STIs (sexually transmitted infections).  If told by your health care provider, take low-dose aspirin daily starting at age 8. What's next?  Visit your health care provider once a year for a well check visit.  Ask your health care provider how often you should have your eyes and teeth checked.  Stay up to date on all vaccines. This information is not intended to replace advice given to you by your health care provider. Make sure you discuss any questions you have with your health care provider. Document Released: 10/16/2015 Document Revised: 05/31/2018 Document Reviewed: 05/31/2018 Elsevier Patient Education  2020 [employer].

## 2019-09-17 NOTE — Assessment & Plan Note (Signed)
She stopped venlafaxine months ago. Discussed to resume given her preference and also for ongoing depression.  Follow-up in 1 month.

## 2019-09-17 NOTE — Progress Notes (Signed)
Subjective:    Patient ID: Danielle Edwards, female    DOB: [birth year], 64 y.o.   MRN: [id number]  HPI  Danielle Edwards is a 63 year old female who presents today for complete physical.  This visit occurred during the SARS-CoV-2 public health emergency.  Safety protocols were in place, including screening questions prior to the visit, additional usage of staff PPE, and extensive cleaning of exam room while observing appropriate contact time as indicated for disinfecting solutions.     She would like to discuss acute right shoulder/upper extremity pain with intermittent radiation to her right elbow that began two weeks ago. She denies injury/trauma, isn't sure if this was caused when carrying heavy groceries. She's taken a few doses of Aleve with temporary improvement. Last night she purchased lidocaine patches but hasn't used it yet. She does have an orthopedist but hasn't set up an appointment.   After chart review through care everywhere she recently saw her nephrologist who recommended sleep study.  Patient endorses a history of sleep apnea, has not had CPAP machine ever.  She sleeps in a recliner for the most part, did sleep in her bed onto her right side just prior to her right shoulder symptoms. She denies numbness/tingling.  She has had no recent sleep study.  Endorses chronic fatigue, daytime drowsiness, snoring.  Immunizations: -Tetanus: Completed in 2015 -Influenza: Completed this season -Shingles: Completed Shingrix -Pneumonia: Completed last in 2016  Diet: She endorses a better diet. Exercise: She is not exercising  Eye exam: Completed in 2020, due in early 2021 Dental exam: No recent exam, plans on scheduling   Mammogram: Completed in July 2020 Colonoscopy: Completed in 2016, due now and plans on getting this scheduled after the new year. Hep C Screen: Negative  BP Readings from Last 3 Encounters:  09/17/19 140/82  12/11/18 (!) 148/82  02/06/18 136/76    She is not  checking her blood pressure at home. She was evaluated by her nephrologist in late November 2020, BP was 138/74.  Per care everywhere notes her nephrologist believes that her blood pressure is under good control on her current regimen.  She is not checking blood sugars, last check was in October 2020.  She is compliant to Lantus 25 units daily.  Also compliant to metformin and glimepiride.  She stopped taking her levothyroxine months ago.   She stopped taking her venlafaxine months ago, initial improvement but then she felt as though the medication wasn't as effective.  She would like to resume.  She stopped taking her atorvastatin medication 6+ months ago as she read some potentially bad side effects on Facebook.  She denies any side effects when taking atorvastatin.  Review of Systems  Constitutional: Positive for fatigue. Negative for unexpected weight change.  HENT: Negative for rhinorrhea.   Respiratory: Negative for cough and shortness of breath.        Waking up with SOB, sleeps in recliner, fatigue   Cardiovascular: Negative for chest pain.  Gastrointestinal: Negative for constipation.       Waxing and waning diarrhea, history of IBS.  Genitourinary: Negative for difficulty urinating.  Musculoskeletal: Positive for arthralgias.       Acute right shoulder pain  Skin: Negative for rash.  Allergic/Immunologic: Negative for environmental allergies.  Neurological: Negative for dizziness, numbness and headaches.  Psychiatric/Behavioral:       Chronic depression       Past Medical History:  Diagnosis Date  . Anemia    Iron defiency   .  Chronic pain   . Diabetes type 2, controlled ([place])   . Headache   . History of colonic polyps 1999   Benign  . Hx of adenomatous polyp of colon 09/27/2000   2001 - diminutive adenoma Danielle Edwards 2007 no polyps - Danielle Edwards  . Hypertension   . Hypothyroidism   . IBS (irritable bowel syndrome)    Diarrhea type  . Vitamin D deficiency        Social History   Socioeconomic History  . Marital status: Married    Spouse name: Not on file  . Number of children: Not on file  . Years of education: Not on file  . Highest education level: Not on file  Occupational History  . Not on file  Tobacco Use  . Smoking status: Never Smoker  . Smokeless tobacco: Never Used  Substance and Sexual Activity  . Alcohol use: Yes    Alcohol/week: 0.0 standard drinks    Comment: rarely  . Drug use: No  . Sexual activity: Not on file  Other Topics Concern  . Not on file  Social History Narrative   Married.   3 children.   Retired, worked at [employer] in [place], [place].   Enjoys being with her grandchildren, being with her friends.   Social Determinants of Health   Financial Resource Strain:   . Difficulty of Paying Living Expenses: Not on file  Food Insecurity:   . Worried About Charity fundraiser in the Last Year: Not on file  . Ran Out of Food in the Last Year: Not on file  Transportation Needs:   . Lack of Transportation (Medical): Not on file  . Lack of Transportation (Non-Medical): Not on file  Physical Activity:   . Days of Exercise per Week: Not on file  . Minutes of Exercise per Session: Not on file  Stress:   . Feeling of Stress : Not on file  Social Connections:   . Frequency of Communication with Friends and Family: Not on file  . Frequency of Social Gatherings with Friends and Family: Not on file  . Attends Religious Services: Not on file  . Active Member of Clubs or Organizations: Not on file  . Attends Archivist Meetings: Not on file  . Marital Status: Not on file  Intimate Partner Violence:   . Fear of Current or Ex-Partner: Not on file  . Emotionally Abused: Not on file  . Physically Abused: Not on file  . Sexually Abused: Not on file    Past Surgical History:  Procedure Laterality Date  . CHOLECYSTECTOMY  1995  . COLONOSCOPY  multiple  . NASAL SINUS SURGERY  2001  . TONSILLECTOMY AND  ADENOIDECTOMY    . VAGINAL HYSTERECTOMY     Complete    Family History  Problem Relation Age of Onset  . Colon cancer Mother   . Lung cancer Mother   . Skin cancer Mother   . Heart disease Father   . Diabetes Father   . Skin cancer Father   . Breast cancer Neg Hx     No Known Allergies  Current Outpatient Medications on File Prior to Visit  Medication Sig Dispense Refill  . ACCU-CHEK FASTCLIX LANCETS MISC USE AS INSTRUCTED TO CHECK BLOOD SUGAR UP TO 3 TIMES DAILY 306 each 1  . amLODipine (NORVASC) 10 MG tablet Take 1 tablet (10 mg total) by mouth daily. For blood pressure. 90 tablet 3  . atorvastatin (LIPITOR) 40 MG tablet Take 1 tablet (  40 mg total) by mouth at bedtime. For cholesterol. 90 tablet 3  . Blood Glucose Monitoring Suppl (ACCU-CHEK AVIVA PLUS) w/Device KIT Use as instructed to test blood sugar 3 times daily 1 kit 0  . glimepiride (AMARYL) 4 MG tablet TAKE 2 TABLETS BY MOUTH ONCE DAILY WITH BREAKFAST FOR DIABETES 180 tablet 3  . glucose blood (ACCU-CHEK AVIVA) test strip Use as instructed to test blood sugar up to 3 times daily 300 each 1  . Insulin Glargine (LANTUS SOLOSTAR) 100 UNIT/ML Solostar Pen Inject 25 Units into the skin daily. 15 mL 5  . Insulin Pen Needle 32G X 6 MM MISC Use as instructed to inject insulin daily at bedtime 100 each 3  . levothyroxine (SYNTHROID) 25 MCG tablet TAKE 1 TAB BY MOUTH ONCE DAILY. TAKE ON AN EMPTY STOMACH WITH A GLASSOF WATER ATLEAST 30-60 MINUTES BEFORE BREAKFAST (Patient not taking: Reported on 09/12/2019) 90 tablet 0  . loperamide (IMODIUM A-D) 2 MG tablet Take 2 mg by mouth 4 (four) times daily as needed for diarrhea or loose stools.    Marland Kitchen losartan (COZAAR) 100 MG tablet TAKE 1 TABLET BY MOUTH ONCE A DAY 90 tablet 0  . metFORMIN (GLUCOPHAGE) 1000 MG tablet Take 1 tablet (1,000 mg total) by mouth 2 (two) times daily with a meal. For diabetes. 180 tablet 3  . oxyCODONE (OXY IR/ROXICODONE) 5 MG immediate release tablet Take 5 mg by  mouth 4 (four) times daily as needed.    . pantoprazole (PROTONIX) 20 MG tablet Take 1 tablet (20 mg total) by mouth daily. 90 tablet 0  . promethazine (PHENERGAN) 25 MG tablet Take 25 mg by mouth every 6 (six) hours as needed. Reported on 09/21/2015  1  . venlafaxine XR (EFFEXOR XR) 37.5 MG 24 hr capsule Take 1 capsule (37.5 mg total) by mouth daily with breakfast. For anxiety and depression. 90 capsule 1   No current facility-administered medications on file prior to visit.    BP 140/82   Pulse 85   Temp (!) 97.4 F (36.3 C) (Temporal)   Ht 5' 7"  (1.702 m)   Wt 279 lb 4 oz (126.7 kg)   SpO2 97%   BMI 43.74 kg/m    Objective:   Physical Exam  Constitutional: She is oriented to person, place, and time. She appears well-nourished.  HENT:  Right Ear: Tympanic membrane and ear canal normal.  Left Ear: Tympanic membrane and ear canal normal.  Mouth/Throat: Oropharynx is clear and moist.  Eyes: Pupils are equal, round, and reactive to light. EOM are normal.  Cardiovascular: Normal rate and regular rhythm.  Respiratory: Effort normal and breath sounds normal.  GI: Soft. Bowel sounds are normal. There is no abdominal tenderness.  Musculoskeletal:     Right shoulder: Pain present. No swelling or tenderness. Decreased range of motion.       Arms:     Cervical back: Neck supple.     Comments: Strength 4 out of 5 to right upper extremity due to discomfort.  Neurological: She is alert and oriented to person, place, and time. No cranial nerve deficit.  Reflex Scores:      Patellar reflexes are 2+ on the right side and 2+ on the left side. Skin: Skin is warm and dry.  Psychiatric: She has a normal mood and affect.           Assessment & Plan:  >45 minutes spent face to face with patient, >50% spent counseling or coordinating care.

## 2019-09-17 NOTE — Assessment & Plan Note (Signed)
Following with nephrology, recent note and renal labs reviewed.  Continue losartan for renal protection. We will also work on better blood pressure and diabetes control.

## 2019-09-17 NOTE — Assessment & Plan Note (Signed)
Overall doing well, using pantoprazole sparingly.

## 2019-09-17 NOTE — Assessment & Plan Note (Signed)
Chronic anemia that is likely secondary to CKD, noncompliant to oral iron.  She will undergo colonoscopy early 2021.  Discussed to start ferrous sulfate 325 mg daily, we will continue to monitor anemia.

## 2019-09-17 NOTE — Assessment & Plan Note (Signed)
Immunizations up-to-date. Mammogram up-to-date. Bone density scan due at 64. Colonoscopy due, she will call GI to schedule an appointment. Discussed the importance of a healthy diet and regular exercise in order for weight loss, and to reduce the risk of any potential medical problems.  Exam today as noted. Labs reviewed and also pending.

## 2019-09-17 NOTE — Assessment & Plan Note (Addendum)
Blood pressure appears above goal in the office today.  Recent nephrology visit stated blood pressure was under good control with a reading of 138/74.''  I recommended she start monitoring her blood pressure at home, she will purchase a wrist cuff as her body habitus will not support an arm cuff.  I notified patient to notify me if blood pressure readings consistently run at or above 135/90.  She verbalized understanding.  Continue current regimen for now.  Follow-up in 1 month.

## 2019-09-18 ENCOUNTER — Other Ambulatory Visit: Payer: Self-pay | Admitting: Primary Care

## 2019-09-18 LAB — LIPID PANEL
Cholesterol: 211 mg/dL — ABNORMAL HIGH (ref 0–200)
HDL: 48.9 mg/dL (ref >39.00)
LDL Cholesterol: 123 mg/dL — ABNORMAL HIGH (ref 0–99)
NonHDL: 162.16
Total CHOL/HDL Ratio: 4
Triglycerides: 194 mg/dL — ABNORMAL HIGH (ref 0.0–149.0)
VLDL: 38.8 mg/dL (ref 0.0–40.0)

## 2019-09-18 LAB — TSH: TSH: 3.05 u[IU]/mL (ref 0.35–4.50)

## 2019-09-18 LAB — VITAMIN B12: Vitamin B-12: 444 pg/mL (ref 211–911)

## 2019-09-24 ENCOUNTER — Institutional Professional Consult (permissible substitution): Payer: Medicare HMO | Admitting: Pulmonary Disease

## 2019-10-18 ENCOUNTER — Ambulatory Visit (INDEPENDENT_AMBULATORY_CARE_PROVIDER_SITE_OTHER): Payer: Medicare HMO | Admitting: Primary Care

## 2019-10-18 ENCOUNTER — Encounter: Payer: Self-pay | Admitting: Primary Care

## 2019-10-18 ENCOUNTER — Other Ambulatory Visit: Payer: Self-pay

## 2019-10-18 DIAGNOSIS — D649 Anemia, unspecified: Secondary | ICD-10-CM

## 2019-10-18 DIAGNOSIS — F419 Anxiety disorder, unspecified: Secondary | ICD-10-CM

## 2019-10-18 DIAGNOSIS — E119 Type 2 diabetes mellitus without complications: Secondary | ICD-10-CM | POA: Diagnosis not present

## 2019-10-18 DIAGNOSIS — Z8659 Personal history of other mental and behavioral disorders: Secondary | ICD-10-CM

## 2019-10-18 DIAGNOSIS — F32A Depression, unspecified: Secondary | ICD-10-CM

## 2019-10-18 DIAGNOSIS — F329 Major depressive disorder, single episode, unspecified: Secondary | ICD-10-CM

## 2019-10-18 MED ORDER — FREESTYLE LIBRE 14 DAY SENSOR MISC: 1.0000 | Freq: Three times a day (TID) | 5 refills | Status: DC

## 2019-10-18 MED ORDER — FREESTYLE LIBRE 14 DAY READER DEVI: 1.0000 | Freq: Three times a day (TID) | 0 refills | Status: DC

## 2019-10-18 NOTE — Assessment & Plan Note (Signed)
Prior diagnosis years ago, symptoms have returned and increased.   Labs completed recently including TSH, B12, CBC, labs in care everywhere without obvious cause.   Referral placed to psychiatry for further evaluation.

## 2019-10-18 NOTE — Assessment & Plan Note (Addendum)
Patient continues to refrain from checking glucose readings daily despite strong recommendations and long discussion we had last visit.  I discussed with her today that I cannot make any changes to her diabetes regimen until we have regular glucose readings.   Rx for [employer] device and sensor sent to pharmacy to make this process easier.   Continue Lantus 30 units.  She will look for glimepiride but she may no longer need.  Follow up in late February for repeat A1C and follow up.

## 2019-10-18 NOTE — Progress Notes (Signed)
Subjective:    Patient ID: Danielle Edwards, female    DOB: 08-05-1956, 64 y.o.   MRN: [id number]  HPI  This visit occurred during the SARS-CoV-2 public health emergency.  Safety protocols were in place, including screening questions prior to the visit, additional usage of staff PPE, and extensive cleaning of exam room while observing appropriate contact time as indicated for disinfecting solutions.   Ms. Raupp is a 64 year old female with a history of hypertension, type 2 diabetes, hypothyroidism, CKD, anxiety and depression, hyperlipidemia who presents today for follow up of diabetes. She would also like to discuss ADHD.  1) Type 2 Diabetes:  She was last evaluated on 09/17/19 for annual physical, admitted that she was injecting insulin but not checking blood glucose levels regularly. A1C was 7.9 through [place] which is above goal given medical history. During her last visit we decided to increase Lantus to 30 units, have her start monitoring glucose levels daily, follow up one month later.  Since her last visit she's checking her blood sugars 2-3 times weekly in the mornings fasting which are ranging 120-140. She's injecting 30 units of Lantus daily. She lost her glimepiride prescription two weeks ago, she will start looking.   2) ADD: Prior history of ADD and was once managed on Strattera for 8-10 years but has not taken for eight years. Symptoms include losing things, feeling "scattered brained". She's been told by family that "my ADD has returned". She also endorses losing her phone, keys, medications, she forgets things. She denies forgetting people, how to get home, loss of short term memory.   She's under a lot of stress, feeling anxious about an upcoming dental appointment. She is interested in seeing psychiatry.   Review of Systems  Eyes: Negative for visual disturbance.  Respiratory: Negative for shortness of breath.   Cardiovascular: Negative for chest pain.    Psychiatric/Behavioral: Positive for decreased concentration. The patient is nervous/anxious.        See HPI       Past Medical History:  Diagnosis Date  . Anemia    Iron defiency   . Chronic pain   . Diabetes type 2, controlled ([place])   . Headache   . History of colonic polyps 1999   Benign  . Hx of adenomatous polyp of colon 09/27/2000   2001 - diminutive adenoma [place] 2007 no polyps - [place]  . Hypertension   . Hypothyroidism   . IBS (irritable bowel syndrome)    Diarrhea type  . Vitamin D deficiency      Social History   Socioeconomic History  . Marital status: Married    Spouse name: Not on file  . Number of children: Not on file  . Years of education: Not on file  . Highest education level: Not on file  Occupational History  . Not on file  Tobacco Use  . Smoking status: Never Smoker  . Smokeless tobacco: Never Used  Substance and Sexual Activity  . Alcohol use: Yes    Alcohol/week: 0.0 standard drinks    Comment: rarely  . Drug use: No  . Sexual activity: Not on file  Other Topics Concern  . Not on file  Social History Narrative   Married.   3 children.   Retired, worked at [employer] in [place], [place].   Enjoys being with her grandchildren, being with her friends.   Social Determinants of Health   Financial Resource Strain:   . Difficulty of Paying Living Expenses:  Not on file  Food Insecurity:   . Worried About Charity fundraiser in the Last Year: Not on file  . Ran Out of Food in the Last Year: Not on file  Transportation Needs:   . Lack of Transportation (Medical): Not on file  . Lack of Transportation (Non-Medical): Not on file  Physical Activity:   . Days of Exercise per Week: Not on file  . Minutes of Exercise per Session: Not on file  Stress:   . Feeling of Stress : Not on file  Social Connections:   . Frequency of Communication with Friends and Family: Not on file  . Frequency of Social Gatherings with Friends and Family: Not on  file  . Attends Religious Services: Not on file  . Active Member of Clubs or Organizations: Not on file  . Attends Archivist Meetings: Not on file  . Marital Status: Not on file  Intimate Partner Violence:   . Fear of Current or Ex-Partner: Not on file  . Emotionally Abused: Not on file  . Physically Abused: Not on file  . Sexually Abused: Not on file    Past Surgical History:  Procedure Laterality Date  . CHOLECYSTECTOMY  1995  . COLONOSCOPY  multiple  . NASAL SINUS SURGERY  2001  . TONSILLECTOMY AND ADENOIDECTOMY    . VAGINAL HYSTERECTOMY     Complete    Family History  Problem Relation Age of Onset  . Colon cancer Mother   . Lung cancer Mother   . Skin cancer Mother   . Heart disease Father   . Diabetes Father   . Skin cancer Father   . Breast cancer Neg Hx     No Known Allergies  Current Outpatient Medications on File Prior to Visit  Medication Sig Dispense Refill  . ACCU-CHEK FASTCLIX LANCETS MISC USE AS INSTRUCTED TO CHECK BLOOD SUGAR UP TO 3 TIMES DAILY 306 each 1  . amLODipine (NORVASC) 10 MG tablet Take 1 tablet (10 mg total) by mouth daily. For blood pressure. 90 tablet 3  . atorvastatin (LIPITOR) 40 MG tablet Take 1 tablet (40 mg total) by mouth at bedtime. For cholesterol. 90 tablet 3  . blood glucose meter kit and supplies KIT Dispense based on patient and insurance preference. Use up to four times daily as directed. (FOR ICD-9 250.00, 250.01). 1 each 0  . Blood Glucose Monitoring Suppl (ACCU-CHEK AVIVA PLUS) w/Device KIT Use as instructed to test blood sugar 3 times daily 1 kit 0  . glimepiride (AMARYL) 4 MG tablet TAKE 2 TABLETS BY MOUTH ONCE DAILY WITH BREAKFAST FOR DIABETES 180 tablet 3  . glucose blood (ACCU-CHEK AVIVA) test strip Use as instructed to test blood sugar up to 3 times daily 300 each 1  . Insulin Glargine (LANTUS SOLOSTAR) 100 UNIT/ML Solostar Pen Inject 25 Units into the skin daily. 15 mL 5  . Insulin Pen Needle 32G X 6 MM MISC  Use as instructed to inject insulin daily at bedtime 100 each 3  . loperamide (IMODIUM A-D) 2 MG tablet Take 2 mg by mouth 4 (four) times daily as needed for diarrhea or loose stools.    Marland Kitchen losartan (COZAAR) 100 MG tablet TAKE 1 TABLET BY MOUTH ONCE A DAY 90 tablet 0  . metFORMIN (GLUCOPHAGE) 1000 MG tablet Take 1 tablet (1,000 mg total) by mouth 2 (two) times daily with a meal. For diabetes. 180 tablet 3  . oxyCODONE (OXY IR/ROXICODONE) 5 MG immediate release tablet Take  5 mg by mouth 4 (four) times daily as needed.    . pantoprazole (PROTONIX) 20 MG tablet Take 1 tablet (20 mg total) by mouth daily. 90 tablet 0  . promethazine (PHENERGAN) 25 MG tablet Take 25 mg by mouth every 6 (six) hours as needed. Reported on 09/21/2015  1  . venlafaxine XR (EFFEXOR XR) 37.5 MG 24 hr capsule Take 1 capsule (37.5 mg total) by mouth daily with breakfast. For anxiety and depression. 90 capsule 1   No current facility-administered medications on file prior to visit.    BP 132/76   Pulse 97   Temp (!) 96.2 F (35.7 C) (Temporal)   Resp 16   Ht 5' 6"  (1.676 m)   Wt 274 lb (124.3 kg)   SpO2 98%   BMI 44.22 kg/m    Objective:   Physical Exam  Constitutional: She appears well-nourished.  Cardiovascular: Normal rate and regular rhythm.  Respiratory: Effort normal and breath sounds normal.  Musculoskeletal:     Cervical back: Neck supple.  Skin: Skin is warm and dry.  Psychiatric: She has a normal mood and affect.           Assessment & Plan:

## 2019-10-18 NOTE — Assessment & Plan Note (Signed)
Compliant to oral iron which is causing constipation. Discussed to take every other day. Repeat labs next visit.

## 2019-10-18 NOTE — Assessment & Plan Note (Signed)
Increased anxiety with forgetfulness and multiple medical conditions. Compliant to Effexor.  Labs including TSH and B12 unremarkable last visit. Referral placed to psychiatry.

## 2019-10-18 NOTE — Patient Instructions (Signed)
Pick up the [employer] and Device at [employer].  Start checking your blood sugar levels.  Appropriate times to check your blood sugar levels are:  -Before any meal (breakfast, lunch, dinner) -Two hours after any meal (breakfast, lunch, dinner) -Bedtime  Record your readings and notify me if you continue to consistently run at or above 200   Continue Lantus 30 units nightly.  You will be contacted regarding your referral to psychiatry.  Please let us know if you have not been contacted within two weeks.   Schedule a follow up visit for diabetes on or after November 26, 2019.  It was a pleasure to see you today!

## 2019-10-22 ENCOUNTER — Other Ambulatory Visit: Payer: Self-pay | Admitting: Primary Care

## 2019-11-13 DIAGNOSIS — M461 Sacroiliitis, not elsewhere classified: Secondary | ICD-10-CM | POA: Diagnosis not present

## 2019-11-13 DIAGNOSIS — M797 Fibromyalgia: Secondary | ICD-10-CM | POA: Diagnosis not present

## 2019-11-13 DIAGNOSIS — M7061 Trochanteric bursitis, right hip: Secondary | ICD-10-CM | POA: Diagnosis not present

## 2019-11-13 DIAGNOSIS — M7062 Trochanteric bursitis, left hip: Secondary | ICD-10-CM | POA: Diagnosis not present

## 2019-11-28 ENCOUNTER — Ambulatory Visit: Payer: Medicare HMO | Admitting: Primary Care

## 2019-12-07 ENCOUNTER — Other Ambulatory Visit: Payer: Self-pay | Admitting: Primary Care

## 2019-12-07 DIAGNOSIS — E119 Type 2 diabetes mellitus without complications: Secondary | ICD-10-CM

## 2019-12-10 NOTE — Telephone Encounter (Signed)
Message left for patient to return my call.  

## 2019-12-10 NOTE — Telephone Encounter (Signed)
Ok to refill? Last prescribed on 12/14/2018 #15 ml with 5 refills. Last appointment on 10/18/2019 . Patient cancel her appointment in Feb and did not reschedule as requested.

## 2019-12-10 NOTE — Telephone Encounter (Signed)
Find out why she cancelled and explain that we need to follow up. Also please verify how much Lantus she is injecting.

## 2019-12-16 NOTE — Telephone Encounter (Signed)
Tried to call patient on 12/13/2019 and 12/16/2019. Could not leave message since VM is full

## 2019-12-20 ENCOUNTER — Ambulatory Visit (INDEPENDENT_AMBULATORY_CARE_PROVIDER_SITE_OTHER): Payer: Medicare HMO | Admitting: Primary Care

## 2019-12-20 ENCOUNTER — Encounter: Payer: Self-pay | Admitting: Primary Care

## 2019-12-20 ENCOUNTER — Other Ambulatory Visit: Payer: Self-pay

## 2019-12-20 VITALS — BP 130/86 | HR 80 | Temp 97.4°F | Ht 66.0 in | Wt 274.0 lb

## 2019-12-20 DIAGNOSIS — D649 Anemia, unspecified: Secondary | ICD-10-CM

## 2019-12-20 DIAGNOSIS — F411 Generalized anxiety disorder: Secondary | ICD-10-CM

## 2019-12-20 DIAGNOSIS — E1169 Type 2 diabetes mellitus with other specified complication: Secondary | ICD-10-CM | POA: Diagnosis not present

## 2019-12-20 DIAGNOSIS — F419 Anxiety disorder, unspecified: Secondary | ICD-10-CM | POA: Diagnosis not present

## 2019-12-20 DIAGNOSIS — E119 Type 2 diabetes mellitus without complications: Secondary | ICD-10-CM

## 2019-12-20 DIAGNOSIS — E785 Hyperlipidemia, unspecified: Secondary | ICD-10-CM

## 2019-12-20 DIAGNOSIS — F329 Major depressive disorder, single episode, unspecified: Secondary | ICD-10-CM

## 2019-12-20 DIAGNOSIS — I10 Essential (primary) hypertension: Secondary | ICD-10-CM | POA: Diagnosis not present

## 2019-12-20 DIAGNOSIS — F32A Depression, unspecified: Secondary | ICD-10-CM

## 2019-12-20 LAB — IBC + FERRITIN
Ferritin: 11.5 ng/mL (ref 10.0–291.0)
Iron: 66 ug/dL (ref 42–145)
Saturation Ratios: 15.7 % — ABNORMAL LOW (ref 20.0–50.0)
Transferrin: 300 mg/dL (ref 212.0–360.0)

## 2019-12-20 LAB — POCT GLYCOSYLATED HEMOGLOBIN (HGB A1C): Hemoglobin A1C: 9.9 % — AB (ref 4.0–5.6)

## 2019-12-20 LAB — CBC
HCT: 34.1 % — ABNORMAL LOW (ref 36.0–46.0)
Hemoglobin: 11.1 g/dL — ABNORMAL LOW (ref 12.0–15.0)
MCHC: 32.6 g/dL (ref 30.0–36.0)
MCV: 86 fl (ref 78.0–100.0)
Platelets: 253 10*3/uL (ref 150.0–400.0)
RBC: 3.96 Mil/uL (ref 3.87–5.11)
RDW: 15.2 % (ref 11.5–15.5)
WBC: 7.3 10*3/uL (ref 4.0–10.5)

## 2019-12-20 LAB — BASIC METABOLIC PANEL
BUN: 27 mg/dL — ABNORMAL HIGH (ref 6–23)
CO2: 26 mEq/L (ref 19–32)
Calcium: 9.9 mg/dL (ref 8.4–10.5)
Chloride: 101 mEq/L (ref 96–112)
Creatinine, Ser: 1.4 mg/dL — ABNORMAL HIGH (ref 0.40–1.20)
GFR: 37.77 mL/min — ABNORMAL LOW (ref >60.00)
Glucose, Bld: 205 mg/dL — ABNORMAL HIGH (ref 70–99)
Potassium: 4 mEq/L (ref 3.5–5.1)
Sodium: 137 mEq/L (ref 135–145)

## 2019-12-20 MED ORDER — FREESTYLE LIBRE 14 DAY SENSOR MISC: 1.0000 | Freq: Three times a day (TID) | 5 refills | Status: DC

## 2019-12-20 MED ORDER — METFORMIN HCL 1000 MG PO TABS: 1000.0000 mg | ORAL_TABLET | Freq: Two times a day (BID) | ORAL | 3 refills | Status: DC

## 2019-12-20 MED ORDER — VENLAFAXINE HCL ER 37.5 MG PO CP24: 37.5000 mg | ORAL_CAPSULE | Freq: Every day | ORAL | 3 refills | Status: DC

## 2019-12-20 MED ORDER — AMLODIPINE BESYLATE 10 MG PO TABS: 10.0000 mg | ORAL_TABLET | Freq: Every day | ORAL | 3 refills | Status: DC

## 2019-12-20 MED ORDER — ATORVASTATIN CALCIUM 40 MG PO TABS: 40.0000 mg | ORAL_TABLET | Freq: Every day | ORAL | 3 refills | Status: DC

## 2019-12-20 MED ORDER — LOSARTAN POTASSIUM 100 MG PO TABS: 100.0000 mg | ORAL_TABLET | Freq: Every day | ORAL | 3 refills | Status: DC

## 2019-12-20 MED ORDER — FREESTYLE LIBRE 14 DAY READER DEVI: 1.0000 | Freq: Three times a day (TID) | 0 refills | Status: DC

## 2019-12-20 NOTE — Assessment & Plan Note (Signed)
Stable in the office today, continue current regimen. 

## 2019-12-20 NOTE — Patient Instructions (Addendum)
It is important that you improve your diet. Please limit carbohydrates in the form of white bread, rice, pasta, sweets, fast food, fried food, sugary drinks, etc. Increase your consumption of fresh fruits and vegetables, whole grains, lean protein.  Ensure you are consuming 64 ounces of water daily.  Start exercising. You should be getting 150 minutes of exercise weekly.  Increase your Lantus to 35 units nightly if your blood sugars remain in the high 100's or higher.  You must take Metformin twice daily, everyday.  Please resume your atorvastatin medication for cholesterol.  Resume venlafaxine ER 37.5 mg for anxiety.  Continue checking your blood sugar levels.  Appropriate times to check your blood sugar levels are:  -Before any meal (breakfast, lunch, dinner) -Two hours after any meal (breakfast, lunch, dinner) -Bedtime  Record your readings and notify me if you continue to consistently run at or above 200.  Schedule a follow up visit for 4 months for diabetes check.  It was a pleasure to see you today!   Diabetes Basics  Diabetes (diabetes mellitus) is a long-term (chronic) disease. It occurs when the body does not properly use sugar (glucose) that is released from food after you eat. Diabetes may be caused by one or both of these problems:  Your pancreas does not make enough of a hormone called insulin.  Your body does not react in a normal way to insulin that it makes. Insulin lets sugars (glucose) go into cells in your body. This gives you energy. If you have diabetes, sugars cannot get into cells. This causes high blood sugar (hyperglycemia). Follow these instructions at home: How is diabetes treated? You may need to take insulin or other diabetes medicines daily to keep your blood sugar in balance. Take your diabetes medicines every day as told by your doctor. List your diabetes medicines here: Diabetes medicines  Name of medicine:  ______________________________ ? Amount (dose): _______________ Time (a.m./p.m.): _______________ Notes: ___________________________________  Name of medicine: ______________________________ ? Amount (dose): _______________ Time (a.m./p.m.): _______________ Notes: ___________________________________  Name of medicine: ______________________________ ? Amount (dose): _______________ Time (a.m./p.m.): _______________ Notes: ___________________________________ If you use insulin, you will learn how to give yourself insulin by injection. You may need to adjust the amount based on the food that you eat. List the types of insulin you use here: Insulin  Insulin type: ______________________________ ? Amount (dose): _______________ Time (a.m./p.m.): _______________ Notes: ___________________________________  Insulin type: ______________________________ ? Amount (dose): _______________ Time (a.m./p.m.): _______________ Notes: ___________________________________  Insulin type: ______________________________ ? Amount (dose): _______________ Time (a.m./p.m.): _______________ Notes: ___________________________________  Insulin type: ______________________________ ? Amount (dose): _______________ Time (a.m./p.m.): _______________ Notes: ___________________________________  Insulin type: ______________________________ ? Amount (dose): _______________ Time (a.m./p.m.): _______________ Notes: ___________________________________ How do I manage my blood sugar?  Check your blood sugar levels using a blood glucose monitor as directed by your doctor. Your doctor will set treatment goals for you. Generally, you should have these blood sugar levels:  Before meals (preprandial): 80-130 mg/dL (4.4-7.2 mmol/L).  After meals (postprandial): below 180 mg/dL (10 mmol/L).  A1c level: less than 7%. Write down the times that you will check your blood sugar levels: Blood sugar checks  Time: _______________ Notes:  ___________________________________  Time: _______________ Notes: ___________________________________  Time: _______________ Notes: ___________________________________  Time: _______________ Notes: ___________________________________  Time: _______________ Notes: ___________________________________  Time: _______________ Notes: ___________________________________  What do I need to know about low blood sugar? Low blood sugar is called hypoglycemia. This is when blood sugar is at or below 70 mg/dL (  3.9 mmol/L). Symptoms may include:  Feeling: ? Hungry. ? Worried or nervous (anxious). ? Sweaty and clammy. ? Confused. ? Dizzy. ? Sleepy. ? Sick to your stomach (nauseous).  Having: ? A fast heartbeat. ? A headache. ? A change in your vision. ? Tingling or no feeling (numbness) around the mouth, lips, or tongue. ? Jerky movements that you cannot control (seizure).  Having trouble with: ? Moving (coordination). ? Sleeping. ? Passing out (fainting). ? Getting upset easily (irritability). Treating low blood sugar To treat low blood sugar, eat or drink something sugary right away. If you can think clearly and swallow safely, follow the 15:15 rule:  Take 15 grams of a fast-acting carb (carbohydrate). Talk with your doctor about how much you should take.  Some fast-acting carbs are: ? Sugar tablets (glucose pills). Take 3-4 glucose pills. ? 6-8 pieces of hard candy. ? 4-6 oz (120-150 mL) of fruit juice. ? 4-6 oz (120-150 mL) of regular (not diet) soda. ? 1 Tbsp (15 mL) honey or sugar.  Check your blood sugar 15 minutes after you take the carb.  If your blood sugar is still at or below 70 mg/dL (3.9 mmol/L), take 15 grams of a carb again.  If your blood sugar does not go above 70 mg/dL (3.9 mmol/L) after 3 tries, get help right away.  After your blood sugar goes back to normal, eat a meal or a snack within 1 hour. Treating very low blood sugar If your blood sugar is at or  below 54 mg/dL (3 mmol/L), you have very low blood sugar (severe hypoglycemia). This is an emergency. Do not wait to see if the symptoms will go away. Get medical help right away. Call your local emergency services (911 in the [place] Do not drive yourself to the hospital. Questions to ask your health care provider  Do I need to meet with a diabetes educator?  What equipment will I need to care for myself at home?  What diabetes medicines do I need? When should I take them?  How often do I need to check my blood sugar?  What number can I call if I have questions?  When is my next doctor's visit?  Where can I find a support group for people with diabetes? Where to find more information  American Diabetes Association: www.diabetes.org  American Association of Diabetes Educators: www.diabeteseducator.org/patient-resources Contact a doctor if:  Your blood sugar is at or above 240 mg/dL (13.3 mmol/L) for 2 days in a row.  You have been sick or have had a fever for 2 days or more, and you are not getting better.  You have any of these problems for more than 6 hours: ? You cannot eat or drink. ? You feel sick to your stomach (nauseous). ? You throw up (vomit). ? You have watery poop (diarrhea). Get help right away if:  Your blood sugar is lower than 54 mg/dL (3 mmol/L).  You get confused.  You have trouble: ? Thinking clearly. ? Breathing. Summary  Diabetes (diabetes mellitus) is a long-term (chronic) disease. It occurs when the body does not properly use sugar (glucose) that is released from food after digestion.  Take insulin and diabetes medicines as told.  Check your blood sugar every day, as often as told.  Keep all follow-up visits as told by your doctor. This is important. This information is not intended to replace advice given to you by your health care provider. Make sure you discuss any questions you have  with your health care provider. Document Revised:  06/12/2019 Document Reviewed: 12/22/2017 Elsevier Patient Education  [place].

## 2019-12-20 NOTE — Assessment & Plan Note (Signed)
Uncontrolled with A1c of 9.9. Also just recently started checking glucose levels.  Again reiterated the importance of checking glucose levels 2-3 times daily, she verbalized understanding and will look into getting the freestyle libre device.  She is working on her diet, is motivated today to reduce her A1c.  Discussed that she needs to take metformin twice daily, every day.  Discussed to increase Lantus to 35 units if her glucose readings continue to remain in the high 100s or above.  She can continue Lantus 30 units if she sees improvement in glucose levels with her diet.  We will plan to see her back in 4 months for diabetes follow-up.

## 2019-12-20 NOTE — Assessment & Plan Note (Signed)
Never followed through with psychiatry referral. Has been off of venlafaxine for quite some time, refill provided today per patient request.  She would like to defer psychiatry evaluation until after she resumes venlafaxine.

## 2019-12-20 NOTE — Assessment & Plan Note (Signed)
Repeat CBC and iron studies pending.  Noncompliant to daily iron, only taking 1-2 times weekly.

## 2019-12-20 NOTE — Progress Notes (Signed)
Subjective:    Patient ID: Danielle Edwards, female    DOB: June 18, 1956, 64 y.o.   MRN: [id number]  HPI  This visit occurred during the SARS-CoV-2 public health emergency.  Safety protocols were in place, including screening questions prior to the visit, additional usage of staff PPE, and extensive cleaning of exam room while observing appropriate contact time as indicated for disinfecting solutions.   Ms. Danielle Edwards is a 64 year old female with a history of hypertension, GERD, type 2 diabetes, CKD, anxiety and depression, hyperlipidemia, anemia who presents today for follow up.  1) Type 2 Diabetes:  Current medications include: Lantus 30 units, Metformin 1000 mg BID. She's been taking her Metformin once daily rather than twice daily..   She is checking her blood glucose 0 times daily. She did start checking her blood sugar once daily fasting in the morning for the last 1 week.  AM fasting: mid 100's-low 300's. Mid 100's over the last 2 days. 2 hours after dinner: high 100's  Last A1C: 9.9 today, 7.9 in November 2020 Last Eye Exam: UTD.  Last Foot Exam: Due Pneumonia Vaccination: Due in Summer 2021 ACE/ARB: Losartan Statin: atorvastatin. Has not taken in 9 months, never resumed.   She endorses a poor diet over the last several months, is not exercising.   2) Anemia: Managed on oral iron every other day. Due for repeat lab testing today. She denies rectal and vaginal bleeding. Since her last visit she's taking her oral iron once to twice weekly.  Following with nephrology for CKD.  3) Hyperlipidemia: Managed on atorvastatin 40 mg. During last lipid check she had stopped her atorvastatin for 6 months, LDL of 123.  She continues to refrain from use of atorvastatin.  4) Anxiety and Depression: Referred to psychiatry last visit given increased anxiety, forgetfulness despite Effexor use. History of ADD at one point.   She received a call from psychiatry office and she never called them back.  She has been off of venlafaxine for one year, would like to resume due to anxiety.   BP Readings from Last 3 Encounters:  12/20/19 130/86  10/18/19 132/76  09/17/19 140/82     Review of Systems  Respiratory: Negative for shortness of breath.   Cardiovascular: Negative for chest pain.  Neurological: Negative for dizziness.  Psychiatric/Behavioral: The patient is nervous/anxious.        Past Medical History:  Diagnosis Date  . Anemia    Iron defiency   . Chronic pain   . Diabetes type 2, controlled ([place])   . Headache   . History of colonic polyps 1999   Benign  . Hx of adenomatous polyp of colon 09/27/2000   2001 - diminutive adenoma [place] 2007 no polyps - [place]  . Hypertension   . Hypothyroidism   . IBS (irritable bowel syndrome)    Diarrhea type  . Vitamin D deficiency      Social History   Socioeconomic History  . Marital status: Married    Spouse name: Not on file  . Number of children: Not on file  . Years of education: Not on file  . Highest education level: Not on file  Occupational History  . Not on file  Tobacco Use  . Smoking status: Never Smoker  . Smokeless tobacco: Never Used  Substance and Sexual Activity  . Alcohol use: Yes    Alcohol/week: 0.0 standard drinks    Comment: rarely  . Drug use: No  . Sexual activity: Not on  file  Other Topics Concern  . Not on file  Social History Narrative   Married.   3 children.   Retired, worked at [employer] in [place], [place].   Enjoys being with her grandchildren, being with her friends.   Social Determinants of Health   Financial Resource Strain:   . Difficulty of Paying Living Expenses:   Food Insecurity:   . Worried About Charity fundraiser in the Last Year:   . Arboriculturist in the Last Year:   Transportation Needs:   . Film/video editor (Medical):   Marland Kitchen Lack of Transportation (Non-Medical):   Physical Activity:   . Days of Exercise per Week:   . Minutes of Exercise per Session:     Stress:   . Feeling of Stress :   Social Connections:   . Frequency of Communication with Friends and Family:   . Frequency of Social Gatherings with Friends and Family:   . Attends Religious Services:   . Active Member of Clubs or Organizations:   . Attends Archivist Meetings:   Marland Kitchen Marital Status:   Intimate Partner Violence:   . Fear of Current or Ex-Partner:   . Emotionally Abused:   Marland Kitchen Physically Abused:   . Sexually Abused:     Past Surgical History:  Procedure Laterality Date  . CHOLECYSTECTOMY  1995  . COLONOSCOPY  multiple  . NASAL SINUS SURGERY  2001  . TONSILLECTOMY AND ADENOIDECTOMY    . VAGINAL HYSTERECTOMY     Complete    Family History  Problem Relation Age of Onset  . Colon cancer Mother   . Lung cancer Mother   . Skin cancer Mother   . Heart disease Father   . Diabetes Father   . Skin cancer Father   . Breast cancer Neg Hx     No Known Allergies  Current Outpatient Medications on File Prior to Visit  Medication Sig Dispense Refill  . Accu-Chek FastClix Lancets MISC USE AS INSTRUCTED TO CHECK BLOOD SUGAR UP TO 3 TIMES DAILY 306 each 1  . blood glucose meter kit and supplies KIT Dispense based on patient and insurance preference. Use up to four times daily as directed. (FOR ICD-9 250.00, 250.01). 1 each 0  . Blood Glucose Monitoring Suppl (ACCU-CHEK AVIVA PLUS) w/Device KIT Use as instructed to test blood sugar 3 times daily 1 kit 0  . glimepiride (AMARYL) 4 MG tablet TAKE 2 TABLETS BY MOUTH ONCE DAILY WITH BREAKFAST FOR DIABETES 180 tablet 3  . glucose blood (ACCU-CHEK AVIVA) test strip Use as instructed to test blood sugar up to 3 times daily 300 each 1  . insulin glargine (LANTUS SOLOSTAR) 100 UNIT/ML Solostar Pen Inject 30 Units into the skin daily. 15 mL 0  . Insulin Pen Needle 32G X 6 MM MISC Use as instructed to inject insulin daily at bedtime 100 each 3  . loperamide (IMODIUM A-D) 2 MG tablet Take 2 mg by mouth 4 (four) times daily as  needed for diarrhea or loose stools.    Marland Kitchen oxyCODONE (OXY IR/ROXICODONE) 5 MG immediate release tablet Take 5 mg by mouth 4 (four) times daily as needed.    . pantoprazole (PROTONIX) 20 MG tablet Take 1 tablet (20 mg total) by mouth daily. 90 tablet 0  . promethazine (PHENERGAN) 25 MG tablet Take 25 mg by mouth every 6 (six) hours as needed. Reported on 09/21/2015  1   No current facility-administered medications on file prior to  visit.    BP 130/86   Pulse 80   Temp (!) 97.4 F (36.3 C) (Temporal)   Ht 5' 6"  (1.676 m)   Wt 274 lb (124.3 kg)   SpO2 97%   BMI 44.22 kg/m     Objective:   Physical Exam  Constitutional: She appears well-nourished.  Cardiovascular: Normal rate and regular rhythm.  Respiratory: Effort normal and breath sounds normal.  Musculoskeletal:     Cervical back: Neck supple.  Skin: Skin is warm and dry.  Psychiatric: She has a normal mood and affect.           Assessment & Plan:

## 2019-12-20 NOTE — Assessment & Plan Note (Signed)
Continues to remain off of statin therapy, discussed the importance of statin therapy in the presence of diabetes and hypertension.  Refill sent to pharmacy.  Repeat lipids next visit.  The 10-year ASCVD risk score Mikey Bussing DC Brooke Bonito., et al., 2013) is: 14.1%   Values used to calculate the score:     Age: 64 years     Sex: Female     Is Non-Hispanic African American: No     Diabetic: Yes     Tobacco smoker: No     Systolic Blood Pressure: [id number]     Is BP treated: Yes     HDL Cholesterol: 48.9 mg/dL     Total Cholesterol: 211 mg/dL

## 2019-12-27 ENCOUNTER — Other Ambulatory Visit: Payer: Self-pay | Admitting: Primary Care

## 2019-12-27 DIAGNOSIS — E119 Type 2 diabetes mellitus without complications: Secondary | ICD-10-CM

## 2020-01-08 ENCOUNTER — Telehealth: Payer: Self-pay | Admitting: Primary Care

## 2020-01-08 DIAGNOSIS — E119 Type 2 diabetes mellitus without complications: Secondary | ICD-10-CM

## 2020-01-08 NOTE — Telephone Encounter (Signed)
Please advise update on direction and how much you want to give.  Spoken to patient she stated that she wanted new direction because she was confused

## 2020-01-08 NOTE — Telephone Encounter (Signed)
Tried to call patient but could not leave message since VM is full

## 2020-01-08 NOTE — Telephone Encounter (Signed)
We discussed during her visit to continue with Lantus 30 units if her glucose continued to improve with dietary changes. If no improvement in glucose levels then increase to 35.   Which is she currently doing? 30 or 35?  How are her blood sugars running?

## 2020-01-08 NOTE — Telephone Encounter (Signed)
Pt states that her Lantus instructions were changed last OV and she needs a new Rx sent to Lantus with qty adjustments.   Please advise, thanks.

## 2020-01-13 NOTE — Telephone Encounter (Signed)
Tried to call patient but could not leave message since VM is full

## 2020-01-15 DIAGNOSIS — M5416 Radiculopathy, lumbar region: Secondary | ICD-10-CM | POA: Diagnosis not present

## 2020-01-15 DIAGNOSIS — M545 Low back pain: Secondary | ICD-10-CM | POA: Diagnosis not present

## 2020-01-15 DIAGNOSIS — M797 Fibromyalgia: Secondary | ICD-10-CM | POA: Diagnosis not present

## 2020-01-15 DIAGNOSIS — Z79899 Other long term (current) drug therapy: Secondary | ICD-10-CM | POA: Diagnosis not present

## 2020-01-15 DIAGNOSIS — M5382 Other specified dorsopathies, cervical region: Secondary | ICD-10-CM | POA: Diagnosis not present

## 2020-01-15 DIAGNOSIS — M5412 Radiculopathy, cervical region: Secondary | ICD-10-CM | POA: Diagnosis not present

## 2020-01-28 ENCOUNTER — Telehealth: Payer: Self-pay | Admitting: Primary Care

## 2020-01-28 NOTE — Progress Notes (Signed)
  Chronic Care Management   Outreach Note  01/28/2020 Name: Chihiro Ganzer MRN: [id number] DOB: 1956/09/28  Referred by: Pleas Koch, NP Reason for referral : No chief complaint on file.   An unsuccessful telephone outreach was attempted today. The patient was referred to the pharmacist for assistance with care management and care coordination.    This note is not being shared with the patient for the following reason: To respect privacy (The patient or proxy has requested that the information not be shared).  Follow Up Plan:   [name] [employer] Scheduler

## 2020-02-03 ENCOUNTER — Telehealth: Payer: Self-pay | Admitting: Primary Care

## 2020-02-03 DIAGNOSIS — G894 Chronic pain syndrome: Secondary | ICD-10-CM

## 2020-02-03 NOTE — Telephone Encounter (Signed)
Patient called She is needing a new referral to see her orthopedic doctor.  She stated that it has been over 3 years since she seen him and they are requesting a new referral before they will see her.   Patient provided the fax# [phone] Dr Scarlette Shorts. Patient stated she thought the office name was [facility]

## 2020-02-03 NOTE — Telephone Encounter (Signed)
Noted, referral placed.  

## 2020-02-06 ENCOUNTER — Telehealth: Payer: Self-pay | Admitting: Primary Care

## 2020-02-06 NOTE — Progress Notes (Signed)
  Chronic Care Management   Outreach Note  02/06/2020 Name: Danielle Edwards MRN: [id number] DOB: 02-Feb-1956  Referred by: Pleas Koch, NP Reason for referral : No chief complaint on file.   A second unsuccessful telephone outreach was attempted today. The patient was referred to pharmacist for assistance with care management and care coordination.   This note is not being shared with the patient for the following reason: To respect privacy (The patient or proxy has requested that the information not be shared).  Follow Up Plan:   [name] [employer] Scheduler

## 2020-02-07 DIAGNOSIS — M25511 Pain in right shoulder: Secondary | ICD-10-CM | POA: Diagnosis not present

## 2020-02-07 DIAGNOSIS — M25512 Pain in left shoulder: Secondary | ICD-10-CM | POA: Diagnosis not present

## 2020-02-07 DIAGNOSIS — R5383 Other fatigue: Secondary | ICD-10-CM | POA: Diagnosis not present

## 2020-02-07 DIAGNOSIS — M353 Polymyalgia rheumatica: Secondary | ICD-10-CM | POA: Diagnosis not present

## 2020-02-07 DIAGNOSIS — M545 Low back pain: Secondary | ICD-10-CM | POA: Diagnosis not present

## 2020-02-07 DIAGNOSIS — M25552 Pain in left hip: Secondary | ICD-10-CM | POA: Diagnosis not present

## 2020-02-07 DIAGNOSIS — M797 Fibromyalgia: Secondary | ICD-10-CM | POA: Diagnosis not present

## 2020-02-07 DIAGNOSIS — Z13820 Encounter for screening for osteoporosis: Secondary | ICD-10-CM | POA: Diagnosis not present

## 2020-02-07 DIAGNOSIS — Z79899 Other long term (current) drug therapy: Secondary | ICD-10-CM | POA: Diagnosis not present

## 2020-02-07 DIAGNOSIS — E559 Vitamin D deficiency, unspecified: Secondary | ICD-10-CM | POA: Diagnosis not present

## 2020-02-07 DIAGNOSIS — M79642 Pain in left hand: Secondary | ICD-10-CM | POA: Diagnosis not present

## 2020-02-07 DIAGNOSIS — M79641 Pain in right hand: Secondary | ICD-10-CM | POA: Diagnosis not present

## 2020-02-07 DIAGNOSIS — M25551 Pain in right hip: Secondary | ICD-10-CM | POA: Diagnosis not present

## 2020-02-20 DIAGNOSIS — R600 Localized edema: Secondary | ICD-10-CM | POA: Insufficient documentation

## 2020-02-21 DIAGNOSIS — E669 Obesity, unspecified: Secondary | ICD-10-CM | POA: Diagnosis not present

## 2020-02-21 DIAGNOSIS — M353 Polymyalgia rheumatica: Secondary | ICD-10-CM | POA: Diagnosis not present

## 2020-02-21 DIAGNOSIS — R5383 Other fatigue: Secondary | ICD-10-CM | POA: Diagnosis not present

## 2020-02-21 DIAGNOSIS — M545 Low back pain: Secondary | ICD-10-CM | POA: Diagnosis not present

## 2020-02-21 DIAGNOSIS — Z13820 Encounter for screening for osteoporosis: Secondary | ICD-10-CM | POA: Diagnosis not present

## 2020-02-21 DIAGNOSIS — M797 Fibromyalgia: Secondary | ICD-10-CM | POA: Diagnosis not present

## 2020-02-21 DIAGNOSIS — Z79899 Other long term (current) drug therapy: Secondary | ICD-10-CM | POA: Diagnosis not present

## 2020-02-24 ENCOUNTER — Other Ambulatory Visit: Payer: Self-pay | Admitting: Primary Care

## 2020-02-24 DIAGNOSIS — E119 Type 2 diabetes mellitus without complications: Secondary | ICD-10-CM

## 2020-02-27 DIAGNOSIS — E559 Vitamin D deficiency, unspecified: Secondary | ICD-10-CM | POA: Diagnosis not present

## 2020-02-27 DIAGNOSIS — N189 Chronic kidney disease, unspecified: Secondary | ICD-10-CM | POA: Diagnosis not present

## 2020-02-27 DIAGNOSIS — D631 Anemia in chronic kidney disease: Secondary | ICD-10-CM | POA: Diagnosis not present

## 2020-02-27 DIAGNOSIS — E1122 Type 2 diabetes mellitus with diabetic chronic kidney disease: Secondary | ICD-10-CM | POA: Diagnosis not present

## 2020-02-27 DIAGNOSIS — I10 Essential (primary) hypertension: Secondary | ICD-10-CM | POA: Diagnosis not present

## 2020-03-03 DIAGNOSIS — I10 Essential (primary) hypertension: Secondary | ICD-10-CM | POA: Diagnosis not present

## 2020-03-03 DIAGNOSIS — E1122 Type 2 diabetes mellitus with diabetic chronic kidney disease: Secondary | ICD-10-CM | POA: Diagnosis not present

## 2020-03-03 DIAGNOSIS — N1831 Chronic kidney disease, stage 3a: Secondary | ICD-10-CM | POA: Diagnosis not present

## 2020-03-03 DIAGNOSIS — Z6841 Body Mass Index (BMI) 40.0 and over, adult: Secondary | ICD-10-CM | POA: Diagnosis not present

## 2020-03-03 DIAGNOSIS — R600 Localized edema: Secondary | ICD-10-CM | POA: Diagnosis not present

## 2020-03-04 ENCOUNTER — Telehealth: Payer: Self-pay | Admitting: Primary Care

## 2020-03-04 DIAGNOSIS — E119 Type 2 diabetes mellitus without complications: Secondary | ICD-10-CM

## 2020-03-04 MED ORDER — INSULIN PEN NEEDLE 32G X 6 MM MISC: 3 refills | Status: DC

## 2020-03-04 NOTE — Telephone Encounter (Signed)
Noted. Refill sent as requested.

## 2020-03-04 NOTE — Telephone Encounter (Signed)
Patient called requesting a refill Insulin Pen Needle 32G X 6 MM MISC  She stated that [facility] told her to reach out to our office. They advised they sent a request last week but did not hear back about the pen needles.  Patient has 1 left    [place]

## 2020-03-19 DIAGNOSIS — M545 Low back pain: Secondary | ICD-10-CM | POA: Diagnosis not present

## 2020-03-19 DIAGNOSIS — M5416 Radiculopathy, lumbar region: Secondary | ICD-10-CM | POA: Diagnosis not present

## 2020-03-19 DIAGNOSIS — M5412 Radiculopathy, cervical region: Secondary | ICD-10-CM | POA: Diagnosis not present

## 2020-03-19 DIAGNOSIS — M5382 Other specified dorsopathies, cervical region: Secondary | ICD-10-CM | POA: Diagnosis not present

## 2020-04-20 ENCOUNTER — Ambulatory Visit: Payer: Medicare HMO | Admitting: Primary Care

## 2020-04-24 ENCOUNTER — Other Ambulatory Visit: Payer: Self-pay | Admitting: Primary Care

## 2020-04-24 DIAGNOSIS — E119 Type 2 diabetes mellitus without complications: Secondary | ICD-10-CM

## 2020-04-24 NOTE — Telephone Encounter (Signed)
Patient is overdue for an office visit, please schedule.

## 2020-04-24 NOTE — Telephone Encounter (Signed)
[facility] called stating Lantus SOLOSTAR 100 ML needs prescription refilled. They are giving her a emergency refill right now because she is out but she will need a refill ASAP.

## 2020-05-04 ENCOUNTER — Encounter: Payer: Self-pay | Admitting: Internal Medicine

## 2020-05-11 NOTE — Telephone Encounter (Signed)
Attempted to call patient and schedule for OV for medications. Mail box full. Sent letter to patient to schedule.

## 2020-05-14 DIAGNOSIS — M5416 Radiculopathy, lumbar region: Secondary | ICD-10-CM | POA: Diagnosis not present

## 2020-05-14 DIAGNOSIS — M5382 Other specified dorsopathies, cervical region: Secondary | ICD-10-CM | POA: Diagnosis not present

## 2020-05-14 DIAGNOSIS — M5412 Radiculopathy, cervical region: Secondary | ICD-10-CM | POA: Diagnosis not present

## 2020-05-14 DIAGNOSIS — M545 Low back pain: Secondary | ICD-10-CM | POA: Diagnosis not present

## 2020-05-25 ENCOUNTER — Telehealth (INDEPENDENT_AMBULATORY_CARE_PROVIDER_SITE_OTHER): Payer: Medicare HMO | Admitting: Internal Medicine

## 2020-05-25 ENCOUNTER — Encounter: Payer: Self-pay | Admitting: Internal Medicine

## 2020-05-25 DIAGNOSIS — R05 Cough: Secondary | ICD-10-CM | POA: Diagnosis not present

## 2020-05-25 DIAGNOSIS — R0989 Other specified symptoms and signs involving the circulatory and respiratory systems: Secondary | ICD-10-CM | POA: Diagnosis not present

## 2020-05-25 DIAGNOSIS — R5383 Other fatigue: Secondary | ICD-10-CM

## 2020-05-25 DIAGNOSIS — R197 Diarrhea, unspecified: Secondary | ICD-10-CM | POA: Diagnosis not present

## 2020-05-25 DIAGNOSIS — R059 Cough, unspecified: Secondary | ICD-10-CM

## 2020-05-25 NOTE — Patient Instructions (Signed)

## 2020-05-25 NOTE — Progress Notes (Signed)
Virtual Visit via Video Note  I connected with Danielle Edwards on 05/25/20 at  2:00 PM EDT by a video enabled telemedicine application and verified that I am speaking with the correct person using two identifiers.  Location: Patient: Home Provider: Office  Persons participating in this video visit: Webb Silversmith, NP and [employer].   I discussed the limitations of evaluation and management by telemedicine and the availability of in person appointments. The patient expressed understanding and agreed to proceed.  History of Present Illness:  Pt reports runny nose, cough and diarrhea. This started 3 days ago.  She is blowing clear mucous out of her nose.The cough is mostly nonproductive. She denies headache, dizziness, but does feel like she can not see as clear. She denies nasal congestion, ear pain, sore throat, loss of taste or smell, or SOB. She has had some chest heaviness but denies chest pain or chest tightness. She reports loose stools without blood, but denies nausea and vomiting. She denies fever or chills but has had some body aches.  She has taken Ibuprofen as needed with some relief.  She has had sick contacts with similar symptoms. She has had her Covid vaccine.  Past Medical History:  Diagnosis Date  . Anemia    Iron defiency   . Chronic pain   . Diabetes type 2, controlled ([place])   . Headache   . History of colonic polyps 1999   Benign  . Hx of adenomatous polyp of colon 09/27/2000   2001 - diminutive adenoma [place] 2007 no polyps - [place]  . Hypertension   . Hypothyroidism   . IBS (irritable bowel syndrome)    Diarrhea type  . Vitamin D deficiency     Current Outpatient Medications  Medication Sig Dispense Refill  . Accu-Chek FastClix Lancets MISC USE AS INSTRUCTED TO CHECK BLOOD SUGAR UP TO 3 TIMES DAILY 306 each 1  . amLODipine (NORVASC) 10 MG tablet Take 1 tablet (10 mg total) by mouth daily. For blood pressure. 90 tablet 3  . atorvastatin (LIPITOR) 40 MG  tablet Take 1 tablet (40 mg total) by mouth at bedtime. For cholesterol. 90 tablet 3  . blood glucose meter kit and supplies KIT Dispense based on patient and insurance preference. Use up to four times daily as directed. (FOR ICD-9 250.00, 250.01). 1 each 0  . Blood Glucose Monitoring Suppl (ACCU-CHEK AVIVA PLUS) w/Device KIT Use as instructed to test blood sugar 3 times daily 1 kit 0  . Continuous Blood Gluc Receiver (FREESTYLE LIBRE 14 DAY READER) DEVI 1 Device by Does not apply route 3 (three) times daily. Dx E11.9 1 each 0  . Continuous Blood Gluc Sensor (FREESTYLE LIBRE 14 DAY SENSOR) MISC 1 Device by Does not apply route 3 (three) times daily. Dx E11.9 2 each 5  . glimepiride (AMARYL) 4 MG tablet TAKE 2 TABLETS BY MOUTH ONCE DAILY WITH BREAKFAST FOR DIABETES 180 tablet 3  . glucose blood (ACCU-CHEK AVIVA) test strip Use as instructed to test blood sugar up to 3 times daily 300 each 1  . Insulin Pen Needle 32G X 6 MM MISC Use as instructed to inject insulin daily at bedtime 100 each 3  . LANTUS SOLOSTAR 100 UNIT/ML Solostar Pen INJECT 30 UNITS INTO THE SKIN DAILY 15 mL 0  . loperamide (IMODIUM A-D) 2 MG tablet Take 2 mg by mouth 4 (four) times daily as needed for diarrhea or loose stools.    Marland Kitchen losartan (COZAAR) 100 MG tablet Take 1  tablet (100 mg total) by mouth daily. For blood pressure. 90 tablet 3  . metFORMIN (GLUCOPHAGE) 1000 MG tablet Take 1 tablet (1,000 mg total) by mouth 2 (two) times daily with a meal. For diabetes. 180 tablet 3  . oxyCODONE (OXY IR/ROXICODONE) 5 MG immediate release tablet Take 5 mg by mouth 4 (four) times daily as needed.    . pantoprazole (PROTONIX) 20 MG tablet Take 1 tablet (20 mg total) by mouth daily. 90 tablet 0  . promethazine (PHENERGAN) 25 MG tablet Take 25 mg by mouth every 6 (six) hours as needed. Reported on 09/21/2015  1  . venlafaxine XR (EFFEXOR XR) 37.5 MG 24 hr capsule Take 1 capsule (37.5 mg total) by mouth daily with breakfast. For anxiety and  depression. 90 capsule 3   No current facility-administered medications for this visit.    No Known Allergies  Family History  Problem Relation Age of Onset  . Colon cancer Mother   . Lung cancer Mother   . Skin cancer Mother   . Heart disease Father   . Diabetes Father   . Skin cancer Father   . Breast cancer Neg Hx     Social History   Socioeconomic History  . Marital status: Married    Spouse name: Not on file  . Number of children: Not on file  . Years of education: Not on file  . Highest education level: Not on file  Occupational History  . Not on file  Tobacco Use  . Smoking status: Never Smoker  . Smokeless tobacco: Never Used  Substance and Sexual Activity  . Alcohol use: Yes    Alcohol/week: 0.0 standard drinks    Comment: rarely  . Drug use: No  . Sexual activity: Not on file  Other Topics Concern  . Not on file  Social History Narrative   Married.   3 children.   Retired, worked at [employer] in [place], [place].   Enjoys being with her grandchildren, being with her friends.   Social Determinants of Health   Financial Resource Strain:   . Difficulty of Paying Living Expenses: Not on file  Food Insecurity:   . Worried About Charity fundraiser in the Last Year: Not on file  . Ran Out of Food in the Last Year: Not on file  Transportation Needs:   . Lack of Transportation (Medical): Not on file  . Lack of Transportation (Non-Medical): Not on file  Physical Activity:   . Days of Exercise per Week: Not on file  . Minutes of Exercise per Session: Not on file  Stress:   . Feeling of Stress : Not on file  Social Connections:   . Frequency of Communication with Friends and Family: Not on file  . Frequency of Social Gatherings with Friends and Family: Not on file  . Attends Religious Services: Not on file  . Active Member of Clubs or Organizations: Not on file  . Attends Archivist Meetings: Not on file  . Marital Status: Not on file  Intimate  Partner Violence:   . Fear of Current or Ex-Partner: Not on file  . Emotionally Abused: Not on file  . Physically Abused: Not on file  . Sexually Abused: Not on file     Constitutional: Pt reports fatigue. Denies fever, malaise, headache or abrupt weight changes.  HEENT: Pt reports runny nose. Denies eye pain, eye redness, ear pain, ringing in the ears, wax buildup, nasal congestion, bloody nose, or sore throat.  Respiratory: Pt reports cough. Denies difficulty breathing, shortness of breath, or sputum production.   Cardiovascular: Pt reports chest heaviness. Denies chest pain, chest tightness, palpitations or swelling in the hands or feet.  Gastrointestinal: Pt reports diarrhea. Denies abdominal pain, bloating, constipation, or blood in the stool.  Musculoskeletal: Pt reports body aches. Denies decrease in range of motion, difficulty with gait, or joint pain and swelling.  Skin: Denies redness, rashes, lesions or ulcercations.   No other specific complaints in a complete review of systems (except as listed in HPI above).  Observations/Objective:    Wt Readings from Last 3 Encounters:  12/20/19 274 lb (124.3 kg)  10/18/19 274 lb (124.3 kg)  09/17/19 279 lb 4 oz (126.7 kg)    General: Appears her stated age, well developed, well nourished in NAD. HEENT: Head: normal shape and size; Nose: no congestion noted ; Throat/Mouth: no hoarseness noted Pulmonary/Chest: Normal effort. No respiratory distress.  Neurological: Alert and oriented.   BMET    Component Value Date/Time   NA 137 12/20/2019 1129   K 4.0 12/20/2019 1129   CL 101 12/20/2019 1129   CO2 26 12/20/2019 1129   GLUCOSE 205 (H) 12/20/2019 1129   BUN 27 (H) 12/20/2019 1129   CREATININE 1.40 (H) 12/20/2019 1129   CALCIUM 9.9 12/20/2019 1129    Lipid Panel     Component Value Date/Time   CHOL 211 (H) 09/17/2019 1523   TRIG 194.0 (H) 09/17/2019 1523   HDL 48.90 09/17/2019 1523   CHOLHDL 4 09/17/2019 1523   VLDL  38.8 09/17/2019 1523   LDLCALC 123 (H) 09/17/2019 1523    CBC    Component Value Date/Time   WBC 7.3 12/20/2019 1129   RBC 3.96 12/20/2019 1129   HGB 11.1 (L) 12/20/2019 1129   HCT 34.1 (L) 12/20/2019 1129   PLT 253.0 12/20/2019 1129   MCV 86.0 12/20/2019 1129   MCHC 32.6 12/20/2019 1129   RDW 15.2 12/20/2019 1129   LYMPHSABS 2.1 01/23/2018 1048   MONOABS 0.7 01/23/2018 1048   EOSABS 0.4 01/23/2018 1048   BASOSABS 0.1 01/23/2018 1048    Hgb A1C Lab Results  Component Value Date   HGBA1C 9.9 (A) 12/20/2019       Assessment and Plan:  Fatigue, Runny Nose, Cough and Diarrhea:  Viral respiratory illness vs mild covid She will get tested either OTC or at [facility] Discussed symptomatic care: rest, fluids Can take Delsym OTC for cough She declines RX for Albuterol inhaler at this time Discussed the importance of masking, social distancing, frequent handwashing and self quarantine until she has a negative Covid test/symptoms resolve  Return precautions discussed  Follow Up Instructions:    I discussed the assessment and treatment plan with the patient. The patient was provided an opportunity to ask questions and all were answered. The patient agreed with the plan and demonstrated an understanding of the instructions.   The patient was advised to call back or seek an in-person evaluation if the symptoms worsen or if the condition fails to improve as anticipated.   Webb Silversmith, NP

## 2020-05-26 ENCOUNTER — Telehealth: Payer: Self-pay | Admitting: Primary Care

## 2020-05-26 DIAGNOSIS — Z03818 Encounter for observation for suspected exposure to other biological agents ruled out: Secondary | ICD-10-CM | POA: Diagnosis not present

## 2020-05-26 DIAGNOSIS — Z20822 Contact with and (suspected) exposure to covid-19: Secondary | ICD-10-CM | POA: Diagnosis not present

## 2020-05-26 DIAGNOSIS — U071 COVID-19: Secondary | ICD-10-CM

## 2020-05-26 MED ORDER — HYDROCODONE-HOMATROPINE 5-1.5 MG/5ML PO SYRP: 5.0000 mL | ORAL_SOLUTION | Freq: Three times a day (TID) | ORAL | 0 refills | Status: DC | PRN

## 2020-05-26 NOTE — Addendum Note (Signed)
Addended by: Jearld Fenton on: 05/26/2020 09:15 PM   Modules accepted: Orders

## 2020-05-26 NOTE — Telephone Encounter (Signed)
Hycodan sent to pharmacy 

## 2020-05-26 NOTE — Telephone Encounter (Signed)
Pt called stating she changed her mind she would like something for her cough She stated last night and this morning she has had bad coughing which caused her throat  To be sore  She stated both her and her husband went to be tested for covid  [facility] And should get results this after noon  [place]

## 2020-05-27 ENCOUNTER — Ambulatory Visit: Payer: Medicare HMO | Admitting: Primary Care

## 2020-05-27 NOTE — Telephone Encounter (Signed)
Noted, sign her up for Covid companion

## 2020-05-27 NOTE — Telephone Encounter (Signed)
Pt called with an update that she has tested positive for Covid.

## 2020-05-27 NOTE — Addendum Note (Signed)
Addended by: Lurlean Nanny on: 05/27/2020 10:02 AM   Modules accepted: Orders

## 2020-05-27 NOTE — Telephone Encounter (Signed)
Ordered with temperature monitoring

## 2020-06-02 ENCOUNTER — Ambulatory Visit
Admission: RE | Admit: 2020-06-02 | Discharge: 2020-06-02 | Disposition: A | Discharge status: Home / Self Care | Payer: Medicare HMO | Source: Ambulatory Visit | Attending: Emergency Medicine | Admitting: Emergency Medicine

## 2020-06-02 ENCOUNTER — Other Ambulatory Visit: Payer: Self-pay

## 2020-06-02 ENCOUNTER — Telehealth (INDEPENDENT_AMBULATORY_CARE_PROVIDER_SITE_OTHER): Payer: Medicare HMO | Admitting: Primary Care

## 2020-06-02 ENCOUNTER — Ambulatory Visit
Admission: RE | Admit: 2020-06-02 | Discharge: 2020-06-02 | Disposition: A | Discharge status: Home / Self Care | Payer: Medicare HMO | Attending: Primary Care | Admitting: Primary Care

## 2020-06-02 ENCOUNTER — Ambulatory Visit
Admission: EM | Admit: 2020-06-02 | Discharge: 2020-06-02 | Disposition: A | Discharge status: Home / Self Care | Payer: Medicare HMO | Attending: Emergency Medicine | Admitting: Emergency Medicine

## 2020-06-02 ENCOUNTER — Encounter: Payer: Self-pay | Admitting: Primary Care

## 2020-06-02 DIAGNOSIS — R05 Cough: Secondary | ICD-10-CM | POA: Diagnosis not present

## 2020-06-02 DIAGNOSIS — U071 COVID-19: Secondary | ICD-10-CM | POA: Diagnosis not present

## 2020-06-02 DIAGNOSIS — R0602 Shortness of breath: Secondary | ICD-10-CM | POA: Diagnosis not present

## 2020-06-02 HISTORY — DX: COVID-19: U07.1

## 2020-06-02 NOTE — ED Triage Notes (Signed)
Patient reports her PCP referred her to [facility] to get a chest x-ray

## 2020-06-02 NOTE — ED Provider Notes (Signed)
[facility]    CSN: [id number] Arrival date & time: 06/02/20  1232      History   Chief Complaint Chief Complaint  Patient presents with  . COVID Positive    HPI Danielle Edwards is a 64 y.o. female.   Patient presents with shortness of breath and nonproductive cough.  She denies fever, chills, sore throat, vomiting, diarrhea, rash, or other symptoms.  She tested COVID positive on 05/26/2020.  Patient had a video visit with her PCP today; she is beyond the window of time for monoclonal antibody treatment; she is on day 10 of her symptoms; her PCP instructed her to come here for evaluation.  Her medical history includes diabetes, hypertension, CKD.  Patient received the [employer] COVID vaccine in March.     The history is provided by the patient.    Past Medical History:  Diagnosis Date  . Anemia    Iron defiency   . Chronic pain   . Diabetes type 2, controlled ([facility])   . Headache   . History of colonic polyps 1999   Benign  . Hx of adenomatous polyp of colon 09/27/2000   2001 - diminutive adenoma [place] 2007 no polyps - [place]  . Hypertension   . Hypothyroidism   . IBS (irritable bowel syndrome)    Diarrhea type  . Vitamin D deficiency     Patient Active Problem List   Diagnosis Date Noted  . COVID-19 virus infection 06/02/2020  . Hypercalcemia 02/20/2020  . Localized edema 02/20/2020  . History of attention deficit disorder 10/18/2019  . Fatigue 09/17/2019  . CKD (chronic kidney disease) stage 3, GFR 30-59 ml/min 12/11/2018  . Anemia 02/06/2018  . Vitamin D deficiency 08/05/2016  . Recurrent sinusitis 10/30/2015  . Encounter for annual general medical examination with abnormal findings in adult 04/13/2015  . Hyperlipidemia LDL goal <100 04/13/2015  . Esophageal reflux 03/23/2015  . Family history of colon cancer in mother 02/23/2015  . Family history of malignant neoplasm of digestive organs 02/23/2015  . Essential hypertension 02/10/2015    . DM type 2 (diabetes mellitus, type 2) ([facility]) 02/10/2015  . Hypothyroidism 02/10/2015  . Chronic pain 02/10/2015  . Anxiety and depression 02/10/2015  . Hx of adenomatous polyp of colon 09/27/2000    Past Surgical History:  Procedure Laterality Date  . CHOLECYSTECTOMY  1995  . COLONOSCOPY  multiple  . NASAL SINUS SURGERY  2001  . TONSILLECTOMY AND ADENOIDECTOMY    . VAGINAL HYSTERECTOMY     Complete    OB History   No obstetric history on file.      Home Medications    Prior to Admission medications   Medication Sig Start Date End Date Taking? Authorizing Provider  Accu-Chek FastClix Lancets MISC USE AS INSTRUCTED TO CHECK BLOOD SUGAR UP TO 3 TIMES DAILY 10/22/19   Clark, Katherine K, NP  amLODipine (NORVASC) 10 MG tablet Take 1 tablet (10 mg total) by mouth daily. For blood pressure. 12/20/19   Clark, Katherine K, NP  atorvastatin (LIPITOR) 40 MG tablet Take 1 tablet (40 mg total) by mouth at bedtime. For cholesterol. 12/20/19   Clark, Katherine K, NP  blood glucose meter kit and supplies KIT Dispense based on patient and insurance preference. Use up to four times daily as directed. (FOR ICD-9 250.00, 250.01). 09/17/19   Clark, Katherine K, NP  Blood Glucose Monitoring Suppl (ACCU-CHEK AVIVA PLUS) w/Device KIT Use as instructed to test blood sugar 3 times daily 03/16/18     Pleas Koch, NP  Continuous Blood Gluc Receiver (FREESTYLE LIBRE 14 DAY READER) DEVI 1 Device by Does not apply route 3 (three) times daily. Dx E11.9 12/20/19   Pleas Koch, NP  Continuous Blood Gluc Sensor (FREESTYLE LIBRE 14 DAY SENSOR) MISC 1 Device by Does not apply route 3 (three) times daily. Dx E11.9 12/20/19   Pleas Koch, NP  glimepiride (AMARYL) 4 MG tablet TAKE 2 TABLETS BY MOUTH ONCE DAILY WITH BREAKFAST FOR DIABETES 09/03/19   Pleas Koch, NP  glucose blood (ACCU-CHEK AVIVA) test strip Use as instructed to test blood sugar up to 3 times daily 03/16/18   Pleas Koch, NP   HYDROcodone-homatropine [facility]) 5-1.5 MG/5ML syrup Take 5 mLs by mouth every 8 (eight) hours as needed for cough. 05/26/20   Jearld Fenton, NP  Insulin Pen Needle 32G X 6 MM MISC Use as instructed to inject insulin daily at bedtime 03/04/20   Pleas Koch, NP  LANTUS SOLOSTAR 100 UNIT/ML Solostar Pen INJECT 30 UNITS INTO THE SKIN DAILY 04/24/20   Pleas Koch, NP  loperamide (IMODIUM A-D) 2 MG tablet Take 2 mg by mouth 4 (four) times daily as needed for diarrhea or loose stools.    [provider]  losartan (COZAAR) 100 MG tablet Take 1 tablet (100 mg total) by mouth daily. For blood pressure. 12/20/19   Pleas Koch, NP  metFORMIN (GLUCOPHAGE) 1000 MG tablet Take 1 tablet (1,000 mg total) by mouth 2 (two) times daily with a meal. For diabetes. 12/20/19   Pleas Koch, NP  oxyCODONE (OXY IR/ROXICODONE) 5 MG immediate release tablet Take 5 mg by mouth 4 (four) times daily as needed. 10/24/16   [provider]  pantoprazole (PROTONIX) 20 MG tablet Take 1 tablet (20 mg total) by mouth daily. 03/16/18   Pleas Koch, NP  promethazine (PHENERGAN) 25 MG tablet Take 25 mg by mouth every 6 (six) hours as needed. Reported on 09/21/2015 07/03/15   [provider]  venlafaxine XR (EFFEXOR XR) 37.5 MG 24 hr capsule Take 1 capsule (37.5 mg total) by mouth daily with breakfast. For anxiety and depression. 12/20/19   Pleas Koch, NP    Family History Family History  Problem Relation Age of Onset  . Colon cancer Mother   . Lung cancer Mother   . Skin cancer Mother   . Heart disease Father   . Diabetes Father   . Skin cancer Father   . Breast cancer Neg Hx     Social History Social History   Tobacco Use  . Smoking status: Never Smoker  . Smokeless tobacco: Never Used  Substance Use Topics  . Alcohol use: Yes    Alcohol/week: 0.0 standard drinks    Comment: rarely  . Drug use: No     Allergies   Patient has no known  allergies.   Review of Systems Review of Systems  Constitutional: Negative for chills and fever.  HENT: Negative for ear pain and sore throat.   Eyes: Negative for pain and visual disturbance.  Respiratory: Positive for cough and shortness of breath.   Cardiovascular: Negative for chest pain and palpitations.  Gastrointestinal: Negative for abdominal pain and vomiting.  Genitourinary: Negative for dysuria and hematuria.  Musculoskeletal: Negative for arthralgias and back pain.  Skin: Negative for color change and rash.  Neurological: Negative for seizures and syncope.  All other systems reviewed and are negative.    Physical Exam Triage Vital  Signs ED Triage Vitals  Enc Vitals Group     BP      Pulse      Resp      Temp      Temp src      SpO2      Weight      Height      Head Circumference      Peak Flow      Pain Score      Pain Loc      Pain Edu?      Excl. in [place]?    No data found.  Updated Vital Signs BP 110/60   Pulse 89   Temp 97.8 F (36.6 C)   Resp 20   SpO2 94%   Visual Acuity Right Eye Distance:   Left Eye Distance:   Bilateral Distance:    Right Eye Near:   Left Eye Near:    Bilateral Near:     Physical Exam Vitals and nursing note reviewed.  Constitutional:      General: She is not in acute distress.    Appearance: She is well-developed. She is obese. She is ill-appearing.  HENT:     Head: Normocephalic and atraumatic.     Mouth/Throat:     Mouth: Mucous membranes are moist.     Pharynx: Oropharynx is clear.  Eyes:     Conjunctiva/sclera: Conjunctivae normal.  Cardiovascular:     Rate and Rhythm: Normal rate and regular rhythm.     Heart sounds: No murmur heard.   Pulmonary:     Effort: Pulmonary effort is normal. No respiratory distress.     Breath sounds: Normal breath sounds. No wheezing.     Comments: Lung sounds diminished. Abdominal:     Palpations: Abdomen is soft.     Tenderness: There is no abdominal tenderness.      Hernia: No hernia is present.  Musculoskeletal:     Cervical back: Neck supple.  Skin:    General: Skin is warm and dry.     Findings: No rash.  Neurological:     General: No focal deficit present.     Mental Status: She is alert and oriented to person, place, and time.     Gait: Gait normal.  Psychiatric:        Mood and Affect: Mood normal.        Behavior: Behavior normal.      UC Treatments / Results  Labs (all labs ordered are listed, but only abnormal results are displayed) Labs Reviewed - No data to display  EKG   Radiology DG Chest 2 View  Result Date: 06/02/2020 CLINICAL DATA:  Shortness of breath, COVID positive EXAM: CHEST - 2 VIEW COMPARISON:  Chest radiograph dated 01/23/2018. FINDINGS: The heart size and mediastinal contours are within normal limits. There are mild airspace opacities in the left lung base. This appears similar to prior exam and may reflect a pericardial fat pad. There is no pleural effusion or pneumothorax. The right lung is clear. The visualized skeletal structures are unremarkable. IMPRESSION: Mild airspace opacities in the left lung base versus pericardial fat pad. Electronically Signed   By: Zerita Boers M.D.   On: 06/02/2020 14:00    Procedures Procedures (including critical care time)  Medications Ordered in UC Medications - No data to display  Initial Impression / Assessment and Plan / UC Course  I have reviewed the triage vital signs and the nursing notes.  Pertinent labs & imaging  results that were available during my care of the patient were reviewed by me and considered in my medical decision making (see chart for details).   COVID-19, shortness of breath.  Patient tested COVID positive on 05/26/2020.   CXR shows "Mild airspace opacities in the left lung base versus pericardial fat Pad."; the results are "similar to prior exam."  Strict instructions for going to the ED if ongoing or worsening symptoms.  Instructed patient to follow  up with her PCP.  Patient agrees to plan of care.      Final Clinical Impressions(s) / UC Diagnoses   Final diagnoses:  COVID-19  SOB (shortness of breath)     Discharge Instructions     Go to  [facility] for your chest x-ray.  I will call you with the results this afternoon.    Go to the emergency department if you have acute worsening symptoms.        ED Prescriptions    None     PDMP not reviewed this encounter.   Tate, Kelly H, NP 06/02/20 1409  

## 2020-06-02 NOTE — Patient Instructions (Signed)
Go to the Urgent Care now.  It was a pleasure to see you today! Allie Bossier, NP-C

## 2020-06-02 NOTE — Discharge Instructions (Signed)
Go to [facility] for your chest x-ray.  I will call you with the results this afternoon.    Go to the emergency department if you have acute worsening symptoms.

## 2020-06-02 NOTE — Assessment & Plan Note (Signed)
Positive for Covid-19, seems to be in moderate distress. Unfortunately she is out of the window for monoclonal antibody treatment as today is day 10 of symptoms.  I recommended that she proceed to an Urgent Care for xray, vital signs, and an exam. She agrees. She will proceed to either [facility] or [facility].

## 2020-06-02 NOTE — Progress Notes (Addendum)
Subjective:    Patient ID: Danielle Edwards, female    DOB: 07/25/1956, 64 y.o.   MRN: [id number]  HPI  This visit occurred during the SARS-CoV-2 public health emergency.  Safety protocols were in place, including screening questions prior to the visit, additional usage of staff PPE, and extensive cleaning of exam room while observing appropriate contact time as indicated for disinfecting solutions.   Virtual Visit via Video Note  I connected with Danielle Edwards on 06/02/20 at  9:20 AM EDT by a video enabled telemedicine application and verified that I am speaking with the correct person using two identifiers.  Location: Patient: Home Provider: Office Participants: Patient and myself.   I discussed the limitations of evaluation and management by telemedicine and the availability of in person appointments. The patient expressed understanding and agreed to proceed.  History of Present Illness:  Danielle Edwards is a 64 year old female with a medical history of type 2 diabetes, hypertension, hypothyroidism, anemia, obesity, chronic pain, CKD who presents today to discuss Covid-19 symptoms.   Evaluated by Danielle Edwards on 05/25/20 for symptoms of cough, rhinorrhea, diarrhea that began three days prior. Had been exposed to other people with same symptoms, she had Covid-19 vaccine, [employer]. She presented for Covid-19 testing, was notified of her positive result on 05/27/20.  Since her diagnosis of Covid-19 she's feeling sick. She endorses that symptoms began on August 21st (10 days ago). Symptoms now include cough with production of thick mucous, nasal congestion with thick mucous, diarrhea, shortness of breath, heart palpitations.   She's taking Hycodan cough suppressant without improvement, now she feels that her symptoms are getting worse.    Observations/Objective:  Alert and oriented. Appears tired and sickly. No distress. Speaking in complete sentences. Dry cough during exam.  Assessment and  Plan:  Positive for Covid-19, seems to be in moderate distress. Unfortunately she is out of the window for monoclonal antibody treatment as today is day 10 of symptoms.  I recommended that she proceed to an Urgent Care for xray, vital signs, and an exam. She agrees. She will proceed to either [facility] or [facility].   Follow Up Instructions:  Go to the Urgent Care now.  It was a pleasure to see you today! Danielle Bossier, NP-C    I discussed the assessment and treatment plan with the patient. The patient was provided an opportunity to ask questions and all were answered. The patient agreed with the plan and demonstrated an understanding of the instructions.   The patient was advised to call back or seek an in-person evaluation if the symptoms worsen or if the condition fails to improve as anticipated.    Danielle Koch, NP    Review of Systems  Constitutional: Positive for fatigue. Negative for chills and fever.  HENT: Positive for congestion and sinus pressure.   Respiratory: Positive for cough and shortness of breath.   Cardiovascular: Negative for chest pain.  Gastrointestinal: Positive for diarrhea.  Allergic/Immunologic: Negative for environmental allergies.       Past Medical History:  Diagnosis Date  . Anemia    Iron defiency   . Chronic pain   . Diabetes type 2, controlled ([place])   . Headache   . History of colonic polyps 1999   Benign  . Hx of adenomatous polyp of colon 09/27/2000   2001 - diminutive adenoma [place] 2007 no polyps - [place]  . Hypertension   . Hypothyroidism   . IBS (irritable bowel syndrome)  Diarrhea type  . Vitamin D deficiency      Social History   Socioeconomic History  . Marital status: Married    Spouse name: Not on file  . Number of children: Not on file  . Years of education: Not on file  . Highest education level: Not on file  Occupational History  . Not on file  Tobacco Use  . Smoking status: Never Smoker  .  Smokeless tobacco: Never Used  Substance and Sexual Activity  . Alcohol use: Yes    Alcohol/week: 0.0 standard drinks    Comment: rarely  . Drug use: No  . Sexual activity: Not on file  Other Topics Concern  . Not on file  Social History Narrative   Married.   3 children.   Retired, worked at [employer] in [place], [place].   Enjoys being with her grandchildren, being with her friends.   Social Determinants of Health   Financial Resource Strain:   . Difficulty of Paying Living Expenses: Not on file  Food Insecurity:   . Worried About Charity fundraiser in the Last Year: Not on file  . Ran Out of Food in the Last Year: Not on file  Transportation Needs:   . Lack of Transportation (Medical): Not on file  . Lack of Transportation (Non-Medical): Not on file  Physical Activity:   . Days of Exercise per Week: Not on file  . Minutes of Exercise per Session: Not on file  Stress:   . Feeling of Stress : Not on file  Social Connections:   . Frequency of Communication with Friends and Family: Not on file  . Frequency of Social Gatherings with Friends and Family: Not on file  . Attends Religious Services: Not on file  . Active Member of Clubs or Organizations: Not on file  . Attends Archivist Meetings: Not on file  . Marital Status: Not on file  Intimate Partner Violence:   . Fear of Current or Ex-Partner: Not on file  . Emotionally Abused: Not on file  . Physically Abused: Not on file  . Sexually Abused: Not on file    Past Surgical History:  Procedure Laterality Date  . CHOLECYSTECTOMY  1995  . COLONOSCOPY  multiple  . NASAL SINUS SURGERY  2001  . TONSILLECTOMY AND ADENOIDECTOMY    . VAGINAL HYSTERECTOMY     Complete    Family History  Problem Relation Age of Onset  . Colon cancer Mother   . Lung cancer Mother   . Skin cancer Mother   . Heart disease Father   . Diabetes Father   . Skin cancer Father   . Breast cancer Neg Hx     No Known Allergies  Current  Outpatient Medications on File Prior to Visit  Medication Sig Dispense Refill  . Accu-Chek FastClix Lancets MISC USE AS INSTRUCTED TO CHECK BLOOD SUGAR UP TO 3 TIMES DAILY 306 each 1  . amLODipine (NORVASC) 10 MG tablet Take 1 tablet (10 mg total) by mouth daily. For blood pressure. 90 tablet 3  . atorvastatin (LIPITOR) 40 MG tablet Take 1 tablet (40 mg total) by mouth at bedtime. For cholesterol. 90 tablet 3  . blood glucose meter kit and supplies KIT Dispense based on patient and insurance preference. Use up to four times daily as directed. (FOR ICD-9 250.00, 250.01). 1 each 0  . Blood Glucose Monitoring Suppl (ACCU-CHEK AVIVA PLUS) w/Device KIT Use as instructed to test blood sugar 3 times daily  1 kit 0  . Continuous Blood Gluc Receiver (FREESTYLE LIBRE 14 DAY READER) DEVI 1 Device by Does not apply route 3 (three) times daily. Dx E11.9 1 each 0  . Continuous Blood Gluc Sensor (FREESTYLE LIBRE 14 DAY SENSOR) MISC 1 Device by Does not apply route 3 (three) times daily. Dx E11.9 2 each 5  . glimepiride (AMARYL) 4 MG tablet TAKE 2 TABLETS BY MOUTH ONCE DAILY WITH BREAKFAST FOR DIABETES 180 tablet 3  . glucose blood (ACCU-CHEK AVIVA) test strip Use as instructed to test blood sugar up to 3 times daily 300 each 1  . HYDROcodone-homatropine (HYCODAN) 5-1.5 MG/5ML syrup Take 5 mLs by mouth every 8 (eight) hours as needed for cough. 120 mL 0  . Insulin Pen Needle 32G X 6 MM MISC Use as instructed to inject insulin daily at bedtime 100 each 3  . LANTUS SOLOSTAR 100 UNIT/ML Solostar Pen INJECT 30 UNITS INTO THE SKIN DAILY 15 mL 0  . loperamide (IMODIUM A-D) 2 MG tablet Take 2 mg by mouth 4 (four) times daily as needed for diarrhea or loose stools.    Marland Kitchen losartan (COZAAR) 100 MG tablet Take 1 tablet (100 mg total) by mouth daily. For blood pressure. 90 tablet 3  . metFORMIN (GLUCOPHAGE) 1000 MG tablet Take 1 tablet (1,000 mg total) by mouth 2 (two) times daily with a meal. For diabetes. 180 tablet 3  .  oxyCODONE (OXY IR/ROXICODONE) 5 MG immediate release tablet Take 5 mg by mouth 4 (four) times daily as needed.    . pantoprazole (PROTONIX) 20 MG tablet Take 1 tablet (20 mg total) by mouth daily. 90 tablet 0  . promethazine (PHENERGAN) 25 MG tablet Take 25 mg by mouth every 6 (six) hours as needed. Reported on 09/21/2015  1  . venlafaxine XR (EFFEXOR XR) 37.5 MG 24 hr capsule Take 1 capsule (37.5 mg total) by mouth daily with breakfast. For anxiety and depression. 90 capsule 3   No current facility-administered medications on file prior to visit.    Ht _0  (1.676 m)   Wt 274 lb (124.3 kg)   BMI 44.22 kg/m    Objective:   Physical Exam Constitutional:      Appearance: She is ill-appearing.  Pulmonary:     Effort: Pulmonary effort is normal.     Comments: Dry cough during exam Neurological:     Mental Status: She is alert and oriented to person, place, and time.  Psychiatric:        Mood and Affect: Mood normal.            Assessment & Plan:

## 2020-06-03 ENCOUNTER — Telehealth: Payer: Self-pay

## 2020-06-03 ENCOUNTER — Other Ambulatory Visit: Payer: Self-pay | Admitting: Oncology

## 2020-06-03 ENCOUNTER — Encounter: Payer: Self-pay | Admitting: Oncology

## 2020-06-03 DIAGNOSIS — U071 COVID-19: Secondary | ICD-10-CM

## 2020-06-03 NOTE — Progress Notes (Signed)
I connected by phone with  Danielle Edwards to discuss the potential use of an new treatment for mild to moderate COVID-19 viral infection in non-hospitalized patients.   This patient is a age/sex that meets the FDA criteria for Emergency Use Authorization of casirivimab\imdevimab.  Has a (+) direct SARS-CoV-2 viral test result 1. Has mild or moderate COVID-19  2. Is ? 65 years of age and weighs ? 40 kg 3. Is NOT hospitalized due to COVID-19 4. Is NOT requiring oxygen therapy or requiring an increase in baseline oxygen flow rate due to COVID-19 5. Is within 10 days of symptom onset 6. Has at least one of the high risk factor(s) for progression to severe COVID-19 and/or hospitalization as defined in EUA. ? Specific high risk criteria :age- obesity   Symptom onset  05/26/20   I have spoken and communicated the following to the patient or parent/caregiver:   1. FDA has authorized the emergency use of casirivimab\imdevimab for the treatment of mild to moderate COVID-19 in adults and pediatric patients with positive results of direct SARS-CoV-2 viral testing who are 74 years of age and older weighing at least 40 kg, and who are at high risk for progressing to severe COVID-19 and/or hospitalization.   2. The significant known and potential risks and benefits of casirivimab\imdevimab, and the extent to which such potential risks and benefits are unknown.   3. Information on available alternative treatments and the risks and benefits of those alternatives, including clinical trials.   4. Patients treated with casirivimab\imdevimab should continue to self-isolate and use infection control measures (e.g., wear mask, isolate, social distance, avoid sharing personal items, clean and disinfect "high touch" surfaces, and frequent handwashing) according to CDC guidelines.    5. The patient or parent/caregiver has the option to accept or refuse casirivimab\imdevimab .   After reviewing this information with the  patient, The patient agreed to proceed with receiving casirivimab\imdevimab infusion and will be provided a copy of the Fact sheet prior to receiving the infusion.Rulon Abide, AGNP-C [phone] ([place])

## 2020-06-03 NOTE — Telephone Encounter (Signed)
I recommend use of Flonase nasal spray for head congestion if she is already taking daily prednisone. Did she mention her breathing?  If she is short of breath we can consider an albuterol inhaler. Let me know.

## 2020-06-03 NOTE — Telephone Encounter (Signed)
Advised patient of Danielle Edwards response.  She says her oxygen has been about 94%.  Patient says her orthopedic doctor has her on 5mg  prednisone, so she is not sure a steroid would help her.  She does report use of OTC but didn't seem to help.

## 2020-06-03 NOTE — Telephone Encounter (Signed)
Pt was dx with Covid on 24th. Had xray yesterday due to breathing issues. She is having some upper respiratory sinus issues. Very congested in her sinuses. Asking if she could get an antibiotic to help clear this up. Please send to [facility]    Also, husband was signed up for antibody infusion. Pt would like to get infusion, also.  Please advise [phone]

## 2020-06-03 NOTE — Telephone Encounter (Signed)
Please notify patient that she does not meet criteria for monoclonal antibody infusion as she is outside of the window from symptom start date.  Her chest x-ray did not show evidence of COVID associated pneumonia, antibiotics are not warranted at this time.  How is her breathing?  Have her monitor her pulse oxygen saturation as discussed during her visit, if she sees 90% or below consistently and have her go to the emergency department.  We can try some oral steroids to help with sinus congestion/pressure and any shortness of breath.  She want to try this?

## 2020-06-04 ENCOUNTER — Ambulatory Visit (HOSPITAL_COMMUNITY)
Admission: RE | Admit: 2020-06-04 | Discharge: 2020-06-04 | Disposition: A | Discharge status: Home / Self Care | Payer: Medicare Other | Source: Ambulatory Visit | Attending: Pulmonary Disease | Admitting: Pulmonary Disease

## 2020-06-04 DIAGNOSIS — U071 COVID-19: Secondary | ICD-10-CM | POA: Insufficient documentation

## 2020-06-04 DIAGNOSIS — Z23 Encounter for immunization: Secondary | ICD-10-CM | POA: Diagnosis not present

## 2020-06-04 MED ORDER — SODIUM CHLORIDE 0.9 % IV SOLN: INTRAVENOUS | Status: DC | PRN

## 2020-06-04 MED ORDER — DIPHENHYDRAMINE HCL 50 MG/ML IJ SOLN: 50.0000 mg | Freq: Once | INTRAMUSCULAR | Status: DC | PRN

## 2020-06-04 MED ORDER — EPINEPHRINE 0.3 MG/0.3ML IJ SOAJ: 0.3000 mg | Freq: Once | INTRAMUSCULAR | Status: DC | PRN

## 2020-06-04 MED ORDER — FAMOTIDINE IN NACL 20-0.9 MG/50ML-% IV SOLN: 20.0000 mg | Freq: Once | INTRAVENOUS | Status: DC | PRN

## 2020-06-04 MED ORDER — SODIUM CHLORIDE 0.9 % IV SOLN
1200.0000 mg | Freq: Once | INTRAVENOUS | Status: AC
  Administered 2020-06-04: 1200 mg via INTRAVENOUS
  Filled 2020-06-04: qty 10

## 2020-06-04 MED ORDER — METHYLPREDNISOLONE SODIUM SUCC 125 MG IJ SOLR: 125.0000 mg | Freq: Once | INTRAMUSCULAR | Status: DC | PRN

## 2020-06-04 MED ORDER — ALBUTEROL SULFATE HFA 108 (90 BASE) MCG/ACT IN AERS: 2.0000 | INHALATION_SPRAY | Freq: Once | RESPIRATORY_TRACT | Status: DC | PRN

## 2020-06-04 NOTE — Telephone Encounter (Signed)
Patient is just complaining of cough from neck up.  She does have a sore throat.  Denies shortness of breath or need for inhaler.  Advised patient to use Flonase.

## 2020-06-04 NOTE — Discharge Instructions (Signed)

## 2020-06-04 NOTE — Progress Notes (Signed)
  Diagnosis: COVID-19  Physician: Dr. Joya Gaskins  Procedure: [facility]fusion [facility] Med: casirivimab\imdevimab infusion - Provided patient with casirivimab\imdevimab fact sheet for patients, parents and caregivers prior to infusion.  Complications: No immediate complications noted.  Discharge: Discharged home   Maretta Bees [facility] 06/04/2020

## 2020-06-05 MED ORDER — FLUTICASONE PROPIONATE 50 MCG/ACT NA SUSP: 1.0000 | Freq: Two times a day (BID) | NASAL | 0 refills | Status: DC

## 2020-06-05 NOTE — Telephone Encounter (Signed)
Left detailed message for patient and advised to call back if any questions.

## 2020-06-05 NOTE — Telephone Encounter (Signed)
Blood pressure looks fine. I would hold BP meds if she sees readings that are at or less than 100/60. Based off of her recent chest xray, she does not have pneumonia, therefore no antibiotics are warranted. Covid-19 is a virus, not a bacteria, so antibiotics won't treat it.  Yes, I'll send in Flonase. Have her monitor O2 Sats, if at or below 90% then needs to go to the ED.

## 2020-06-05 NOTE — Telephone Encounter (Signed)
Pt also mention that her bp has been running low.  Danielle Edwards is was 113/60 Please advise

## 2020-06-05 NOTE — Addendum Note (Signed)
Addended by: Pleas Koch on: 06/05/2020 01:03 PM   Modules accepted: Orders

## 2020-06-05 NOTE — Telephone Encounter (Signed)
Pt called back stating she has done everything you suggested.  She stated she not any better.  She is having sinus pressure/headache right above eyebrow forhead/ ear pain sinus pain in cheeks/coughing/coughing up thick yellow green mucas and blowing out thick yellow green with a little blood mucas also No SOB   Decongestant over the counter and the cough meds you prescribed and flonase  She wants to know if you could call her in an antibodtic   She stated she is worse today   She wanted to know if she needs to see an ENT Dr.  Lowella Dandy  Can you call her in more flonase

## 2020-06-05 NOTE — Telephone Encounter (Signed)
Please advsie if pt needs to still take bp meds

## 2020-06-10 ENCOUNTER — Telehealth (INDEPENDENT_AMBULATORY_CARE_PROVIDER_SITE_OTHER): Payer: Medicare HMO | Admitting: Primary Care

## 2020-06-10 ENCOUNTER — Other Ambulatory Visit: Payer: Self-pay

## 2020-06-10 ENCOUNTER — Encounter: Payer: Self-pay | Admitting: Primary Care

## 2020-06-10 VITALS — Ht 66.0 in | Wt 274.0 lb

## 2020-06-10 DIAGNOSIS — E119 Type 2 diabetes mellitus without complications: Secondary | ICD-10-CM | POA: Diagnosis not present

## 2020-06-10 DIAGNOSIS — E785 Hyperlipidemia, unspecified: Secondary | ICD-10-CM

## 2020-06-10 DIAGNOSIS — I10 Essential (primary) hypertension: Secondary | ICD-10-CM | POA: Diagnosis not present

## 2020-06-10 DIAGNOSIS — E039 Hypothyroidism, unspecified: Secondary | ICD-10-CM

## 2020-06-10 DIAGNOSIS — U071 COVID-19: Secondary | ICD-10-CM

## 2020-06-10 NOTE — Assessment & Plan Note (Signed)
Compliant to atorvastatin, repeat lipids pending.

## 2020-06-10 NOTE — Assessment & Plan Note (Signed)
No longer on levothyroxine, TSH pending.

## 2020-06-10 NOTE — Progress Notes (Signed)
Subjective:    Patient ID: Danielle Edwards, female    DOB: Feb 06, 1956, 64 y.o.   MRN: [id number]  HPI  Virtual Visit via Video Note  I connected with Danielle Edwards on 06/10/20 at 11:20 AM EDT by a video enabled telemedicine application and verified that I am speaking with the correct person using two identifiers.  Location: Patient: Home Provider: Office   I discussed the limitations of evaluation and management by telemedicine and the availability of in person appointments. The patient expressed understanding and agreed to proceed.  History of Present Illness:  Ms. Vanpelt is a 64 year old female with a history of hypertension, type 2 diabetes, hypothyroidism, CKD stage 3, hyperlipidemia, Covid-19 infection who presents today for follow up of diabetes and Covid-19.  1) Covid-19: Diagnosed with Covid-19 on 05/26/20. She presented to [facility] on 06/02/20, chest xray did not show evidence of pneumonia. She's undergone monoclonal antibody infusion on 06/05/20. Overall she's doing much better. Is working on drinking Protein drinks and yogurt during the day as her appetite has been diminished and sore throat.    2) Type 2 Diabetes:  Current medications include: Lantus 30 units, Metformin 1000 mg BID.  She is checking her blood glucose 0 times daily since she's been sick with Covid-19.   Last A1C: 9.9 in March 2021, due now. Last Eye Exam: Due Last Foot Exam: Due Pneumonia Vaccination: Due ACE/ARB: Losartan  Statin: Lipitor  BP Readings from Last 3 Encounters:  06/04/20 (!) 113/54  06/02/20 110/60  12/20/19 130/86      Observations/Objective:  Alert and oriented. Appears well, not sickly. No distress. Speaking in complete sentences. No cough during visit.  Assessment and Plan:  See problem based charting.   Follow Up Instructions:  Call the main office line to schedule a nurse visit for blood pressure and weight check, and a lab appointment.  Start  checking your blood sugar levels.  Appropriate times to check your blood sugar levels are:  -Before any meal (breakfast, lunch, dinner) -Two hours after any meal (breakfast, lunch, dinner) -Bedtime  I will be in touch with results soon! It was a pleasure to see you today! Allie Bossier, NP-C    I discussed the assessment and treatment plan with the patient. The patient was provided an opportunity to ask questions and all were answered. The patient agreed with the plan and demonstrated an understanding of the instructions.   The patient was advised to call back or seek an in-person evaluation if the symptoms worsen or if the condition fails to improve as anticipated.    Pleas Koch, NP    Review of Systems  Constitutional: Negative for chills and fever.  HENT: Positive for sore throat.   Respiratory: Positive for shortness of breath. Negative for cough.   Cardiovascular: Negative for chest pain.  Gastrointestinal: Negative for nausea.  Neurological: Negative for dizziness and headaches.       Past Medical History:  Diagnosis Date  . Anemia    Iron defiency   . Chronic pain   . Diabetes type 2, controlled ([place])   . Headache   . History of colonic polyps 1999   Benign  . Hx of adenomatous polyp of colon 09/27/2000   2001 - diminutive adenoma [place] 2007 no polyps - [place]  . Hypertension   . Hypothyroidism   . IBS (irritable bowel syndrome)    Diarrhea type  . Vitamin D deficiency      Social History  Socioeconomic History  . Marital status: Married    Spouse name: Not on file  . Number of children: Not on file  . Years of education: Not on file  . Highest education level: Not on file  Occupational History  . Not on file  Tobacco Use  . Smoking status: Never Smoker  . Smokeless tobacco: Never Used  Substance and Sexual Activity  . Alcohol use: Yes    Alcohol/week: 0.0 standard drinks    Comment: rarely  . Drug use: No  . Sexual activity: Not  on file  Other Topics Concern  . Not on file  Social History Narrative   Married.   3 children.   Retired, worked at [employer] in [place], [place].   Enjoys being with her grandchildren, being with her friends.   Social Determinants of Health   Financial Resource Strain:   . Difficulty of Paying Living Expenses: Not on file  Food Insecurity:   . Worried About Charity fundraiser in the Last Year: Not on file  . Ran Out of Food in the Last Year: Not on file  Transportation Needs:   . Lack of Transportation (Medical): Not on file  . Lack of Transportation (Non-Medical): Not on file  Physical Activity:   . Days of Exercise per Week: Not on file  . Minutes of Exercise per Session: Not on file  Stress:   . Feeling of Stress : Not on file  Social Connections:   . Frequency of Communication with Friends and Family: Not on file  . Frequency of Social Gatherings with Friends and Family: Not on file  . Attends Religious Services: Not on file  . Active Member of Clubs or Organizations: Not on file  . Attends Archivist Meetings: Not on file  . Marital Status: Not on file  Intimate Partner Violence:   . Fear of Current or Ex-Partner: Not on file  . Emotionally Abused: Not on file  . Physically Abused: Not on file  . Sexually Abused: Not on file    Past Surgical History:  Procedure Laterality Date  . CHOLECYSTECTOMY  1995  . COLONOSCOPY  multiple  . NASAL SINUS SURGERY  2001  . TONSILLECTOMY AND ADENOIDECTOMY    . VAGINAL HYSTERECTOMY     Complete    Family History  Problem Relation Age of Onset  . Colon cancer Mother   . Lung cancer Mother   . Skin cancer Mother   . Heart disease Father   . Diabetes Father   . Skin cancer Father   . Breast cancer Neg Hx     No Known Allergies  Current Outpatient Medications on File Prior to Visit  Medication Sig Dispense Refill  . Accu-Chek FastClix Lancets MISC USE AS INSTRUCTED TO CHECK BLOOD SUGAR UP TO 3 TIMES DAILY 306 each  1  . amLODipine (NORVASC) 10 MG tablet Take 1 tablet (10 mg total) by mouth daily. For blood pressure. 90 tablet 3  . atorvastatin (LIPITOR) 40 MG tablet Take 1 tablet (40 mg total) by mouth at bedtime. For cholesterol. 90 tablet 3  . blood glucose meter kit and supplies KIT Dispense based on patient and insurance preference. Use up to four times daily as directed. (FOR ICD-9 250.00, 250.01). 1 each 0  . Blood Glucose Monitoring Suppl (ACCU-CHEK AVIVA PLUS) w/Device KIT Use as instructed to test blood sugar 3 times daily 1 kit 0  . Continuous Blood Gluc Receiver (FREESTYLE LIBRE 14 DAY READER) DEVI 1  Device by Does not apply route 3 (three) times daily. Dx E11.9 1 each 0  . Continuous Blood Gluc Sensor (FREESTYLE LIBRE 14 DAY SENSOR) MISC 1 Device by Does not apply route 3 (three) times daily. Dx E11.9 2 each 5  . fluticasone (FLONASE) 50 MCG/ACT nasal spray Place 1 spray into both nostrils 2 (two) times daily. 16 g 0  . glucose blood (ACCU-CHEK AVIVA) test strip Use as instructed to test blood sugar up to 3 times daily 300 each 1  . Insulin Pen Needle 32G X 6 MM MISC Use as instructed to inject insulin daily at bedtime 100 each 3  . LANTUS SOLOSTAR 100 UNIT/ML Solostar Pen INJECT 30 UNITS INTO THE SKIN DAILY 15 mL 0  . loperamide (IMODIUM A-D) 2 MG tablet Take 2 mg by mouth 4 (four) times daily as needed for diarrhea or loose stools.    Marland Kitchen losartan (COZAAR) 100 MG tablet Take 1 tablet (100 mg total) by mouth daily. For blood pressure. 90 tablet 3  . metFORMIN (GLUCOPHAGE) 1000 MG tablet Take 1 tablet (1,000 mg total) by mouth 2 (two) times daily with a meal. For diabetes. 180 tablet 3  . oxyCODONE (OXY IR/ROXICODONE) 5 MG immediate release tablet Take 5 mg by mouth 4 (four) times daily as needed.    . pantoprazole (PROTONIX) 20 MG tablet Take 1 tablet (20 mg total) by mouth daily. 90 tablet 0  . predniSONE (DELTASONE) 5 MG tablet Take by mouth 3 (three) times daily.    . promethazine (PHENERGAN) 25  MG tablet Take 25 mg by mouth every 6 (six) hours as needed. Reported on 09/21/2015  1  . venlafaxine XR (EFFEXOR XR) 37.5 MG 24 hr capsule Take 1 capsule (37.5 mg total) by mouth daily with breakfast. For anxiety and depression. 90 capsule 3   No current facility-administered medications on file prior to visit.    Ht _0  (1.676 m)   Wt 274 lb (124.3 kg)   BMI 44.22 kg/m    Objective:   Physical Exam Constitutional:      Appearance: She is not ill-appearing.  Pulmonary:     Effort: Pulmonary effort is normal.     Comments: No cough during visit Neurological:     Mental Status: She is alert and oriented to person, place, and time.  Psychiatric:        Mood and Affect: Mood normal.            Assessment & Plan:

## 2020-06-10 NOTE — Assessment & Plan Note (Signed)
Appears much better, completed monoclonal antibody treatment last week. No cough during visit.  Recommended Booster Covid-19 vaccine when applicable.

## 2020-06-10 NOTE — Assessment & Plan Note (Signed)
Unclear update as she is not checking glucose levels. Compliant to Lantus and Metformin. Strongly advised she check glucose levels while on insulin, she understands and will start checking.  Repeat A1C pending. Await results.

## 2020-06-11 ENCOUNTER — Ambulatory Visit (INDEPENDENT_AMBULATORY_CARE_PROVIDER_SITE_OTHER): Payer: Medicare HMO

## 2020-06-11 ENCOUNTER — Other Ambulatory Visit (INDEPENDENT_AMBULATORY_CARE_PROVIDER_SITE_OTHER): Payer: Medicare Other

## 2020-06-11 ENCOUNTER — Other Ambulatory Visit: Payer: Self-pay

## 2020-06-11 VITALS — BP 128/78 | HR 71 | Wt 254.2 lb

## 2020-06-11 DIAGNOSIS — E785 Hyperlipidemia, unspecified: Secondary | ICD-10-CM | POA: Diagnosis not present

## 2020-06-11 DIAGNOSIS — E119 Type 2 diabetes mellitus without complications: Secondary | ICD-10-CM | POA: Diagnosis not present

## 2020-06-11 DIAGNOSIS — E039 Hypothyroidism, unspecified: Secondary | ICD-10-CM

## 2020-06-11 DIAGNOSIS — I10 Essential (primary) hypertension: Secondary | ICD-10-CM

## 2020-06-11 NOTE — Progress Notes (Signed)
Per Allie Bossier, NP, encounter order on 06/10/20, patient presents today for a nurse visit blood pressure check for ongoing follow up and management.  Vital Sign Readings today BP Patient is BP 134/78 and 128/78, HR 77 and 71 per Allie Bossier, NP.  Weight 254lb, last in office weight was 274lb in March 2021. Pt did report she has not been taking atorvastatin but will be restarting. Pt verified taking all other meds. Pt is goint to check with insurance to see if they will pay for a continuous BG monitor and then f/u with office.   Advised pt to continue medications as prescribed. Congratulated pt on 20lb weight loss since March 2021.

## 2020-06-12 LAB — LIPID PANEL
Cholesterol: 165 mg/dL (ref 0–200)
HDL: 39.7 mg/dL (ref >39.00)
LDL Cholesterol: 95 mg/dL (ref 0–99)
NonHDL: 125.54
Total CHOL/HDL Ratio: 4
Triglycerides: 151 mg/dL — ABNORMAL HIGH (ref 0.0–149.0)
VLDL: 30.2 mg/dL (ref 0.0–40.0)

## 2020-06-12 LAB — COMPREHENSIVE METABOLIC PANEL
ALT: 31 U/L (ref 0–35)
AST: 29 U/L (ref 0–37)
Albumin: 4 g/dL (ref 3.5–5.2)
Alkaline Phosphatase: 77 U/L (ref 39–117)
BUN: 30 mg/dL — ABNORMAL HIGH (ref 6–23)
CO2: 24 mEq/L (ref 19–32)
Calcium: 10.7 mg/dL — ABNORMAL HIGH (ref 8.4–10.5)
Chloride: 104 mEq/L (ref 96–112)
Creatinine, Ser: 1.04 mg/dL (ref 0.40–1.20)
GFR: 53.15 mL/min — ABNORMAL LOW (ref >60.00)
Glucose, Bld: 122 mg/dL — ABNORMAL HIGH (ref 70–99)
Potassium: 4.5 mEq/L (ref 3.5–5.1)
Sodium: 138 mEq/L (ref 135–145)
Total Bilirubin: 0.4 mg/dL (ref 0.2–1.2)
Total Protein: 7.5 g/dL (ref 6.0–8.3)

## 2020-06-12 LAB — TSH: TSH: 2.29 u[IU]/mL (ref 0.35–4.50)

## 2020-06-12 LAB — HEMOGLOBIN A1C: Hgb A1c MFr Bld: 10.9 % — ABNORMAL HIGH (ref 4.6–6.5)

## 2020-06-13 ENCOUNTER — Telehealth: Payer: Self-pay

## 2020-06-13 DIAGNOSIS — E119 Type 2 diabetes mellitus without complications: Secondary | ICD-10-CM

## 2020-06-13 MED ORDER — LANTUS SOLOSTAR 100 UNIT/ML ~~LOC~~ SOPN: 35.0000 [IU] | PEN_INJECTOR | Freq: Every day | SUBCUTANEOUS | 1 refills | Status: DC

## 2020-06-13 NOTE — Telephone Encounter (Signed)
Informed patient of results and recommendations.  New lantus prescription sent with new direction.  She had questions about the [place] monitor. How does she get this?

## 2020-06-13 NOTE — Telephone Encounter (Signed)
-----   Message from Pleas Koch, NP sent at 06/12/2020  4:25 PM EDT ----- Please call patient:  Thyroid, cholesterol look okay.  Diabetes is uncontrolled, worse than last visit. She must start checking her blood sugars at least twice daily. I would like to increase her Lantus insulin to 35 units daily.  I would also like to see her back either virtually or in person for diabetes follow-up in 1 month.  She needs to have glucose logs ready.

## 2020-06-14 MED ORDER — FREESTYLE LIBRE 14 DAY READER DEVI: 1.0000 | Freq: Three times a day (TID) | 0 refills | Status: DC

## 2020-06-14 MED ORDER — FREESTYLE LIBRE 14 DAY SENSOR MISC: 1.0000 | Freq: Three times a day (TID) | 5 refills | Status: DC

## 2020-06-14 NOTE — Telephone Encounter (Signed)
Prescription refilled for both reader and device.

## 2020-06-15 ENCOUNTER — Telehealth: Payer: Self-pay | Admitting: Primary Care

## 2020-06-15 NOTE — Telephone Encounter (Signed)
-----   Message from Randall An, RN sent at 06/11/2020  4:27 PM EDT ----- Advised pt to continue current medications. Pt did reveal she has not been taking atorvastatin but is going to restart it.

## 2020-06-15 NOTE — Telephone Encounter (Signed)
Noted, BP and weight improved. Resume atorvastatin.

## 2020-06-26 ENCOUNTER — Encounter: Payer: Self-pay | Admitting: Gastroenterology

## 2020-06-26 ENCOUNTER — Other Ambulatory Visit: Payer: Self-pay

## 2020-06-26 ENCOUNTER — Ambulatory Visit (AMBULATORY_SURGERY_CENTER): Payer: Self-pay

## 2020-06-26 VITALS — Ht 66.0 in | Wt 253.0 lb

## 2020-06-26 DIAGNOSIS — Z800 Family history of malignant neoplasm of digestive organs: Secondary | ICD-10-CM

## 2020-06-26 DIAGNOSIS — Z86010 Personal history of colonic polyps: Secondary | ICD-10-CM

## 2020-06-26 NOTE — Progress Notes (Signed)
No egg or soy allergy known to patient  No issues with past sedation with any surgeries or procedures No intubation problems in the past  No FH of Malignant Hyperthermia No diet pills per patient No home 02 use per patient  No blood thinners per patient  Pt denies issues with constipation  No A fib or A flutter  EMMI video via [place] 19 guidelines implemented in PV today with Pt and RN  Coupon given to pt in PV today , Code to Pharmacy  COVID vaccines completed on 12/2019 per pt; Patient has requested her prep be changed to Miralax due to having Miralax for her last procedure Due to the COVID-19 pandemic we are asking patients to follow these guidelines. Please only bring one care partner. Please be aware that your care partner may wait in the car in the parking lot or if they feel like they will be too hot to wait in the car, they may wait in the lobby on the 4th floor. All care partners are required to wear a mask the entire time (we do not have any that we can provide them), they need to practice social distancing, and we will do a Covid check for all patient's and care partners when you arrive. Also we will check their temperature and your temperature. If the care partner waits in their car they need to stay in the parking lot the entire time and we will call them on their cell phone when the patient is ready for discharge so they can bring the car to the front of the building. Also all patient's will need to wear a mask into building.

## 2020-07-07 ENCOUNTER — Encounter: Payer: Self-pay | Admitting: Internal Medicine

## 2020-07-07 ENCOUNTER — Telehealth: Payer: Self-pay | Admitting: Primary Care

## 2020-07-07 NOTE — Telephone Encounter (Signed)
Pt is scheduled for jury duty on Thursday, July 09, 2020 (she thought it was in November) and requested to be excused due to medical reasons.  They have responded to her and stated that she will be approved if she has a dr's excuse.  She wants to know if you can provide this for her and it needs to be sent today.  She also asked that I send a copy of this to you Rollene Fare since you treated her in August of this year.  Please advise.  Pt requests c/b.

## 2020-07-07 NOTE — Telephone Encounter (Signed)
Letter done for patient. Called and let know she can get off my chart. She did not need to have picked up at this time. If any issues she will let our office. Know.

## 2020-07-07 NOTE — Telephone Encounter (Signed)
Had covid 06/2020, ok for excuse for jury duty

## 2020-07-09 DIAGNOSIS — M5382 Other specified dorsopathies, cervical region: Secondary | ICD-10-CM | POA: Diagnosis not present

## 2020-07-09 DIAGNOSIS — M5459 Other low back pain: Secondary | ICD-10-CM | POA: Diagnosis not present

## 2020-07-09 DIAGNOSIS — M5412 Radiculopathy, cervical region: Secondary | ICD-10-CM | POA: Diagnosis not present

## 2020-07-09 DIAGNOSIS — M5416 Radiculopathy, lumbar region: Secondary | ICD-10-CM | POA: Diagnosis not present

## 2020-07-10 ENCOUNTER — Encounter: Payer: Medicare HMO | Admitting: Internal Medicine

## 2020-07-15 ENCOUNTER — Ambulatory Visit: Payer: Medicare Other | Admitting: Primary Care

## 2020-07-17 ENCOUNTER — Other Ambulatory Visit: Payer: Self-pay

## 2020-07-17 ENCOUNTER — Ambulatory Visit (AMBULATORY_SURGERY_CENTER): Payer: Medicare HMO | Admitting: Gastroenterology

## 2020-07-17 ENCOUNTER — Encounter: Payer: Self-pay | Admitting: Gastroenterology

## 2020-07-17 VITALS — BP 117/67 | HR 70 | Temp 98.0°F | Resp 14 | Ht 66.0 in | Wt 253.0 lb

## 2020-07-17 DIAGNOSIS — Z800 Family history of malignant neoplasm of digestive organs: Secondary | ICD-10-CM

## 2020-07-17 DIAGNOSIS — E1122 Type 2 diabetes mellitus with diabetic chronic kidney disease: Secondary | ICD-10-CM | POA: Diagnosis not present

## 2020-07-17 DIAGNOSIS — M797 Fibromyalgia: Secondary | ICD-10-CM | POA: Diagnosis not present

## 2020-07-17 DIAGNOSIS — I129 Hypertensive chronic kidney disease with stage 1 through stage 4 chronic kidney disease, or unspecified chronic kidney disease: Secondary | ICD-10-CM | POA: Diagnosis not present

## 2020-07-17 DIAGNOSIS — Z86010 Personal history of colonic polyps: Secondary | ICD-10-CM | POA: Diagnosis not present

## 2020-07-17 DIAGNOSIS — D120 Benign neoplasm of cecum: Secondary | ICD-10-CM

## 2020-07-17 DIAGNOSIS — D122 Benign neoplasm of ascending colon: Secondary | ICD-10-CM | POA: Diagnosis not present

## 2020-07-17 DIAGNOSIS — N183 Chronic kidney disease, stage 3 unspecified: Secondary | ICD-10-CM | POA: Diagnosis not present

## 2020-07-17 DIAGNOSIS — D124 Benign neoplasm of descending colon: Secondary | ICD-10-CM

## 2020-07-17 MED ORDER — SODIUM CHLORIDE 0.9 % IV SOLN: 500.0000 mL | Freq: Once | INTRAVENOUS | Status: DC

## 2020-07-17 NOTE — Op Note (Signed)
[facility] Patient Name: Danielle Edwards Procedure Date: 07/17/2020 12:56 PM MRN: [id number] Endoscopist: Thornton Park MD, MD Age: 64 Referring MD:  Date of Birth: 13-May-1956 Gender: Female Account #: [id number] Procedure:                Colonoscopy Indications:              Surveillance: Personal history of adenomatous                            polyps on last colonoscopy 5 years ago                           Brother with colon polyps, mother with colon cancer                            in her 32s                           Normal colonoscopy 2007                           3 tubular adenomas on colonoscopy 2016 with Dr.                            Carlean Purl Medicines:                Monitored Anesthesia Care Procedure:                Pre-Anesthesia Assessment:                           - Prior to the procedure, a History and Physical                            was performed, and patient medications and                            allergies were reviewed. The patient's tolerance of                            previous anesthesia was also reviewed. The risks                            and benefits of the procedure and the sedation                            options and risks were discussed with the patient.                            All questions were answered, and informed consent                            was obtained. Prior Anticoagulants: The patient has                            taken no previous anticoagulant  or antiplatelet                            agents. ASA Grade Assessment: III - A patient with                            severe systemic disease. After reviewing the risks                            and benefits, the patient was deemed in                            satisfactory condition to undergo the procedure.                           After obtaining informed consent, the colonoscope                            was passed under direct vision. Throughout the                             procedure, the patient's blood pressure, pulse, and                            oxygen saturations were monitored continuously. The                            Colonoscope was introduced through the anus and                            advanced to the 3 cm into the ileum. The                            colonoscopy was performed without difficulty. The                            patient tolerated the procedure well. The quality                            of the bowel preparation was good. The terminal                            ileum, ileocecal valve, appendiceal orifice, and                            rectum were photographed. Scope In: 1:48:02 PM Scope Out: 2:07:48 PM Scope Withdrawal Time: 0 hours 16 minutes 34 seconds  Total Procedure Duration: 0 hours 19 minutes 46 seconds  Findings:                 The perianal and digital rectal examinations were                            normal.  A 1 mm polyp was found in the ascending colon. The                            polyp was flat. The polyp was removed with a cold                            snare. Resection and retrieval were complete.                            Estimated blood loss was minimal.                           A 2 mm polyp was found in the cecum. The polyp was                            flat. The polyp was removed with a cold snare.                            Resection and retrieval were complete. Estimated                            blood loss was minimal.                           Two sessile polyps were found in the ascending                            colon. The polyps were 8 to 10 mm in size. These                            polyps were removed with a cold snare. Resection                            and retrieval were complete. Estimated blood loss                            was minimal.                           A 2 mm polyp was found in the descending colon. The                             polyp was sessile. The polyp was removed with a                            cold snare. Resection and retrieval were complete.                            Estimated blood loss was minimal.                           The exam was otherwise without abnormality on  direct and retroflexion views. Complications:            No immediate complications. Estimated blood loss:                            Minimal. Estimated Blood Loss:     Estimated blood loss was minimal. Impression:               - One 1 mm polyp in the ascending colon, removed                            with a cold snare. Resected and retrieved.                           - One 2 mm polyp in the cecum, removed with a cold                            snare. Resected and retrieved.                           - Two 8 to 10 mm polyps in the ascending colon,                            removed with a cold snare. Resected and retrieved.                           - One 2 mm polyp in the descending colon, removed                            with a cold snare. Resected and retrieved.                           - The examination was otherwise normal on direct                            and retroflexion views. Recommendation:           - Patient has a contact number available for                            emergencies. The signs and symptoms of potential                            delayed complications were discussed with the                            patient. Return to normal activities tomorrow.                            Written discharge instructions were provided to the                            patient.                           -  Resume previous diet.                           - Continue present medications.                           - Await pathology results.                           - Repeat colonoscopy in 3 years for surveillance if                            at least 3 polyps are adenomas.                            - Emerging evidence supports eating a diet of                            fruits, vegetables, grains, calcium, and yogurt                            while reducing red meat and alcohol may reduce the                            risk of colon cancer.                           - Thank you for allowing me to be involved in your                            colon cancer prevention. Thornton Park MD, MD 07/17/2020 2:41:06 PM This report has been signed electronically.

## 2020-07-17 NOTE — Progress Notes (Signed)
Called to room to assist during endoscopic procedure.  Patient ID and intended procedure confirmed with present staff. Received instructions for my participation in the procedure from the performing physician.  

## 2020-07-17 NOTE — Progress Notes (Signed)
Pt's states no medical or surgical changes since previsit or office visit. VS by MO 

## 2020-07-17 NOTE — Patient Instructions (Signed)
Handout given for polyps.  Await pathology results.  YOU HAD AN ENDOSCOPIC PROCEDURE TODAY AT THE [facility]:   Refer to the procedure report that was given to you for any specific questions about what was found during the examination.  If the procedure report does not answer your questions, please call your gastroenterologist to clarify.  If you requested that your care partner not be given the details of your procedure findings, then the procedure report has been included in a sealed envelope for you to review at your convenience later.  YOU SHOULD EXPECT: Some feelings of bloating in the abdomen. Passage of more gas than usual.  Walking can help get rid of the air that was put into your GI tract during the procedure and reduce the bloating. If you had a lower endoscopy (such as a colonoscopy or flexible sigmoidoscopy) you may notice spotting of blood in your stool or on the toilet paper. If you underwent a bowel prep for your procedure, you may not have a normal bowel movement for a few days.  Please Note:  You might notice some irritation and congestion in your nose or some drainage.  This is from the oxygen used during your procedure.  There is no need for concern and it should clear up in a day or so.  SYMPTOMS TO REPORT IMMEDIATELY:   Following lower endoscopy (colonoscopy or flexible sigmoidoscopy):  Excessive amounts of blood in the stool  Significant tenderness or worsening of abdominal pains  Swelling of the abdomen that is new, acute  Fever of 100F or higher  For urgent or emergent issues, a gastroenterologist can be reached at any hour by calling [phone]. Do not use MyChart messaging for urgent concerns.    DIET:  We do recommend a small meal at first, but then you may proceed to your regular diet.  Drink plenty of fluids but you should avoid alcoholic beverages for 24 hours.  ACTIVITY:  You should plan to take it easy for the rest of today and you should  NOT DRIVE or use heavy machinery until tomorrow (because of the sedation medicines used during the test).    FOLLOW UP: Our staff will call the number listed on your records 48-72 hours following your procedure to check on you and address any questions or concerns that you may have regarding the information given to you following your procedure. If we do not reach you, we will leave a message.  We will attempt to reach you two times.  During this call, we will ask if you have developed any symptoms of COVID 19. If you develop any symptoms (ie: fever, flu-like symptoms, shortness of breath, cough etc.) before then, please call [phone].  If you test positive for Covid 19 in the 2 weeks post procedure, please call and report this information to us.    If any biopsies were taken you will be contacted by phone or by letter within the next 1-3 weeks.  Please call us at [phone] if you have not heard about the biopsies in 3 weeks.    SIGNATURES/CONFIDENTIALITY: You and/or your care partner have signed paperwork which will be entered into your electronic medical record.  These signatures attest to the fact that that the information above on your After Visit Summary has been reviewed and is understood.  Full responsibility of the confidentiality of this discharge information lies with you and/or your care-partner. 

## 2020-07-17 NOTE — Progress Notes (Signed)
Report to PACU, RN, vss, BBS= Clear.  

## 2020-07-21 ENCOUNTER — Ambulatory Visit (INDEPENDENT_AMBULATORY_CARE_PROVIDER_SITE_OTHER): Payer: Medicare HMO | Admitting: Primary Care

## 2020-07-21 ENCOUNTER — Other Ambulatory Visit: Payer: Self-pay

## 2020-07-21 ENCOUNTER — Telehealth: Payer: Self-pay

## 2020-07-21 ENCOUNTER — Telehealth: Payer: Self-pay | Admitting: *Deleted

## 2020-07-21 ENCOUNTER — Encounter: Payer: Self-pay | Admitting: Primary Care

## 2020-07-21 VITALS — BP 118/62 | HR 96 | Temp 96.0°F | Ht 66.0 in | Wt 258.0 lb

## 2020-07-21 DIAGNOSIS — E119 Type 2 diabetes mellitus without complications: Secondary | ICD-10-CM

## 2020-07-21 DIAGNOSIS — Z23 Encounter for immunization: Secondary | ICD-10-CM | POA: Diagnosis not present

## 2020-07-21 NOTE — Telephone Encounter (Signed)
Called #, left message regarding follow up of procedure. LN 

## 2020-07-21 NOTE — Progress Notes (Signed)
   Subjective:    Patient ID: Danielle Edwards, female    DOB: [birth year], 64 y.o.   MRN: [id number]  HPI  This visit occurred during the SARS-CoV-2 public health emergency.  Safety protocols were in place, including screening questions prior to the visit, additional usage of staff PPE, and extensive cleaning of exam room while observing appropriate contact time as indicated for disinfecting solutions.   Danielle Edwards is a 64 year old female with a history of hypertension, Type 2 diabetes, hypothyroidism, CKD, anxiety, hyperlipidemia, & anemia who presents today for diabetes follow up.   Current medications include: Lantus 35 units once daily at bedtime & Metformin 1,000mg  twice daily.   She has been complaint to this regimen.    She is checking her blood glucose 1-2 times most days.  Fasting blood sugars have been: 90-140's.  Afternoon noon readings before dinner: 150-160's  Last A1C: 10.9 Last Eye Exam: Due, needs to make reschedule due to covid  Last Foot Exam: UTD  Pneumonia Vaccination: Pneumovax 23 in 2016 ACE/ARB: losartan  Statin: atorvastatin   Diet currently consists of: Has been doing adkins shakes for breakfast, zero sugar yogurts, more vegetables , fruits and nuts. Trying to avoid most carbs.    Exercise: None at this time. Plan to start walking.   Lab Results  Component Value Date   HGBA1C 10.9 (H) 06/11/2020    Wt Readings from Last 3 Encounters:  07/21/20 258 lb (117 kg)  07/17/20 253 lb (114.8 kg)  06/26/20 253 lb (114.8 kg)      Review of Systems  Constitutional: Negative.   Respiratory: Negative.  Negative for chest tightness and shortness of breath.   Cardiovascular: Negative.  Negative for chest pain.  Gastrointestinal: Negative.   Neurological: Negative.  Negative for dizziness, numbness and headaches.       Objective:   Physical Exam Constitutional:      Appearance: Normal appearance.  Cardiovascular:     Rate and Rhythm: Normal rate and  regular rhythm.     Pulses: Normal pulses.     Heart sounds: Normal heart sounds.  Pulmonary:     Effort: Pulmonary effort is normal.     Breath sounds: Normal breath sounds.  Musculoskeletal:        General: Normal range of motion.  Skin:    Capillary Refill: Capillary refill takes less than 2 seconds.  Neurological:     General: No focal deficit present.     Mental Status: She is alert.           Assessment & Plan:

## 2020-07-21 NOTE — Assessment & Plan Note (Addendum)
Lab Results  Component Value Date   HGBA1C 10.9 (H) 06/11/2020   Is now taking blood sugar at least once daily. Fasting and 2 hours after meal checks have been consistent with improved control of blood sugar.  Continue to keep log of blood sugar. Continue Lantus 35 units and Metformin 1000 mg BID. Encouraged to continue to improve diet and exercise.  Will follow up in 2 months & recheck A1c at that time.    Agree with Assessment and plan. Pleas Koch, NP

## 2020-07-21 NOTE — Progress Notes (Signed)
Subjective:    Patient ID: Danielle Edwards, female    DOB: 1956/01/18, 64 y.o.   MRN: [id number]  HPI  This visit occurred during the SARS-CoV-2 public health emergency.  Safety protocols were in place, including screening questions prior to the visit, additional usage of staff PPE, and extensive cleaning of exam room while observing appropriate contact time as indicated for disinfecting solutions.   Danielle Edwards is a 64 year old female with a history of hypertension, type 2 diabetes, CKD, chronic pain, anxiety and depression, hyperlipidemia, Covid-19 infection who presents today for follow up of diabetes.  Current medications include: Metformin 1000 mg BID, Lantus 35 units daily  She is checking her blood glucose 1-2 times daily and is getting readings of:  AM fasting: 90-140's Before Dinner: 150's-160's   Last A1C: 10.9 in early September 2021 Last Eye Exam: Due Last Foot Exam: Due Pneumonia Vaccination: ACE/ARB: Losartan  Statin: Lipitor  BP Readings from Last 3 Encounters:  07/21/20 118/62  07/17/20 117/67  06/11/20 128/78   Wt Readings from Last 3 Encounters:  07/21/20 258 lb (117 kg)  07/17/20 253 lb (114.8 kg)  06/26/20 253 lb (114.8 kg)   She's been working to improve her diet, plans on starting to walk soon.    Review of Systems  Eyes: Negative for visual disturbance.  Respiratory: Negative for shortness of breath.   Cardiovascular: Negative for chest pain.  Neurological: Negative for dizziness and numbness.        Past Medical History:  Diagnosis Date  . Allergy    seasonal allergies  . Anemia    hx of IDA  . Anxiety    on meds  . Arthritis    PRN meds-osteoarthritis  . Cataract    sx to remove  . Chronic kidney disease    CKD-stage 3  . Chronic pain   . Depression    on meds  . Diabetes type 2, controlled ([place])    on meds  . Fibromyalgia   . GERD (gastroesophageal reflux disease)    on meds  . Headache   . History of colonic polyps 1999    Benign  . Hx of adenomatous polyp of colon 09/27/2000   2001 - diminutive adenoma [place] 2007 no polyps - [place]  . Hyperlipidemia    on meds  . Hypertension    on meds  . Hypothyroidism    not on meds at this time  . IBS (irritable bowel syndrome)    Diarrhea type  . Neuromuscular disorder ([place])   . Vitamin D deficiency      Social History   Socioeconomic History  . Marital status: Married    Spouse name: Not on file  . Number of children: Not on file  . Years of education: Not on file  . Highest education level: Not on file  Occupational History  . Not on file  Tobacco Use  . Smoking status: Never Smoker  . Smokeless tobacco: Never Used  Vaping Use  . Vaping Use: Never used  Substance and Sexual Activity  . Alcohol use: Yes    Alcohol/week: 0.0 standard drinks    Comment: rarely  . Drug use: No  . Sexual activity: Not on file  Other Topics Concern  . Not on file  Social History Narrative   Married.   3 children.   Retired, worked at [employer] in [place], [place].   Enjoys being with her grandchildren, being with her friends.   Social Determinants of  Health   Financial Resource Strain:   . Difficulty of Paying Living Expenses: Not on file  Food Insecurity:   . Worried About Charity fundraiser in the Last Year: Not on file  . Ran Out of Food in the Last Year: Not on file  Transportation Needs:   . Lack of Transportation (Medical): Not on file  . Lack of Transportation (Non-Medical): Not on file  Physical Activity:   . Days of Exercise per Week: Not on file  . Minutes of Exercise per Session: Not on file  Stress:   . Feeling of Stress : Not on file  Social Connections:   . Frequency of Communication with Friends and Family: Not on file  . Frequency of Social Gatherings with Friends and Family: Not on file  . Attends Religious Services: Not on file  . Active Member of Clubs or Organizations: Not on file  . Attends Archivist Meetings: Not on  file  . Marital Status: Not on file  Intimate Partner Violence:   . Fear of Current or Ex-Partner: Not on file  . Emotionally Abused: Not on file  . Physically Abused: Not on file  . Sexually Abused: Not on file    Past Surgical History:  Procedure Laterality Date  . CHOLECYSTECTOMY  1995  . COLONOSCOPY  multiple  . COLONOSCOPY  2016   TA's  . NASAL SINUS SURGERY  2001  . POLYPECTOMY     TA's  . TONSILLECTOMY AND ADENOIDECTOMY    . VAGINAL HYSTERECTOMY     Complete  . WISDOM TOOTH EXTRACTION      Family History  Problem Relation Age of Onset  . Colon cancer Mother 20  . Lung cancer Mother 69  . Skin cancer Mother   . Colon polyps Mother 77  . Heart disease Father   . Diabetes Father   . Skin cancer Father   . Colon polyps Brother 35  . Stomach cancer Paternal Grandmother 52  . Breast cancer Neg Hx   . Esophageal cancer Neg Hx   . Rectal cancer Neg Hx     No Known Allergies  Current Outpatient Medications on File Prior to Visit  Medication Sig Dispense Refill  . Accu-Chek FastClix Lancets MISC USE AS INSTRUCTED TO CHECK BLOOD SUGAR UP TO 3 TIMES DAILY 306 each 1  . amLODipine (NORVASC) 10 MG tablet Take 1 tablet (10 mg total) by mouth daily. For blood pressure. 90 tablet 3  . atorvastatin (LIPITOR) 40 MG tablet Take 1 tablet (40 mg total) by mouth at bedtime. For cholesterol. 90 tablet 3  . blood glucose meter kit and supplies KIT Dispense based on patient and insurance preference. Use up to four times daily as directed. (FOR ICD-9 250.00, 250.01). 1 each 0  . Blood Glucose Monitoring Suppl (ACCU-CHEK AVIVA PLUS) w/Device KIT Use as instructed to test blood sugar 3 times daily 1 kit 0  . glucose blood (ACCU-CHEK AVIVA) test strip Use as instructed to test blood sugar up to 3 times daily 300 each 1  . Insulin Pen Needle 32G X 6 MM MISC Use as instructed to inject insulin daily at bedtime 100 each 3  . loperamide (IMODIUM A-D) 2 MG tablet Take 2 mg by mouth 4 (four)  times daily as needed for diarrhea or loose stools.    Marland Kitchen losartan (COZAAR) 100 MG tablet Take 1 tablet (100 mg total) by mouth daily. For blood pressure. 90 tablet 3  . metFORMIN (GLUCOPHAGE) 1000  MG tablet Take 1 tablet (1,000 mg total) by mouth 2 (two) times daily with a meal. For diabetes. 180 tablet 3  . oxyCODONE (OXY IR/ROXICODONE) 5 MG immediate release tablet Take 5 mg by mouth 4 (four) times daily as needed.    . pantoprazole (PROTONIX) 20 MG tablet Take 1 tablet (20 mg total) by mouth daily. (Patient taking differently: Take 20 mg by mouth daily as needed. ) 90 tablet 0  . promethazine (PHENERGAN) 25 MG tablet Take 25 mg by mouth every 6 (six) hours as needed. Reported on 09/21/2015  1  . venlafaxine XR (EFFEXOR XR) 37.5 MG 24 hr capsule Take 1 capsule (37.5 mg total) by mouth daily with breakfast. For anxiety and depression. 90 capsule 3  . insulin glargine (LANTUS SOLOSTAR) 100 UNIT/ML Solostar Pen Inject 35 Units into the skin daily. (Patient taking differently: Inject 35 Units into the skin daily. 34 u daily) 15 mL 1   No current facility-administered medications on file prior to visit.    BP 118/62   Pulse 96   Temp (!) 96 F (35.6 C) (Temporal)   Ht 5' 6"  (1.676 m)   Wt 258 lb (117 kg)   SpO2 96%   BMI 41.64 kg/m    Objective:   Physical Exam Cardiovascular:     Rate and Rhythm: Normal rate and regular rhythm.  Pulmonary:     Effort: Pulmonary effort is normal.     Breath sounds: Normal breath sounds.  Musculoskeletal:     Cervical back: Neck supple.  Skin:    General: Skin is warm and dry.            Assessment & Plan:

## 2020-07-21 NOTE — Patient Instructions (Addendum)
It is important that you improve your diet. Please limit carbohydrates in the form of white bread, rice, pasta, sweets, fast food, fried food, sugary drinks, etc. Increase your consumption of fresh fruits and vegetables, whole grains, lean protein.  Ensure you are consuming 64 ounces of water daily.  Start exercising. You should be getting 150 minutes of moderate intensity exercise weekly.  Continue to keep checking your blood sugar levels.  Appropriate times to check your blood sugar levels are:  -Before any meal (breakfast, lunch, dinner) -Two hours after any meal (breakfast, lunch, dinner) -Bedtime  Record your readings and notify me if you continue to consistently run at or above 200.   Follow up in 2 months to recheck A1c.   Influenza (Flu) Vaccine (Inactivated or Recombinant): What You Need to Know 1. Why get vaccinated? Influenza vaccine can prevent influenza (flu). Flu is a contagious disease that spreads around the [place] every year, usually between October and May. Anyone can get the flu, but it is more dangerous for some people. Infants and young children, people 58 years of age and older, pregnant women, and people with certain health conditions or a weakened immune system are at greatest risk of flu complications. Pneumonia, bronchitis, sinus infections and ear infections are examples of flu-related complications. If you have a medical condition, such as heart disease, cancer or diabetes, flu can make it worse. Flu can cause fever and chills, sore throat, muscle aches, fatigue, cough, headache, and runny or stuffy nose. Some people may have vomiting and diarrhea, though this is more common in children than adults. Each year thousands of people in the [place] die from flu, and many more are hospitalized. Flu vaccine prevents millions of illnesses and flu-related visits to the doctor each year. 2. Influenza vaccine CDC recommends everyone 32 months of age and older get  vaccinated every flu season. Children 6 months through 63 years of age may need 2 doses during a single flu season. Everyone else needs only 1 dose each flu season. It takes about 2 weeks for protection to develop after vaccination. There are many flu viruses, and they are always changing. Each year a new flu vaccine is made to protect against three or four viruses that are likely to cause disease in the upcoming flu season. Even when the vaccine doesn't exactly match these viruses, it may still provide some protection. Influenza vaccine does not cause flu. Influenza vaccine may be given at the same time as other vaccines. 3. Talk with your health care provider Tell your vaccine provider if the person getting the vaccine:  Has had an allergic reaction after a previous dose of influenza vaccine, or has any severe, life-threatening allergies.  Has ever had Guillain-Barr Syndrome (also called GBS). In some cases, your health care provider may decide to postpone influenza vaccination to a future visit. People with minor illnesses, such as a cold, may be vaccinated. People who are moderately or severely ill should usually wait until they recover before getting influenza vaccine. Your health care provider can give you more information. 4. Risks of a vaccine reaction  Soreness, redness, and swelling where shot is given, fever, muscle aches, and headache can happen after influenza vaccine.  There may be a very small increased risk of Guillain-Barr Syndrome (GBS) after inactivated influenza vaccine (the flu shot). Young children who get the flu shot along with pneumococcal vaccine (PCV13), and/or DTaP vaccine at the same time might be slightly more likely to have a  seizure caused by fever. Tell your health care provider if a child who is getting flu vaccine has ever had a seizure. People sometimes faint after medical procedures, including vaccination. Tell your provider if you feel dizzy or have vision  changes or ringing in the ears. As with any medicine, there is a very remote chance of a vaccine causing a severe allergic reaction, other serious injury, or death. 5. What if there is a serious problem? An allergic reaction could occur after the vaccinated person leaves the clinic. If you see signs of a severe allergic reaction (hives, swelling of the face and throat, difficulty breathing, a fast heartbeat, dizziness, or weakness), call 9-1-1 and get the person to the nearest hospital. For other signs that concern you, call your health care provider. Adverse reactions should be reported to the Vaccine Adverse Event Reporting System (VAERS). Your health care provider will usually file this report, or you can do it yourself. Visit the VAERS website at www.vaers.SamedayNews.es or call 780-380-0447.VAERS is only for reporting reactions, and VAERS staff do not give medical advice. 6. The National Vaccine Injury Compensation Program The [employer] Vaccine Injury Compensation Program (VICP) is a federal program that was created to compensate people who may have been injured by certain vaccines. Visit the VICP website at GoldCloset.com.ee or call 203-633-0327 to learn about the program and about filing a claim. There is a time limit to file a claim for compensation. 7. How can I learn more?  Ask your healthcare provider.  Call your local or state health department.  Contact the Centers for Disease Control and Prevention (CDC): ? Call (365)510-8823 (1-800-CDC-INFO) or ? Visit CDC's https://gibson.com/ Vaccine Information Statement (Interim) Inactivated Influenza Vaccine (05/17/2018) This information is not intended to replace advice given to you by your health care provider. Make sure you discuss any questions you have with your health care provider. Document Revised: 01/08/2019 Document Reviewed: 05/21/2018 Elsevier Patient Education  [place].

## 2020-07-21 NOTE — Telephone Encounter (Signed)
  Follow up Call-  Call back number 07/17/2020  Post procedure Call Back phone  # [phone]  Permission to leave phone message Yes  Some recent data might be hidden     No answer at 2nd attempt follow up phone call.  Left message on voicemail.

## 2020-07-23 ENCOUNTER — Encounter: Payer: Self-pay | Admitting: Gastroenterology

## 2020-08-06 ENCOUNTER — Other Ambulatory Visit: Payer: Self-pay | Admitting: Primary Care

## 2020-08-06 ENCOUNTER — Telehealth: Payer: Self-pay | Admitting: Primary Care

## 2020-08-06 DIAGNOSIS — E119 Type 2 diabetes mellitus without complications: Secondary | ICD-10-CM

## 2020-08-06 NOTE — Progress Notes (Signed)
  Chronic Care Management   Outreach Note  08/06/2020 Name: Selisa Tensley MRN: [id number] DOB: [birth year]  Referred by: Pleas Koch, NP Reason for referral : Chronic Care Management   An unsuccessful telephone outreach was attempted today. The patient was referred to the pharmacist for assistance with care management and care coordination.   Follow Up Plan:   Hilario Quarry  Upstream Scheduler

## 2020-09-10 DIAGNOSIS — M5416 Radiculopathy, lumbar region: Secondary | ICD-10-CM | POA: Diagnosis not present

## 2020-09-10 DIAGNOSIS — M545 Low back pain, unspecified: Secondary | ICD-10-CM | POA: Diagnosis not present

## 2020-09-10 DIAGNOSIS — M5382 Other specified dorsopathies, cervical region: Secondary | ICD-10-CM | POA: Diagnosis not present

## 2020-09-10 DIAGNOSIS — M5412 Radiculopathy, cervical region: Secondary | ICD-10-CM | POA: Diagnosis not present

## 2020-09-16 ENCOUNTER — Telehealth: Payer: Self-pay | Admitting: Primary Care

## 2020-09-16 ENCOUNTER — Other Ambulatory Visit: Payer: Self-pay | Admitting: Primary Care

## 2020-09-16 NOTE — Telephone Encounter (Signed)
Called patient to schedule OV instead of labs for 12/20. Could not leave voicemail as mailbox full. Messaged via mychart.

## 2020-09-16 NOTE — Telephone Encounter (Signed)
-----   Message from Pleas Koch, NP sent at 09/16/2020  1:38 PM EST ----- Regarding: RE: Lab orders for Monday, 12.20.21 This needs to be an in person visit. Janece Laidlaw, can you schedule her for in person? ----- Message ----- From: Ellamae Sia Sent: 09/07/2020   4:18 PM EST To: Pleas Koch, NP Subject: Lab orders for Monday, 12.20.21                Lab orders, thanks

## 2020-09-21 ENCOUNTER — Other Ambulatory Visit: Payer: Medicare HMO

## 2020-10-28 ENCOUNTER — Telehealth: Payer: Self-pay | Admitting: Primary Care

## 2020-10-28 NOTE — Chronic Care Management (AMB) (Signed)
  Chronic Care Management   Note  10/28/2020 Name: Danielle Edwards MRN: [id number] DOB: 1955/11/30  Danielle Edwards is a 65 y.o. year old female who is a primary care patient of Danielle Koch, Danielle Edwards. I reached out to Danielle Edwards by phone today in response to a referral sent by Danielle Edwards PCP, Danielle Koch, Danielle Edwards.   Danielle Edwards was given information about Chronic Care Management services today including:  1. CCM service includes personalized support from designated clinical staff supervised by her physician, including individualized plan of care and coordination with other care providers 2. 24/7 contact phone numbers for assistance for urgent and routine care needs. 3. Service will only be billed when office clinical staff spend 20 minutes or more in a month to coordinate care. 4. Only one practitioner may furnish and bill the service in a calendar month. 5. The patient may stop CCM services at any time (effective at the end of the month) by phone call to the office staff.   Patient agreed to services and verbal consent obtained.   Follow up plan:   [place]

## 2020-11-24 ENCOUNTER — Other Ambulatory Visit: Payer: Self-pay | Admitting: Primary Care

## 2020-11-24 DIAGNOSIS — E1169 Type 2 diabetes mellitus with other specified complication: Secondary | ICD-10-CM

## 2020-11-24 DIAGNOSIS — E785 Hyperlipidemia, unspecified: Secondary | ICD-10-CM

## 2020-11-26 DIAGNOSIS — M5416 Radiculopathy, lumbar region: Secondary | ICD-10-CM | POA: Diagnosis not present

## 2020-11-26 DIAGNOSIS — M545 Low back pain, unspecified: Secondary | ICD-10-CM | POA: Diagnosis not present

## 2020-11-26 DIAGNOSIS — M5412 Radiculopathy, cervical region: Secondary | ICD-10-CM | POA: Diagnosis not present

## 2020-11-26 DIAGNOSIS — Z79899 Other long term (current) drug therapy: Secondary | ICD-10-CM | POA: Diagnosis not present

## 2020-11-26 DIAGNOSIS — M5382 Other specified dorsopathies, cervical region: Secondary | ICD-10-CM | POA: Diagnosis not present

## 2020-12-02 ENCOUNTER — Telehealth: Payer: Self-pay

## 2020-12-02 NOTE — Chronic Care Management (AMB) (Addendum)
Chronic Care Management Pharmacy Assistant   Name: Danielle Edwards  MRN: [id number] DOB: 1956-06-14  Reason for Encounter: Initial questions for CCM visit scheduled 12/08/20  PCP : Pleas Koch, NP  Allergies:  No Known Allergies  Medications: Outpatient Encounter Medications as of 12/02/2020  Medication Sig   Accu-Chek FastClix Lancets MISC USE AS INSTRUCTED TO CHECK BLOOD SUGAR UP TO 3 TIMES DAILY   amLODipine (NORVASC) 10 MG tablet Take 1 tablet (10 mg total) by mouth daily. For blood pressure.   atorvastatin (LIPITOR) 40 MG tablet TAKE ONE TABLET BY MOUTH AT BEDTIME FOR CHOLESTEROL   blood glucose meter kit and supplies KIT Dispense based on patient and insurance preference. Use up to four times daily as directed. (FOR ICD-9 250.00, 250.01).   Blood Glucose Monitoring Suppl (ACCU-CHEK AVIVA PLUS) w/Device KIT Use as instructed to test blood sugar 3 times daily   glucose blood (ACCU-CHEK AVIVA) test strip Use as instructed to test blood sugar up to 3 times daily   Insulin Pen Needle 32G X 6 MM MISC Use as instructed to inject insulin daily at bedtime   LANTUS SOLOSTAR 100 UNIT/ML Solostar Pen INJECT 35 UNITS INTO THE SKIN ONCE DAILY.   loperamide (IMODIUM A-D) 2 MG tablet Take 2 mg by mouth 4 (four) times daily as needed for diarrhea or loose stools.   losartan (COZAAR) 100 MG tablet Take 1 tablet (100 mg total) by mouth daily. For blood pressure.   metFORMIN (GLUCOPHAGE) 1000 MG tablet Take 1 tablet (1,000 mg total) by mouth 2 (two) times daily with a meal. For diabetes.   oxyCODONE (OXY IR/ROXICODONE) 5 MG immediate release tablet Take 5 mg by mouth 4 (four) times daily as needed.   pantoprazole (PROTONIX) 20 MG tablet Take 1 tablet (20 mg total) by mouth daily. (Patient taking differently: Take 20 mg by mouth daily as needed. )   promethazine (PHENERGAN) 25 MG tablet Take 25 mg by mouth every 6 (six) hours as needed. Reported on 09/21/2015   venlafaxine XR (EFFEXOR XR) 37.5 MG  24 hr capsule Take 1 capsule (37.5 mg total) by mouth daily with breakfast. For anxiety and depression.   No facility-administered encounter medications on file as of 12/02/2020.    Current Diagnosis: Patient Active Problem List   Diagnosis Date Noted   COVID-19 virus infection 06/02/2020   Hypercalcemia 02/20/2020   Localized edema 02/20/2020   History of attention deficit disorder 10/18/2019   Fatigue 09/17/2019   CKD (chronic kidney disease) stage 3, GFR 30-59 ml/min ([facility]) 12/11/2018   Anemia 02/06/2018   Vitamin D deficiency 08/05/2016   Recurrent sinusitis 10/30/2015   Encounter for annual general medical examination with abnormal findings in adult 04/13/2015   Hyperlipidemia LDL goal <100 04/13/2015   Esophageal reflux 03/23/2015   Family history of colon cancer in mother 02/23/2015   Family history of malignant neoplasm of digestive organs 02/23/2015   Essential hypertension 02/10/2015   DM type 2 (diabetes mellitus, type 2) ([place]) 02/10/2015   Hypothyroidism 02/10/2015   Chronic pain 02/10/2015   Anxiety and depression 02/10/2015   Hx of adenomatous polyp of colon 09/27/2000   Multiple attempts were made to contact patient. Left message with details of upcoming appointment.   Patient's preferred pharmacy is:  [facility], [address] [place] [address] Phone: [phone] Fax: [phone]  [id number] [place] [address] [id number]:  [phone] Fax: [phone]    Left message for patient  to have all medications, supplements and any blood glucose and blood pressure readings available for review with Debbora Dus, Pharm. D, at her telephone visit on 12/08/20 at 11:00 AM .   Follow-Up:  Pharmacist Review  Debbora Dus, CPP notified  Margaretmary Dys, [place] Pharmacy Assistant [phone]

## 2020-12-07 ENCOUNTER — Other Ambulatory Visit: Payer: Self-pay | Admitting: Primary Care

## 2020-12-07 DIAGNOSIS — I10 Essential (primary) hypertension: Secondary | ICD-10-CM

## 2020-12-07 DIAGNOSIS — E119 Type 2 diabetes mellitus without complications: Secondary | ICD-10-CM

## 2020-12-08 ENCOUNTER — Ambulatory Visit (INDEPENDENT_AMBULATORY_CARE_PROVIDER_SITE_OTHER): Payer: Medicare HMO

## 2020-12-08 ENCOUNTER — Other Ambulatory Visit: Payer: Self-pay

## 2020-12-08 ENCOUNTER — Telehealth: Payer: Self-pay

## 2020-12-08 DIAGNOSIS — E119 Type 2 diabetes mellitus without complications: Secondary | ICD-10-CM | POA: Diagnosis not present

## 2020-12-08 DIAGNOSIS — E785 Hyperlipidemia, unspecified: Secondary | ICD-10-CM | POA: Diagnosis not present

## 2020-12-08 DIAGNOSIS — I10 Essential (primary) hypertension: Secondary | ICD-10-CM | POA: Diagnosis not present

## 2020-12-08 NOTE — Telephone Encounter (Addendum)
Attempted to reach patient for CCM initial visit. Voicemail box full. Last A1c elevated at 10.9% but home monitoring improving at PCP visit 10/19. Missed PCP follow up in December.   Chart reviewed for CCM visit on 12/08/20 at 11 AM.  Recent Office Visits:  07/21/20 - PCP - DM f/u, Continue Lantus 35 units and Metformin 1000 mg BID. Encouraged to continue to improve diet and exercise. Will follow up in 2 months & recheck A1c at that time  06/11/20 - Lab note - She must start checking her blood sugars at least twice daily. I would like to increase her Lantus insulin to 35 units daily.   06/10/20 - PCP - follow up of diabetes and Covid-19. Plan, Labs ordered. Start checking your blood sugar levels.    Debbora Dus, PharmD Clinical Pharmacist [facility] [phone]

## 2020-12-08 NOTE — Progress Notes (Signed)
Chronic Care Management Pharmacy Note  12/09/2020 Name:  Danielle Edwards MRN:  [id number] DOB:  09-18-1956  Subjective: Danielle Edwards is an 65 y.o. year old female who is a primary patient of Pleas Koch, NP.  The CCM team was consulted for assistance with disease management and care coordination needs.    Engaged with patient by telephone for initial visit in response to provider referral for pharmacy case management and/or care coordination services.   CCM consent 10/28/20  Consent to Services:  The patient was given the following information about Chronic Care Management services today, agreed to services, and gave verbal consent: 1. CCM service includes personalized support from designated clinical staff supervised by the primary care provider, including individualized plan of care and coordination with other care providers 2. 24/7 contact phone numbers for assistance for urgent and routine care needs. 3. Service will only be billed when office clinical staff spend 20 minutes or more in a month to coordinate care. 4. Only one practitioner may furnish and bill the service in a calendar month. 5.The patient may stop CCM services at any time (effective at the end of the month) by phone call to the office staff. 6. The patient will be responsible for cost sharing (co-pay) of up to 20% of the service fee (after annual deductible is met). Patient agreed to services and consent obtained.  Patient Care Team: Pleas Koch, NP as PCP - General (Nurse Practitioner) Debbora Dus, [facility] as Pharmacist (Pharmacist)  Recent office visits:  07/21/20 - PCP - DM f/u, She is checking her blood glucose 1-2 times most days. Fasting blood sugars have been: 90-140's. Afternoon noon readings before dinner: 150-160's. Continue Lantus 35 units and Metformin 1000 mg BID. Encouraged to continue to improve diet and exercise. Will follow up in 2 months & recheck A1c at that time  06/11/20 - Lab notes -  Thyroid, cholesterol look okay. Diabetes is uncontrolled, worse than last visit. She must start checking her blood sugars at least twice daily. I would like to increase her Lantus insulin to 35 units daily. I would also like to see her back either virtually or in person for diabetes follow-up in 1 month.   06/10/20 - PCP - follow up of diabetes and Covid-19. Plan, Call the main office line to schedule a nurse visit for blood pressure and weight check, and a lab appointment. Start checking your blood sugar levels.   Recent consult visits: None in past 6 months  Hospital visits: None in previous 6 months  Objective:  Lab Results  Component Value Date   CREATININE 1.04 06/11/2020   BUN 30 (H) 06/11/2020   GFR 53.15 (L) 06/11/2020   NA 138 06/11/2020   K 4.5 06/11/2020   CALCIUM 10.7 (H) 06/11/2020   CO2 24 06/11/2020   Lab Results  Component Value Date/Time   HGBA1C 10.9 (H) 06/11/2020 03:14 PM   HGBA1C 9.9 (A) 12/20/2019 10:55 AM   HGBA1C 8.8 (A) 12/11/2018 04:50 PM   HGBA1C 8.8 (H) 02/05/2018 10:12 AM   GFR 53.15 (L) 06/11/2020 03:14 PM   GFR 37.77 (L) 12/20/2019 11:29 AM   MICROALBUR 4.4 (H) 08/05/2016 11:14 AM   MICROALBUR 2.0 (H) 04/07/2015 09:15 AM    Last diabetic Eye exam: seems to be past due per chart review  Last diabetic Foot exam: seems to be past due per chart review  Lab Results  Component Value Date   CHOL 165 06/11/2020   HDL 39.70 06/11/2020  LDLCALC 95 06/11/2020   LDLDIRECT 112.0 02/05/2018   TRIG 151.0 (H) 06/11/2020   CHOLHDL 4 06/11/2020    Hepatic Function Latest Ref Rng & Units 06/11/2020 12/11/2018 01/23/2018  Total Protein 6.0 - 8.3 g/dL 7.5 7.9 7.5  Albumin 3.5 - 5.2 g/dL 4.0 4.2 3.8  AST 0 - 37 U/L 29 30 23   ALT 0 - 35 U/L 31 29 26   Alk Phosphatase 39 - 117 U/L 77 88 93  Total Bilirubin 0.2 - 1.2 mg/dL 0.4 0.2 0.2  Bilirubin, Direct 0.0 - 0.3 mg/dL - - -    Lab Results  Component Value Date/Time   TSH 2.29 06/11/2020 03:14 PM   TSH 3.05  09/17/2019 03:23 PM    CBC Latest Ref Rng & Units 12/20/2019 01/23/2018 06/29/2015  WBC 4.0 - 10.5 K/uL 7.3 10.3 7.9  Hemoglobin 12.0 - 15.0 g/dL 11.1(L) 9.7(L) 10.6(L)  Hematocrit 36.0 - 46.0 % 34.1(L) 31.4(L) 33.7(L)  Platelets 150.0 - 400.0 K/uL 253.0 350.0 302.0    Lab Results  Component Value Date/Time   VD25OH 37.22 10/30/2017 09:33 AM   VD25OH 37.66 08/05/2016 11:14 AM    Clinical ASCVD: No  The 10-year ASCVD risk score Mikey Bussing DC Jr., et al., 2013) is: 12.4%   Values used to calculate the score:     Age: 48 years     Sex: Female     Is Non-Hispanic African American: No     Diabetic: Yes     Tobacco smoker: No     Systolic Blood Pressure: 209 mmHg     Is BP treated: Yes     HDL Cholesterol: 39.7 mg/dL     Total Cholesterol: 165 mg/dL    Depression screen [facility] 2/9 12/08/2020 09/12/2019  Decreased Interest 1 0  Down, Depressed, Hopeless 0 0  PHQ - 2 Score 1 0  Altered sleeping - 0  Tired, decreased energy - 0  Change in appetite - 0  Feeling bad or failure about yourself  - 0  Trouble concentrating - 0  Moving slowly or fidgety/restless - 0  Suicidal thoughts - 0  PHQ-9 Score - 0  Difficult doing work/chores - Not difficult at all     Social History   Tobacco Use  Smoking Status Never Smoker  Smokeless Tobacco Never Used   BP Readings from Last 3 Encounters:  07/21/20 118/62  07/17/20 117/67  06/11/20 128/78   Pulse Readings from Last 3 Encounters:  07/21/20 96  07/17/20 70  06/11/20 71   Wt Readings from Last 3 Encounters:  07/21/20 258 lb (117 kg)  07/17/20 253 lb (114.8 kg)  06/26/20 253 lb (114.8 kg)    Assessment/Interventions: Review of patient past medical history, allergies, medications, health status, including review of consultants reports, laboratory and other test data, was performed as part of comprehensive evaluation and provision of chronic care management services.   SDOH:  (Social Determinants of Health) assessments and interventions  performed: Yes SDOH Interventions   Flowsheet Row Most Recent Value  SDOH Interventions   Financial Strain Interventions Intervention Not Indicated  [Medications affordable]      CCM Care Plan  No Known Allergies  Medications Reviewed Today    Reviewed by Debbora Dus, [facility] (Pharmacist) on 12/09/20 at 0955  Med List Status: <None>  Medication Order Taking? Sig Documenting Provider Last Dose Status Informant  Accu-Chek FastClix Lancets MISC [id number] No USE AS INSTRUCTED TO CHECK BLOOD SUGAR UP TO 3 TIMES DAILY  Patient not taking: Reported on  12/08/2020   Pleas Koch, NP Not Taking Active   amLODipine (NORVASC) 10 MG tablet [id number] Yes TAKE 1 TABLET BY MOUTH ONCE DAILY FOR BLOOD PRESSURE Pleas Koch, NP Taking Active   atorvastatin (LIPITOR) 40 MG tablet [id number] Yes TAKE ONE TABLET BY MOUTH AT BEDTIME FOR CHOLESTEROL Pleas Koch, NP Taking Active   blood glucose meter kit and supplies KIT [id number] No Dispense based on patient and insurance preference. Use up to four times daily as directed. (FOR ICD-9 250.00, 250.01).  Patient not taking: Reported on 12/08/2020   Pleas Koch, NP Not Taking Active   Blood Glucose Monitoring Suppl (ACCU-CHEK AVIVA PLUS) w/Device KIT [id number] No Use as instructed to test blood sugar 3 times daily  Patient not taking: Reported on 12/08/2020   Pleas Koch, NP Not Taking Active   Cholecalciferol (VITAMIN D3) 125 MCG (5000 UT) CAPS [id number] Yes Take 5,000 Units by mouth daily. [provider] Taking Active   glucose blood (ACCU-CHEK AVIVA) test strip [id number] No Use as instructed to test blood sugar up to 3 times daily  Patient not taking: Reported on 12/08/2020   Pleas Koch, NP Not Taking Active   Insulin Pen Needle 32G X 6 MM MISC [id number] Yes Use as instructed to inject insulin daily at bedtime Pleas Koch, NP Taking Active   LANTUS SOLOSTAR 100 UNIT/ML Solostar Pen [id number] Yes INJECT 35 UNITS  INTO THE SKIN ONCE DAILY. Pleas Koch, NP Taking Active   loperamide (IMODIUM A-D) 2 MG tablet [id number] Yes Take 2 mg by mouth 4 (four) times daily as needed for diarrhea or loose stools. [provider] Taking Active   losartan (COZAAR) 100 MG tablet [id number] Yes TAKE 1 TABLET BY MOUTH ONCE DAILY FOR BLOOD PRESSURE Pleas Koch, NP Taking Active   Magnesium 200 MG TABS [id number] Yes Take by mouth daily. [provider] Taking Active Self  metFORMIN (GLUCOPHAGE) 1000 MG tablet [id number] Yes TAKE 1 TABLET BY MOUTH 2 TIMES DAILY WITH A MEAL FOR DIABETES Pleas Koch, NP Taking Active   Multiple Vitamins-Minerals (CENTRUM SILVER ULTRA WOMENS PO) [id number] Yes Take by mouth daily. [provider] Taking Active Self  oxyCODONE (OXY IR/ROXICODONE) 5 MG immediate release tablet [id number] Yes Take 5 mg by mouth 4 (four) times daily as needed. [provider] Taking Active   pantoprazole (PROTONIX) 20 MG tablet [id number] Yes Take 1 tablet (20 mg total) by mouth daily.  Patient taking differently: Take 20 mg by mouth daily as needed.   Pleas Koch, NP Taking Active   promethazine (PHENERGAN) 25 MG tablet [id number] Yes Take 25 mg by mouth every 6 (six) hours as needed. Reported on 09/21/2015 [provider] Taking Active            Med Note Cristela Felt, [facility]   Fri Aug 05, 2016 10:19 AM)    venlafaxine XR (EFFEXOR XR) 37.5 MG 24 hr capsule [id number] Yes Take 1 capsule (37.5 mg total) by mouth daily with breakfast. For anxiety and depression. Pleas Koch, NP Taking Active           Patient Active Problem List   Diagnosis Date Noted  . COVID-19 virus infection 06/02/2020  . Hypercalcemia 02/20/2020  . Localized edema 02/20/2020  . History of attention deficit disorder 10/18/2019  . Fatigue 09/17/2019  . CKD (chronic kidney disease) stage 3, GFR 30-59 ml/min (HCC) 12/11/2018  . Anemia 02/06/2018  . Vitamin D deficiency  08/05/2016  .  Recurrent sinusitis 10/30/2015  . Encounter for annual general medical examination with abnormal findings in adult 04/13/2015  . Hyperlipidemia LDL goal <100 04/13/2015  . Esophageal reflux 03/23/2015  . Family history of colon cancer in mother 02/23/2015  . Family history of malignant neoplasm of digestive organs 02/23/2015  . Essential hypertension 02/10/2015  . DM type 2 (diabetes mellitus, type 2) ([place]) 02/10/2015  . Hypothyroidism 02/10/2015  . Chronic pain 02/10/2015  . Anxiety and depression 02/10/2015  . Hx of adenomatous polyp of colon 09/27/2000    Immunization History  Administered Date(s) Administered  . DTaP 12/01/2013  . Fluad Quad(high Dose 65+) 07/21/2020  . Influenza Inj Mdck Quad Pf 11/03/2017  . Influenza,inj,Quad PF,6+ Mos 06/29/2015, 05/16/2018, 09/11/2019  . Influenza-Unspecified 09/11/2019  . Janssen (J&J) SARS-COV-2 Vaccination 12/11/2019  . Pneumococcal Polysaccharide-23 04/13/2015  . Zoster Recombinat (Shingrix) 05/21/2018, 09/12/2018    Conditions to be addressed/monitored:  Hypertension, Hyperlipidemia, Diabetes, GERD, Chronic Kidney Disease, Hypothyroidism, Depression and Anxiety  Care Plan : [place]  Updates made by Debbora Dus, [place] since 12/09/2020 12:00 AM    Problem: CHL AMB "PATIENT-SPECIFIC PROBLEM"     Long-Range Goal: Disease Management   Start Date: 12/08/2020  Priority: High  Note:     Current Barriers:  . Unable to achieve control of diabetes   . Not monitoring home BG due to finger pain   Pharmacist Clinical Goal(s):  Marland Kitchen Over the next 30 days, patient will achieve adherence to monitoring guidelines and medication adherence to achieve therapeutic efficacy through collaboration with PharmD and provider.   Interventions: . 1:1 collaboration with Pleas Koch, NP regarding development and update of comprehensive plan of care as evidenced by provider attestation and co-signature . Inter-disciplinary  care team collaboration (see longitudinal plan of care) . Comprehensive medication review performed; medication list updated in electronic medical record  Hypertension (BP goal <140/90) -Controlled -Current treatment: . Losartan 100 mg - 1 tablet daily (morning - 4-5 AM) . Amlodipine 40 mg - 1 tablet daily (morning) -Medications previously tried: none reported -Current home readings: none reported, not routinely monitoring  -Denies hypotensive/hypertensive symptoms -Educated on BP goals and benefits of medications for prevention of heart attack, stroke and kidney damage; -Recommended to continue current medication  Hyperlipidemia: (LDL goal < 70) -Controlled -Current treatment: . Atorvastatin 40 mg - 1 tablet daily (morning) -Medications previously tried: none reported -Previously was not taking regularly due to concern for side effects, but taking it regularly now. -Educated on Cholesterol goals;  Benefits of statin for ASCVD risk reduction; -Recommended to continue current medication  Diabetes (A1c goal <7%) -Query controlled  -Current medications: . Lantus - 35 units daily  . Metformin 1000 mg - 1 tablet twice daily  -Medications previously tried: glimepiride, Jardiance (vaginal discomfort/yeast infections), Januvia (cost) -Current home glucose readings - no longer checking due to finger pain. Discussed proper use of lancing device. She is using lowest prick level to draw blood. She is rotating fingers. She was checking before breakfast and during day or bedtime for several months last fall.  . Fasting glucose: none  . Post prandial glucose: none  -Denies hypoglycemic/hyperglycemic symptoms -Current meal patterns: She does not eat until lunchtime usually (12-1 PM), skips BF. - Reports she lost 25 lbs when she had COVID due to poor appetite.  -Educated on:   Discussed mechanism of metformin and Lantus. Pt reports she occasionally takes an extra metformin due to having a third  meal. Discussed avoiding taking more  than 2000 mg/day.   Interested in CGM, fingers are hurting from finger sticks. Interested in trying a sample/CGM application. Prefers [employer]. She would like [place] to send rx to DME supplier for price check.  Insulin admin: She is concerned her insulin is ineffective. Reviewed proper admin. No concerns. She has occasional bruising. Rotating sites about 1 inch from belly button. Coming in at 90 degree angle.  Discussed timing of medications. She takes her metformin at different times each day due to changes in meal patterns. Discussed ER metformin as an option.  Counseled to check feet daily and get yearly eye exams  Due for A1c, last PCP visit her SMBG were within goal. Would like to re-eval A1c prior to med changes.  -Recommended to continue current medication; Try metformin ER to simplify schedule.  Coordinate labs for A1c update. Freestyle Libre sample.  Depression/Anxiety (Goal: Control symptoms) -Not ideally controlled -Current treatment: . Venlafaxine XR 37.5 mg - 1 capsule daily (not taking) -Medications previously tried/failed: Xanax - PRN anxiety -Pt is not taking currently. She reports she stopped it due to concerns about addiction/withdrawal. Discussed concerns and due to report of daily anxiety symptoms regarding IBS incontinence and nervous eating, recommended she resume daily therapy.  - Reports she used to be on Xanax PRN and took rarely for anxiety around dentist appts.  -Educated on Benefits of medication for symptom control -Recommended to continue current medication - Resume therapy.  GERD (Goal: Control symptoms) -Controlled  -Pt reports symptoms controlled -Current treatment  . Pantoprazole 20 mg - 1 tablet daily PRN triggers -Medications previously tried: none -Recommended to continue current medication  Other:  Oxycodone ER - PRN pain (takes 1-2x/week with bad weather/flares)  Loperamide - PRN loose stools/IBS (takes  for 2 days every 2-3 months due to IBS flare)  Promethazine 25 mg - PRN nausea (about 1-2x/month)  Vitamin D3 5000 IU - taking once daily   [place] [employer] 50+ - taking once daily  Magnesium (dose unknown) - taking once daily  Patient Goals/Self-Care Activities . Over the next 30 days, patient will:  - patient will come into office for [facility] sample, lab appointment, and follow up with primary care NP      Follow Up Plan:  Telephone Visit with CCM pharmacist in 30 days (01/08/21 - 11:30 AM)  Medication Assistance: None required.  Patient affirms current coverage meets needs.  Patient's preferred pharmacy is:  [facility], [place] [address] Phone: [phone] Fax: [phone]  [id number] [place] [address] [id number]: [phone]: [phone] [facility]. Denies any adherence concerns.  Care Plan and Follow Up Patient Decision:  Patient agrees to Care Plan and Follow-up.  MDebbora Dus PharmD Clinical Pharmacist [phone]

## 2020-12-09 ENCOUNTER — Telehealth: Payer: Self-pay

## 2020-12-09 DIAGNOSIS — E119 Type 2 diabetes mellitus without complications: Secondary | ICD-10-CM

## 2020-12-09 NOTE — Telephone Encounter (Signed)
Thank you! Once patient schedules follow up with PCP this regimen will be changed.

## 2020-12-09 NOTE — Telephone Encounter (Signed)
Patient interested in changing metformin 1000 mg BID to metformin 500 mg ER 4 tablets in the morning to simplify regimen. Using [employer].  Debbora Dus, PharmD Clinical Pharmacist [facility] [phone]

## 2020-12-09 NOTE — Telephone Encounter (Signed)
Called patient to schedule lab and DM follow up. LVM as per DPR to call back and schedule.

## 2020-12-09 NOTE — Telephone Encounter (Signed)
Could we schedule appointments for the following:  - Lab (A1c) - (lab scheduled 09/2020 cancelled by patient) past due  - PCP diabetes follow up (last visit 07/21/2020), past due for 3 month follow up  Lab Results  Component Value Date/Time   HGBA1C 10.9 (H) 06/11/2020 03:14 PM   HGBA1C 9.9 (A) 12/20/2019 10:55 AM   HGBA1C 8.8 (A) 12/11/2018 04:50 PM   HGBA1C 8.8 (H) 02/05/2018 10:12 AM   Thanks!  Debbora Dus, PharmD Clinical Pharmacist [facility] [phone]

## 2020-12-09 NOTE — Telephone Encounter (Addendum)
Ms. Danielle Edwards is interested in a nurse visit to apply a Freestyle Libre sample. Thanks!  Reviewed insurance benefits. Current plan will not cover unless TID insulin injections. Attempted to contact patient to let her know. Unable to leave VM. Still think she would benefit from 14 day trial for education.  Debbora Dus, PharmD Clinical Pharmacist [facility] at [facility] [phone]

## 2020-12-10 MED ORDER — METFORMIN HCL ER 500 MG PO TB24: 2000.0000 mg | ORAL_TABLET | Freq: Every day | ORAL | 0 refills | Status: DC

## 2020-12-10 NOTE — Patient Instructions (Signed)
Dear Durwin Nora,  It was a pleasure meeting you during our initial appointment on December 08, 2020. Below is a summary of the goals we discussed and components of chronic care management. Please contact me anytime with questions or concerns.   Visit Information  Patient Care Plan: CCM Pharmacy Care Plan    Problem Identified: CHL AMB "PATIENT-SPECIFIC PROBLEM"     Long-Range Goal: Disease Management   Start Date: 12/08/2020  Priority: High  Note:     Current Barriers:  . Unable to achieve control of diabetes   . Not monitoring home BG due to finger pain   Pharmacist Clinical Goal(s):  Marland Kitchen Over the next 30 days, patient will achieve adherence to monitoring guidelines and medication adherence to achieve therapeutic efficacy through collaboration with PharmD and provider.   Interventions: . 1:1 collaboration with Pleas Koch, NP regarding development and update of comprehensive plan of care as evidenced by provider attestation and co-signature . Inter-disciplinary care team collaboration (see longitudinal plan of care) . Comprehensive medication review performed; medication list updated in electronic medical record  Hypertension (BP goal <140/90) -Controlled -Current treatment: . Losartan 100 mg - 1 tablet daily (morning - 4-5 AM) . Amlodipine 40 mg - 1 tablet daily (morning) -Medications previously tried: none reported -Current home readings: none reported, not routinely monitoring  -Denies hypotensive/hypertensive symptoms -Educated on BP goals and benefits of medications for prevention of heart attack, stroke and kidney damage; -Recommended to continue current medication  Hyperlipidemia: (LDL goal < 70) -Controlled -Current treatment: . Atorvastatin 40 mg - 1 tablet daily (morning) -Medications previously tried: none reported -Previously was not taking regularly due to concern for side effects, but taking it regularly now. -Educated on Cholesterol goals;  Benefits of  statin for ASCVD risk reduction; -Recommended to continue current medication  Diabetes (A1c goal <7%) -Query controlled  -Current medications: . Lantus - 35 units daily  . Metformin 1000 mg - 1 tablet twice daily  -Medications previously tried: glimepiride, Jardiance (vaginal discomfort/yeast infections), Januvia (cost) -Current home glucose readings - no longer checking due to finger pain. Discussed proper use of lancing device. She is using lowest prick level to draw blood. She is rotating fingers. She was checking before breakfast and during day or bedtime for several months last fall.  . Fasting glucose: none  . Post prandial glucose: none  -Denies hypoglycemic/hyperglycemic symptoms -Current meal patterns: She does not eat until lunchtime usually (12-1 PM), skips BF. - Reports she lost 25 lbs when she had COVID due to poor appetite.  -Educated on:   Discussed mechanism of metformin and Lantus. Pt reports she occasionally takes an extra metformin due to having a third meal. Discussed avoiding taking more than 2000 mg/day.   Interested in CGM, fingers are hurting from finger sticks. Interested in trying a sample/CGM application. Prefers [employer]. She would like [place] to send rx to DME supplier for price check.  Insulin admin: She is concerned her insulin is ineffective. Reviewed proper admin. No concerns. She has occasional bruising. Rotating sites about 1 inch from belly button. Coming in at 90 degree angle.  Discussed timing of medications. She takes her metformin at different times each day due to changes in meal patterns. Discussed ER metformin as an option.  Counseled to check feet daily and get yearly eye exams  Due for A1c, last PCP visit her SMBG were within goal. Would like to re-eval A1c prior to med changes.  -Recommended to continue current medication; Try  metformin ER to simplify schedule.  Coordinate labs for A1c update. Freestyle Libre sample.  Depression/Anxiety  (Goal: Control symptoms) -Not ideally controlled -Current treatment: . Venlafaxine XR 37.5 mg - 1 capsule daily (not taking) -Medications previously tried/failed: Xanax - PRN anxiety -Pt is not taking currently. She reports she stopped it due to concerns about addiction/withdrawal. Discussed concerns and due to report of daily anxiety symptoms regarding IBS incontinence and nervous eating, recommended she resume daily therapy.  - Reports she used to be on Xanax PRN and took rarely for anxiety around dentist appts.  -Educated on Benefits of medication for symptom control -Recommended to continue current medication - Resume therapy.  GERD (Goal: Control symptoms) -Controlled  -Pt reports symptoms controlled -Current treatment  . Pantoprazole 20 mg - 1 tablet daily PRN triggers -Medications previously tried: none -Recommended to continue current medication  Other:  Oxycodone ER - PRN pain (takes 1-2x/week with bad weather/flares)  Loperamide - PRN loose stools/IBS (takes for 2 days every 2-3 months due to IBS flare)  Promethazine 25 mg - PRN nausea (about 1-2x/month)  Vitamin D3 5000 IU - taking once daily   [place] [employer] 50+ - taking once daily  Magnesium (dose unknown) - taking once daily  Patient Goals/Self-Care Activities . Over the next 30 days, patient will:  - patient will come into office for [facility] sample, lab appointment, and follow up with primary care NP     Ms. Mordecai was given information about Chronic Care Management services today including:  1. CCM service includes personalized support from designated clinical staff supervised by her physician, including individualized plan of care and coordination with other care providers 2. 24/7 contact phone numbers for assistance for urgent and routine care needs. 3. Standard insurance, coinsurance, copays and deductibles apply for chronic care management only during months in which we provide at least 20 minutes  of these services. Most insurances cover these services at 100%, however patients may be responsible for any copay, coinsurance and/or deductible if applicable. This service may help you avoid the need for more expensive face-to-face services. 4. Only one practitioner may furnish and bill the service in a calendar month. 5. The patient may stop CCM services at any time (effective at the end of the month) by phone call to the office staff.  Patient agreed to services and verbal consent obtained.   The patient verbalized understanding of instructions, educational materials, and care plan provided today and agreed to receive a mailed copy of patient instructions, educational materials, and care plan.  Telephone follow up appointment with pharmacy team member scheduled for: January 08, 2021 at 11:30 AM  Debbora Dus, PharmD Clinical Pharmacist [facility] [phone]   Diabetes Mellitus and Nutrition, Adult When you have diabetes, or diabetes mellitus, it is very important to have healthy eating habits because your blood sugar (glucose) levels are greatly affected by what you eat and drink. Eating healthy foods in the right amounts, at about the same times every day, can help you:  Control your blood glucose.  Lower your risk of heart disease.  Improve your blood pressure.  Reach or maintain a healthy weight. What can affect my meal plan? Every person with diabetes is different, and each person has different needs for a meal plan. Your health care provider may recommend that you work with a dietitian to make a meal plan that is best for you. Your meal plan may vary depending on factors such as:  The calories you need.  The medicines you take.  Your weight.  Your blood glucose, blood pressure, and cholesterol levels.  Your activity level.  Other health conditions you have, such as heart or kidney disease. How do carbohydrates affect me? Carbohydrates, also  called carbs, affect your blood glucose level more than any other type of food. Eating carbs naturally raises the amount of glucose in your blood. Carb counting is a method for keeping track of how many carbs you eat. Counting carbs is important to keep your blood glucose at a healthy level, especially if you use insulin or take certain oral diabetes medicines. It is important to know how many carbs you can safely have in each meal. This is different for every person. Your dietitian can help you calculate how many carbs you should have at each meal and for each snack. How does alcohol affect me? Alcohol can cause a sudden decrease in blood glucose (hypoglycemia), especially if you use insulin or take certain oral diabetes medicines. Hypoglycemia can be a life-threatening condition. Symptoms of hypoglycemia, such as sleepiness, dizziness, and confusion, are similar to symptoms of having too much alcohol.  Do not drink alcohol if: ? Your health care provider tells you not to drink. ? You are pregnant, may be pregnant, or are planning to become pregnant.  If you drink alcohol: ? Do not drink on an empty stomach. ? Limit how much you use to:  0-1 drink a day for women.  0-2 drinks a day for men. ? Be aware of how much alcohol is in your drink. In the [place] one drink equals one 12 oz bottle of beer (355 mL), one 5 oz glass of wine (148 mL), or one 1 oz glass of hard liquor (44 mL). ? Keep yourself hydrated with water, diet soda, or unsweetened iced tea.  Keep in mind that regular soda, juice, and other mixers may contain a lot of sugar and must be counted as carbs. What are tips for following this plan? Reading food labels  Start by checking the serving size on the "Nutrition Facts" label of packaged foods and drinks. The amount of calories, carbs, fats, and other nutrients listed on the label is based on one serving of the item. Many items contain more than one serving per package.  Check the  total grams (g) of carbs in one serving. You can calculate the number of servings of carbs in one serving by dividing the total carbs by 15. For example, if a food has 30 g of total carbs per serving, it would be equal to 2 servings of carbs.  Check the number of grams (g) of saturated fats and trans fats in one serving. Choose foods that have a low amount or none of these fats.  Check the number of milligrams (mg) of salt (sodium) in one serving. Most people should limit total sodium intake to less than 2,300 mg per day.  Always check the nutrition information of foods labeled as "low-fat" or "nonfat." These foods may be higher in added sugar or refined carbs and should be avoided.  Talk to your dietitian to identify your daily goals for nutrients listed on the label. Shopping  Avoid buying canned, pre-made, or processed foods. These foods tend to be high in fat, sodium, and added sugar.  Shop around the outside edge of the grocery store. This is where you will most often find fresh fruits and vegetables, bulk grains, fresh meats, and fresh dairy. Cooking  Use low-heat cooking methods, such as baking, instead of high-heat cooking methods like deep frying.  Cook using healthy oils, such as olive, canola, or sunflower oil.  Avoid cooking with butter, cream, or high-fat meats. Meal planning  Eat meals and snacks regularly, preferably at the same times every day. Avoid going long periods of time without eating.  Eat foods that are high in fiber, such as fresh fruits, vegetables, beans, and whole grains. Talk with your dietitian about how many servings of carbs you can eat at each meal.  Eat 4-6 oz (112-168 g) of lean protein each day, such as lean meat, chicken, fish, eggs, or tofu. One ounce (oz) of lean protein is equal to: ? 1 oz (28 g) of meat, chicken, or fish. ? 1 egg. ?  cup (62 g) of tofu.  Eat some foods each day that contain healthy fats, such as avocado, nuts, seeds, and  fish.   What foods should I eat? Fruits Berries. Apples. Oranges. Peaches. Apricots. Plums. Grapes. Mango. Papaya. Pomegranate. Kiwi. Cherries. Vegetables Lettuce. Spinach. Leafy greens, including kale, chard, collard greens, and mustard greens. Beets. Cauliflower. Cabbage. Broccoli. Carrots. Green beans. Tomatoes. Peppers. Onions. Cucumbers. Brussels sprouts. Grains Whole grains, such as whole-wheat or whole-grain bread, crackers, tortillas, cereal, and pasta. Unsweetened oatmeal. Quinoa. Brown or wild rice. Meats and other proteins Seafood. Poultry without skin. Lean cuts of poultry and beef. Tofu. Nuts. Seeds. Dairy Low-fat or fat-free dairy products such as milk, yogurt, and cheese. The items listed above may not be a complete list of foods and beverages you can eat. Contact a dietitian for more information. What foods should I avoid? Fruits Fruits canned with syrup. Vegetables Canned vegetables. Frozen vegetables with butter or cream sauce. Grains Refined white flour and flour products such as bread, pasta, snack foods, and cereals. Avoid all processed foods. Meats and other proteins Fatty cuts of meat. Poultry with skin. Breaded or fried meats. Processed meat. Avoid saturated fats. Dairy Full-fat yogurt, cheese, or milk. Beverages Sweetened drinks, such as soda or iced tea. The items listed above may not be a complete list of foods and beverages you should avoid. Contact a dietitian for more information. Questions to ask a health care provider  Do I need to meet with a diabetes educator?  Do I need to meet with a dietitian?  What number can I call if I have questions?  When are the best times to check my blood glucose? Where to find more information:  American Diabetes Association: diabetes.org  Academy of Nutrition and Dietetics: www.eatright.[employer] of Diabetes and Digestive and Kidney Diseases: [employer]  Association of Diabetes Care and  Education Specialists: www.diabeteseducator.org Summary  It is important to have healthy eating habits because your blood sugar (glucose) levels are greatly affected by what you eat and drink.  A healthy meal plan will help you control your blood glucose and maintain a healthy lifestyle.  Your health care provider may recommend that you work with a dietitian to make a meal plan that is best for you.  Keep in mind that carbohydrates (carbs) and alcohol have immediate effects on your blood glucose levels. It is important to count carbs and to use alcohol carefully. This information is not intended to replace advice given to you by your health care provider. Make sure you discuss any questions you have with your health care provider. Document Revised: 08/27/2019 Document Reviewed: 08/27/2019 Elsevier Patient Education  2021 [employer].

## 2020-12-10 NOTE — Progress Notes (Signed)
I have personally reviewed this encounter including the documentation in this note and have collaborated with the care management provider regarding care management and care coordination activities to include development and update of the comprehensive care plan. I am certifying that I agree with the content of this note and encounter as supervising physician.    

## 2020-12-10 NOTE — Telephone Encounter (Signed)
Noted, Rx changed.  Will see patient as scheduled.

## 2020-12-10 NOTE — Telephone Encounter (Signed)
Called patient and scheduled 3/15.

## 2020-12-10 NOTE — Telephone Encounter (Signed)
Patient scheduled for 3/15 for follow up/ A1C. Sending to PCP as FYI.

## 2020-12-15 ENCOUNTER — Ambulatory Visit: Payer: Medicare HMO | Admitting: Primary Care

## 2020-12-17 ENCOUNTER — Ambulatory Visit: Payer: Medicare HMO | Admitting: Primary Care

## 2020-12-23 NOTE — Telephone Encounter (Signed)
Office is still waiting on arrival of samples from sales representative. Samples were shipped on 12/14/20.  Debbora Dus, PharmD Clinical Pharmacist [facility] at [facility] [phone]

## 2020-12-23 NOTE — Chronic Care Management (AMB) (Signed)
Contacted patient to inform her office is still waiting for FreeStyle Libre to be delivered. Patient states she has an appointment in office 12/25/20 and is hopeful it will be there by then.    Follow-Up:  Pharmacist Review  Debbora Dus, CPP notified  Margaretmary Dys, [place] [phone]  Total time spent for month:  CPA: 1:08:00

## 2020-12-23 NOTE — Telephone Encounter (Signed)
Patient called back and appointment was completed.

## 2020-12-24 ENCOUNTER — Ambulatory Visit: Payer: Medicare HMO | Admitting: Primary Care

## 2020-12-25 ENCOUNTER — Ambulatory Visit (INDEPENDENT_AMBULATORY_CARE_PROVIDER_SITE_OTHER): Payer: Medicare HMO | Admitting: Primary Care

## 2020-12-25 ENCOUNTER — Other Ambulatory Visit: Payer: Self-pay

## 2020-12-25 VITALS — BP 120/78 | HR 78 | Temp 97.6°F | Ht 66.0 in | Wt 266.0 lb

## 2020-12-25 DIAGNOSIS — E559 Vitamin D deficiency, unspecified: Secondary | ICD-10-CM | POA: Diagnosis not present

## 2020-12-25 DIAGNOSIS — E119 Type 2 diabetes mellitus without complications: Secondary | ICD-10-CM

## 2020-12-25 DIAGNOSIS — I10 Essential (primary) hypertension: Secondary | ICD-10-CM | POA: Diagnosis not present

## 2020-12-25 DIAGNOSIS — Z800 Family history of malignant neoplasm of digestive organs: Secondary | ICD-10-CM | POA: Diagnosis not present

## 2020-12-25 DIAGNOSIS — Z86010 Personal history of colonic polyps: Secondary | ICD-10-CM

## 2020-12-25 DIAGNOSIS — N183 Chronic kidney disease, stage 3 unspecified: Secondary | ICD-10-CM

## 2020-12-25 DIAGNOSIS — E785 Hyperlipidemia, unspecified: Secondary | ICD-10-CM | POA: Diagnosis not present

## 2020-12-25 DIAGNOSIS — Z6841 Body Mass Index (BMI) 40.0 and over, adult: Secondary | ICD-10-CM | POA: Diagnosis not present

## 2020-12-25 DIAGNOSIS — F419 Anxiety disorder, unspecified: Secondary | ICD-10-CM | POA: Diagnosis not present

## 2020-12-25 DIAGNOSIS — K219 Gastro-esophageal reflux disease without esophagitis: Secondary | ICD-10-CM

## 2020-12-25 DIAGNOSIS — F32A Depression, unspecified: Secondary | ICD-10-CM

## 2020-12-25 LAB — VITAMIN D 25 HYDROXY (VIT D DEFICIENCY, FRACTURES): VITD: 54.23 ng/mL (ref 30.00–100.00)

## 2020-12-25 LAB — POCT GLYCOSYLATED HEMOGLOBIN (HGB A1C): Hemoglobin A1C: 9.8 % — AB (ref 4.0–5.6)

## 2020-12-25 LAB — BASIC METABOLIC PANEL
BUN: 28 mg/dL — ABNORMAL HIGH (ref 6–23)
CO2: 27 mEq/L (ref 19–32)
Calcium: 10 mg/dL (ref 8.4–10.5)
Chloride: 102 mEq/L (ref 96–112)
Creatinine, Ser: 1.26 mg/dL — ABNORMAL HIGH (ref 0.40–1.20)
GFR: 44.74 mL/min — ABNORMAL LOW (ref >60.00)
Glucose, Bld: 302 mg/dL — ABNORMAL HIGH (ref 70–99)
Potassium: 4.8 mEq/L (ref 3.5–5.1)
Sodium: 138 mEq/L (ref 135–145)

## 2020-12-25 LAB — LIPID PANEL
Cholesterol: 134 mg/dL (ref 0–200)
HDL: 49 mg/dL (ref >39.00)
LDL Cholesterol: 65 mg/dL (ref 0–99)
NonHDL: 84.69
Total CHOL/HDL Ratio: 3
Triglycerides: 96 mg/dL (ref 0.0–149.0)
VLDL: 19.2 mg/dL (ref 0.0–40.0)

## 2020-12-25 MED ORDER — TRULICITY 0.75 MG/0.5ML ~~LOC~~ SOAJ: 0.7500 mg | SUBCUTANEOUS | 0 refills | Status: DC

## 2020-12-25 NOTE — Assessment & Plan Note (Signed)
Compliant to atorvastatin 40 mg, continue same.  Lipid panel from September 2021 reviewed.

## 2020-12-25 NOTE — Assessment & Plan Note (Signed)
Doing well on pantoprazole 40 mg.  Continue same.

## 2020-12-25 NOTE — Assessment & Plan Note (Signed)
Completed colonoscopy in October 2021, due in 2024.

## 2020-12-25 NOTE — Assessment & Plan Note (Signed)
Colonoscopy recently completed in October 2021, due again in 2024.

## 2020-12-25 NOTE — Assessment & Plan Note (Signed)
She has yet to resume venlafaxine ER 37.5 mg but plans on doing so.   She doesn't need refills at this time, but will notify when ready.

## 2020-12-25 NOTE — Assessment & Plan Note (Signed)
Checking calcium, vitamin D, and PTH today.

## 2020-12-25 NOTE — Assessment & Plan Note (Signed)
Slightly improved, but still above goal today at 9.8.   Unfortunately she is not checking her glucose readings, cannot afford freestyle libre. We will provide her with Dexcom samples today. She will start checking.  Continue Lantus 35 units, add Trulicity 1.77 mg weekly.  Continue Metformin XR 2000 mg daily.  Strongly advised weight loss.   Follow up in three months.

## 2020-12-25 NOTE — Progress Notes (Signed)
Subjective:    Patient ID: Danielle Edwards, female    DOB: May 09, 1956, 65 y.o.   MRN: [id number]  HPI  Glema Takaki is a very pleasant 65 y.o. female with a history of hypertension, CKD, type 2 diabetes, anxiety and depression, fatigue, anemia who presents today for follow-up of chronic conditions.  1) Type 2 Diabetes:  Current medications include: Metformin XR 500 mg, 2000 mg daily.  Lantus insulin 35 units daily. She is actually injecting 36 units daily.  She is checking her blood glucose 0 times daily as it hurts her fingers. FreeStyle Elenor Legato is too expensive.   Last A1C: 10.9 in September 2021 Last Eye Exam: Up-to-date Last Foot Exam: Due Pneumonia Vaccination: 2016 ACE/ARB: Losartan Statin: Atorvastatin  2) Essential Hypertension/Hyperlipidemia: Currently managed on losartan 100 mg and amlodipine 10 mg.  Also managed on atorvastatin 40 mg for hyperlipidemia.  She denies chest pain, dizziness, shortness of breath.   BP Readings from Last 3 Encounters:  12/25/20 120/78  07/21/20 118/62  07/17/20 117/67     3) CKD: Following with nephrology, no visit since last year. Managed on losartan. She plans on rescheduling.   4) Anxiety and Depression: Currently managed on venlafaxine ER 37.5 mg. She's not yet started back taking this medication, plans on resuming as this medication was effective.     Review of Systems  Eyes: Negative for visual disturbance.  Respiratory: Negative for shortness of breath.   Cardiovascular: Negative for chest pain.  Neurological: Negative for dizziness.         Past Medical History:  Diagnosis Date  . Allergy    seasonal allergies  . Anemia    hx of IDA  . Anxiety    on meds  . Arthritis    PRN meds-osteoarthritis  . Cataract    sx to remove  . Chronic kidney disease    CKD-stage 3  . Chronic pain   . Depression    on meds  . Diabetes type 2, controlled ([place])    on meds  . Fibromyalgia   . GERD (gastroesophageal reflux  disease)    on meds  . Headache   . History of colonic polyps 1999   Benign  . Hx of adenomatous polyp of colon 09/27/2000   2001 - diminutive adenoma [place] 2007 no polyps - [place]  . Hyperlipidemia    on meds  . Hypertension    on meds  . Hypothyroidism    not on meds at this time  . IBS (irritable bowel syndrome)    Diarrhea type  . Neuromuscular disorder ([place])   . Vitamin D deficiency     Social History   Socioeconomic History  . Marital status: Married    Spouse name: Not on file  . Number of children: Not on file  . Years of education: Not on file  . Highest education level: Not on file  Occupational History  . Not on file  Tobacco Use  . Smoking status: Never Smoker  . Smokeless tobacco: Never Used  Vaping Use  . Vaping Use: Never used  Substance and Sexual Activity  . Alcohol use: Yes    Alcohol/week: 0.0 standard drinks    Comment: rarely  . Drug use: No  . Sexual activity: Not on file  Other Topics Concern  . Not on file  Social History Narrative   Married.   3 children.   Retired, worked at [employer] in [place], [place].   Enjoys being with her grandchildren, being  with her friends.   Social Determinants of Health   Financial Resource Strain: Low Risk   . Difficulty of Paying Living Expenses: Not very hard  Food Insecurity: Not on file  Transportation Needs: Not on file  Physical Activity: Not on file  Stress: Not on file  Social Connections: Not on file  Intimate Partner Violence: Not on file    Past Surgical History:  Procedure Laterality Date  . CHOLECYSTECTOMY  1995  . COLONOSCOPY  multiple  . COLONOSCOPY  2016   TA's  . NASAL SINUS SURGERY  2001  . POLYPECTOMY     TA's  . TONSILLECTOMY AND ADENOIDECTOMY    . VAGINAL HYSTERECTOMY     Complete  . WISDOM TOOTH EXTRACTION      Family History  Problem Relation Age of Onset  . Colon cancer Mother 34  . Lung cancer Mother 23  . Skin cancer Mother   . Colon polyps Mother 76  .  Heart disease Father   . Diabetes Father   . Skin cancer Father   . Colon polyps Brother 70  . Stomach cancer Paternal Grandmother 25  . Breast cancer Neg Hx   . Esophageal cancer Neg Hx   . Rectal cancer Neg Hx     No Known Allergies  Current Outpatient Medications on File Prior to Visit  Medication Sig Dispense Refill  . Accu-Chek FastClix Lancets MISC USE AS INSTRUCTED TO CHECK BLOOD SUGAR UP TO 3 TIMES DAILY 306 each 1  . amLODipine (NORVASC) 10 MG tablet TAKE 1 TABLET BY MOUTH ONCE DAILY FOR BLOOD PRESSURE 30 tablet 0  . atorvastatin (LIPITOR) 40 MG tablet TAKE ONE TABLET BY MOUTH AT BEDTIME FOR CHOLESTEROL 90 tablet 1  . blood glucose meter kit and supplies KIT Dispense based on patient and insurance preference. Use up to four times daily as directed. (FOR ICD-9 250.00, 250.01). 1 each 0  . Blood Glucose Monitoring Suppl (ACCU-CHEK AVIVA PLUS) w/Device KIT Use as instructed to test blood sugar 3 times daily 1 kit 0  . Cholecalciferol (VITAMIN D3) 125 MCG (5000 UT) CAPS Take 5,000 Units by mouth daily.    Marland Kitchen glucose blood (ACCU-CHEK AVIVA) test strip Use as instructed to test blood sugar up to 3 times daily 300 each 1  . Insulin Pen Needle 32G X 6 MM MISC Use as instructed to inject insulin daily at bedtime 100 each 3  . LANTUS SOLOSTAR 100 UNIT/ML Solostar Pen INJECT 35 UNITS INTO THE SKIN ONCE DAILY. 15 mL 3  . loperamide (IMODIUM A-D) 2 MG tablet Take 2 mg by mouth 4 (four) times daily as needed for diarrhea or loose stools.    Marland Kitchen losartan (COZAAR) 100 MG tablet TAKE 1 TABLET BY MOUTH ONCE DAILY FOR BLOOD PRESSURE 30 tablet 0  . Magnesium 200 MG TABS Take by mouth daily.    . metFORMIN (GLUCOPHAGE XR) 500 MG 24 hr tablet Take 4 tablets (2,000 mg total) by mouth daily with breakfast. For diabetes. 360 tablet 0  . Multiple Vitamins-Minerals (CENTRUM SILVER ULTRA WOMENS PO) Take by mouth daily.    Marland Kitchen oxyCODONE (OXY IR/ROXICODONE) 5 MG immediate release tablet Take 5 mg by mouth 4 (four)  times daily as needed.    . pantoprazole (PROTONIX) 20 MG tablet Take 1 tablet (20 mg total) by mouth daily. (Patient taking differently: Take 20 mg by mouth daily as needed.) 90 tablet 0  . promethazine (PHENERGAN) 25 MG tablet Take 25 mg by mouth every 6 (  six) hours as needed. Reported on 09/21/2015  1  . venlafaxine XR (EFFEXOR XR) 37.5 MG 24 hr capsule Take 1 capsule (37.5 mg total) by mouth daily with breakfast. For anxiety and depression. 90 capsule 3   No current facility-administered medications on file prior to visit.    BP 120/78   Pulse 78   Temp 97.6 F (36.4 C) (Temporal)   Ht 5' 6"  (1.676 m)   Wt 266 lb (120.7 kg)   SpO2 96%   BMI 42.93 kg/m  Objective:   Physical Exam Cardiovascular:     Rate and Rhythm: Normal rate and regular rhythm.  Pulmonary:     Effort: Pulmonary effort is normal.     Breath sounds: Normal breath sounds.  Musculoskeletal:     Cervical back: Neck supple.  Skin:    General: Skin is warm and dry.  Psychiatric:        Mood and Affect: Mood normal.           Assessment & Plan:      This visit occurred during the SARS-CoV-2 public health emergency.  Safety protocols were in place, including screening questions prior to the visit, additional usage of staff PPE, and extensive cleaning of exam room while observing appropriate contact time as indicated for disinfecting solutions.

## 2020-12-25 NOTE — Assessment & Plan Note (Signed)
Due for follow up with nephrologist, she will schedule. Continue losartan.   CMP pending today.

## 2020-12-25 NOTE — Assessment & Plan Note (Signed)
Well controlled in the office today, continue amlodipine 10 mg and losartan 100 mg.   CMP pending.

## 2020-12-25 NOTE — Patient Instructions (Addendum)
Stop by the lab prior to leaving today. I will notify you of your results once received.   Start dulaglutide 0.75 mg weekly for diabetes. Inject into the skin once weekly.  Continue Lantus 35 units daily for now.  Start checking your blood sugar levels.  Appropriate times to check your blood sugar levels are:  -Before any meal (breakfast, lunch, dinner) -Two hours after any meal (breakfast, lunch, dinner) -Bedtime  Call me if readings remain above 150 after two weeks of checking blood sugars.  Please schedule a follow up appointment in 3 months.  It was a pleasure to see you today!

## 2020-12-26 ENCOUNTER — Telehealth: Payer: Self-pay

## 2020-12-26 NOTE — Telephone Encounter (Signed)
Hyun Macadam Key: [id number] - PA Case ID: [id number] - Rx #: [id number] Need help? Call us at [phone] Status Sent to [place] 14 Day Reader device Form [facility] Electronic [place] [phone] [place] ACCU-CHEK OR TRUE METRIX 03C

## 2020-12-28 LAB — PARATHYROID HORMONE, INTACT (NO CA): PTH: 32 pg/mL (ref 16–77)

## 2020-12-29 ENCOUNTER — Telehealth: Payer: Self-pay | Admitting: Primary Care

## 2020-12-29 NOTE — Telephone Encounter (Signed)
Danielle Edwards called in she is need a new meter and she needs to go to [employer]. It was sent to [facility] but it doesn't need to go there. And her meter is broken and she cant give Anda Kraft the readings.  And wanted to check on trial for the libra she was told Sharyn Lull that the Elenor Legato would come to the office.   Please advise

## 2020-12-31 NOTE — Telephone Encounter (Signed)
Who would I check with about this sample?

## 2020-12-31 NOTE — Telephone Encounter (Signed)
Samples have not been delivered yet.

## 2021-01-05 ENCOUNTER — Other Ambulatory Visit: Payer: Self-pay

## 2021-01-05 ENCOUNTER — Other Ambulatory Visit: Payer: Self-pay | Admitting: Primary Care

## 2021-01-05 DIAGNOSIS — I10 Essential (primary) hypertension: Secondary | ICD-10-CM

## 2021-01-05 DIAGNOSIS — Z794 Long term (current) use of insulin: Secondary | ICD-10-CM

## 2021-01-05 MED ORDER — AMLODIPINE BESYLATE 10 MG PO TABS: 1.0000 | ORAL_TABLET | Freq: Every day | ORAL | 0 refills | Status: DC

## 2021-01-05 MED ORDER — BLOOD GLUCOSE METER KIT: PACK | 0 refills | Status: DC

## 2021-01-05 MED ORDER — LOSARTAN POTASSIUM 100 MG PO TABS: 1.0000 | ORAL_TABLET | Freq: Every day | ORAL | 0 refills | Status: DC

## 2021-01-05 NOTE — Telephone Encounter (Signed)
Patient called in she is needing the accucheck kit and needing it to go [facility]. And she would like 90 day supply for amlodipine, and losartan she is out of her medication. And she does not use [facility]   Please advise

## 2021-01-05 NOTE — Telephone Encounter (Signed)
Called patient let know have sent in scrips as requested. Will call if any issues.

## 2021-01-08 ENCOUNTER — Telehealth: Payer: Medicare HMO

## 2021-01-08 ENCOUNTER — Telehealth: Payer: Self-pay

## 2021-01-08 NOTE — Progress Notes (Deleted)
Chronic Care Management Pharmacy Note  01/08/2021 Name:  Nyelah Emmerich MRN:  [id number] DOB:  November 11, 1955  Subjective: Keyosha Tiedt is an 65 y.o. year old female who is a primary patient of Pleas Koch, NP.  The CCM team was consulted for assistance with disease management and care coordination needs.    Engaged with patient by telephone for initial visit in response to provider referral for pharmacy case management and/or care coordination services.   CCM consent 10/28/20  Consent to Services:  The patient was given the following information about Chronic Care Management services today, agreed to services, and gave verbal consent: 1. CCM service includes personalized support from designated clinical staff supervised by the primary care provider, including individualized plan of care and coordination with other care providers 2. 24/7 contact phone numbers for assistance for urgent and routine care needs. 3. Service will only be billed when office clinical staff spend 20 minutes or more in a month to coordinate care. 4. Only one practitioner may furnish and bill the service in a calendar month. 5.The patient may stop CCM services at any time (effective at the end of the month) by phone call to the office staff. 6. The patient will be responsible for cost sharing (co-pay) of up to 20% of the service fee (after annual deductible is met). Patient agreed to services and consent obtained.  Patient Care Team: Pleas Koch, NP as PCP - General (Nurse Practitioner) Debbora Dus, [facility] as Pharmacist (Pharmacist)  Recent office visits:  12/25/20 - PCP - Start Trulicity 7.35 mg weekly. Continue Lantus 35 units daily metformin XR 2000 mg daily. Dexcom sample provided. Check BG. RTC 3 months.   07/21/20 - PCP - DM f/u, She is checking her blood glucose 1-2 times most days. Fasting blood sugars have been: 90-140's. Afternoon noon readings before dinner: 150-160's. Continue Lantus 35 units and  Metformin 1000 mg BID. Encouraged to continue to improve diet and exercise. Will follow up in 2 months & recheck A1c at that time  06/11/20 - Lab notes - Thyroid, cholesterol look okay. Diabetes is uncontrolled, worse than last visit. She must start checking her blood sugars at least twice daily. I would like to increase her Lantus insulin to 35 units daily. I would also like to see her back either virtually or in person for diabetes follow-up in 1 month.   06/10/20 - PCP - follow up of diabetes and Covid-19. Plan, Call the main office line to schedule a nurse visit for blood pressure and weight check, and a lab appointment. Start checking your blood sugar levels.   Recent consult visits: None in past 6 months  Hospital visits: None in previous 6 months  Objective:  Lab Results  Component Value Date   CREATININE 1.26 (H) 12/25/2020   BUN 28 (H) 12/25/2020   GFR 44.74 (L) 12/25/2020   NA 138 12/25/2020   K 4.8 12/25/2020   CALCIUM 10.0 12/25/2020   CO2 27 12/25/2020   Lab Results  Component Value Date/Time   HGBA1C 9.8 (A) 12/25/2020 10:53 AM   HGBA1C 10.9 (H) 06/11/2020 03:14 PM   HGBA1C 9.9 (A) 12/20/2019 10:55 AM   HGBA1C 8.8 (H) 02/05/2018 10:12 AM   GFR 44.74 (L) 12/25/2020 11:25 AM   GFR 53.15 (L) 06/11/2020 03:14 PM   MICROALBUR 4.4 (H) 08/05/2016 11:14 AM   MICROALBUR 2.0 (H) 04/07/2015 09:15 AM    Last diabetic Eye exam: seems to be past due per chart review  Last  diabetic Foot exam: seems to be past due per chart review  Lab Results  Component Value Date   CHOL 134 12/25/2020   HDL 49.00 12/25/2020   LDLCALC 65 12/25/2020   LDLDIRECT 112.0 02/05/2018   TRIG 96.0 12/25/2020   CHOLHDL 3 12/25/2020    Hepatic Function Latest Ref Rng & Units 06/11/2020 12/11/2018 01/23/2018  Total Protein 6.0 - 8.3 g/dL 7.5 7.9 7.5  Albumin 3.5 - 5.2 g/dL 4.0 4.2 3.8  AST 0 - 37 U/L 29 30 23   ALT 0 - 35 U/L 31 29 26   Alk Phosphatase 39 - 117 U/L 77 88 93  Total Bilirubin 0.2 - 1.2  mg/dL 0.4 0.2 0.2  Bilirubin, Direct 0.0 - 0.3 mg/dL - - -    Lab Results  Component Value Date/Time   TSH 2.29 06/11/2020 03:14 PM   TSH 3.05 09/17/2019 03:23 PM    CBC Latest Ref Rng & Units 12/20/2019 01/23/2018 06/29/2015  WBC 4.0 - 10.5 K/uL 7.3 10.3 7.9  Hemoglobin 12.0 - 15.0 g/dL 11.1(L) 9.7(L) 10.6(L)  Hematocrit 36.0 - 46.0 % 34.1(L) 31.4(L) 33.7(L)  Platelets 150.0 - 400.0 K/uL 253.0 350.0 302.0    Lab Results  Component Value Date/Time   VD25OH 54.23 12/25/2020 11:25 AM   VD25OH 37.22 10/30/2017 09:33 AM    Clinical ASCVD: No  The 10-year ASCVD risk score Mikey Bussing DC Jr., et al., 2013) is: 10.6%   Values used to calculate the score:     Age: 74 years     Sex: Female     Is Non-Hispanic African American: No     Diabetic: Yes     Tobacco smoker: No     Systolic Blood Pressure: 063 mmHg     Is BP treated: Yes     HDL Cholesterol: 49 mg/dL     Total Cholesterol: 134 mg/dL    Depression screen [facility] 2/9 12/08/2020 09/12/2019  Decreased Interest 1 0  Down, Depressed, Hopeless 0 0  PHQ - 2 Score 1 0  Altered sleeping - 0  Tired, decreased energy - 0  Change in appetite - 0  Feeling bad or failure about yourself  - 0  Trouble concentrating - 0  Moving slowly or fidgety/restless - 0  Suicidal thoughts - 0  PHQ-9 Score - 0  Difficult doing work/chores - Not difficult at all     Social History   Tobacco Use  Smoking Status Never Smoker  Smokeless Tobacco Never Used   BP Readings from Last 3 Encounters:  12/25/20 120/78  07/21/20 118/62  07/17/20 117/67   Pulse Readings from Last 3 Encounters:  12/25/20 78  07/21/20 96  07/17/20 70   Wt Readings from Last 3 Encounters:  12/25/20 266 lb (120.7 kg)  07/21/20 258 lb (117 kg)  07/17/20 253 lb (114.8 kg)    Assessment/Interventions: Review of patient past medical history, allergies, medications, health status, including review of consultants reports, laboratory and other test data, was performed as part of  comprehensive evaluation and provision of chronic care management services.   SDOH:  (Social Determinants of Health) assessments and interventions performed: Yes   CCM Care Plan  No Known Allergies  Medications Reviewed Today    Reviewed by Pleas Koch, NP (Nurse Practitioner) on 12/25/20 at 1118  Med List Status: <None>  Medication Order Taking? Sig Documenting Provider Last Dose Status Informant  Accu-Chek FastClix Lancets MISC [id number] Yes USE AS INSTRUCTED TO CHECK BLOOD SUGAR UP TO 3 TIMES DAILY Carlis Abbott,  Leticia Penna, NP Taking Active   amLODipine (NORVASC) 10 MG tablet [id number] Yes TAKE 1 TABLET BY MOUTH ONCE DAILY FOR BLOOD PRESSURE Pleas Koch, NP Taking Active   atorvastatin (LIPITOR) 40 MG tablet [id number] Yes TAKE ONE TABLET BY MOUTH AT BEDTIME FOR CHOLESTEROL Pleas Koch, NP Taking Active   blood glucose meter kit and supplies KIT [id number] Yes Dispense based on patient and insurance preference. Use up to four times daily as directed. (FOR ICD-9 250.00, 250.01). Pleas Koch, NP Taking Active   Blood Glucose Monitoring Suppl (ACCU-CHEK AVIVA PLUS) w/Device KIT [id number] Yes Use as instructed to test blood sugar 3 times daily Pleas Koch, NP Taking Active   Cholecalciferol (VITAMIN D3) 125 MCG (5000 UT) CAPS [id number] Yes Take 5,000 Units by mouth daily. [provider] Taking Active   Dulaglutide (TRULICITY) 5.73 UK/0.2RK SOPN [id number] Yes Inject 0.75 mg into the skin once a week. For diabetes. Pleas Koch, NP  Active   glucose blood (ACCU-CHEK AVIVA) test strip [id number] Yes Use as instructed to test blood sugar up to 3 times daily Pleas Koch, NP Taking Active   Insulin Pen Needle 32G X 6 MM MISC [id number] Yes Use as instructed to inject insulin daily at bedtime Pleas Koch, NP Taking Active   LANTUS SOLOSTAR 100 UNIT/ML Solostar Pen [id number] Yes INJECT 35 UNITS INTO THE SKIN ONCE DAILY. Pleas Koch, NP Taking  Active   loperamide (IMODIUM A-D) 2 MG tablet [id number] Yes Take 2 mg by mouth 4 (four) times daily as needed for diarrhea or loose stools. [provider] Taking Active   losartan (COZAAR) 100 MG tablet [id number] Yes TAKE 1 TABLET BY MOUTH ONCE DAILY FOR BLOOD PRESSURE Pleas Koch, NP Taking Active   Magnesium 200 MG TABS [id number] Yes Take by mouth daily. [provider] Taking Active Self  metFORMIN (GLUCOPHAGE XR) 500 MG 24 hr tablet [id number] Yes Take 4 tablets (2,000 mg total) by mouth daily with breakfast. For diabetes. Pleas Koch, NP Taking Active   Multiple Vitamins-Minerals (CENTRUM SILVER ULTRA WOMENS PO) [id number] Yes Take by mouth daily. [provider] Taking Active Self  pantoprazole (PROTONIX) 40 MG tablet [id number]   [provider]  Active   promethazine (PHENERGAN) 25 MG tablet [id number] Yes Take 25 mg by mouth every 6 (six) hours as needed. Reported on 09/21/2015 [provider] Taking Active            Med Note Cristela Felt, [facility]   Fri Aug 05, 2016 10:19 AM)    venlafaxine XR (EFFEXOR XR) 37.5 MG 24 hr capsule [id number] Yes Take 1 capsule (37.5 mg total) by mouth daily with breakfast. For anxiety and depression. Pleas Koch, NP Taking Active   XTAMPZA ER 13.5 [facility] C12A [id number]   [provider]  Active           Patient Active Problem List   Diagnosis Date Noted  . COVID-19 virus infection 06/02/2020  . Hypercalcemia 02/20/2020  . Localized edema 02/20/2020  . History of attention deficit disorder 10/18/2019  . Fatigue 09/17/2019  . CKD (chronic kidney disease) stage 3, GFR 30-59 ml/min (HCC) 12/11/2018  . Anemia 02/06/2018  . Vitamin D deficiency 08/05/2016  . Recurrent sinusitis 10/30/2015  . Encounter for annual general medical examination with abnormal findings in adult 04/13/2015  . Hyperlipidemia LDL goal <100 04/13/2015  . Esophageal reflux 03/23/2015  . Family history of colon  cancer in mother 02/23/2015  .  Family history of malignant neoplasm of digestive organs 02/23/2015  . Essential hypertension 02/10/2015  . DM type 2 (diabetes mellitus, type 2) ([place]) 02/10/2015  . Hypothyroidism 02/10/2015  . Chronic pain 02/10/2015  . Anxiety and depression 02/10/2015  . Hx of adenomatous polyp of colon 09/27/2000    Immunization History  Administered Date(s) Administered  . DTaP 12/01/2013  . Fluad Quad(high Dose 65+) 07/21/2020  . Influenza Inj Mdck Quad Pf 11/03/2017  . Influenza,inj,Quad PF,6+ Mos 06/29/2015, 05/16/2018, 09/11/2019  . Influenza-Unspecified 09/11/2019  . Janssen (J&J) SARS-COV-2 Vaccination 12/11/2019  . PFIZER(Purple Top)SARS-COV-2 Vaccination 09/19/2020  . Pneumococcal Polysaccharide-23 04/13/2015  . Zoster Recombinat (Shingrix) 05/21/2018, 09/12/2018    Conditions to be addressed/monitored:  Hypertension, Hyperlipidemia, Diabetes, GERD, Chronic Kidney Disease, Hypothyroidism, Depression and Anxiety  There are no care plans that you recently modified to display for this patient.   Patient Care Plan: CCM Pharmacy Care Plan    Problem Identified: CHL AMB "PATIENT-SPECIFIC PROBLEM"     Long-Range Goal: Disease Management   Start Date: 12/08/2020  Priority: High  Note:     Current Barriers:  . Unable to achieve control of diabetes   . Not monitoring home BG due to finger pain   Pharmacist Clinical Goal(s):  Marland Kitchen Over the next 30 days, patient will achieve adherence to monitoring guidelines and medication adherence to achieve therapeutic efficacy through collaboration with PharmD and provider.   Interventions: . 1:1 collaboration with Pleas Koch, NP regarding development and update of comprehensive plan of care as evidenced by provider attestation and co-signature . Inter-disciplinary care team collaboration (see longitudinal plan of care) . Comprehensive medication review performed; medication list updated in electronic medical  record  Hypertension (BP goal <140/90) -Controlled -Current treatment: . Losartan 100 mg - 1 tablet daily (morning - 4-5 AM) . Amlodipine 40 mg - 1 tablet daily (morning) -Medications previously tried: none reported -Current home readings: none reported, not routinely monitoring  -Denies hypotensive/hypertensive symptoms -Educated on BP goals and benefits of medications for prevention of heart attack, stroke and kidney damage; -Recommended to continue current medication  Hyperlipidemia: (LDL goal < 70) -Controlled -Current treatment: . Atorvastatin 40 mg - 1 tablet daily (morning) -Medications previously tried: none reported -Previously was not taking regularly due to concern for side effects, but taking it regularly now. -Educated on Cholesterol goals;  Benefits of statin for ASCVD risk reduction; -Recommended to continue current medication  Diabetes (A1c goal <7%) -Query controlled  -Current medications: . Lantus - 35 units daily  . Metformin 1000 mg - 1 tablet twice daily  -Medications previously tried: glimepiride, Jardiance (vaginal discomfort/yeast infections), Januvia (cost) -Current home glucose readings - no longer checking due to finger pain. Discussed proper use of lancing device. She is using lowest prick level to draw blood. She is rotating fingers. She was checking before breakfast and during day or bedtime for several months last fall.  . Fasting glucose: none  . Post prandial glucose: none  -Denies hypoglycemic/hyperglycemic symptoms -Current meal patterns: She does not eat until lunchtime usually (12-1 PM), skips BF. - Reports she lost 25 lbs when she had COVID due to poor appetite.  -Educated on:   Discussed mechanism of metformin and Lantus. Pt reports she occasionally takes an extra metformin due to having a third meal. Discussed avoiding taking more than 2000 mg/day.   Interested in CGM, fingers are hurting from finger sticks. Interested in trying a  sample/CGM application. Prefers [employer]. She would like [place] to send  rx to DME supplier for price check.  Insulin admin: She is concerned her insulin is ineffective. Reviewed proper admin. No concerns. She has occasional bruising. Rotating sites about 1 inch from belly button. Coming in at 90 degree angle.  Discussed timing of medications. She takes her metformin at different times each day due to changes in meal patterns. Discussed ER metformin as an option.  Counseled to check feet daily and get yearly eye exams  Due for A1c, last PCP visit her SMBG were within goal. Would like to re-eval A1c prior to med changes.  -Recommended to continue current medication; Try metformin ER to simplify schedule.  Coordinate labs for A1c update. Freestyle Libre sample.  Depression/Anxiety (Goal: Control symptoms) -Not ideally controlled -Current treatment: . Venlafaxine XR 37.5 mg - 1 capsule daily (not taking) -Medications previously tried/failed: Xanax - PRN anxiety -Pt is not taking currently. She reports she stopped it due to concerns about addiction/withdrawal. Discussed concerns and due to report of daily anxiety symptoms regarding IBS incontinence and nervous eating, recommended she resume daily therapy.  - Reports she used to be on Xanax PRN and took rarely for anxiety around dentist appts.  -Educated on Benefits of medication for symptom control -Recommended to continue current medication - Resume therapy.  GERD (Goal: Control symptoms) -Controlled  -Pt reports symptoms controlled -Current treatment  . Pantoprazole 20 mg - 1 tablet daily PRN triggers -Medications previously tried: none -Recommended to continue current medication  Other:  Oxycodone ER - PRN pain (takes 1-2x/week with bad weather/flares)  Loperamide - PRN loose stools/IBS (takes for 2 days every 2-3 months due to IBS flare)  Promethazine 25 mg - PRN nausea (about 1-2x/month)  Vitamin D3 5000 IU - taking once daily    [place] [employer] 50+ - taking once daily  Magnesium (dose unknown) - taking once daily  Patient Goals/Self-Care Activities . Over the next 30 days, patient will:  - patient will come into office for [facility] sample, lab appointment, and follow up with primary care NP      Follow Up Plan:  Telephone Visit with CCM pharmacist in 30 days (01/08/21 - 11:30 AM)  Medication Assistance: None required.  Patient affirms current coverage meets needs.  Patient's preferred pharmacy is:  [place], [address] [place] [address] Phone: [phone] Fax: [phone]  [id number] [place] [id number]: [phone]: [phone] [place] [place] [facility] [facility]. Denies any adherence concerns.  Care Plan and Follow Up Patient Decision:  Patient agrees to Care Plan and Follow-up.  MDebbora Dus PharmD Clinical Pharmacist [phone]

## 2021-01-08 NOTE — Telephone Encounter (Signed)
Patient is scheduled for 4/13 at 10:30.

## 2021-01-13 ENCOUNTER — Ambulatory Visit: Payer: Medicare HMO

## 2021-01-20 ENCOUNTER — Other Ambulatory Visit: Payer: Self-pay

## 2021-01-20 ENCOUNTER — Other Ambulatory Visit: Payer: Self-pay | Admitting: Primary Care

## 2021-01-20 ENCOUNTER — Ambulatory Visit: Payer: Medicare HMO

## 2021-01-20 DIAGNOSIS — F411 Generalized anxiety disorder: Secondary | ICD-10-CM

## 2021-01-20 NOTE — Telephone Encounter (Signed)
Patient was told by [place] to come in to the office and request Venlafaxine XR 37.5 mg.  They told her it may get filled quicker.  Patient told Anda Kraft at her office visit she had the medication, but she didn't have any.

## 2021-01-21 DIAGNOSIS — M5412 Radiculopathy, cervical region: Secondary | ICD-10-CM | POA: Diagnosis not present

## 2021-01-21 DIAGNOSIS — M5416 Radiculopathy, lumbar region: Secondary | ICD-10-CM | POA: Diagnosis not present

## 2021-01-21 DIAGNOSIS — M545 Low back pain, unspecified: Secondary | ICD-10-CM | POA: Diagnosis not present

## 2021-01-21 DIAGNOSIS — M5382 Other specified dorsopathies, cervical region: Secondary | ICD-10-CM | POA: Diagnosis not present

## 2021-01-21 NOTE — Telephone Encounter (Signed)
Noted, refill provided

## 2021-01-22 NOTE — Telephone Encounter (Signed)
Chronic Care Management Pharmacy Note  01/22/2021 Name:  Danielle Edwards MRN:  [id number] DOB:  04-Oct-1955  Subjective: Danielle Edwards is an 65 y.o. year old female who is a primary patient of Pleas Koch, NP.  The CCM team was consulted for assistance with disease management and care coordination needs.    Attempted to reach patient by telephone for follow up visit in response to provider referral for pharmacy case management and/or care coordination services. Unable to reach patient by phone, left VM. Chart reviewed prior to visit.  CCM consent 10/28/20 Initial CCM visit 12/08/20  Consent to Services:  The patient was given information about Chronic Care Management services, agreed to services, and gave verbal consent prior to initiation of services.  Please see initial visit note for detailed documentation.   Patient Care Team: Pleas Koch, NP as PCP - General (Nurse Practitioner) Debbora Dus, [facility] as Pharmacist (Pharmacist)  Recent office visits:  12/25/20 - PCP - Start Trulicity 1.77 mg weekly. Continue Lantus 35 units daily metformin XR 2000 mg daily. Dexcom sample provided. Check BG. RTC 3 months.   07/21/20 - PCP - DM f/u, She is checking her blood glucose 1-2 times most days. Fasting blood sugars have been: 90-140's. Afternoon noon readings before dinner: 150-160's. Continue Lantus 35 units and Metformin 1000 mg BID. Encouraged to continue to improve diet and exercise. Will follow up in 2 months & recheck A1c at that time  06/11/20 - Lab notes - Thyroid, cholesterol look okay. Diabetes is uncontrolled, worse than last visit. She must start checking her blood sugars at least twice daily. I would like to increase her Lantus insulin to 35 units daily. I would also like to see her back either virtually or in person for diabetes follow-up in 1 month.   06/10/20 - PCP - follow up of diabetes and Covid-19. Plan, Call the main office line to schedule a nurse visit for blood  pressure and weight check, and a lab appointment. Start checking your blood sugar levels.   Recent consult visits: None in past 6 months  Hospital visits: None in previous 6 months  Objective:  Lab Results  Component Value Date   CREATININE 1.26 (H) 12/25/2020   BUN 28 (H) 12/25/2020   GFR 44.74 (L) 12/25/2020   NA 138 12/25/2020   K 4.8 12/25/2020   CALCIUM 10.0 12/25/2020   CO2 27 12/25/2020   Lab Results  Component Value Date/Time   HGBA1C 9.8 (A) 12/25/2020 10:53 AM   HGBA1C 10.9 (H) 06/11/2020 03:14 PM   HGBA1C 9.9 (A) 12/20/2019 10:55 AM   HGBA1C 8.8 (H) 02/05/2018 10:12 AM   GFR 44.74 (L) 12/25/2020 11:25 AM   GFR 53.15 (L) 06/11/2020 03:14 PM   MICROALBUR 4.4 (H) 08/05/2016 11:14 AM   MICROALBUR 2.0 (H) 04/07/2015 09:15 AM    Last diabetic Eye exam: seems to be past due per chart review  Last diabetic Foot exam: seems to be past due per chart review  Lab Results  Component Value Date   CHOL 134 12/25/2020   HDL 49.00 12/25/2020   LDLCALC 65 12/25/2020   LDLDIRECT 112.0 02/05/2018   TRIG 96.0 12/25/2020   CHOLHDL 3 12/25/2020    Hepatic Function Latest Ref Rng & Units 06/11/2020 12/11/2018 01/23/2018  Total Protein 6.0 - 8.3 g/dL 7.5 7.9 7.5  Albumin 3.5 - 5.2 g/dL 4.0 4.2 3.8  AST 0 - 37 U/L 29 30 23   ALT 0 - 35 U/L 31 29 26  Alk Phosphatase 39 - 117 U/L 77 88 93  Total Bilirubin 0.2 - 1.2 mg/dL 0.4 0.2 0.2  Bilirubin, Direct 0.0 - 0.3 mg/dL - - -    Lab Results  Component Value Date/Time   TSH 2.29 06/11/2020 03:14 PM   TSH 3.05 09/17/2019 03:23 PM    CBC Latest Ref Rng & Units 12/20/2019 01/23/2018 06/29/2015  WBC 4.0 - 10.5 K/uL 7.3 10.3 7.9  Hemoglobin 12.0 - 15.0 g/dL 11.1(L) 9.7(L) 10.6(L)  Hematocrit 36.0 - 46.0 % 34.1(L) 31.4(L) 33.7(L)  Platelets 150.0 - 400.0 K/uL 253.0 350.0 302.0    Lab Results  Component Value Date/Time   VD25OH 54.23 12/25/2020 11:25 AM   VD25OH 37.22 10/30/2017 09:33 AM   Clinical ASCVD: No  The 10-year ASCVD  risk score Mikey Bussing DC Jr., et al., 2013) is: 10.6%   Values used to calculate the score:     Age: 70 years     Sex: Female     Is Non-Hispanic African American: No     Diabetic: Yes     Tobacco smoker: No     Systolic Blood Pressure: 093 mmHg     Is BP treated: Yes     HDL Cholesterol: 49 mg/dL     Total Cholesterol: 134 mg/dL    Depression screen [facility] 2/9 12/08/2020 09/12/2019  Decreased Interest 1 0  Down, Depressed, Hopeless 0 0  PHQ - 2 Score 1 0  Altered sleeping - 0  Tired, decreased energy - 0  Change in appetite - 0  Feeling bad or failure about yourself  - 0  Trouble concentrating - 0  Moving slowly or fidgety/restless - 0  Suicidal thoughts - 0  PHQ-9 Score - 0  Difficult doing work/chores - Not difficult at all     Social History   Tobacco Use  Smoking Status Never Smoker  Smokeless Tobacco Never Used   BP Readings from Last 3 Encounters:  12/25/20 120/78  07/21/20 118/62  07/17/20 117/67   Pulse Readings from Last 3 Encounters:  12/25/20 78  07/21/20 96  07/17/20 70   Wt Readings from Last 3 Encounters:  12/25/20 266 lb (120.7 kg)  07/21/20 258 lb (117 kg)  07/17/20 253 lb (114.8 kg)    Assessment/Interventions: Review of patient past medical history, allergies, medications, health status, including review of consultants reports, laboratory and other test data, was performed as part of comprehensive evaluation and provision of chronic care management services.   SDOH:  (Social Determinants of Health) assessments and interventions performed: No - Assessed at initial visit Financial Resource Strain: Low Risk   . Difficulty of Paying Living Expenses: Not very hard      CCM Care Plan  No Known Allergies  Medications Reviewed Today    Reviewed by Pleas Koch, NP (Nurse Practitioner) on 12/25/20 at 20  Med List Status: <None>  Medication Order Taking? Sig Documenting Provider Last Dose Status Informant  Accu-Chek FastClix Lancets MISC  [id number] Yes USE AS INSTRUCTED TO CHECK BLOOD SUGAR UP TO 3 TIMES DAILY Pleas Koch, NP Taking Active   amLODipine (NORVASC) 10 MG tablet [id number] Yes TAKE 1 TABLET BY MOUTH ONCE DAILY FOR BLOOD PRESSURE Pleas Koch, NP Taking Active   atorvastatin (LIPITOR) 40 MG tablet [id number] Yes TAKE ONE TABLET BY MOUTH AT BEDTIME FOR CHOLESTEROL Pleas Koch, NP Taking Active   blood glucose meter kit and supplies KIT [id number] Yes Dispense based on patient and insurance preference. Use up to four  times daily as directed. (FOR ICD-9 250.00, 250.01). Pleas Koch, NP Taking Active   Blood Glucose Monitoring Suppl (ACCU-CHEK AVIVA PLUS) w/Device KIT [id number] Yes Use as instructed to test blood sugar 3 times daily Pleas Koch, NP Taking Active   Cholecalciferol (VITAMIN D3) 125 MCG (5000 UT) CAPS [id number] Yes Take 5,000 Units by mouth daily. [provider] Taking Active   Dulaglutide (TRULICITY) 1.01 BP/1.0CH SOPN [id number] Yes Inject 0.75 mg into the skin once a week. For diabetes. Pleas Koch, NP  Active   glucose blood (ACCU-CHEK AVIVA) test strip [id number] Yes Use as instructed to test blood sugar up to 3 times daily Pleas Koch, NP Taking Active   Insulin Pen Needle 32G X 6 MM MISC [id number] Yes Use as instructed to inject insulin daily at bedtime Pleas Koch, NP Taking Active   LANTUS SOLOSTAR 100 UNIT/ML Solostar Pen [id number] Yes INJECT 35 UNITS INTO THE SKIN ONCE DAILY. Pleas Koch, NP Taking Active   loperamide (IMODIUM A-D) 2 MG tablet [id number] Yes Take 2 mg by mouth 4 (four) times daily as needed for diarrhea or loose stools. [provider] Taking Active   losartan (COZAAR) 100 MG tablet [id number] Yes TAKE 1 TABLET BY MOUTH ONCE DAILY FOR BLOOD PRESSURE Pleas Koch, NP Taking Active   Magnesium 200 MG TABS [id number] Yes Take by mouth daily. [provider] Taking Active Self  metFORMIN (GLUCOPHAGE XR)  500 MG 24 hr tablet [id number] Yes Take 4 tablets (2,000 mg total) by mouth daily with breakfast. For diabetes. Pleas Koch, NP Taking Active   Multiple Vitamins-Minerals (CENTRUM SILVER ULTRA WOMENS PO) [id number] Yes Take by mouth daily. [provider] Taking Active Self  pantoprazole (PROTONIX) 40 MG tablet [id number]   [provider]  Active   promethazine (PHENERGAN) 25 MG tablet [id number] Yes Take 25 mg by mouth every 6 (six) hours as needed. Reported on 09/21/2015 [provider] Taking Active            Med Note Cristela Felt, [facility]   Fri Aug 05, 2016 10:19 AM)    venlafaxine XR (EFFEXOR XR) 37.5 MG 24 hr capsule [id number] Yes Take 1 capsule (37.5 mg total) by mouth daily with breakfast. For anxiety and depression. Pleas Koch, NP Taking Active   XTAMPZA ER 13.5 [facility] C12A [id number]   [provider]  Active           Patient Active Problem List   Diagnosis Date Noted  . COVID-19 virus infection 06/02/2020  . Hypercalcemia 02/20/2020  . Localized edema 02/20/2020  . History of attention deficit disorder 10/18/2019  . Fatigue 09/17/2019  . CKD (chronic kidney disease) stage 3, GFR 30-59 ml/min (HCC) 12/11/2018  . Anemia 02/06/2018  . Vitamin D deficiency 08/05/2016  . Recurrent sinusitis 10/30/2015  . Encounter for annual general medical examination with abnormal findings in adult 04/13/2015  . Hyperlipidemia LDL goal <100 04/13/2015  . Esophageal reflux 03/23/2015  . Family history of colon cancer in mother 02/23/2015  . Family history of malignant neoplasm of digestive organs 02/23/2015  . Essential hypertension 02/10/2015  . DM type 2 (diabetes mellitus, type 2) ([place]) 02/10/2015  . Hypothyroidism 02/10/2015  . Chronic pain 02/10/2015  . Anxiety and depression 02/10/2015  . Hx of adenomatous polyp of colon 09/27/2000    Immunization History  Administered Date(s) Administered  . DTaP 12/01/2013  . Fluad Quad(high Dose 65+)  07/21/2020  . Influenza Inj Mdck Quad  Pf 11/03/2017  . Influenza,inj,Quad PF,6+ Mos 06/29/2015, 05/16/2018, 09/11/2019  . Influenza-Unspecified 09/11/2019  . Janssen (J&J) SARS-COV-2 Vaccination 12/11/2019  . PFIZER(Purple Top)SARS-COV-2 Vaccination 09/19/2020  . Pneumococcal Polysaccharide-23 04/13/2015  . Zoster Recombinat (Shingrix) 05/21/2018, 09/12/2018    Conditions to be addressed/monitored:  Hypertension, Hyperlipidemia, Diabetes, GERD, Chronic Kidney Disease, Hypothyroidism, Depression and Anxiety   Patient Care Plan: CCM Pharmacy Care Plan    Problem Identified: CHL AMB "PATIENT-SPECIFIC PROBLEM"     Long-Range Goal: Disease Management   Start Date: 12/08/2020  Priority: High  Note:     Current Barriers:  . Unable to achieve control of diabetes   . Not monitoring home BG due to finger pain   Pharmacist Clinical Goal(s):  Marland Kitchen Over the next 30 days, patient will achieve adherence to monitoring guidelines and medication adherence to achieve therapeutic efficacy through collaboration with PharmD and provider.   Interventions: . 1:1 collaboration with Pleas Koch, NP regarding development and update of comprehensive plan of care as evidenced by provider attestation and co-signature . Inter-disciplinary care team collaboration (see longitudinal plan of care) . Comprehensive medication review performed; medication list updated in electronic medical record  Hypertension (BP goal <140/90) -Controlled -Current treatment: . Losartan 100 mg - 1 tablet daily (morning - 4-5 AM) . Amlodipine 40 mg - 1 tablet daily (morning) -Medications previously tried: none reported -Current home readings: none reported, not routinely monitoring  -Denies hypotensive/hypertensive symptoms -Educated on BP goals and benefits of medications for prevention of heart attack, stroke and kidney damage; -Recommended to continue current medication  Hyperlipidemia: (LDL goal <  70) -Controlled -Current treatment: . Atorvastatin 40 mg - 1 tablet daily (morning) -Medications previously tried: none reported -Previously was not taking regularly due to concern for side effects, but taking it regularly now. -Educated on Cholesterol goals;  Benefits of statin for ASCVD risk reduction; -Recommended to continue current medication  Diabetes (A1c goal <7%) -Query controlled  -Current medications: . Lantus - 35 units daily  . Metformin 1000 mg - 1 tablet twice daily  -Medications previously tried: glimepiride, Jardiance (vaginal discomfort/yeast infections), Januvia (cost) -Current home glucose readings - no longer checking due to finger pain. Discussed proper use of lancing device. She is using lowest prick level to draw blood. She is rotating fingers. She was checking before breakfast and during day or bedtime for several months last fall.  . Fasting glucose: none  . Post prandial glucose: none  -Denies hypoglycemic/hyperglycemic symptoms -Current meal patterns: She does not eat until lunchtime usually (12-1 PM), skips BF. - Reports she lost 25 lbs when she had COVID due to poor appetite.  -Educated on:   Discussed mechanism of metformin and Lantus. Pt reports she occasionally takes an extra metformin due to having a third meal. Discussed avoiding taking more than 2000 mg/day.   Interested in CGM, fingers are hurting from finger sticks. Interested in trying a sample/CGM application. Prefers [employer]. She would like [place] to send rx to DME supplier for price check.  Insulin admin: She is concerned her insulin is ineffective. Reviewed proper admin. No concerns. She has occasional bruising. Rotating sites about 1 inch from belly button. Coming in at 90 degree angle.  Discussed timing of medications. She takes her metformin at different times each day due to changes in meal patterns. Discussed ER metformin as an option.  Counseled to check feet daily and get yearly  eye exams  Due for A1c, last PCP visit her SMBG were within goal. Would  like to re-eval A1c prior to med changes.  -Recommended to continue current medication; Try metformin ER to simplify schedule.  Coordinate labs for A1c update. Freestyle Libre sample.  Depression/Anxiety (Goal: Control symptoms) -Not ideally controlled -Current treatment: . Venlafaxine XR 37.5 mg - 1 capsule daily (not taking) -Medications previously tried/failed: Xanax - PRN anxiety -Pt is not taking currently. She reports she stopped it due to concerns about addiction/withdrawal. Discussed concerns and due to report of daily anxiety symptoms regarding IBS incontinence and nervous eating, recommended she resume daily therapy.  - Reports she used to be on Xanax PRN and took rarely for anxiety around dentist appts.  -Educated on Benefits of medication for symptom control -Recommended to continue current medication - Resume therapy.  GERD (Goal: Control symptoms) -Controlled  -Pt reports symptoms controlled -Current treatment  . Pantoprazole 20 mg - 1 tablet daily PRN triggers -Medications previously tried: none -Recommended to continue current medication  Other:  Oxycodone ER - PRN pain (takes 1-2x/week with bad weather/flares)  Loperamide - PRN loose stools/IBS (takes for 2 days every 2-3 months due to IBS flare)  Promethazine 25 mg - PRN nausea (about 1-2x/month)  Vitamin D3 5000 IU - taking once daily   [place] [employer] 50+ - taking once daily  Magnesium (dose unknown) - taking once daily  Patient Goals/Self-Care Activities . Over the next 30 days, patient will:  - patient will come into office for [facility] sample, lab appointment, and follow up with primary care NP     Follow Up Plan:   -CMA to call patient and review diabetes medications and BG log - Trulicity 4.60 mg weekly (new), metformin ER 500 mg 4 tablets in the morning, and Lantus 35 units daily -Please also ask if she has resumed  venlafaxine XR 37.5 mg - 1 capsule daily as recommended  -Reschedule CCM visit for May 2022  Medication Assistance: Unable to assess today  Patient's preferred pharmacy is:  [facility] [place] [address] Phone: [phone] Fax: [phone]  [id number] [address]: [phone]: [phone] [facility].  MDebbora Dus PharmD Clinical Pharmacist [phone]

## 2021-01-26 ENCOUNTER — Telehealth: Payer: Self-pay

## 2021-01-26 NOTE — Chronic Care Management (AMB) (Addendum)
Chronic Care Management Pharmacy Assistant   Name: Danielle Edwards  MRN: [id number] DOB: 10/20/1955  Reason for Encounter: Disease State - Diabetes and reschedule missed CCM appointment  Recent office visits:  12/25/20 - PCP - Start Trulicity 4.14 mg weekly. Continue Lantus 35 units daily metformin XR 2000 mg daily. Dexcom sample provided. Check BG. RTC 3 months.  07/21/20 - PCP - DM f/u, She is checking her blood glucose 1-2 times most days. Fasting blood sugars have been: 90-140's. Afternoon noon readings before dinner: 150-160's. Continue Lantus 35 units and Metformin 1000 mg BID. Encouraged to continue to improve diet and exercise. Will follow up in 2 months & recheck A1c at that time 06/11/20 - Lab notes - Thyroid, cholesterol look okay. Diabetes is uncontrolled, worse than last visit. She must start checking her blood sugars at least twice daily. I would like to increase her Lantus insulin to 35 units daily. I would also like to see her back either virtually or in person for diabetes follow-up in 1 month.  06/10/20 - PCP - follow up of diabetes and Covid-19. Plan, Call the main office line to schedule a nurse visit for blood pressure and weight check, and a lab appointment. Start checking your blood sugar levels.    Recent consult visits:  None in the last 6 months.  Hospital visits:  None in previous 6 months  Medications: Outpatient Encounter Medications as of 01/26/2021  Medication Sig   Accu-Chek FastClix Lancets MISC USE AS INSTRUCTED TO CHECK BLOOD SUGAR UP TO 3 TIMES DAILY   amLODipine (NORVASC) 10 MG tablet Take 1 tablet (10 mg total) by mouth daily. for blood pressure   atorvastatin (LIPITOR) 40 MG tablet TAKE ONE TABLET BY MOUTH AT BEDTIME FOR CHOLESTEROL   blood glucose meter kit and supplies KIT Dispense based on patient and insurance preference. Use up to four times daily as directed. (FOR ICD-9 250.00, 250.01).   blood glucose meter kit and supplies Dispense based on  patient and insurance preference. Use up to four times daily as directed. (FOR ICD-10 E10.9, E11.9).   Blood Glucose Monitoring Suppl (ACCU-CHEK AVIVA PLUS) w/Device KIT Use as instructed to test blood sugar 3 times daily   Cholecalciferol (VITAMIN D3) 125 MCG (5000 UT) CAPS Take 5,000 Units by mouth daily.   Dulaglutide (TRULICITY) 2.39 RV/2.0EB SOPN Inject 0.75 mg into the skin once a week. For diabetes.   glucose blood (ACCU-CHEK AVIVA) test strip Use as instructed to test blood sugar up to 3 times daily   Insulin Pen Needle 32G X 6 MM MISC Use as instructed to inject insulin daily at bedtime   LANTUS SOLOSTAR 100 UNIT/ML Solostar Pen INJECT 35 UNITS INTO THE SKIN ONCE DAILY.   loperamide (IMODIUM A-D) 2 MG tablet Take 2 mg by mouth 4 (four) times daily as needed for diarrhea or loose stools.   losartan (COZAAR) 100 MG tablet Take 1 tablet (100 mg total) by mouth daily. for blood pressure   Magnesium 200 MG TABS Take by mouth daily.   metFORMIN (GLUCOPHAGE XR) 500 MG 24 hr tablet Take 4 tablets (2,000 mg total) by mouth daily with breakfast. For diabetes.   Multiple Vitamins-Minerals (CENTRUM SILVER ULTRA WOMENS PO) Take by mouth daily.   pantoprazole (PROTONIX) 40 MG tablet    promethazine (PHENERGAN) 25 MG tablet Take 25 mg by mouth every 6 (six) hours as needed. Reported on 09/21/2015   venlafaxine XR (EFFEXOR-XR) 37.5 MG 24 hr capsule TAKE 1 CAPSULE BY  MOUTH DAILY WITH BREAKFAST. FOR ANXIETY AND DEPRESSION   XTAMPZA ER 13.5 MG C12A    No facility-administered encounter medications on file as of 01/26/2021.    Recent Relevant Labs: Lab Results  Component Value Date/Time   HGBA1C 9.8 (A) 12/25/2020 10:53 AM   HGBA1C 10.9 (H) 06/11/2020 03:14 PM   HGBA1C 9.9 (A) 12/20/2019 10:55 AM   HGBA1C 8.8 (H) 02/05/2018 10:12 AM   MICROALBUR 4.4 (H) 08/05/2016 11:14 AM   MICROALBUR 2.0 (H) 04/07/2015 09:15 AM    Kidney Function Lab Results  Component Value Date/Time   CREATININE 1.26 (H)  12/25/2020 11:25 AM   CREATININE 1.04 06/11/2020 03:14 PM   GFR 44.74 (L) 12/25/2020 11:25 AM   Diabetes  Current antihyperglycemic regimen:  Lantus - 35 units daily  Metformin 500 ER mg - 4 tablets once daily Trulicity 1.85 mg weekly (has not taken in 2 weeks due to cost)   Patient verbally confirms she is taking the above medications as directed. Yes  What recent interventions/DTPs have been made to improve glycemic control:  Recently added Trulicity to regimen. Switched to ER metformin. States the Trulicity was very expensive at last fill and could not pick it up. She last took it about 2 weeks ago. She is interested in patient application for Trulicity.   Have there been any recent hospitalizations or ED visits since last visit with CPP? No  Patient denies hypoglycemic symptoms, including Pale, Sweaty, Shaky, Hungry, Nervous/irritable and Vision changes  Patient denies hyperglycemic symptoms, including blurry vision, excessive thirst, fatigue, polyuria and weakness  How often are you checking your blood sugar? once daily  What are your blood sugars ranging?  Fasting: 150-160 Before meals: N/A After meals: N/A Bedtime: N/A  On insulin? Yes How many units: Lantus 35 units daily  During the week, how often does your blood glucose drop below 70? Never  Are you checking your feet daily/regularly? Yes  Adherence Review: Is the patient currently on a STATIN medication? Yes Is the patient currently on ACE/ARB medication? Yes Does the patient have >5 day gap between last estimated fill dates? Losartan past due   Star Rating Drugs:  Medication:  Last Fill: Day Supply Atorvastatin 40 mg 11/24/20 90 Losartan 100 mg 5/0/15  30  Application has been completed for Trulicity patient assistance. Patient is aware this will be mailed to her. Telephone appointment rescheduled for CCM appointment for 02/12/21 at 2:00 PM. Patient is aware to have any BP, BG readings and medications to  review.   Follow-Up:  Patient Assistance Coordination and Pharmacist Review  Debbora Dus, CPP notified  Margaretmary Dys, [place] Assistant [phone]  I have reviewed the care management and care coordination activities outlined in this encounter and I am certifying that I agree with the content of this note. No further action required.  Debbora Dus, PharmD Clinical Pharmacist [facility] [phone]

## 2021-01-28 NOTE — Progress Notes (Signed)
PAP for Trulicity mailed to the patient.  Debbora Dus, CPP notified  [name], [place] Assistant [phone]

## 2021-02-12 ENCOUNTER — Telehealth: Payer: Medicare HMO

## 2021-02-12 NOTE — Progress Notes (Deleted)
Chronic Care Management Pharmacy Note  02/12/2021 Name:  Danielle Edwards MRN:  [id number] DOB:  [birth year]  Subjective: Danielle Edwards is an 65 y.o. year old female who is a primary patient of Pleas Koch, NP.  The CCM team was consulted for assistance with disease management and care coordination needs.    Engaged with patient by telephone for follow up visit in response to provider referral for pharmacy case management and/or care coordination services.  CCM consent 10/28/20 Initial CCM visit 12/08/20  Consent to Services:  The patient was given information about Chronic Care Management services, agreed to services, and gave verbal consent prior to initiation of services.  Please see initial visit note for detailed documentation.   Patient Care Team: Pleas Koch, NP as PCP - General (Nurse Practitioner) Debbora Dus, [facility] as Pharmacist (Pharmacist)  Recent office visits:  01/26/21 - CCM - Mailed Trulicity PAP application   4/62/86 - PCP - Start Trulicity 3.81 mg weekly. Continue Lantus 35 units daily metformin XR 2000 mg daily. Dexcom sample provided. Check BG. RTC 3 months.   07/21/20 - PCP - DM f/u, She is checking her blood glucose 1-2 times most days. Fasting blood sugars have been: 90-140's. Afternoon noon readings before dinner: 150-160's. Continue Lantus 35 units and Metformin 1000 mg BID. Encouraged to continue to improve diet and exercise. Will follow up in 2 months & recheck A1c at that time  06/11/20 - Lab notes - Thyroid, cholesterol look okay. Diabetes is uncontrolled, worse than last visit. She must start checking her blood sugars at least twice daily. I would like to increase her Lantus insulin to 35 units daily. I would also like to see her back either virtually or in person for diabetes follow-up in 1 month.   06/10/20 - PCP - follow up of diabetes and Covid-19. Plan, Call the main office line to schedule a nurse visit for blood pressure and weight check, and  a lab appointment. Start checking your blood sugar levels.   Recent consult visits: None in past 6 months  Hospital visits: None in previous 6 months  Objective:  Lab Results  Component Value Date   CREATININE 1.26 (H) 12/25/2020   BUN 28 (H) 12/25/2020   GFR 44.74 (L) 12/25/2020   NA 138 12/25/2020   K 4.8 12/25/2020   CALCIUM 10.0 12/25/2020   CO2 27 12/25/2020   Lab Results  Component Value Date/Time   HGBA1C 9.8 (A) 12/25/2020 10:53 AM   HGBA1C 10.9 (H) 06/11/2020 03:14 PM   HGBA1C 9.9 (A) 12/20/2019 10:55 AM   HGBA1C 8.8 (H) 02/05/2018 10:12 AM   GFR 44.74 (L) 12/25/2020 11:25 AM   GFR 53.15 (L) 06/11/2020 03:14 PM   MICROALBUR 4.4 (H) 08/05/2016 11:14 AM   MICROALBUR 2.0 (H) 04/07/2015 09:15 AM    Last diabetic Eye exam: seems to be past due per chart review  Last diabetic Foot exam: seems to be past due per chart review  Lab Results  Component Value Date   CHOL 134 12/25/2020   HDL 49.00 12/25/2020   LDLCALC 65 12/25/2020   LDLDIRECT 112.0 02/05/2018   TRIG 96.0 12/25/2020   CHOLHDL 3 12/25/2020    Hepatic Function Latest Ref Rng & Units 06/11/2020 12/11/2018 01/23/2018  Total Protein 6.0 - 8.3 g/dL 7.5 7.9 7.5  Albumin 3.5 - 5.2 g/dL 4.0 4.2 3.8  AST 0 - 37 U/L 29 30 23   ALT 0 - 35 U/L 31 29 26   Alk Phosphatase 39 -  117 U/L 77 88 93  Total Bilirubin 0.2 - 1.2 mg/dL 0.4 0.2 0.2  Bilirubin, Direct 0.0 - 0.3 mg/dL - - -    Lab Results  Component Value Date/Time   TSH 2.29 06/11/2020 03:14 PM   TSH 3.05 09/17/2019 03:23 PM    CBC Latest Ref Rng & Units 12/20/2019 01/23/2018 06/29/2015  WBC 4.0 - 10.5 K/uL 7.3 10.3 7.9  Hemoglobin 12.0 - 15.0 g/dL 11.1(L) 9.7(L) 10.6(L)  Hematocrit 36.0 - 46.0 % 34.1(L) 31.4(L) 33.7(L)  Platelets 150.0 - 400.0 K/uL 253.0 350.0 302.0    Lab Results  Component Value Date/Time   VD25OH 54.23 12/25/2020 11:25 AM   VD25OH 37.22 10/30/2017 09:33 AM   Clinical ASCVD: No  The 10-year ASCVD risk score Mikey Bussing DC Jr., et al.,  2013) is: 10.6%   Values used to calculate the score:     Age: 1 years     Sex: Female     Is Non-Hispanic African American: No     Diabetic: Yes     Tobacco smoker: No     Systolic Blood Pressure: 340 mmHg     Is BP treated: Yes     HDL Cholesterol: 49 mg/dL     Total Cholesterol: 134 mg/dL    Depression screen [facility] 2/9 12/08/2020 09/12/2019  Decreased Interest 1 0  Down, Depressed, Hopeless 0 0  PHQ - 2 Score 1 0  Altered sleeping - 0  Tired, decreased energy - 0  Change in appetite - 0  Feeling bad or failure about yourself  - 0  Trouble concentrating - 0  Moving slowly or fidgety/restless - 0  Suicidal thoughts - 0  PHQ-9 Score - 0  Difficult doing work/chores - Not difficult at all     Social History   Tobacco Use  Smoking Status Never Smoker  Smokeless Tobacco Never Used   BP Readings from Last 3 Encounters:  12/25/20 120/78  07/21/20 118/62  07/17/20 117/67   Pulse Readings from Last 3 Encounters:  12/25/20 78  07/21/20 96  07/17/20 70   Wt Readings from Last 3 Encounters:  12/25/20 266 lb (120.7 kg)  07/21/20 258 lb (117 kg)  07/17/20 253 lb (114.8 kg)    Assessment/Interventions: Review of patient past medical history, allergies, medications, health status, including review of consultants reports, laboratory and other test data, was performed as part of comprehensive evaluation and provision of chronic care management services.   SDOH:  (Social Determinants of Health) assessments and interventions performed: Yes   CCM Care Plan  No Known Allergies  Medications Reviewed Today    Reviewed by Pleas Koch, NP (Nurse Practitioner) on 12/25/20 at 1118  Med List Status: <None>  Medication Order Taking? Sig Documenting Provider Last Dose Status Informant  Accu-Chek FastClix Lancets MISC [id number] Yes USE AS INSTRUCTED TO CHECK BLOOD SUGAR UP TO 3 TIMES DAILY Pleas Koch, NP Taking Active   amLODipine (NORVASC) 10 MG tablet [id number] Yes TAKE  1 TABLET BY MOUTH ONCE DAILY FOR BLOOD PRESSURE Pleas Koch, NP Taking Active   atorvastatin (LIPITOR) 40 MG tablet [id number] Yes TAKE ONE TABLET BY MOUTH AT BEDTIME FOR CHOLESTEROL Pleas Koch, NP Taking Active   blood glucose meter kit and supplies KIT [id number] Yes Dispense based on patient and insurance preference. Use up to four times daily as directed. (FOR ICD-9 250.00, 250.01). Pleas Koch, NP Taking Active   Blood Glucose Monitoring Suppl (ACCU-CHEK AVIVA PLUS) w/Device KIT [id number] Yes Use  as instructed to test blood sugar 3 times daily Pleas Koch, NP Taking Active   Cholecalciferol (VITAMIN D3) 125 MCG (5000 UT) CAPS [id number] Yes Take 5,000 Units by mouth daily. [provider] Taking Active   Dulaglutide (TRULICITY) 2.72 ZD/6.6YQ SOPN [id number] Yes Inject 0.75 mg into the skin once a week. For diabetes. Pleas Koch, NP  Active   glucose blood (ACCU-CHEK AVIVA) test strip [id number] Yes Use as instructed to test blood sugar up to 3 times daily Pleas Koch, NP Taking Active   Insulin Pen Needle 32G X 6 MM MISC [id number] Yes Use as instructed to inject insulin daily at bedtime Pleas Koch, NP Taking Active   LANTUS SOLOSTAR 100 UNIT/ML Solostar Pen [id number] Yes INJECT 35 UNITS INTO THE SKIN ONCE DAILY. Pleas Koch, NP Taking Active   loperamide (IMODIUM A-D) 2 MG tablet [id number] Yes Take 2 mg by mouth 4 (four) times daily as needed for diarrhea or loose stools. [provider] Taking Active   losartan (COZAAR) 100 MG tablet [id number] Yes TAKE 1 TABLET BY MOUTH ONCE DAILY FOR BLOOD PRESSURE Pleas Koch, NP Taking Active   Magnesium 200 MG TABS [id number] Yes Take by mouth daily. [provider] Taking Active Self  metFORMIN (GLUCOPHAGE XR) 500 MG 24 hr tablet [id number] Yes Take 4 tablets (2,000 mg total) by mouth daily with breakfast. For diabetes. Pleas Koch, NP Taking Active   Multiple  Vitamins-Minerals (CENTRUM SILVER ULTRA WOMENS PO) [id number] Yes Take by mouth daily. [provider] Taking Active Self  pantoprazole (PROTONIX) 40 MG tablet [id number]   [provider]  Active   promethazine (PHENERGAN) 25 MG tablet [id number] Yes Take 25 mg by mouth every 6 (six) hours as needed. Reported on 09/21/2015 [provider] Taking Active            Med Note Cristela Felt, [facility]   Fri Aug 05, 2016 10:19 AM)    venlafaxine XR (EFFEXOR XR) 37.5 MG 24 hr capsule [id number] Yes Take 1 capsule (37.5 mg total) by mouth daily with breakfast. For anxiety and depression. Pleas Koch, NP Taking Active   XTAMPZA ER 13.5 [facility] C12A [id number]   [provider]  Active           Patient Active Problem List   Diagnosis Date Noted  . COVID-19 virus infection 06/02/2020  . Hypercalcemia 02/20/2020  . Localized edema 02/20/2020  . History of attention deficit disorder 10/18/2019  . Fatigue 09/17/2019  . CKD (chronic kidney disease) stage 3, GFR 30-59 ml/min (HCC) 12/11/2018  . Anemia 02/06/2018  . Vitamin D deficiency 08/05/2016  . Recurrent sinusitis 10/30/2015  . Encounter for annual general medical examination with abnormal findings in adult 04/13/2015  . Hyperlipidemia LDL goal <100 04/13/2015  . Esophageal reflux 03/23/2015  . Family history of colon cancer in mother 02/23/2015  . Family history of malignant neoplasm of digestive organs 02/23/2015  . Essential hypertension 02/10/2015  . DM type 2 (diabetes mellitus, type 2) ([place]) 02/10/2015  . Hypothyroidism 02/10/2015  . Chronic pain 02/10/2015  . Anxiety and depression 02/10/2015  . Hx of adenomatous polyp of colon 09/27/2000    Immunization History  Administered Date(s) Administered  . DTaP 12/01/2013  . Fluad Quad(high Dose 65+) 07/21/2020  . Influenza Inj Mdck Quad Pf 11/03/2017  . Influenza,inj,Quad PF,6+ Mos 06/29/2015, 05/16/2018, 09/11/2019  . Influenza-Unspecified 09/11/2019   . Janssen (J&J) SARS-COV-2 Vaccination 12/11/2019  . PFIZER(Purple Top)SARS-COV-2 Vaccination 09/19/2020  .  Pneumococcal Polysaccharide-23 04/13/2015  . Zoster Recombinat (Shingrix) 05/21/2018, 09/12/2018    Conditions to be addressed/monitored:  Hypertension, Hyperlipidemia, Diabetes, GERD, Chronic Kidney Disease, Hypothyroidism, Depression and Anxiety  Current Barriers:  . Unable to achieve control of diabetes   . Not monitoring home BG due to finger pain  .  Pharmacist Clinical Goal(s):  Marland Kitchen Over the next 30 days, patient will achieve adherence to monitoring guidelines and medication adherence to achieve therapeutic efficacy through collaboration with PharmD and provider.   Interventions: . 1:1 collaboration with Pleas Koch, NP regarding development and update of comprehensive plan of care as evidenced by provider attestation and co-signature . Inter-disciplinary care team collaboration (see longitudinal plan of care) . Comprehensive medication review performed; medication list updated in electronic medical record  Hypertension (BP goal <140/90) -Controlled -Current treatment: . Losartan 100 mg - 1 tablet daily (morning - 4-5 AM) . Amlodipine 40 mg - 1 tablet daily (morning) -Medications previously tried: none reported -Current home readings: none reported, not routinely monitoring  -Denies hypotensive/hypertensive symptoms -Educated on BP goals and benefits of medications for prevention of heart attack, stroke and kidney damage; -Recommended to continue current medication  Hyperlipidemia: (LDL goal < 70) -Controlled -Current treatment: . Atorvastatin 40 mg - 1 tablet daily (morning) -Medications previously tried: none reported -Previously was not taking regularly due to concern for side effects, but taking it regularly now. -Educated on Cholesterol goals;  Benefits of statin for ASCVD risk reduction; -Recommended to continue current medication  Diabetes (A1c goal  <7%) -Query controlled  -Current medications: . Lantus - 35 units daily  . Metformin ER 500 mg - 4 tablets in morning . Trulicity 2.35 mg - Inject weekly  -Medications previously tried: glimepiride, Jardiance (vaginal discomfort/yeast infections), Januvia (cost) -Current home glucose readings - no longer checking due to finger pain. Discussed proper use of lancing device. She is using lowest prick level to draw blood. She is rotating fingers. She was checking before breakfast and during day or bedtime for several months last fall.  . Fasting glucose: none  . Post prandial glucose: none  -Denies hypoglycemic/hyperglycemic symptoms -Current meal patterns: She does not eat until lunchtime usually (12-1 PM), skips BF. - Reports she lost 25 lbs when she had COVID due to poor appetite.  -Educated on:   Discussed mechanism of metformin and Lantus. Pt reports she occasionally takes an extra metformin due to having a third meal. Discussed avoiding taking more than 2000 mg/day.   Interested in CGM, fingers are hurting from finger sticks. Interested in trying a sample/CGM application. Prefers [employer]. She would like [place] to send rx to DME supplier for price check.  Insulin admin: She is concerned her insulin is ineffective. Reviewed proper admin. No concerns. She has occasional bruising. Rotating sites about 1 inch from belly button. Coming in at 90 degree angle.  Discussed timing of medications. She takes her metformin at different times each day due to changes in meal patterns. Discussed ER metformin as an option.  Counseled to check feet daily and get yearly eye exams  Due for A1c, last PCP visit her SMBG were within goal. Would like to re-eval A1c prior to med changes.  -Recommended to continue current medication; Try metformin ER to simplify schedule.  Coordinate labs for A1c update. Freestyle Libre sample.  Depression/Anxiety (Goal: Control symptoms) -Not ideally controlled -Current  treatment: . Venlafaxine XR 37.5 mg - 1 capsule daily (not taking) -Medications previously tried/failed: Xanax - PRN anxiety -Pt is  not taking currently. She reports she stopped it due to concerns about addiction/withdrawal. Discussed concerns and due to report of daily anxiety symptoms regarding IBS incontinence and nervous eating, recommended she resume daily therapy.  - Reports she used to be on Xanax PRN and took rarely for anxiety around dentist appts.  -Educated on Benefits of medication for symptom control -Recommended to continue current medication - Resume therapy.  GERD (Goal: Control symptoms) -Controlled  -Pt reports symptoms controlled -Current treatment  . Pantoprazole 20 mg - 1 tablet daily PRN triggers -Medications previously tried: none -Recommended to continue current medication  Other:  Oxycodone ER - PRN pain (takes 1-2x/week with bad weather/flares)  Loperamide - PRN loose stools/IBS (takes for 2 days every 2-3 months due to IBS flare)  Promethazine 25 mg - PRN nausea (about 1-2x/month)  Vitamin D3 5000 IU - taking once daily   [place] [employer] 50+ - taking once daily  Magnesium (dose unknown) - taking once daily  Patient Goals/Self-Care Activities . Over the next 30 days, patient will:  - patient will come into office for [facility] sample, lab appointment, and follow up with primary care NP  Follow Up Plan:   -CMA to call patient and review diabetes medications and BG log - Trulicity 7.40 mg weekly (new), metformin ER 500 mg 4 tablets in the morning, and Lantus 35 units daily -Please also ask if she has resumed venlafaxine XR 37.5 mg - 1 capsule daily as recommended  -Reschedule CCM visit for May 2022  Medication Assistance:   Patient's preferred pharmacy is:  [place], [address] [place] [address] Phone: [phone] Fax: [phone]  [id number] [address] [place]: [phone]: [phone] [place] [facility].  MDebbora Dus PharmD Clinical Pharmacist [facility]Primary [facility] at [phone]

## 2021-02-23 ENCOUNTER — Other Ambulatory Visit: Payer: Self-pay

## 2021-02-23 ENCOUNTER — Ambulatory Visit (INDEPENDENT_AMBULATORY_CARE_PROVIDER_SITE_OTHER): Payer: Medicare HMO

## 2021-02-23 DIAGNOSIS — Z0000 Encounter for general adult medical examination without abnormal findings: Secondary | ICD-10-CM | POA: Diagnosis not present

## 2021-02-23 NOTE — Patient Instructions (Signed)
Danielle Edwards , Thank you for taking time to come for your Medicare Wellness Visit. I appreciate your ongoing commitment to your health goals. Please review the following plan we discussed and let me know if I can assist you in the future.   Screening recommendations/referrals: Colonoscopy: Up to date, completed 1015/2021, due 07/2023 Mammogram: due, Patient states she will schedule appointment soon.  Bone Density: due, Patient states she will schedule appointment soon.  Recommended yearly ophthalmology/optometry visit for glaucoma screening and checkup Recommended yearly dental visit for hygiene and checkup  Vaccinations: Influenza vaccine: Up to date, completed 07/21/2020, due 05/2021 Pneumococcal vaccine: due, will get at pharmacy. Please bring documentation to physical.  Tdap vaccine: decline-insurance  Shingles vaccine: Completed series   Covid-19:Completed series  Advanced directives: Advance directive discussed with you today. Even though you declined this today please call our office should you change your mind and we can give you the proper paperwork for you to fill out.  Conditions/risks identified: diabetes, hypertension, hyperlipidemia   Next appointment: Follow up in one year for your annual wellness visit    Preventive Care 13 Years and Older, Female Preventive care refers to lifestyle choices and visits with your health care provider that can promote health and wellness. What does preventive care include?  A yearly physical exam. This is also called an annual well check.  Dental exams once or twice a year.  Routine eye exams. Ask your health care provider how often you should have your eyes checked.  Personal lifestyle choices, including:  Daily care of your teeth and gums.  Regular physical activity.  Eating a healthy diet.  Avoiding tobacco and drug use.  Limiting alcohol use.  Practicing safe sex.  Taking low-dose aspirin every day.  Taking vitamin and  mineral supplements as recommended by your health care provider. What happens during an annual well check? The services and screenings done by your health care provider during your annual well check will depend on your age, overall health, lifestyle risk factors, and family history of disease. Counseling  Your health care provider may ask you questions about your:  Alcohol use.  Tobacco use.  Drug use.  Emotional well-being.  Home and relationship well-being.  Sexual activity.  Eating habits.  History of falls.  Memory and ability to understand (cognition).  Work and work Statistician.  Reproductive health. Screening  You may have the following tests or measurements:  Height, weight, and BMI.  Blood pressure.  Lipid and cholesterol levels. These may be checked every 5 years, or more frequently if you are over 4 years old.  Skin check.  Lung cancer screening. You may have this screening every year starting at age 51 if you have a 30-pack-year history of smoking and currently smoke or have quit within the past 15 years.  Fecal occult blood test (FOBT) of the stool. You may have this test every year starting at age 12.  Flexible sigmoidoscopy or colonoscopy. You may have a sigmoidoscopy every 5 years or a colonoscopy every 10 years starting at age 80.  Hepatitis C blood test.  Hepatitis B blood test.  Sexually transmitted disease (STD) testing.  Diabetes screening. This is done by checking your blood sugar (glucose) after you have not eaten for a while (fasting). You may have this done every 1-3 years.  Bone density scan. This is done to screen for osteoporosis. You may have this done starting at age 82.  Mammogram. This may be done every 1-2 years. Talk  to your health care provider about how often you should have regular mammograms. Talk with your health care provider about your test results, treatment options, and if necessary, the need for more tests. Vaccines   Your health care provider may recommend certain vaccines, such as:  Influenza vaccine. This is recommended every year.  Tetanus, diphtheria, and acellular pertussis (Tdap, Td) vaccine. You may need a Td booster every 10 years.  Zoster vaccine. You may need this after age 69.  Pneumococcal 13-valent conjugate (PCV13) vaccine. One dose is recommended after age 1.  Pneumococcal polysaccharide (PPSV23) vaccine. One dose is recommended after age 74. Talk to your health care provider about which screenings and vaccines you need and how often you need them. This information is not intended to replace advice given to you by your health care provider. Make sure you discuss any questions you have with your health care provider. Document Released: 10/16/2015 Document Revised: 06/08/2016 Document Reviewed: 07/21/2015 Elsevier Interactive Patient Education  2017 [place] Prevention in the Home Falls can cause injuries. They can happen to people of all ages. There are many things you can do to make your home safe and to help prevent falls. What can I do on the outside of my home?  Regularly fix the edges of walkways and driveways and fix any cracks.  Remove anything that might make you trip as you walk through a door, such as a raised step or threshold.  Trim any bushes or trees on the path to your home.  Use bright outdoor lighting.  Clear any walking paths of anything that might make someone trip, such as rocks or tools.  Regularly check to see if handrails are loose or broken. Make sure that both sides of any steps have handrails.  Any raised decks and porches should have guardrails on the edges.  Have any leaves, snow, or ice cleared regularly.  Use sand or salt on walking paths during winter.  Clean up any spills in your garage right away. This includes oil or grease spills. What can I do in the bathroom?  Use night lights.  Install grab bars by the toilet and in the  tub and shower. Do not use towel bars as grab bars.  Use non-skid mats or decals in the tub or shower.  If you need to sit down in the shower, use a plastic, non-slip stool.  Keep the floor dry. Clean up any water that spills on the floor as soon as it happens.  Remove soap buildup in the tub or shower regularly.  Attach bath mats securely with double-sided non-slip rug tape.  Do not have throw rugs and other things on the floor that can make you trip. What can I do in the bedroom?  Use night lights.  Make sure that you have a light by your bed that is easy to reach.  Do not use any sheets or blankets that are too big for your bed. They should not hang down onto the floor.  Have a firm chair that has side arms. You can use this for support while you get dressed.  Do not have throw rugs and other things on the floor that can make you trip. What can I do in the kitchen?  Clean up any spills right away.  Avoid walking on wet floors.  Keep items that you use a lot in easy-to-reach places.  If you need to reach something above you, use a strong step stool that  has a grab bar.  Keep electrical cords out of the way.  Do not use floor polish or wax that makes floors slippery. If you must use wax, use non-skid floor wax.  Do not have throw rugs and other things on the floor that can make you trip. What can I do with my stairs?  Do not leave any items on the stairs.  Make sure that there are handrails on both sides of the stairs and use them. Fix handrails that are broken or loose. Make sure that handrails are as long as the stairways.  Check any carpeting to make sure that it is firmly attached to the stairs. Fix any carpet that is loose or worn.  Avoid having throw rugs at the top or bottom of the stairs. If you do have throw rugs, attach them to the floor with carpet tape.  Make sure that you have a light switch at the top of the stairs and the bottom of the stairs. If you  do not have them, ask someone to add them for you. What else can I do to help prevent falls?  Wear shoes that:  Do not have high heels.  Have rubber bottoms.  Are comfortable and fit you well.  Are closed at the toe. Do not wear sandals.  If you use a stepladder:  Make sure that it is fully opened. Do not climb a closed stepladder.  Make sure that both sides of the stepladder are locked into place.  Ask someone to hold it for you, if possible.  Clearly mark and make sure that you can see:  Any grab bars or handrails.  First and last steps.  Where the edge of each step is.  Use tools that help you move around (mobility aids) if they are needed. These include:  Canes.  Walkers.  Scooters.  Crutches.  Turn on the lights when you go into a dark area. Replace any light bulbs as soon as they burn out.  Set up your furniture so you have a clear path. Avoid moving your furniture around.  If any of your floors are uneven, fix them.  If there are any pets around you, be aware of where they are.  Review your medicines with your doctor. Some medicines can make you feel dizzy. This can increase your chance of falling. Ask your doctor what other things that you can do to help prevent falls. This information is not intended to replace advice given to you by your health care provider. Make sure you discuss any questions you have with your health care provider. Document Released: 07/16/2009 Document Revised: 02/25/2016 Document Reviewed: 10/24/2014 Elsevier Interactive Patient Education  2017 [employer].

## 2021-02-23 NOTE — Progress Notes (Signed)
Subjective:   Danielle Edwards is a 65 y.o. female who presents for Medicare Annual (Subsequent) preventive examination.  Review of Systems: N/A      I connected with the patient today by telephone and verified that I am speaking with the correct person using two identifiers. Location patient: home Location nurse: work Persons participating in the telephone visit: patient, nurse.   I discussed the limitations, risks, security and privacy concerns of performing an evaluation and management service by telephone and the availability of in person appointments. I also discussed with the patient that there may be a patient responsible charge related to this service. The patient expressed understanding and verbally consented to this telephonic visit.        Cardiac Risk Factors include: advanced age (>34mn, >>56women);diabetes mellitus;hypertension;Other (see comment), Risk factor comments: hyperlipidemia     Objective:    Today's Vitals   There is no height or weight on file to calculate BMI.  Advanced Directives 02/23/2021 07/17/2020 09/12/2019 08/17/2015  Does Patient Have a Medical Advance Directive? No No No No  Does patient want to make changes to medical advance directive? No - Patient declined - - -  Would patient like information on creating a medical advance directive? - No - Patient declined No - Patient declined -    Current Medications (verified) Outpatient Encounter Medications as of 02/23/2021  Medication Sig  . Accu-Chek FastClix Lancets MISC USE AS INSTRUCTED TO CHECK BLOOD SUGAR UP TO 3 TIMES DAILY  . amLODipine (NORVASC) 10 MG tablet Take 1 tablet (10 mg total) by mouth daily. for blood pressure  . atorvastatin (LIPITOR) 40 MG tablet TAKE ONE TABLET BY MOUTH AT BEDTIME FOR CHOLESTEROL  . blood glucose meter kit and supplies KIT Dispense based on patient and insurance preference. Use up to four times daily as directed. (FOR ICD-9 250.00, 250.01).  . blood glucose  meter kit and supplies Dispense based on patient and insurance preference. Use up to four times daily as directed. (FOR ICD-10 E10.9, E11.9).  .Marland KitchenBlood Glucose Monitoring Suppl (ACCU-CHEK AVIVA PLUS) w/Device KIT Use as instructed to test blood sugar 3 times daily  . Cholecalciferol (VITAMIN D3) 125 MCG (5000 UT) CAPS Take 5,000 Units by mouth daily.  .Marland Kitchenglucose blood (ACCU-CHEK AVIVA) test strip Use as instructed to test blood sugar up to 3 times daily  . Insulin Pen Needle 32G X 6 MM MISC Use as instructed to inject insulin daily at bedtime  . LANTUS SOLOSTAR 100 UNIT/ML Solostar Pen INJECT 35 UNITS INTO THE SKIN ONCE DAILY.  .Marland Kitchenloperamide (IMODIUM A-D) 2 MG tablet Take 2 mg by mouth 4 (four) times daily as needed for diarrhea or loose stools.  .Marland Kitchenlosartan (COZAAR) 100 MG tablet Take 1 tablet (100 mg total) by mouth daily. for blood pressure  . Magnesium 200 MG TABS Take by mouth daily.  . metFORMIN (GLUCOPHAGE XR) 500 MG 24 hr tablet Take 4 tablets (2,000 mg total) by mouth daily with breakfast. For diabetes.  . Multiple Vitamins-Minerals (CENTRUM SILVER ULTRA WOMENS PO) Take by mouth daily.  . pantoprazole (PROTONIX) 40 MG tablet   . promethazine (PHENERGAN) 25 MG tablet Take 25 mg by mouth every 6 (six) hours as needed. Reported on 09/21/2015  . venlafaxine XR (EFFEXOR-XR) 37.5 MG 24 hr capsule TAKE 1 CAPSULE BY MOUTH DAILY WITH BREAKFAST. FOR ANXIETY AND DEPRESSION  . XTAMPZA ER 13.5 MG C12A   . Dulaglutide (TRULICITY) 02.26MJF/3.5KTSOPN Inject 0.75 mg into the  skin once a week. For diabetes. (Patient not taking: Reported on 02/23/2021)   No facility-administered encounter medications on file as of 02/23/2021.    Allergies (verified) Patient has no known allergies.   History: Past Medical History:  Diagnosis Date  . Allergy    seasonal allergies  . Anemia    hx of IDA  . Anxiety    on meds  . Arthritis    PRN meds-osteoarthritis  . Cataract    sx to remove  . Chronic kidney  disease    CKD-stage 3  . Chronic pain   . Depression    on meds  . Diabetes type 2, controlled ([place])    on meds  . Fibromyalgia   . GERD (gastroesophageal reflux disease)    on meds  . Headache   . History of colonic polyps 1999   Benign  . Hx of adenomatous polyp of colon 09/27/2000   2001 - diminutive adenoma [place] 2007 no polyps - [place]  . Hyperlipidemia    on meds  . Hypertension    on meds  . Hypothyroidism    not on meds at this time  . IBS (irritable bowel syndrome)    Diarrhea type  . Neuromuscular disorder ([place])   . Vitamin D deficiency    Past Surgical History:  Procedure Laterality Date  . CHOLECYSTECTOMY  1995  . COLONOSCOPY  multiple  . COLONOSCOPY  2016   TA's  . NASAL SINUS SURGERY  2001  . POLYPECTOMY     TA's  . TONSILLECTOMY AND ADENOIDECTOMY    . VAGINAL HYSTERECTOMY     Complete  . WISDOM TOOTH EXTRACTION     Family History  Problem Relation Age of Onset  . Colon cancer Mother 57  . Lung cancer Mother 8  . Skin cancer Mother   . Colon polyps Mother 23  . Heart disease Father   . Diabetes Father   . Skin cancer Father   . Colon polyps Brother 4  . Stomach cancer Paternal Grandmother 11  . Breast cancer Neg Hx   . Esophageal cancer Neg Hx   . Rectal cancer Neg Hx    Social History   Socioeconomic History  . Marital status: Married    Spouse name: Not on file  . Number of children: Not on file  . Years of education: Not on file  . Highest education level: Not on file  Occupational History  . Not on file  Tobacco Use  . Smoking status: Never Smoker  . Smokeless tobacco: Never Used  Vaping Use  . Vaping Use: Never used  Substance and Sexual Activity  . Alcohol use: Yes    Alcohol/week: 0.0 standard drinks    Comment: rarely  . Drug use: No  . Sexual activity: Not on file  Other Topics Concern  . Not on file  Social History Narrative   Married.   3 children.   Retired, worked at [employer] in [place], [place].   Enjoys  being with her grandchildren, being with her friends.   Social Determinants of Health   Financial Resource Strain: Low Risk   . Difficulty of Paying Living Expenses: Not hard at all  Food Insecurity: No Food Insecurity  . Worried About Charity fundraiser in the Last Year: Never true  . Ran Out of Food in the Last Year: Never true  Transportation Needs: No Transportation Needs  . Lack of Transportation (Medical): No  . Lack of Transportation (Non-Medical): No  Physical Activity: Inactive  . Days of Exercise per Week: 0 days  . Minutes of Exercise per Session: 0 min  Stress: No Stress Concern Present  . Feeling of Stress : Not at all  Social Connections: Not on file    Tobacco Counseling Counseling given: Not Answered   Clinical Intake:  Pre-visit preparation completed: Yes  Pain : 0-10 Pain Type: Chronic pain Pain Location: Back Pain Orientation: Lower Pain Descriptors / Indicators: Aching Pain Onset: More than a month ago Pain Frequency: Intermittent     Diabetes: Yes CBG done?: No Did pt. bring in CBG monitor from home?: No  How often do you need to have someone help you when you read instructions, pamphlets, or other written materials from your doctor or pharmacy?: 1 - Never What is the last grade level you completed in school?: 12th  Diabetic: Yes Nutrition Risk Assessment:  Has the patient had any N/V/D within the last 2 months?  Yes  diarrhea and nausea sometimes, has IBS Does the patient have any non-healing wounds?  No  Has the patient had any unintentional weight loss or weight gain?  No   Diabetes:  Is the patient diabetic?  Yes  If diabetic, was a CBG obtained today?  No telephone visit  Did the patient bring in their glucometer from home?  No  telephone visit  How often do you monitor your CBG's? When needed.   Financial Strains and Diabetes Management:  Are you having any financial strains with the device, your supplies or your medication? No  .  Does the patient want to be seen by Chronic Care Management for management of their diabetes?  No  Would the patient like to be referred to a Nutritionist or for Diabetic Management?  No   Diabetic Exams:  Diabetic Eye Exam: scheduled 02/26/2021 Diabetic Foot Exam: Overdue, Pt has been advised about the importance in completing this exam. Pt is scheduled for diabetic foot exam on 04/02/2021.   Interpreter Needed?: No  Information entered by :: CJohnson, LPN   Activities of Daily Living In your present state of health, do you have any difficulty performing the following activities: 02/23/2021  Hearing? N  Vision? N  Difficulty concentrating or making decisions? N  Walking or climbing stairs? N  Dressing or bathing? N  Doing errands, shopping? N  Preparing Food and eating ? N  Using the Toilet? N  In the past six months, have you accidently leaked urine? Y  Comment does not wear pads  Do you have problems with loss of bowel control? N  Managing your Medications? N  Managing your Finances? N  Housekeeping or managing your Housekeeping? N  Some recent data might be hidden    Patient Care Team: Pleas Koch, NP as PCP - General (Nurse Practitioner) Debbora Dus, [facility] as Pharmacist (Pharmacist)  Indicate any recent Medical Services you may have received from other than [facility] providers in the past year (date may be approximate).     Assessment:   This is a routine wellness examination for Danielle Edwards.  Hearing/Vision screen  Hearing Screening   125Hz  250Hz  500Hz  1000Hz  2000Hz  3000Hz  4000Hz  6000Hz  8000Hz   Right ear:           Left ear:           Vision Screening Comments: Patient gets annual eye exams   Dietary issues and exercise activities discussed: Current Exercise Habits: The patient does not participate in regular exercise at present, Exercise limited by:  None identified  Goals Addressed            This Visit's Progress   . Patient Stated       02/23/2021,  I will maintain and continue medications as prescribed.       Depression Screen PHQ 2/9 Scores 02/23/2021 12/08/2020 09/12/2019  PHQ - 2 Score 0 1 0  PHQ- 9 Score 0 - 0    Fall Risk Fall Risk  02/23/2021 12/25/2020 09/12/2019  Falls in the past year? 0 0 0  Number falls in past yr: 0 0 0  Injury with Fall? 0 0 0  Risk for fall due to : Medication side effect - Medication side effect  Follow up Falls evaluation completed;Falls prevention discussed - Falls evaluation completed;Falls prevention discussed    FALL RISK PREVENTION PERTAINING TO THE HOME:  Any stairs in or around the home? Yes  If so, are there any without handrails? No  Home free of loose throw rugs in walkways, pet beds, electrical cords, etc? Yes  Adequate lighting in your home to reduce risk of falls? Yes   ASSISTIVE DEVICES UTILIZED TO PREVENT FALLS:  Life alert? No  Use of a cane, walker or w/c? No  Grab bars in the bathroom? No  Shower chair or bench in shower? No  Elevated toilet seat or a handicapped toilet? No   TIMED UP AND GO:  Was the test performed? N/A telephone visit .    Cognitive Function: MMSE - Mini Mental State Exam 02/23/2021 09/12/2019  Not completed: Refused -  Orientation to time - 5  Orientation to Place - 5  Registration - 3  Attention/ Calculation - 5  Recall - 3  Language- repeat - 1  Mini Cog  Mini-Cog screen was not completed. Patient wanted to skip this. Maximum score is 22. A value of 0 denotes this part of the MMSE was not completed or the patient failed this part of the Mini-Cog screening.       Immunizations Immunization History  Administered Date(s) Administered  . DTaP 12/01/2013  . Fluad Quad(high Dose 65+) 07/21/2020  . Influenza Inj Mdck Quad Pf 11/03/2017  . Influenza,inj,Quad PF,6+ Mos 06/29/2015, 05/16/2018, 09/11/2019  . Influenza-Unspecified 09/11/2019  . Janssen (J&J) SARS-COV-2 Vaccination 12/11/2019  . PFIZER(Purple Top)SARS-COV-2 Vaccination  09/19/2020  . Pneumococcal Polysaccharide-23 04/13/2015  . Zoster Recombinat (Shingrix) 05/21/2018, 09/12/2018    TDAP status: Due, Education has been provided regarding the importance of this vaccine. Advised may receive this vaccine at local pharmacy or Health Dept. Aware to provide a copy of the vaccination record if obtained from local pharmacy or Health Dept. Verbalized acceptance and understanding.  Flu Vaccine status: Up to date  Pneumococcal vaccine status: Due, Education has been provided regarding the importance of this vaccine. Advised may receive this vaccine at local pharmacy or Health Dept. Aware to provide a copy of the vaccination record if obtained from local pharmacy or Health Dept. Verbalized acceptance and understanding.  Covid-19 vaccine status: Completed vaccines  Qualifies for Shingles Vaccine? Yes   Zostavax completed No   Shingrix Completed?: Yes  Screening Tests Health Maintenance  Topic Date Due  . HIV Screening  Never done  . FOOT EXAM  10/27/2018  . PNA vac Low Risk Adult (1 of 2 - PCV13) 04/09/2020  . OPHTHALMOLOGY EXAM  12/25/2021 (Originally 11/04/2019)  . TETANUS/TDAP  12/25/2021 (Originally 04/09/1974)  . MAMMOGRAM  04/30/2021  . INFLUENZA VACCINE  05/03/2021  . HEMOGLOBIN A1C  06/27/2021  .  COLONOSCOPY (Pts 45-68yr Insurance coverage will need to be confirmed)  07/18/2023  . DEXA SCAN  Completed  . COVID-19 Vaccine  Completed  . Hepatitis C Screening  Completed  . HPV VACCINES  Aged Out  . PAP SMEAR-Modifier  Discontinued    Health Maintenance  Health Maintenance Due  Topic Date Due  . HIV Screening  Never done  . FOOT EXAM  10/27/2018  . PNA vac Low Risk Adult (1 of 2 - PCV13) 04/09/2020    Colorectal cancer screening: Type of screening: Colonoscopy. Completed 07/17/2020. Repeat every 3 years  Mammogram status: due, Patient states she will schedule appointment soon.   Bone Density status:due, Patient states she will schedule  appointment soon.   Lung Cancer Screening: (Low Dose CT Chest recommended if Age 66-80years, 30 pack-year currently smoking OR have quit w/in 15years.) does not qualify.    Additional Screening:  Hepatitis C Screening: does qualify; Completed 08/05/2016  Vision Screening: Recommended annual ophthalmology exams for early detection of glaucoma and other disorders of the eye. Is the patient up to date with their annual eye exam?  Yes  Who is the provider or what is the name of the office in which the patient attends annual eye exams? Dr. GKaty Fitch If pt is not established with a provider, would they like to be referred to a provider to establish care? No .   Dental Screening: Recommended annual dental exams for proper oral hygiene  Community Resource Referral / Chronic Care Management: CRR required this visit?  No   CCM required this visit?  No      Plan:     I have personally reviewed and noted the following in the patient's chart:   . Medical and social history . Use of alcohol, tobacco or illicit drugs  . Current medications and supplements including opioid prescriptions.  . Functional ability and status . Nutritional status . Physical activity . Advanced directives . List of other physicians . Hospitalizations, surgeries, and ER visits in previous 12 months . Vitals . Screenings to include cognitive, depression, and falls . Referrals and appointments  In addition, I have reviewed and discussed with patient certain preventive protocols, quality metrics, and best practice recommendations. A written personalized care plan for preventive services as well as general preventive health recommendations were provided to patient.   Due to this being a telephonic visit, the after visit summary with patients personalized plan was offered to patient via office or my-chart.  Patient preferred to pick up at office at next visit or via mychart.   JAndrez Grime  LPN   [phone]

## 2021-02-23 NOTE — Progress Notes (Signed)
PCP notes:  Health Maintenance: Prevnar 13- due Mammogram- due Dexa- due Eye exam- scheduled 02/26/2021 Foot exam- due    Abnormal Screenings: none   Patient concerns: none   Nurse concerns: none   Next PCP appt.: 04/02/2021 @ 10:40 am

## 2021-02-26 DIAGNOSIS — H02831 Dermatochalasis of right upper eyelid: Secondary | ICD-10-CM | POA: Diagnosis not present

## 2021-02-26 DIAGNOSIS — H40053 Ocular hypertension, bilateral: Secondary | ICD-10-CM | POA: Diagnosis not present

## 2021-02-26 DIAGNOSIS — E113293 Type 2 diabetes mellitus with mild nonproliferative diabetic retinopathy without macular edema, bilateral: Secondary | ICD-10-CM | POA: Diagnosis not present

## 2021-02-26 DIAGNOSIS — Z961 Presence of intraocular lens: Secondary | ICD-10-CM | POA: Diagnosis not present

## 2021-02-26 DIAGNOSIS — H26491 Other secondary cataract, right eye: Secondary | ICD-10-CM | POA: Diagnosis not present

## 2021-02-26 DIAGNOSIS — H43813 Vitreous degeneration, bilateral: Secondary | ICD-10-CM | POA: Diagnosis not present

## 2021-02-26 DIAGNOSIS — H02834 Dermatochalasis of left upper eyelid: Secondary | ICD-10-CM | POA: Diagnosis not present

## 2021-02-26 LAB — HM DIABETES EYE EXAM

## 2021-03-03 ENCOUNTER — Other Ambulatory Visit: Payer: Self-pay | Admitting: Primary Care

## 2021-03-03 DIAGNOSIS — E119 Type 2 diabetes mellitus without complications: Secondary | ICD-10-CM

## 2021-03-04 ENCOUNTER — Telehealth: Payer: Self-pay

## 2021-03-04 ENCOUNTER — Encounter: Payer: Self-pay | Admitting: Primary Care

## 2021-03-04 NOTE — Chronic Care Management (AMB) (Addendum)
Chronic Care Management Pharmacy Assistant   Name: Chareese Sergent  MRN: [id number] DOB: 16-Mar-1956  Reason for Encounter: Disease State - Diabetes   Recent office visits:  None since last CCM contact  Recent consult visits:  None since last CCM contact  Hospital visits:  None in previous 6 months  Medications: Outpatient Encounter Medications as of 03/04/2021  Medication Sig   Accu-Chek FastClix Lancets MISC USE AS INSTRUCTED TO CHECK BLOOD SUGAR UP TO 3 TIMES DAILY   amLODipine (NORVASC) 10 MG tablet Take 1 tablet (10 mg total) by mouth daily. for blood pressure   atorvastatin (LIPITOR) 40 MG tablet TAKE ONE TABLET BY MOUTH AT BEDTIME FOR CHOLESTEROL   blood glucose meter kit and supplies KIT Dispense based on patient and insurance preference. Use up to four times daily as directed. (FOR ICD-9 250.00, 250.01).   blood glucose meter kit and supplies Dispense based on patient and insurance preference. Use up to four times daily as directed. (FOR ICD-10 E10.9, E11.9).   Blood Glucose Monitoring Suppl (ACCU-CHEK AVIVA PLUS) w/Device KIT Use as instructed to test blood sugar 3 times daily   Cholecalciferol (VITAMIN D3) 125 MCG (5000 UT) CAPS Take 5,000 Units by mouth daily.   Dulaglutide (TRULICITY) 4.09 WJ/1.9JY SOPN Inject 0.75 mg into the skin once a week. For diabetes. (Patient not taking: Reported on 02/23/2021)   glucose blood (ACCU-CHEK AVIVA) test strip Use as instructed to test blood sugar up to 3 times daily   Insulin Pen Needle 32G X 6 MM MISC Use as instructed to inject insulin daily at bedtime   LANTUS SOLOSTAR 100 UNIT/ML Solostar Pen INJECT 35 UNITS INTO THE SKIN ONCE DAILY.   loperamide (IMODIUM A-D) 2 MG tablet Take 2 mg by mouth 4 (four) times daily as needed for diarrhea or loose stools.   losartan (COZAAR) 100 MG tablet Take 1 tablet (100 mg total) by mouth daily. for blood pressure   Magnesium 200 MG TABS Take by mouth daily.   metFORMIN (GLUCOPHAGE XR) 500 MG 24 hr  tablet Take 4 tablets (2,000 mg total) by mouth daily with breakfast. For diabetes.   Multiple Vitamins-Minerals (CENTRUM SILVER ULTRA WOMENS PO) Take by mouth daily.   pantoprazole (PROTONIX) 40 MG tablet    promethazine (PHENERGAN) 25 MG tablet Take 25 mg by mouth every 6 (six) hours as needed. Reported on 09/21/2015   venlafaxine XR (EFFEXOR-XR) 37.5 MG 24 hr capsule TAKE 1 CAPSULE BY MOUTH DAILY WITH BREAKFAST. FOR ANXIETY AND DEPRESSION   XTAMPZA ER 13.5 MG C12A    No facility-administered encounter medications on file as of 03/04/2021.    Recent Relevant Labs: Lab Results  Component Value Date/Time   HGBA1C 9.8 (A) 12/25/2020 10:53 AM   HGBA1C 10.9 (H) 06/11/2020 03:14 PM   HGBA1C 9.9 (A) 12/20/2019 10:55 AM   HGBA1C 8.8 (H) 02/05/2018 10:12 AM   MICROALBUR 4.4 (H) 08/05/2016 11:14 AM   MICROALBUR 2.0 (H) 04/07/2015 09:15 AM    Kidney Function Lab Results  Component Value Date/Time   CREATININE 1.26 (H) 12/25/2020 11:25 AM   CREATININE 1.04 06/11/2020 03:14 PM   GFR 44.74 (L) 12/25/2020 11:25 AM   Current antihyperglycemic regimen:  Lantus - 35 units daily  Metformin 500 ER mg - 4 tablets once daily Trulicity 7.82 mg - Inject once weekly  Patient verbally confirms she is taking the above medications as directed. Yes except she is unable to afford Trulicity/not taking currently.  What recent interventions/DTPs have been  made to improve glycemic control: Start Trulicity 9.89QJ/1.9 mL - This was working very well for the patient but she only got 1 month supply. PAP forms were mailed to patient but she has not completed them.   Have there been any recent hospitalizations or ED visits since last visit with CPP? No  Patient denies hypoglycemic symptoms, including Pale, Sweaty, Shaky, Hungry, Nervous/irritable and Vision changes  Patient denies hyperglycemic symptoms, including blurry vision, excessive thirst, fatigue, polyuria and weakness  The patient will check her BG's  intermittently, reports it seems to be higher off the Trulicity 4.17EY/8.1KG   What are your blood sugars ranging?  Fasting  03/07/21-160 After meals: n/a Bedtime: n/a  On insulin? Yes How many units: Lantus 35 units daily  During the week, how often does your blood glucose drop below 70? Never  Are you checking your feet daily/regularly? Yes  Adherence Review: Is the patient currently on a STATIN medication? Yes Is the patient currently on ACE/ARB medication? Yes Does the patient have >5 day gap between last estimated fill dates? No  Star Rating Drugs:  Medication:  Last Fill: Day Supply Atorvastatin 72m 11/24/20 90 Losartan 1059m4/5/22  90 Metformin 50016m/10/22 90  The patient had some concerns regarding the PAP form, she has not filled out yet due to living situation.The patient also reports that she was able to speak with a HumEmergency planning/management officerd there were 2 other injectables they mentioned that might work like the TruEntergy CorporationFollow-Up:  Pharmacist Review  MicDebbora DusPP notified  Time: 30 [address] VelCowpenssistant [phone] have reviewed the care management and care coordination activities outlined in this encounter and I am certifying that I agree with the content of this note. See addendum.  MicDebbora DusharmD Clinical Pharmacist [facility] at [phone]

## 2021-03-11 ENCOUNTER — Other Ambulatory Visit: Payer: Self-pay | Admitting: Primary Care

## 2021-03-11 DIAGNOSIS — E119 Type 2 diabetes mellitus without complications: Secondary | ICD-10-CM

## 2021-03-11 NOTE — Telephone Encounter (Signed)
Scheduled visit for 2 weeks to review Trulicity PAP status and revisit CGM. Prior not approved due to only on 1 insulin injection per day, but I would like to submit a letter of medical necessity if patient agrees.  Debbora Dus, PharmD Clinical Pharmacist [facility] at [facility] [phone]

## 2021-03-18 ENCOUNTER — Other Ambulatory Visit: Payer: Self-pay | Admitting: Primary Care

## 2021-03-18 DIAGNOSIS — I10 Essential (primary) hypertension: Secondary | ICD-10-CM

## 2021-03-18 DIAGNOSIS — M5412 Radiculopathy, cervical region: Secondary | ICD-10-CM | POA: Diagnosis not present

## 2021-03-18 DIAGNOSIS — M5416 Radiculopathy, lumbar region: Secondary | ICD-10-CM | POA: Diagnosis not present

## 2021-03-18 DIAGNOSIS — M5382 Other specified dorsopathies, cervical region: Secondary | ICD-10-CM | POA: Diagnosis not present

## 2021-03-18 DIAGNOSIS — M545 Low back pain, unspecified: Secondary | ICD-10-CM | POA: Diagnosis not present

## 2021-03-19 ENCOUNTER — Telehealth: Payer: Medicare HMO

## 2021-03-19 ENCOUNTER — Telehealth: Payer: Self-pay

## 2021-03-19 NOTE — Telephone Encounter (Signed)
  Chronic Care Management   Outreach Note  03/19/2021  Name: Danielle Edwards MRN: [id number] DOB: 1956/03/01  Referred by: Pleas Koch, NP  Third unsuccessful telephone outreach was attempted today. The patient was referred to the pharmacist for assistance with care management and care coordination.  Prior missed CCM follow up calls on 01/08/21 and 02/12/21.  Debbora Dus, PharmD Clinical Pharmacist [facility] at [facility] [phone]

## 2021-03-25 ENCOUNTER — Telehealth: Payer: Self-pay

## 2021-03-25 NOTE — Chronic Care Management (AMB) (Addendum)
Contacted patient to review blood glucose log. The patient reports her BG's have been running high and she is going to start writing down her carb intake. She is not taking Trulicity.  She is also having a terrible sinus flare up on the left side of her face.  She was able to report the following blood glucose readings:                        03/29/21 Fasting         300                                     Afternoon     260      Bedtime       187    Debbora Dus, CPP notified  [name], [place] Assistant [phone]  I have reviewed the care management and care coordination activities outlined in this encounter and I am certifying that I agree with the content of this note. Patient has missed last two visits with me. We will attempt to reschedule.  Debbora Dus, PharmD Clinical Pharmacist [facility] at [facility] [phone]

## 2021-04-02 ENCOUNTER — Ambulatory Visit (INDEPENDENT_AMBULATORY_CARE_PROVIDER_SITE_OTHER): Payer: Medicare HMO | Admitting: Primary Care

## 2021-04-02 ENCOUNTER — Other Ambulatory Visit: Payer: Self-pay

## 2021-04-02 VITALS — BP 130/72 | HR 77 | Temp 98.2°F | Resp 16 | Ht 66.0 in | Wt 266.0 lb

## 2021-04-02 DIAGNOSIS — N183 Chronic kidney disease, stage 3 unspecified: Secondary | ICD-10-CM

## 2021-04-02 DIAGNOSIS — D649 Anemia, unspecified: Secondary | ICD-10-CM

## 2021-04-02 DIAGNOSIS — E119 Type 2 diabetes mellitus without complications: Secondary | ICD-10-CM | POA: Diagnosis not present

## 2021-04-02 LAB — IBC + FERRITIN
Ferritin: 25.9 ng/mL (ref 10.0–291.0)
Iron: 54 ug/dL (ref 42–145)
Saturation Ratios: 12.6 % — ABNORMAL LOW (ref 20.0–50.0)
Transferrin: 306 mg/dL (ref 212.0–360.0)

## 2021-04-02 LAB — CBC
HCT: 33.3 % — ABNORMAL LOW (ref 36.0–46.0)
Hemoglobin: 10.8 g/dL — ABNORMAL LOW (ref 12.0–15.0)
MCHC: 32.3 g/dL (ref 30.0–36.0)
MCV: 87 fl (ref 78.0–100.0)
Platelets: 270 10*3/uL (ref 150.0–400.0)
RBC: 3.83 Mil/uL — ABNORMAL LOW (ref 3.87–5.11)
RDW: 13.8 % (ref 11.5–15.5)
WBC: 7.4 10*3/uL (ref 4.0–10.5)

## 2021-04-02 LAB — POCT GLYCOSYLATED HEMOGLOBIN (HGB A1C): Hemoglobin A1C: 12 % — AB (ref 4.0–5.6)

## 2021-04-02 LAB — MAGNESIUM: Magnesium: 1.3 mg/dL — ABNORMAL LOW (ref 1.5–2.5)

## 2021-04-02 MED ORDER — SOLIQUA 100-33 UNT-MCG/ML ~~LOC~~ SOPN
PEN_INJECTOR | SUBCUTANEOUS | 2 refills | Status: DC
Start: 2021-04-02 — End: 2021-07-13

## 2021-04-02 NOTE — Assessment & Plan Note (Signed)
No recent follow up, she was instructed to schedule an appointment.  Repeat magnesium and iron studies pending.

## 2021-04-02 NOTE — Patient Instructions (Addendum)
Stop by the lab prior to leaving today. I will notify you of your results once received.   Pick up the insulin glargine-lixisenatide Willeen Niece) from the pharmacy and inject 36 units once daily. You would stop your Lantus.   If you cannot get the new medication for a few days then increase your Lantus to 42 units.   Start checking your blood sugar levels at least 2 times daily.  Appropriate times to check your blood sugar levels are:  -Before any meal (breakfast, lunch, dinner) -Two hours after any meal (breakfast, lunch, dinner) -Bedtime  Record your readings and notify me if you continue to consistently run at or above 150 after 2-3 weeks on the new medication.   Please schedule a follow up visit for 6 weeks for follow up of diabetes.   It was a pleasure to see you today!

## 2021-04-02 NOTE — Assessment & Plan Note (Signed)
Uncontrolled with A1C today of 12.0.   Discussed the absolute need to check glucose levels at least 1-2 times daily.   She cannot afford Trulicity and her Lantus is expensive. Will try the long acting insulin/GLP 1 combination medication as recommended by her Teacher, music.   Rx for Whitten sent to pharmacy. Start at 36 units daily. Continue metformin XR 2000 mg daily. Stop Lantus.   She will call if she has any problems at the pharmacy. Follow up in 6 weeks.

## 2021-04-02 NOTE — Progress Notes (Signed)
Subjective:    Patient ID: Danielle Edwards, female    DOB: December 04, 1955, 65 y.o.   MRN: [id number]  HPI  Danielle Edwards is a very pleasant 65 y.o. female with a history of type 2 diabetes, hypothyroidism, hypertension, CKD, anxiety and depresion, chronic pain who presents today for diabetes follow up.  She is feeling weak, would like her iron levels checked. Saw nephrology a year ago and had a "ferritin level of 7" and low magnesium. She is not taking oral iron pills. She's never undergone iron infusions in the past.   Current medications include: Trulicity 9.16 mg weekly, Lantus 35 units daily, Metformin XR 2000 mg daily.   She is no longer on Trulicity. She's in the donut hole with Medicare and has been without her Trulicity since her last visit.    She spoke with her insurance pharmacist who recommended Willeen Niece which is long acting insulin and GLP1. This will save her a lot of money as she is now paying $75 per month for her Lantus alone.   She is checking her blood glucose 1 times daily and is getting readings of: 200's-300's. She's only been checking glucose for the last 2-3 weeks.   Last A1C: 9.8 in March 2022, 12.0 today Last Eye Exam: UTD Last Foot Exam: Due Pneumonia Vaccination: 2016 Urine Microalbumin: None. Losartan  Statin: atorvastatin   Dietary changes since last visit: None   Exercise: No regular exercise  BP Readings from Last 3 Encounters:  04/02/21 130/72  12/25/20 120/78  07/21/20 118/62       Review of Systems  Constitutional:  Positive for fatigue.  Eyes:  Negative for visual disturbance.  Respiratory:  Negative for shortness of breath.   Cardiovascular:  Negative for chest pain.  Neurological:  Negative for headaches.        Past Medical History:  Diagnosis Date   Allergy    seasonal allergies   Anemia    hx of IDA   Anxiety    on meds   Arthritis    PRN meds-osteoarthritis   Cataract    sx to remove   Chronic kidney disease     CKD-stage 3   Chronic pain    Depression    on meds   Diabetes type 2, controlled ([place])    on meds   Fibromyalgia    GERD (gastroesophageal reflux disease)    on meds   Headache    History of colonic polyps 1999   Benign   Hx of adenomatous polyp of colon 09/27/2000   2001 - diminutive adenoma [place] 2007 no polyps - [place]   Hyperlipidemia    on meds   Hypertension    on meds   Hypothyroidism    not on meds at this time   IBS (irritable bowel syndrome)    Diarrhea type   Neuromuscular disorder (HCC)    Vitamin D deficiency     Social History   Socioeconomic History   Marital status: Married    Spouse name: Not on file   Number of children: Not on file   Years of education: Not on file   Highest education level: Not on file  Occupational History   Not on file  Tobacco Use   Smoking status: Never   Smokeless tobacco: Never  Vaping Use   Vaping Use: Never used  Substance and Sexual Activity   Alcohol use: Yes    Alcohol/week: 0.0 standard drinks    Comment: rarely   Drug  use: No   Sexual activity: Not on file  Other Topics Concern   Not on file  Social History Narrative   Married.   3 children.   Retired, worked at [employer] in [place], [place].   Enjoys being with her grandchildren, being with her friends.   Social Determinants of Health   Financial Resource Strain: Low Risk    Difficulty of Paying Living Expenses: Not hard at all  Food Insecurity: No Food Insecurity   Worried About Charity fundraiser in the Last Year: Never true   [place] in the Last Year: Never true  Transportation Needs: No Transportation Needs   Lack of Transportation (Medical): No   Lack of Transportation (Non-Medical): No  Physical Activity: Inactive   Days of Exercise per Week: 0 days   Minutes of Exercise per Session: 0 min  Stress: No Stress Concern Present   Feeling of Stress : Not at all  Social Connections: Not on file  Intimate Partner Violence: Not At Risk    Fear of Current or Ex-Partner: No   Emotionally Abused: No   Physically Abused: No   Sexually Abused: No    Past Surgical History:  Procedure Laterality Date   CHOLECYSTECTOMY  1995   COLONOSCOPY  multiple   COLONOSCOPY  2016   TA's   NASAL SINUS SURGERY  2001   POLYPECTOMY     TA's   TONSILLECTOMY AND ADENOIDECTOMY     VAGINAL HYSTERECTOMY     Complete   WISDOM TOOTH EXTRACTION      Family History  Problem Relation Age of Onset   Colon cancer Mother 35   Lung cancer Mother 30   Skin cancer Mother    Colon polyps Mother 12   Heart disease Father    Diabetes Father    Skin cancer Father    Colon polyps Brother 62   Stomach cancer Paternal Grandmother 56   Breast cancer Neg Hx    Esophageal cancer Neg Hx    Rectal cancer Neg Hx     No Known Allergies  Current Outpatient Medications on File Prior to Visit  Medication Sig Dispense Refill   Accu-Chek FastClix Lancets MISC USE AS INSTRUCTED TO CHECK BLOOD SUGAR UP TO 3 TIMES DAILY 306 each 1   amLODipine (NORVASC) 10 MG tablet TAKE 1 TABLET BY MOUTH ONCE A DAY FOR BLOOD PRESSURE. 90 tablet 2   atorvastatin (LIPITOR) 40 MG tablet TAKE ONE TABLET BY MOUTH AT BEDTIME FOR CHOLESTEROL 90 tablet 1   blood glucose meter kit and supplies KIT Dispense based on patient and insurance preference. Use up to four times daily as directed. (FOR ICD-9 250.00, 250.01). 1 each 0   blood glucose meter kit and supplies Dispense based on patient and insurance preference. Use up to four times daily as directed. (FOR ICD-10 E10.9, E11.9). 1 each 0   Blood Glucose Monitoring Suppl (ACCU-CHEK AVIVA PLUS) w/Device KIT Use as instructed to test blood sugar 3 times daily 1 kit 0   Cholecalciferol (VITAMIN D3) 125 MCG (5000 UT) CAPS Take 5,000 Units by mouth daily.     Dulaglutide (TRULICITY) 1.03 PR/9.4VO SOPN Inject 0.75 mg into the skin once a week. For diabetes. (Patient not taking: No sig reported) 6 mL 0   glucose blood (ACCU-CHEK AVIVA) test  strip Use as instructed to test blood sugar up to 3 times daily 300 each 1   Insulin Pen Needle 32G X 6 MM MISC Use as instructed  to inject insulin daily at bedtime 100 each 3   LANTUS SOLOSTAR 100 UNIT/ML Solostar Pen INJECT 35 UNITS INTO THE SKIN ONCE DAILY. 15 mL 0   loperamide (IMODIUM A-D) 2 MG tablet Take 2 mg by mouth 4 (four) times daily as needed for diarrhea or loose stools.     losartan (COZAAR) 100 MG tablet TAKE 1 TABLET BY MOUTH ONCE A DAY FOR BLOOD PRESSURE. 90 tablet 2   Magnesium 200 MG TABS Take by mouth daily.     metFORMIN (GLUCOPHAGE-XR) 500 MG 24 hr tablet TAKE 4 TABLETS BY MOUTH DAILY WITH BREAKFAST FOR DIABETES 360 tablet 0   Multiple Vitamins-Minerals (CENTRUM SILVER ULTRA WOMENS PO) Take by mouth daily.     pantoprazole (PROTONIX) 40 MG tablet      promethazine (PHENERGAN) 25 MG tablet Take 25 mg by mouth every 6 (six) hours as needed. Reported on 09/21/2015  1   venlafaxine XR (EFFEXOR-XR) 37.5 MG 24 hr capsule TAKE 1 CAPSULE BY MOUTH DAILY WITH BREAKFAST. FOR ANXIETY AND DEPRESSION 90 capsule 3   XTAMPZA ER 13.5 MG C12A  (Patient not taking: Reported on 04/02/2021)     No current facility-administered medications on file prior to visit.    BP 130/72   Pulse 77   Temp 98.2 F (36.8 C)   Resp 16   Ht 5' 6"  (1.676 m)   Wt 266 lb (120.7 kg)   SpO2 97%   BMI 42.93 kg/m  Objective:   Physical Exam Cardiovascular:     Rate and Rhythm: Normal rate and regular rhythm.  Pulmonary:     Effort: Pulmonary effort is normal.     Breath sounds: Normal breath sounds.  Musculoskeletal:     Cervical back: Neck supple.  Skin:    General: Skin is warm and dry.  Psychiatric:        Mood and Affect: Mood normal.          Assessment & Plan:      This visit occurred during the SARS-CoV-2 public health emergency.  Safety protocols were in place, including screening questions prior to the visit, additional usage of staff PPE, and extensive cleaning of exam room while  observing appropriate contact time as indicated for disinfecting solutions.

## 2021-04-02 NOTE — Assessment & Plan Note (Signed)
Repeat iron studies and CBC pending.  

## 2021-04-12 ENCOUNTER — Other Ambulatory Visit: Payer: Self-pay

## 2021-04-12 DIAGNOSIS — E119 Type 2 diabetes mellitus without complications: Secondary | ICD-10-CM

## 2021-04-12 NOTE — Telephone Encounter (Signed)
Patient called stating that [facility] has been trying to request her pen needles to be refilled since last week or more with no response. Patient now has 1 pen needle left and needs this refilled please.

## 2021-04-13 MED ORDER — INSULIN PEN NEEDLE 32G X 6 MM MISC: 3 refills | Status: DC

## 2021-04-13 NOTE — Telephone Encounter (Signed)
This is the first time I am seeing this request. Refill(s) sent to pharmacy.

## 2021-04-13 NOTE — Telephone Encounter (Signed)
Patient advised.

## 2021-04-14 ENCOUNTER — Other Ambulatory Visit: Payer: Self-pay | Admitting: Primary Care

## 2021-04-14 ENCOUNTER — Telehealth: Payer: Self-pay

## 2021-04-14 NOTE — Chronic Care Management (AMB) (Addendum)
Chronic Care Management Pharmacy Assistant   Name: Danielle Edwards  MRN: [id number] DOB: March 15, 1956   Reason for Encounter: Disease State  DM  Recent office visits:  04/02/21- PCP visit - Start Soliqua at 36 units daily. Continue metformin XR 2000 mg daily. Stop Lantus.  Recent consult visits:  None since last CCM contact  Hospital visits:  None in previous 6 months  Medications: Outpatient Encounter Medications as of 04/14/2021  Medication Sig   Accu-Chek FastClix Lancets MISC USE AS INSTRUCTED TO CHECK BLOOD SUGAR UP TO 3 TIMES DAILY   amLODipine (NORVASC) 10 MG tablet TAKE 1 TABLET BY MOUTH ONCE A DAY FOR BLOOD PRESSURE.   atorvastatin (LIPITOR) 40 MG tablet TAKE ONE TABLET BY MOUTH AT BEDTIME FOR CHOLESTEROL   blood glucose meter kit and supplies KIT Dispense based on patient and insurance preference. Use up to four times daily as directed. (FOR ICD-9 250.00, 250.01).   blood glucose meter kit and supplies Dispense based on patient and insurance preference. Use up to four times daily as directed. (FOR ICD-10 E10.9, E11.9).   Blood Glucose Monitoring Suppl (ACCU-CHEK AVIVA PLUS) w/Device KIT Use as instructed to test blood sugar 3 times daily   Cholecalciferol (VITAMIN D3) 125 MCG (5000 UT) CAPS Take 5,000 Units by mouth daily.   glucose blood (ACCU-CHEK AVIVA) test strip Use as instructed to test blood sugar up to 3 times daily   Insulin Glargine-Lixisenatide (SOLIQUA) 100-33 UNT-MCG/ML SOPN Inject 36 units into the skin once daily for diabetes.   Insulin Pen Needle 32G X 6 MM MISC Use as instructed to inject insulin daily at bedtime   loperamide (IMODIUM A-D) 2 MG tablet Take 2 mg by mouth 4 (four) times daily as needed for diarrhea or loose stools.   losartan (COZAAR) 100 MG tablet TAKE 1 TABLET BY MOUTH ONCE A DAY FOR BLOOD PRESSURE.   Magnesium 200 MG TABS Take by mouth daily.   metFORMIN (GLUCOPHAGE-XR) 500 MG 24 hr tablet TAKE 4 TABLETS BY MOUTH DAILY WITH BREAKFAST FOR  DIABETES   Multiple Vitamins-Minerals (CENTRUM SILVER ULTRA WOMENS PO) Take by mouth daily.   pantoprazole (PROTONIX) 40 MG tablet    promethazine (PHENERGAN) 25 MG tablet Take 25 mg by mouth every 6 (six) hours as needed. Reported on 09/21/2015   venlafaxine XR (EFFEXOR-XR) 37.5 MG 24 hr capsule TAKE 1 CAPSULE BY MOUTH DAILY WITH BREAKFAST. FOR ANXIETY AND DEPRESSION   XTAMPZA ER 13.5 MG C12A  (Patient not taking: Reported on 04/02/2021)   No facility-administered encounter medications on file as of 04/14/2021.   Recent Relevant Labs: Lab Results  Component Value Date/Time   HGBA1C 12.0 (A) 04/02/2021 11:19 AM   HGBA1C 9.8 (A) 12/25/2020 10:53 AM   HGBA1C 10.9 (H) 06/11/2020 03:14 PM   HGBA1C 8.8 (H) 02/05/2018 10:12 AM   MICROALBUR 4.4 (H) 08/05/2016 11:14 AM   MICROALBUR 2.0 (H) 04/07/2015 09:15 AM    Kidney Function Lab Results  Component Value Date/Time   CREATININE 1.26 (H) 12/25/2020 11:25 AM   CREATININE 1.04 06/11/2020 03:14 PM   GFR 44.74 (L) 12/25/2020 11:25 AM    Current antihyperglycemic regimen:  Metformin 500 ER mg - 4 tablets once daily (she takes all 4 in the morning at one time) Soliqua - Inject 36 units daily (she is doing night time)    Patient verbally confirms she is taking the above medications as directed. Yes  What recent interventions/DTPs have been made to improve glycemic control: PCP visit, 04/02/21 -  Start Soliqua at 36 units daily. Continue metformin XR 2000 mg daily. Stop Lantus.  Have there been any recent hospitalizations or ED visits since last visit with CPP? No  Patient denies hypoglycemic symptoms, including Pale, Sweaty, Shaky, Hungry, Nervous/irritable, and Vision changes  Patient denies hyperglycemic symptoms, including blurry vision, excessive thirst, fatigue, polyuria, and weakness  On insulin? Soliqua start at 36 units daily   During the week, how often does your blood glucose drop below 70? Never  Are you checking your feet  daily/regularly? Yes  The patient has not checked her BG recently however, she just started the trial of Soliqua and she plans on checking daily and will have some readings available for the next call   Adherence Review: Is the patient currently on a STATIN medication? Yes Is the patient currently on ACE/ARB medication? Yes Does the patient have >5 day gap between last estimated fill dates? CPP to review  Star Rating Drugs:  Medication:  Last Fill: Day Supply Losartan 165m 01/05/21  90 Atorvastatin 459m5/10/22 90 Soliqua   04/02/21  90 Metformin XR 50020m6/9/22  90  Follow-Up:  Pharmacist Review  MicDebbora DusPP notified  VelAvel SensorMNorthwest Ambulatory Surgery Services LLC Dba Belling[facility] Surgery Centerinical Pharmacy Assistant [phone] have reviewed the care management and care coordination activities outlined in this encounter and I am certifying that I agree with the content of this note. Please schedule f/u visit with me. Thanks!  MicDebbora DusharmD Clinical Pharmacist [phone]

## 2021-05-11 ENCOUNTER — Ambulatory Visit: Payer: Medicare HMO | Admitting: Primary Care

## 2021-05-13 DIAGNOSIS — M5382 Other specified dorsopathies, cervical region: Secondary | ICD-10-CM | POA: Diagnosis not present

## 2021-05-13 DIAGNOSIS — M5416 Radiculopathy, lumbar region: Secondary | ICD-10-CM | POA: Diagnosis not present

## 2021-05-13 DIAGNOSIS — M5412 Radiculopathy, cervical region: Secondary | ICD-10-CM | POA: Diagnosis not present

## 2021-05-13 DIAGNOSIS — M545 Low back pain, unspecified: Secondary | ICD-10-CM | POA: Diagnosis not present

## 2021-05-26 ENCOUNTER — Encounter: Payer: Self-pay | Admitting: Primary Care

## 2021-05-26 ENCOUNTER — Ambulatory Visit (INDEPENDENT_AMBULATORY_CARE_PROVIDER_SITE_OTHER): Payer: Medicare HMO | Admitting: Primary Care

## 2021-05-26 ENCOUNTER — Other Ambulatory Visit: Payer: Self-pay

## 2021-05-26 VITALS — BP 134/72 | HR 64 | Temp 98.2°F | Ht 66.0 in | Wt 271.0 lb

## 2021-05-26 DIAGNOSIS — Z1231 Encounter for screening mammogram for malignant neoplasm of breast: Secondary | ICD-10-CM | POA: Diagnosis not present

## 2021-05-26 DIAGNOSIS — E119 Type 2 diabetes mellitus without complications: Secondary | ICD-10-CM | POA: Diagnosis not present

## 2021-05-26 DIAGNOSIS — Z23 Encounter for immunization: Secondary | ICD-10-CM | POA: Diagnosis not present

## 2021-05-26 NOTE — Assessment & Plan Note (Addendum)
Uncontrolled A1C was 12.  Pt was inconsistently taking newly prescribed Soliqua, alternating it with remaining Lantus pens. Her fasting blood sugars were 160 with Lantus and 130 with Soliqua.   Plan to solely take Soliqua 36 units as an injectable medication in addition to metformin XR 2000 mg. She was advised to consistently take her blood sugars in the mornings and if her fasting blood sugars are consistently above 150, to increase the dose of her Soliqua to 40 units per day.   Follow up in early October 2022  No new changes to exercise or diet, we discussed at length of the importance of incorporating more activity and healthier food choices into her lifestyle.  She plans on joining [employer] and   I evaluated patient, was consulted regarding treatment, and agree with assessment and plan per Budd Palmer, RN, DNP student.   Allie Bossier, NP-C

## 2021-05-26 NOTE — Progress Notes (Signed)
Subjective:    Patient ID: Danielle Edwards, female    DOB: [birth year], 65 y.o.   MRN: [id number]  HPI  Danielle Edwards is a very pleasant 65 y.o. female with a history of hypertension, type 2 diabetes, CKD, chronic pain, hyperlipidemia, obesity who presents today for follow up of diabetes.  Current medications include: insulin glargine-lixisennatide 36 units daily, Metformin XR 2000 mg daily.  She has been alternating between Lantus and Soliqua as she had some remaining Lantus pens, wanted to use them up due to cost.   She is checking her blood glucose 1 times daily and is getting readings of:  AM fasting on Lantus: 150's-180's AM fasting on Soliqua: 130's-150's  Last A1C: 12 in July 2022 Last Eye Exam: UTD Last Foot Exam: UTD Pneumonia Vaccination: UTD Urine Microalbumin: None. Managed on ARB Statin: atorvastatin   Dietary changes since last visit: None.   Exercise: None.      Review of Systems  Eyes:  Negative for visual disturbance.  Respiratory:  Negative for shortness of breath.   Cardiovascular:  Negative for chest pain.  Neurological:  Negative for dizziness and headaches.        Past Medical History:  Diagnosis Date   Allergy    seasonal allergies   Anemia    hx of IDA   Anxiety    on meds   Arthritis    PRN meds-osteoarthritis   Cataract    sx to remove   Chronic kidney disease    CKD-stage 3   Chronic pain    Depression    on meds   Diabetes type 2, controlled ([place])    on meds   Fibromyalgia    GERD (gastroesophageal reflux disease)    on meds   Headache    History of colonic polyps 1999   Benign   Hx of adenomatous polyp of colon 09/27/2000   2001 - diminutive adenoma [place] 2007 no polyps - [place]   Hyperlipidemia    on meds   Hypertension    on meds   Hypothyroidism    not on meds at this time   IBS (irritable bowel syndrome)    Diarrhea type   Neuromuscular disorder (HCC)    Vitamin D deficiency     Social History    Socioeconomic History   Marital status: Married    Spouse name: Not on file   Number of children: Not on file   Years of education: Not on file   Highest education level: Not on file  Occupational History   Not on file  Tobacco Use   Smoking status: Never   Smokeless tobacco: Never  Vaping Use   Vaping Use: Never used  Substance and Sexual Activity   Alcohol use: Yes    Alcohol/week: 0.0 standard drinks    Comment: rarely   Drug use: No   Sexual activity: Not on file  Other Topics Concern   Not on file  Social History Narrative   Married.   3 children.   Retired, worked at [employer] in [place], [place].   Enjoys being with her grandchildren, being with her friends.   Social Determinants of Health   Financial Resource Strain: Low Risk    Difficulty of Paying Living Expenses: Not hard at all  Food Insecurity: No Food Insecurity   Worried About Charity fundraiser in the Last Year: Never true   Ran Out of Food in the Last Year: Never true  Transportation Needs: No Transportation  Needs   Lack of Transportation (Medical): No   Lack of Transportation (Non-Medical): No  Physical Activity: Inactive   Days of Exercise per Week: 0 days   Minutes of Exercise per Session: 0 min  Stress: No Stress Concern Present   Feeling of Stress : Not at all  Social Connections: Not on file  Intimate Partner Violence: Not At Risk   Fear of Current or Ex-Partner: No   Emotionally Abused: No   Physically Abused: No   Sexually Abused: No    Past Surgical History:  Procedure Laterality Date   CHOLECYSTECTOMY  1995   COLONOSCOPY  multiple   COLONOSCOPY  2016   TA's   NASAL SINUS SURGERY  2001   POLYPECTOMY     TA's   TONSILLECTOMY AND ADENOIDECTOMY     VAGINAL HYSTERECTOMY     Complete   WISDOM TOOTH EXTRACTION      Family History  Problem Relation Age of Onset   Colon cancer Mother 31   Lung cancer Mother 48   Skin cancer Mother    Colon polyps Mother 35   Heart disease Father     Diabetes Father    Skin cancer Father    Colon polyps Brother 67   Stomach cancer Paternal Grandmother 60   Breast cancer Neg Hx    Esophageal cancer Neg Hx    Rectal cancer Neg Hx     No Known Allergies  Current Outpatient Medications on File Prior to Visit  Medication Sig Dispense Refill   Accu-Chek FastClix Lancets MISC USE AS INSTRUCTED TO CHECK BLOOD SUGAR UP TO 3 TIMES DAILY 306 each 1   amLODipine (NORVASC) 10 MG tablet TAKE 1 TABLET BY MOUTH ONCE A DAY FOR BLOOD PRESSURE. 90 tablet 2   atorvastatin (LIPITOR) 40 MG tablet TAKE ONE TABLET BY MOUTH AT BEDTIME FOR CHOLESTEROL 90 tablet 1   blood glucose meter kit and supplies KIT Dispense based on patient and insurance preference. Use up to four times daily as directed. (FOR ICD-9 250.00, 250.01). 1 each 0   blood glucose meter kit and supplies Dispense based on patient and insurance preference. Use up to four times daily as directed. (FOR ICD-10 E10.9, E11.9). 1 each 0   Insulin Glargine-Lixisenatide (SOLIQUA) 100-33 UNT-MCG/ML SOPN Inject 36 units into the skin once daily for diabetes. 15 mL 2   Insulin Pen Needle 32G X 6 MM MISC Use as instructed to inject insulin daily at bedtime 100 each 3   loperamide (IMODIUM A-D) 2 MG tablet Take 2 mg by mouth 4 (four) times daily as needed for diarrhea or loose stools.     losartan (COZAAR) 100 MG tablet TAKE 1 TABLET BY MOUTH ONCE A DAY FOR BLOOD PRESSURE. 90 tablet 2   metFORMIN (GLUCOPHAGE-XR) 500 MG 24 hr tablet TAKE 4 TABLETS BY MOUTH DAILY WITH BREAKFAST FOR DIABETES 360 tablet 0   pantoprazole (PROTONIX) 40 MG tablet      promethazine (PHENERGAN) 25 MG tablet Take 25 mg by mouth every 6 (six) hours as needed. Reported on 09/21/2015  1   venlafaxine XR (EFFEXOR-XR) 37.5 MG 24 hr capsule TAKE 1 CAPSULE BY MOUTH DAILY WITH BREAKFAST. FOR ANXIETY AND DEPRESSION 90 capsule 3   Cholecalciferol (VITAMIN D3) 125 MCG (5000 UT) CAPS Take 5,000 Units by mouth daily. (Patient not taking: Reported  on 05/26/2021)     Magnesium 200 MG TABS Take by mouth daily. (Patient not taking: Reported on 05/26/2021)     Multiple Vitamins-Minerals (  CENTRUM SILVER ULTRA WOMENS PO) Take by mouth daily. (Patient not taking: Reported on 05/26/2021)     oxyCODONE (OXY IR/ROXICODONE) 5 MG immediate release tablet      XTAMPZA ER 13.5 MG C12A  (Patient not taking: No sig reported)     No current facility-administered medications on file prior to visit.    BP 134/72   Pulse 64   Temp 98.2 F (36.8 C) (Temporal)   Ht _0  (1.676 m)   Wt 271 lb (122.9 kg)   SpO2 97%   BMI 43.74 kg/m  Objective:   Physical Exam Cardiovascular:     Rate and Rhythm: Normal rate and regular rhythm.  Pulmonary:     Effort: Pulmonary effort is normal.     Breath sounds: Normal breath sounds.  Musculoskeletal:     Cervical back: Neck supple.  Skin:    General: Skin is warm and dry.          Assessment & Plan:      This visit occurred during the SARS-CoV-2 public health emergency.  Safety protocols were in place, including screening questions prior to the visit, additional usage of staff PPE, and extensive cleaning of exam room while observing appropriate contact time as indicated for disinfecting solutions.

## 2021-05-26 NOTE — Patient Instructions (Addendum)
Good to see you today!  You're A1C was 12 and we need to reduce this number.  Take Soliqua as your injectable medication only and stop taking the Lantus completely.  Schedule a follow up office visit in early October for A1C re-check.  Consistently monitor your blood sugars every day. If they consistently remain above 150 in the mornings, you can increase your dose of Soliqua to 40 units per day.   Good luck with Silver Sneakers and Carb counting!  Call to schedule your mammogram.  Diabetes Mellitus and Nutrition, Adult When you have diabetes, or diabetes mellitus, it is very important to have healthy eating habits because your blood sugar (glucose) levels are greatly affected by what you eat and drink. Eating healthy foods in the right amounts, at about the same times every day, can help you: Control your blood glucose. Lower your risk of heart disease. Improve your blood pressure. Reach or maintain a healthy weight. What can affect my meal plan? Every person with diabetes is different, and each person has different needs for a meal plan. Your health care provider may recommend that you work with a dietitian to make a meal plan that is best for you. Your meal plan may vary depending on factors such as: The calories you need. The medicines you take. Your weight. Your blood glucose, blood pressure, and cholesterol levels. Your activity level. Other health conditions you have, such as heart or kidney disease. How do carbohydrates affect me? Carbohydrates, also called carbs, affect your blood glucose level more than any other type of food. Eating carbs naturally raises the amount of glucose in your blood. Carb counting is a method for keeping track of how many carbs you eat. Counting carbs is important to keep your blood glucose at a healthy level,especially if you use insulin or take certain oral diabetes medicines. It is important to know how many carbs you can safely have in each meal.  This is different for every person. Your dietitian can help you calculate how manycarbs you should have at each meal and for each snack. How does alcohol affect me? Alcohol can cause a sudden decrease in blood glucose (hypoglycemia), especially if you use insulin or take certain oral diabetes medicines. Hypoglycemia can be a life-threatening condition. Symptoms of hypoglycemia, such as sleepiness, dizziness, and confusion, are similar to symptoms of having too much alcohol. Do not drink alcohol if: Your health care provider tells you not to drink. You are pregnant, may be pregnant, or are planning to become pregnant. If you drink alcohol: Do not drink on an empty stomach. Limit how much you use to: 0-1 drink a day for women. 0-2 drinks a day for men. Be aware of how much alcohol is in your drink. In the [place] one drink equals one 12 oz bottle of beer (355 mL), one 5 oz glass of wine (148 mL), or one 1 oz glass of hard liquor (44 mL). Keep yourself hydrated with water, diet soda, or unsweetened iced tea. Keep in mind that regular soda, juice, and other mixers may contain a lot of sugar and must be counted as carbs. What are tips for following this plan?  Reading food labels Start by checking the serving size on the "Nutrition Facts" label of packaged foods and drinks. The amount of calories, carbs, fats, and other nutrients listed on the label is based on one serving of the item. Many items contain more than one serving per package. Check the total grams (g)  of carbs in one serving. You can calculate the number of servings of carbs in one serving by dividing the total carbs by 15. For example, if a food has 30 g of total carbs per serving, it would be equal to 2 servings of carbs. Check the number of grams (g) of saturated fats and trans fats in one serving. Choose foods that have a low amount or none of these fats. Check the number of milligrams (mg) of salt (sodium) in one serving. Most people  should limit total sodium intake to less than 2,300 mg per day. Always check the nutrition information of foods labeled as "low-fat" or "nonfat." These foods may be higher in added sugar or refined carbs and should be avoided. Talk to your dietitian to identify your daily goals for nutrients listed on the label. Shopping Avoid buying canned, pre-made, or processed foods. These foods tend to be high in fat, sodium, and added sugar. Shop around the outside edge of the grocery store. This is where you will most often find fresh fruits and vegetables, bulk grains, fresh meats, and fresh dairy. Cooking Use low-heat cooking methods, such as baking, instead of high-heat cooking methods like deep frying. Cook using healthy oils, such as olive, canola, or sunflower oil. Avoid cooking with butter, cream, or high-fat meats. Meal planning Eat meals and snacks regularly, preferably at the same times every day. Avoid going long periods of time without eating. Eat foods that are high in fiber, such as fresh fruits, vegetables, beans, and whole grains. Talk with your dietitian about how many servings of carbs you can eat at each meal. Eat 4-6 oz (112-168 g) of lean protein each day, such as lean meat, chicken, fish, eggs, or tofu. One ounce (oz) of lean protein is equal to: 1 oz (28 g) of meat, chicken, or fish. 1 egg.  cup (62 g) of tofu. Eat some foods each day that contain healthy fats, such as avocado, nuts, seeds, and fish. What foods should I eat? Fruits Berries. Apples. Oranges. Peaches. Apricots. Plums. Grapes. Mango. Papaya.Pomegranate. Kiwi. Cherries. Vegetables Lettuce. Spinach. Leafy greens, including kale, chard, collard greens, and mustard greens. Beets. Cauliflower. Cabbage. Broccoli. Carrots. Green beans.Tomatoes. Peppers. Onions. Cucumbers. Brussels sprouts. Grains Whole grains, such as whole-wheat or whole-grain bread, crackers, tortillas,cereal, and pasta. Unsweetened oatmeal. Quinoa.  Brown or wild rice. Meats and other proteins Seafood. Poultry without skin. Lean cuts of poultry and beef. Tofu. Nuts. Seeds. Dairy Low-fat or fat-free dairy products such as milk, yogurt, and cheese. The items listed above may not be a complete list of foods and beverages you can eat. Contact a dietitian for more information. What foods should I avoid? Fruits Fruits canned with syrup. Vegetables Canned vegetables. Frozen vegetables with butter or cream sauce. Grains Refined white flour and flour products such as bread, pasta, snack foods, andcereals. Avoid all processed foods. Meats and other proteins Fatty cuts of meat. Poultry with skin. Breaded or fried meats. Processed meat.Avoid saturated fats. Dairy Full-fat yogurt, cheese, or milk. Beverages Sweetened drinks, such as soda or iced tea. The items listed above may not be a complete list of foods and beverages you should avoid. Contact a dietitian for more information. Questions to ask a health care provider Do I need to meet with a diabetes educator? Do I need to meet with a dietitian? What number can I call if I have questions? When are the best times to check my blood glucose? Where to find more information: American Diabetes  Association: diabetes.org Academy of Nutrition and Dietetics: www.eatright.[employer] of Diabetes and Digestive and Kidney Diseases: [employer] Association of Diabetes Care and Education Specialists: www.diabeteseducator.org Summary It is important to have healthy eating habits because your blood sugar (glucose) levels are greatly affected by what you eat and drink. A healthy meal plan will help you control your blood glucose and maintain a healthy lifestyle. Your health care provider may recommend that you work with a dietitian to make a meal plan that is best for you. Keep in mind that carbohydrates (carbs) and alcohol have immediate effects on your blood glucose levels. It is  important to count carbs and to use alcohol carefully. This information is not intended to replace advice given to you by your health care provider. Make sure you discuss any questions you have with your healthcare provider. Document Revised: 08/27/2019 Document Reviewed: 08/27/2019 Elsevier Patient Education  2021 [employer].

## 2021-05-26 NOTE — Progress Notes (Signed)
Established Patient Office Visit  Subjective:  Patient ID: Danielle Edwards, female    DOB: 05/22/1956  Age: 65 y.o. MRN: [id number]  CC:  Chief Complaint  Patient presents with   Diabetes    Home fasting readings have been around 160. Has been using last of lantus for last 2 weeks.     HPI Danielle Edwards presents for diabetes check.  Current medications include: Insulin glargine-Lixisenaide 36 units once daily, metformin XR 500 mg daily. No new side effects from the newly prescribed Soliqua. She had 2 remaining Lantus pens and alternated between the [facility] and the last 2 Lantus pens. She will use Soliqua solely after 8/27.  He/She is checking his/her blood glucose 1 times daily but not consistently and is getting fasting/morning readings of approximately 160 while taking Lantus and 130 while taking Soliqua.   She does not check her feet daily, but gets pedicures regularly.   Last A1C: 12 in July 2022 Last Eye Exam: 02/26/21 Last Foot Exam: due, performed 8/24 Pneumonia Vaccination: due, given in office 8/24 Urine Microalbumin: none, on ARB Statin: Atorvastatin 40 mg tablet daily  Dietary changes since last visit: She is trying to drink more water, she drinks approximately 1/2 gallon of water per day. She is also trying to limit carb intake, her plan is to journal carb intake this week.    Exercise: No new changes with activity level, she cares for her 80 and 78 year old grandchildren regularly. Nothing regimented. She has plans to join [employer] in the near future.   Wt Readings from Last 3 Encounters:  05/26/21 271 lb (122.9 kg)  04/02/21 266 lb (120.7 kg)  12/25/20 266 lb (120.7 kg)     Past Medical History:  Diagnosis Date   Allergy    seasonal allergies   Anemia    hx of IDA   Anxiety    on meds   Arthritis    PRN meds-osteoarthritis   Cataract    sx to remove   Chronic kidney disease    CKD-stage 3   Chronic pain    Depression    on meds    Diabetes type 2, controlled ([place])    on meds   Fibromyalgia    GERD (gastroesophageal reflux disease)    on meds   Headache    History of colonic polyps 1999   Benign   Hx of adenomatous polyp of colon 09/27/2000   2001 - diminutive adenoma [place] 2007 no polyps - [place]   Hyperlipidemia    on meds   Hypertension    on meds   Hypothyroidism    not on meds at this time   IBS (irritable bowel syndrome)    Diarrhea type   Neuromuscular disorder ([place])    Vitamin D deficiency     Past Surgical History:  Procedure Laterality Date   CHOLECYSTECTOMY  1995   COLONOSCOPY  multiple   COLONOSCOPY  2016   TA's   NASAL SINUS SURGERY  2001   POLYPECTOMY     TA's   TONSILLECTOMY AND ADENOIDECTOMY     VAGINAL HYSTERECTOMY     Complete   WISDOM TOOTH EXTRACTION      Family History  Problem Relation Age of Onset   Colon cancer Mother 45   Lung cancer Mother 75   Skin cancer Mother    Colon polyps Mother 55   Heart disease Father    Diabetes Father    Skin cancer  Father    Colon polyps Brother 31   Stomach cancer Paternal Grandmother 49   Breast cancer Neg Hx    Esophageal cancer Neg Hx    Rectal cancer Neg Hx     Social History   Socioeconomic History   Marital status: Married    Spouse name: Not on file   Number of children: Not on file   Years of education: Not on file   Highest education level: Not on file  Occupational History   Not on file  Tobacco Use   Smoking status: Never   Smokeless tobacco: Never  Vaping Use   Vaping Use: Never used  Substance and Sexual Activity   Alcohol use: Yes    Alcohol/week: 0.0 standard drinks    Comment: rarely   Drug use: No   Sexual activity: Not on file  Other Topics Concern   Not on file  Social History Narrative   Married.   3 children.   Retired, worked at [employer] in [place], [place].   Enjoys being with her grandchildren, being with her friends.   Social Determinants of Health   Financial Resource Strain:  Low Risk    Difficulty of Paying Living Expenses: Not hard at all  Food Insecurity: No Food Insecurity   Worried About Charity fundraiser in the Last Year: Never true   [place] in the Last Year: Never true  Transportation Needs: No Transportation Needs   Lack of Transportation (Medical): No   Lack of Transportation (Non-Medical): No  Physical Activity: Inactive   Days of Exercise per Week: 0 days   Minutes of Exercise per Session: 0 min  Stress: No Stress Concern Present   Feeling of Stress : Not at all  Social Connections: Not on file  Intimate Partner Violence: Not At Risk   Fear of Current or Ex-Partner: No   Emotionally Abused: No   Physically Abused: No   Sexually Abused: No    Outpatient Medications Prior to Visit  Medication Sig Dispense Refill   Accu-Chek FastClix Lancets MISC USE AS INSTRUCTED TO CHECK BLOOD SUGAR UP TO 3 TIMES DAILY 306 each 1   amLODipine (NORVASC) 10 MG tablet TAKE 1 TABLET BY MOUTH ONCE A DAY FOR BLOOD PRESSURE. 90 tablet 2   atorvastatin (LIPITOR) 40 MG tablet TAKE ONE TABLET BY MOUTH AT BEDTIME FOR CHOLESTEROL 90 tablet 1   blood glucose meter kit and supplies KIT Dispense based on patient and insurance preference. Use up to four times daily as directed. (FOR ICD-9 250.00, 250.01). 1 each 0   blood glucose meter kit and supplies Dispense based on patient and insurance preference. Use up to four times daily as directed. (FOR ICD-10 E10.9, E11.9). 1 each 0   Insulin Glargine-Lixisenatide (SOLIQUA) 100-33 UNT-MCG/ML SOPN Inject 36 units into the skin once daily for diabetes. 15 mL 2   Insulin Pen Needle 32G X 6 MM MISC Use as instructed to inject insulin daily at bedtime 100 each 3   loperamide (IMODIUM A-D) 2 MG tablet Take 2 mg by mouth 4 (four) times daily as needed for diarrhea or loose stools.     losartan (COZAAR) 100 MG tablet TAKE 1 TABLET BY MOUTH ONCE A DAY FOR BLOOD PRESSURE. 90 tablet 2   metFORMIN (GLUCOPHAGE-XR) 500 MG 24 hr tablet  TAKE 4 TABLETS BY MOUTH DAILY WITH BREAKFAST FOR DIABETES 360 tablet 0   pantoprazole (PROTONIX) 40 MG tablet      promethazine (PHENERGAN) 25  MG tablet Take 25 mg by mouth every 6 (six) hours as needed. Reported on 09/21/2015  1   venlafaxine XR (EFFEXOR-XR) 37.5 MG 24 hr capsule TAKE 1 CAPSULE BY MOUTH DAILY WITH BREAKFAST. FOR ANXIETY AND DEPRESSION 90 capsule 3   Blood Glucose Monitoring Suppl (ACCU-CHEK AVIVA PLUS) w/Device KIT Use as instructed to test blood sugar 3 times daily 1 kit 0   Cholecalciferol (VITAMIN D3) 125 MCG (5000 UT) CAPS Take 5,000 Units by mouth daily. (Patient not taking: Reported on 05/26/2021)     Magnesium 200 MG TABS Take by mouth daily. (Patient not taking: Reported on 05/26/2021)     Multiple Vitamins-Minerals (CENTRUM SILVER ULTRA WOMENS PO) Take by mouth daily. (Patient not taking: Reported on 05/26/2021)     oxyCODONE (OXY IR/ROXICODONE) 5 MG immediate release tablet      XTAMPZA ER 13.5 MG C12A  (Patient not taking: No sig reported)     glucose blood (ACCU-CHEK AVIVA) test strip Use as instructed to test blood sugar up to 3 times daily 300 each 1   No facility-administered medications prior to visit.    No Known Allergies  ROS Review of Systems  Respiratory: Negative.    Cardiovascular: Negative.   Genitourinary:  Positive for enuresis.  Psychiatric/Behavioral: Negative.       Objective:    Physical Exam  BP 134/72   Pulse 64   Temp 98.2 F (36.8 C) (Temporal)   Ht $R'5\' 6"'Sj$  (1.676 m)   Wt 271 lb (122.9 kg)   SpO2 97%   BMI 43.74 kg/m  Wt Readings from Last 3 Encounters:  05/26/21 271 lb (122.9 kg)  04/02/21 266 lb (120.7 kg)  12/25/20 266 lb (120.7 kg)     Health Maintenance Due  Topic Date Due   FOOT EXAM  10/27/2018   PNA vac Low Risk Adult (1 of 2 - PCV13) 04/09/2020   COVID-19 Vaccine (3 - Booster for Janssen series) 11/14/2020   MAMMOGRAM  04/30/2021   INFLUENZA VACCINE  05/03/2021    There are no preventive care reminders to  display for this patient.  Lab Results  Component Value Date   TSH 2.29 06/11/2020   Lab Results  Component Value Date   WBC 7.4 04/02/2021   HGB 10.8 (L) 04/02/2021   HCT 33.3 (L) 04/02/2021   MCV 87.0 04/02/2021   PLT 270.0 04/02/2021   Lab Results  Component Value Date   NA 138 12/25/2020   K 4.8 12/25/2020   CO2 27 12/25/2020   GLUCOSE 302 (H) 12/25/2020   BUN 28 (H) 12/25/2020   CREATININE 1.26 (H) 12/25/2020   BILITOT 0.4 06/11/2020   ALKPHOS 77 06/11/2020   AST 29 06/11/2020   ALT 31 06/11/2020   PROT 7.5 06/11/2020   ALBUMIN 4.0 06/11/2020   CALCIUM 10.0 12/25/2020   GFR 44.74 (L) 12/25/2020   Lab Results  Component Value Date   CHOL 134 12/25/2020   Lab Results  Component Value Date   HDL 49.00 12/25/2020   Lab Results  Component Value Date   LDLCALC 65 12/25/2020   Lab Results  Component Value Date   TRIG 96.0 12/25/2020   Lab Results  Component Value Date   CHOLHDL 3 12/25/2020   Lab Results  Component Value Date   HGBA1C 12.0 (A) 04/02/2021      Assessment & Plan:   Problem List Items Addressed This Visit   None   No orders of the defined types were placed in this  encounter.   Follow-up: No follow-ups on file.    Ninfa Meeker, RN

## 2021-05-27 ENCOUNTER — Telehealth: Payer: Self-pay

## 2021-05-27 ENCOUNTER — Other Ambulatory Visit: Payer: Self-pay | Admitting: Primary Care

## 2021-05-27 NOTE — Chronic Care Management (AMB) (Addendum)
Chronic Care Management Pharmacy Assistant   Name: Danielle Edwards  MRN: [id number] DOB: 1955/12/26  Reason for Encounter:  General Adherence   Recent office visits:  None since last CCM contact  Recent consult visits:  None since last CCM contact  Hospital visits:  None in previous 6 months  Medications: Outpatient Encounter Medications as of 05/27/2021  Medication Sig   Accu-Chek FastClix Lancets MISC USE AS INSTRUCTED TO CHECK BLOOD SUGAR UP TO 3 TIMES DAILY   amLODipine (NORVASC) 10 MG tablet TAKE 1 TABLET BY MOUTH ONCE A DAY FOR BLOOD PRESSURE.   atorvastatin (LIPITOR) 40 MG tablet TAKE ONE TABLET BY MOUTH AT BEDTIME FOR CHOLESTEROL   blood glucose meter kit and supplies KIT Dispense based on patient and insurance preference. Use up to four times daily as directed. (FOR ICD-9 250.00, 250.01).   blood glucose meter kit and supplies Dispense based on patient and insurance preference. Use up to four times daily as directed. (FOR ICD-10 E10.9, E11.9).   Cholecalciferol (VITAMIN D3) 125 MCG (5000 UT) CAPS Take 5,000 Units by mouth daily. (Patient not taking: Reported on 05/26/2021)   Insulin Glargine-Lixisenatide (SOLIQUA) 100-33 UNT-MCG/ML SOPN Inject 36 units into the skin once daily for diabetes.   Insulin Pen Needle 32G X 6 MM MISC Use as instructed to inject insulin daily at bedtime   loperamide (IMODIUM A-D) 2 MG tablet Take 2 mg by mouth 4 (four) times daily as needed for diarrhea or loose stools.   losartan (COZAAR) 100 MG tablet TAKE 1 TABLET BY MOUTH ONCE A DAY FOR BLOOD PRESSURE.   Magnesium 200 MG TABS Take by mouth daily. (Patient not taking: Reported on 05/26/2021)   metFORMIN (GLUCOPHAGE-XR) 500 MG 24 hr tablet TAKE 4 TABLETS BY MOUTH DAILY WITH BREAKFAST FOR DIABETES   Multiple Vitamins-Minerals (CENTRUM SILVER ULTRA WOMENS PO) Take by mouth daily. (Patient not taking: Reported on 05/26/2021)   oxyCODONE (OXY IR/ROXICODONE) 5 MG immediate release tablet    pantoprazole  (PROTONIX) 40 MG tablet    promethazine (PHENERGAN) 25 MG tablet Take 25 mg by mouth every 6 (six) hours as needed. Reported on 09/21/2015   venlafaxine XR (EFFEXOR-XR) 37.5 MG 24 hr capsule TAKE 1 CAPSULE BY MOUTH DAILY WITH BREAKFAST. FOR ANXIETY AND DEPRESSION   XTAMPZA ER 13.5 MG C12A  (Patient not taking: No sig reported)   No facility-administered encounter medications on file as of 05/27/2021.    Attempted contact with Durwin Nora 3 times on 05/27/21,05/28/21,06/01/21. Unsuccessful outreach.  Star Medications: Medication Name/mg  Last Fill Days Supply Losartan 160m   03/22/21 90 Insulin Soliqua 100-33unit/mcg/ml 04/02/21  42 Metformin XR 5011m  03/11/21  90 Atorvastatin 4081m 02/09/21 90  Upcoming appts: PCP appointment on 07/06/21 and CCM appointment on 06/21/21   MicDebbora DusPP notified  VelAvel SensorCMJermynsistant [phone] have reviewed the care management and care coordination activities outlined in this encounter and I am certifying that I agree with the content of this note. No further action required.  MicDebbora DusharmD Clinical Pharmacist [phone]

## 2021-05-27 NOTE — Addendum Note (Signed)
Addended by: Francella Solian on: 05/27/2021 09:27 AM   Modules accepted: Orders

## 2021-06-08 ENCOUNTER — Other Ambulatory Visit: Payer: Self-pay | Admitting: Primary Care

## 2021-06-08 DIAGNOSIS — E119 Type 2 diabetes mellitus without complications: Secondary | ICD-10-CM

## 2021-06-14 ENCOUNTER — Telehealth: Payer: Self-pay

## 2021-06-14 NOTE — Progress Notes (Addendum)
Chronic Care Management Pharmacy Assistant   Name: Sharlize Hoar  MRN: [id number] DOB: 03-11-1956   Reason for Encounter: Appointment Reminder   Medications: Outpatient Encounter Medications as of 06/14/2021  Medication Sig   Accu-Chek FastClix Lancets MISC USE AS INSTRUCTED TO CHECK BLOOD SUGAR UP TO 3 TIMES DAILY   amLODipine (NORVASC) 10 MG tablet TAKE 1 TABLET BY MOUTH ONCE A DAY FOR BLOOD PRESSURE.   atorvastatin (LIPITOR) 40 MG tablet TAKE ONE TABLET BY MOUTH AT BEDTIME FOR CHOLESTEROL   blood glucose meter kit and supplies KIT Dispense based on patient and insurance preference. Use up to four times daily as directed. (FOR ICD-9 250.00, 250.01).   blood glucose meter kit and supplies Dispense based on patient and insurance preference. Use up to four times daily as directed. (FOR ICD-10 E10.9, E11.9).   Cholecalciferol (VITAMIN D3) 125 MCG (5000 UT) CAPS Take 5,000 Units by mouth daily. (Patient not taking: Reported on 05/26/2021)   Insulin Glargine-Lixisenatide (SOLIQUA) 100-33 UNT-MCG/ML SOPN Inject 36 units into the skin once daily for diabetes.   Insulin Pen Needle 32G X 6 MM MISC Use as instructed to inject insulin daily at bedtime   loperamide (IMODIUM A-D) 2 MG tablet Take 2 mg by mouth 4 (four) times daily as needed for diarrhea or loose stools.   losartan (COZAAR) 100 MG tablet TAKE 1 TABLET BY MOUTH ONCE A DAY FOR BLOOD PRESSURE.   Magnesium 200 MG TABS Take by mouth daily. (Patient not taking: Reported on 05/26/2021)   metFORMIN (GLUCOPHAGE-XR) 500 MG 24 hr tablet TAKE 4 TABLETS BY MOUTH DAILY WITH BREAKFAST FOR DIABETES   Multiple Vitamins-Minerals (CENTRUM SILVER ULTRA WOMENS PO) Take by mouth daily. (Patient not taking: Reported on 05/26/2021)   oxyCODONE (OXY IR/ROXICODONE) 5 MG immediate release tablet    pantoprazole (PROTONIX) 40 MG tablet    promethazine (PHENERGAN) 25 MG tablet Take 25 mg by mouth every 6 (six) hours as needed. Reported on 09/21/2015   venlafaxine  XR (EFFEXOR-XR) 37.5 MG 24 hr capsule TAKE 1 CAPSULE BY MOUTH DAILY WITH BREAKFAST. FOR ANXIETY AND DEPRESSION   XTAMPZA ER 13.5 MG C12A  (Patient not taking: No sig reported)   No facility-administered encounter medications on file as of 06/14/2021.   Maytal Mijangos was contacted to remind her of her upcoming telephone visit with Debbora Dus on 06/21/2021 at 1:00 pm. Patient was reminded to have all medications, supplements and any blood glucose and blood pressure readings available for review at appointment.  Are you having any problems with your medications? No  Do you have any concerns you like to discuss with the pharmacist? No  Star Rating Drugs: Medication:    Last Fill: Day Supply Losartan 136m                                 06/08/2021           90 Insulin Soliqua 100-33unit/mcg/ml 04/02/2021           42 Metformin XR 5013m                        06/09/2021           90 Atorvastatin 4039m                            05/27/2021  90 All last fill dates verified with [place] on 06/14/2021   Debbora Dus, CPP notified  Marijean Niemann, [place] Assistant [phone]  I have reviewed the care management and care coordination activities outlined in this encounter and I am certifying that I agree with the content of this note. No further action required.  Debbora Dus, PharmD Clinical Pharmacist [facility] at [facility] [phone]

## 2021-06-21 ENCOUNTER — Ambulatory Visit (INDEPENDENT_AMBULATORY_CARE_PROVIDER_SITE_OTHER): Payer: Medicare HMO

## 2021-06-21 DIAGNOSIS — I10 Essential (primary) hypertension: Secondary | ICD-10-CM

## 2021-06-21 DIAGNOSIS — E785 Hyperlipidemia, unspecified: Secondary | ICD-10-CM

## 2021-06-21 DIAGNOSIS — E119 Type 2 diabetes mellitus without complications: Secondary | ICD-10-CM

## 2021-06-21 NOTE — Progress Notes (Signed)
Chronic Care Management Pharmacy Note  06/21/2021 Name:  Danielle Edwards MRN:  [id number] DOB:  [birth year]  Subjective: Danielle Edwards is an 65 y.o. year old female who is a primary patient of Pleas Koch, NP.  The CCM team was consulted for assistance with disease management and care coordination needs.    Engaged with patient by telephone for follow up visit in response to provider referral for pharmacy case management and/or care coordination services.   Consent to Services:  The patient was given information about Chronic Care Management services, agreed to services, and gave verbal consent prior to initiation of services.  Please see initial visit note for detailed documentation.   Patient Care Team: Pleas Koch, NP as PCP - General (Nurse Practitioner) Debbora Dus, [facility] as Pharmacist (Pharmacist)  Recent office visits: 05/26/21 - PCP - Pt presented for diabetes follow up. If her fasting blood sugars are consistently above 150, to increase the dose of her Soliqua to 40 units per day. Follow up October 2022 for A1c recheck.   Recent consult visits: None in past 6 months  Hospital visits: None in previous 6 months  Objective:  Lab Results  Component Value Date   CREATININE 1.26 (H) 12/25/2020   BUN 28 (H) 12/25/2020   GFR 44.74 (L) 12/25/2020   NA 138 12/25/2020   K 4.8 12/25/2020   CALCIUM 10.0 12/25/2020   CO2 27 12/25/2020   Lab Results  Component Value Date/Time   HGBA1C 12.0 (A) 04/02/2021 11:19 AM   HGBA1C 9.8 (A) 12/25/2020 10:53 AM   HGBA1C 10.9 (H) 06/11/2020 03:14 PM   HGBA1C 8.8 (H) 02/05/2018 10:12 AM   GFR 44.74 (L) 12/25/2020 11:25 AM   GFR 53.15 (L) 06/11/2020 03:14 PM   MICROALBUR 4.4 (H) 08/05/2016 11:14 AM   MICROALBUR 2.0 (H) 04/07/2015 09:15 AM    Last diabetic Eye exam: completed 02/26/21 Last diabetic Foot exam: completed 05/26/21  Lab Results  Component Value Date   CHOL 134 12/25/2020   HDL 49.00 12/25/2020   LDLCALC 65  12/25/2020   LDLDIRECT 112.0 02/05/2018   TRIG 96.0 12/25/2020   CHOLHDL 3 12/25/2020    Hepatic Function Latest Ref Rng & Units 06/11/2020 12/11/2018 01/23/2018  Total Protein 6.0 - 8.3 g/dL 7.5 7.9 7.5  Albumin 3.5 - 5.2 g/dL 4.0 4.2 3.8  AST 0 - 37 U/L _0 ALT 0 - 35 U/L _1 Alk Phosphatase 39 - 117 U/L 77 88 93  Total Bilirubin 0.2 - 1.2 mg/dL 0.4 0.2 0.2  Bilirubin, Direct 0.0 - 0.3 mg/dL - - -    Lab Results  Component Value Date/Time   TSH 2.29 06/11/2020 03:14 PM   TSH 3.05 09/17/2019 03:23 PM    CBC Latest Ref Rng & Units 04/02/2021 12/20/2019 01/23/2018  WBC 4.0 - 10.5 K/uL 7.4 7.3 10.3  Hemoglobin 12.0 - 15.0 g/dL 10.8(L) 11.1(L) 9.7(L)  Hematocrit 36.0 - 46.0 % 33.3(L) 34.1(L) 31.4(L)  Platelets 150.0 - 400.0 K/uL 270.0 253.0 350.0    Lab Results  Component Value Date/Time   VD25OH 54.23 12/25/2020 11:25 AM   VD25OH 37.22 10/30/2017 09:33 AM    Clinical ASCVD: No  The 10-year ASCVD risk score (Arnett DK, et al., 2019) is: 14.6%   Values used to calculate the score:     Age: 74 years     Sex: Female     Is Non-Hispanic African American: No     Diabetic: Yes  Tobacco smoker: No     Systolic Blood Pressure: 470 mmHg     Is BP treated: Yes     HDL Cholesterol: 49 mg/dL     Total Cholesterol: 134 mg/dL    Depression screen [facility] 2/9 02/23/2021 12/08/2020 09/12/2019  Decreased Interest 0 1 0  Down, Depressed, Hopeless 0 0 0  PHQ - 2 Score 0 1 0  Altered sleeping 0 - 0  Tired, decreased energy 0 - 0  Change in appetite 0 - 0  Feeling bad or failure about yourself  0 - 0  Trouble concentrating 0 - 0  Moving slowly or fidgety/restless 0 - 0  Suicidal thoughts 0 - 0  PHQ-9 Score 0 - 0  Difficult doing work/chores Not difficult at all - Not difficult at all     Social History   Tobacco Use  Smoking Status Never  Smokeless Tobacco Never   BP Readings from Last 3 Encounters:  05/26/21 134/72  04/02/21 130/72  12/25/20 120/78   Pulse Readings  from Last 3 Encounters:  05/26/21 64  04/02/21 77  12/25/20 78   Wt Readings from Last 3 Encounters:  05/26/21 271 lb (122.9 kg)  04/02/21 266 lb (120.7 kg)  12/25/20 266 lb (120.7 kg)    Assessment/Interventions: Review of patient past medical history, allergies, medications, health status, including review of consultants reports, laboratory and other test data, was performed as part of comprehensive evaluation and provision of chronic care management services.   SDOH:  (Social Determinants of Health) assessments and interventions performed: Yes    CCM Care Plan  No Known Allergies  Medications Reviewed Today     Reviewed by Pleas Koch, NP (Nurse Practitioner) on 05/26/21 at 1355  Med List Status: <None>   Medication Order Taking? Sig Documenting Provider Last Dose Status Informant  Accu-Chek FastClix Lancets MISC [id number] Yes USE AS INSTRUCTED TO CHECK BLOOD SUGAR UP TO 3 TIMES DAILY Pleas Koch, NP Taking Active   amLODipine (NORVASC) 10 MG tablet [id number] Yes TAKE 1 TABLET BY MOUTH ONCE A DAY FOR BLOOD PRESSURE. Pleas Koch, NP Taking Active   atorvastatin (LIPITOR) 40 MG tablet [id number] Yes TAKE ONE TABLET BY MOUTH AT BEDTIME FOR CHOLESTEROL Pleas Koch, NP Taking Active   blood glucose meter kit and supplies [id number] Yes Dispense based on patient and insurance preference. Use up to four times daily as directed. (FOR ICD-10 E10.9, E11.9). Pleas Koch, NP Taking Active   blood glucose meter kit and supplies KIT [id number] Yes Dispense based on patient and insurance preference. Use up to four times daily as directed. (FOR ICD-9 250.00, 250.01). Pleas Koch, NP Taking Active   Cholecalciferol (VITAMIN D3) 125 MCG (5000 UT) CAPS [id number] No Take 5,000 Units by mouth daily.  Patient not taking: Reported on 05/26/2021   [provider] Not Taking Active   Insulin Glargine-Lixisenatide [facility]) 100-33 UNT-MCG/ML SOPN [id number]  Yes Inject 36 units into the skin once daily for diabetes. Pleas Koch, NP Taking Active   Insulin Pen Needle 32G X 6 MM MISC [id number] Yes Use as instructed to inject insulin daily at bedtime Pleas Koch, NP Taking Active   loperamide (IMODIUM A-D) 2 MG tablet [id number] Yes Take 2 mg by mouth 4 (four) times daily as needed for diarrhea or loose stools. [provider] Taking Active   losartan (COZAAR) 100 MG tablet [id number] Yes TAKE 1 TABLET BY MOUTH ONCE A DAY FOR BLOOD PRESSURE. Alma Friendly  K, NP Taking Active   Magnesium 200 MG TABS [id number] No Take by mouth daily.  Patient not taking: Reported on 05/26/2021   [provider] Not Taking Active Self  metFORMIN (GLUCOPHAGE-XR) 500 MG 24 hr tablet [id number] Yes TAKE 4 TABLETS BY MOUTH DAILY WITH BREAKFAST FOR DIABETES Pleas Koch, NP Taking Active   Multiple Vitamins-Minerals (CENTRUM SILVER ULTRA WOMENS PO) [id number] No Take by mouth daily.  Patient not taking: Reported on 05/26/2021   [provider] Not Taking Active Self  oxyCODONE (OXY IR/ROXICODONE) 5 MG immediate release tablet [id number]   [provider]  Active   pantoprazole (PROTONIX) 40 MG tablet [id number] Yes  [provider] Taking Active   promethazine (PHENERGAN) 25 MG tablet [id number] Yes Take 25 mg by mouth every 6 (six) hours as needed. Reported on 09/21/2015 [provider] Taking Active            Med Note Cristela Felt, [facility]   Fri Aug 05, 2016 10:19 AM)    venlafaxine XR (EFFEXOR-XR) 37.5 MG 24 hr capsule [id number] Yes TAKE 1 CAPSULE BY MOUTH DAILY WITH BREAKFAST. FOR ANXIETY AND DEPRESSION Pleas Koch, NP Taking Active   XTAMPZA ER 13.5 MG C12A [id number] No   Patient not taking: No sig reported   [provider] Not Taking Active             Patient Active Problem List   Diagnosis Date Noted   COVID-19 virus infection 06/02/2020   Hypercalcemia 02/20/2020   Localized  edema 02/20/2020   History of attention deficit disorder 10/18/2019   Fatigue 09/17/2019   CKD (chronic kidney disease) stage 3, GFR 30-59 ml/min (HCC) 12/11/2018   Anemia 02/06/2018   Vitamin D deficiency 08/05/2016   Recurrent sinusitis 10/30/2015   Encounter for annual general medical examination with abnormal findings in adult 04/13/2015   Hyperlipidemia LDL goal <100 04/13/2015   Esophageal reflux 03/23/2015   Family history of colon cancer in mother 02/23/2015   Family history of malignant neoplasm of digestive organs 02/23/2015   Essential hypertension 02/10/2015   DM type 2 (diabetes mellitus, type 2) ([place]) 02/10/2015   Hypothyroidism 02/10/2015   Chronic pain 02/10/2015   Anxiety and depression 02/10/2015   Hx of adenomatous polyp of colon 09/27/2000    Immunization History  Administered Date(s) Administered   DTaP 12/01/2013   Fluad Quad(high Dose 65+) 07/21/2020   Influenza Inj Mdck Quad Pf 11/03/2017   Influenza,inj,Quad PF,6+ Mos 06/29/2015, 05/16/2018, 09/11/2019   Influenza-Unspecified 09/11/2019   Janssen (J&J) SARS-COV-2 Vaccination 12/11/2019   PFIZER(Purple Top)SARS-COV-2 Vaccination 09/19/2020   PNEUMOCOCCAL CONJUGATE-20 05/26/2021   Pneumococcal Polysaccharide-23 04/13/2015   Zoster Recombinat (Shingrix) 05/21/2018, 09/12/2018    Conditions to be addressed/monitored:  Hypertension, Hyperlipidemia, and Diabetes  Care Plan : Clever  Updates made by Debbora Dus, [place] since 07/08/2021 12:00 AM     Problem: CHL AMB "PATIENT-SPECIFIC PROBLEM"      Long-Range Goal: Disease Management   Start Date: 12/08/2020  Priority: High  Note:     Current Barriers:  None identified - blood glucose improving towards goal   Pharmacist Clinical Goal(s):  Patient will achieve adherence to monitoring guidelines and medication adherence to achieve therapeutic efficacy through collaboration with PharmD and provider.   Interventions: 1:1 collaboration  with Pleas Koch, NP regarding development and update of comprehensive plan of care as evidenced by provider attestation and co-signature Inter-disciplinary care team collaboration (see longitudinal plan of care) Comprehensive medication  review performed; medication list updated in electronic medical record  Hypertension (BP goal <140/90) -Controlled - controlled per clinic readings, no updates today, stable  -Current treatment: Losartan 100 mg - 1 tablet daily Amlodipine 40 mg - 1 tablet daily -Medications previously tried: none reported -Current home readings: none reported, not routinely monitoring  -Denies hypotensive/hypertensive symptoms -Recommended to continue current medication  Hyperlipidemia: (LDL goal < 70) -Controlled - LDL 65 03/22, no updates today, stable -Current treatment: Atorvastatin 40 mg - 1 tablet daily -Medications previously tried: none reported -Recommended to continue current medication  Diabetes (A1c goal <7%) -Query controlled - pending updated A1c in October, fasting BG within goal 80-130 -She is eating better, trying to limit carbs. She did have splurge some this weekend while celebrating her sons birthday. Prior to this, her sugars in the morning have been where she wants them. She checks sugars once daily in morning. -Current medications: Insulin glargine-Lixisenaide - 36 units once daily Metformin XR 500 mg - 4 tablets daily -Medications previously tried: glimepiride, Jardiance (vaginal discomfort/yeast infections), Januvia (cost), Trulicity (cost) -Current home glucose readings  Fasting glucose: 122, 112, 104, 110 (from this past week), 116, 93  Post prandial glucose: none  -Denies hypoglycemic/hyperglycemic symptoms. Discussed blood glucose goals including fasting < 130, post prandial < 180. -Diet: She is watching carbs. Eats chickpeas, yogurt, cottage cheese, tuna fish. She has switched coke to coke zero and trying to limit that. She drinks  lots of water. She limits milk. She is taking it one day at a time. -Exercise: She has not starting yet but has looking into the silver sneakers program. [facility]. She also plans to walk more.  -Recommended to continue current medication; Keep up the good work!  Patient Goals/Self-Care Activities Patient will:  - check blood glucose twice daily - continue to take medications as prescribed   Follow Up Plan:  -PCP visit July 06, 2021 -CCM diabetes review in 30 days        Medication Assistance: None required.  Patient affirms current coverage meets needs.  Star Rating Drugs: Medication:                                        Last Fill:         Day Supply Losartan 133m                                 06/08/2021           90 Insulin Soliqua 100-33unit/mcg/ml     04/02/2021           42 Metformin XR 5046m                        06/09/2021           90 Atorvastatin 4040m                            05/27/2021           90 All last fill dates verified with [place] 06/14/2021  Patient's preferred pharmacy is:  [facility] [place]: [phone]  Fax: [phone]  [id number] [place] [address]Phone: [phone]: [phone] [facility] [place] [facility]. Denies any adherence concerns.  Care Plan and Follow Up Patient Decision:  Patient agrees to Care Plan and Follow-up.  MDebbora Dus PharmD Clinical Pharmacist [facility] at [phone]

## 2021-07-02 DIAGNOSIS — I10 Essential (primary) hypertension: Secondary | ICD-10-CM

## 2021-07-02 DIAGNOSIS — E785 Hyperlipidemia, unspecified: Secondary | ICD-10-CM | POA: Diagnosis not present

## 2021-07-02 DIAGNOSIS — E119 Type 2 diabetes mellitus without complications: Secondary | ICD-10-CM | POA: Diagnosis not present

## 2021-07-06 ENCOUNTER — Ambulatory Visit: Payer: Medicare HMO | Admitting: Primary Care

## 2021-07-06 ENCOUNTER — Other Ambulatory Visit: Payer: Self-pay | Admitting: Primary Care

## 2021-07-06 DIAGNOSIS — E119 Type 2 diabetes mellitus without complications: Secondary | ICD-10-CM

## 2021-07-07 NOTE — Telephone Encounter (Signed)
Patient is due for diabetes follow up, needs to be scheduled.   If she cannot come out to [facility] then needs lab appt first at [facility] then virtual with me.   Let me know when this has been done.

## 2021-07-07 NOTE — Telephone Encounter (Signed)
Left message to return call to our office.  

## 2021-07-08 DIAGNOSIS — M5412 Radiculopathy, cervical region: Secondary | ICD-10-CM | POA: Diagnosis not present

## 2021-07-08 DIAGNOSIS — M5416 Radiculopathy, lumbar region: Secondary | ICD-10-CM | POA: Diagnosis not present

## 2021-07-08 DIAGNOSIS — M5382 Other specified dorsopathies, cervical region: Secondary | ICD-10-CM | POA: Diagnosis not present

## 2021-07-08 DIAGNOSIS — M545 Low back pain, unspecified: Secondary | ICD-10-CM | POA: Diagnosis not present

## 2021-07-08 NOTE — Patient Instructions (Addendum)
Dear Danielle Edwards,  Below is a summary of the goals we discussed during our follow up appointment on June 21, 2021. Please contact me anytime with questions or concerns.   Visit Information  Patient Care Plan: CCM Pharmacy Care Plan     Problem Identified: CHL AMB "PATIENT-SPECIFIC PROBLEM"      Long-Range Goal: Disease Management   Start Date: 12/08/2020  Priority: High  Note:     Current Barriers:  None identified - blood glucose improving towards goal   Pharmacist Clinical Goal(s):  Patient will achieve adherence to monitoring guidelines and medication adherence to achieve therapeutic efficacy through collaboration with PharmD and provider.   Interventions: 1:1 collaboration with Pleas Koch, NP regarding development and update of comprehensive plan of care as evidenced by provider attestation and co-signature Inter-disciplinary care team collaboration (see longitudinal plan of care) Comprehensive medication review performed; medication list updated in electronic medical record  Hypertension (BP goal <140/90) -Controlled - controlled per clinic readings, no updates today, stable  -Current treatment: Losartan 100 mg - 1 tablet daily Amlodipine 40 mg - 1 tablet daily -Medications previously tried: none reported -Current home readings: none reported, not routinely monitoring  -Denies hypotensive/hypertensive symptoms -Recommended to continue current medication  Hyperlipidemia: (LDL goal < 70) -Controlled - LDL 65 03/22, no updates today, stable -Current treatment: Atorvastatin 40 mg - 1 tablet daily -Medications previously tried: none reported -Recommended to continue current medication  Diabetes (A1c goal <7%) -Query controlled - pending updated A1c in October, fasting BG within goal 80-130 -She is eating better, trying to limit carbs. She did have splurge some this weekend while celebrating her sons birthday. Prior to this, her sugars in the morning have  been where she wants them. She checks sugars once daily in morning. -Current medications: Insulin glargine-Lixisenaide - 36 units once daily Metformin XR 500 mg - 4 tablets daily -Medications previously tried: glimepiride, Jardiance (vaginal discomfort/yeast infections), Januvia (cost), Trulicity (cost) -Current home glucose readings  Fasting glucose: 122, 112, 104, 110 (from this past week), 116, 93  Post prandial glucose: none  -Denies hypoglycemic/hyperglycemic symptoms. Discussed blood glucose goals including fasting < 130, post prandial < 180. -Diet: She is watching carbs. Eats chickpeas, yogurt, cottage cheese, tuna fish. She has switched coke to coke zero and trying to limit that. She drinks lots of water. She limits milk. She is taking it one day at a time. -Exercise: She has not starting yet but has looking into the silver sneakers program. [facility]. She also plans to walk more.  -Recommended to continue current medication; Keep up the good work!  Patient Goals/Self-Care Activities Patient will:  - check blood glucose twice daily - continue to take medications as prescribed   Follow Up Plan:  -PCP visit July 06, 2021 -CCM diabetes review in 30 days        Patient verbalizes understanding of instructions provided today and agrees to view in [place].   Debbora Dus, PharmD Clinical Pharmacist [facility] at [facility] [phone]

## 2021-07-09 NOTE — Telephone Encounter (Signed)
Left message to return call to our office.  

## 2021-07-12 NOTE — Telephone Encounter (Signed)
Patient has made follow up for 10/14 in office confirmed [place] per message.

## 2021-07-16 ENCOUNTER — Other Ambulatory Visit: Payer: Self-pay

## 2021-07-16 ENCOUNTER — Ambulatory Visit (INDEPENDENT_AMBULATORY_CARE_PROVIDER_SITE_OTHER): Payer: Medicare HMO | Admitting: Primary Care

## 2021-07-16 ENCOUNTER — Encounter: Payer: Self-pay | Admitting: Primary Care

## 2021-07-16 VITALS — BP 136/74 | HR 79 | Temp 97.0°F | Ht 66.0 in | Wt 265.0 lb

## 2021-07-16 DIAGNOSIS — E119 Type 2 diabetes mellitus without complications: Secondary | ICD-10-CM | POA: Diagnosis not present

## 2021-07-16 DIAGNOSIS — Z6841 Body Mass Index (BMI) 40.0 and over, adult: Secondary | ICD-10-CM | POA: Diagnosis not present

## 2021-07-16 DIAGNOSIS — Z23 Encounter for immunization: Secondary | ICD-10-CM

## 2021-07-16 LAB — POCT GLYCOSYLATED HEMOGLOBIN (HGB A1C): Hemoglobin A1C: 10 % — AB (ref 4.0–5.6)

## 2021-07-16 NOTE — Patient Instructions (Signed)
Continue Soliqua 38 units daily for diabetes.  Start checking your blood sugar levels.  Appropriate times to check your blood sugar levels are:  -Before any meal (breakfast, lunch, dinner) -Two hours after any meal (breakfast, lunch, dinner) -Bedtime  Record your readings and send me readings via My Chart in 2 weeks.   It is important that you improve your diet. Please limit carbohydrates in the form of white bread, rice, pasta, sweets, fast food, fried food, sugary drinks, etc. Increase your consumption of fresh fruits and vegetables, whole grains, lean protein.  Ensure you are consuming 64 ounces of water daily.  We will see you in three months.  It was a pleasure to see you today!

## 2021-07-16 NOTE — Assessment & Plan Note (Signed)
Improved but uncontrolled today with A1C of 10.  She is not checking glucose readings, strongly advised she resume as she is on insulin. She agrees to restart. Glucose logs provided.   Continue Soliqua  38 units daily. She will send glucose readings in 2 weeks, at that point we will adjust her insulin/GLP-1.   Foot and eye exam UTD. Managed on statin and ARB.  Follow up in 3 months

## 2021-07-16 NOTE — Progress Notes (Signed)
Subjective:    Patient ID: Danielle Edwards, female    DOB: 09/02/1956, 65 y.o.   MRN: [id number]  HPI  [name] is a very pleasant 65 y.o. female with a history of type 2 diabetes, hypertension, CKD 3, anemia, fatigue, anxiety and depression who presents today for follow up of diabetes.  Current medications include: Metformin XR 2000 mg, Soliqua 38 units daily.   She is checking her blood glucose 0-1 times daily and is getting readings of 90's to low 100's. She stopped checking her blood sugar in to 2-3 weeks. Plans on starting checking.   Last A1C: 12 in July, 10 today.  Last Eye Exam: UTD Last Foot Exam: UTD Pneumonia Vaccination: 2022 Urine Microalbumin: None. Managed on ARB Statin: atorvastatin   Dietary changes since last visit: None. She is trying to reduce her milk intake. She's increased snacking recently.    Exercise: No regular exercise.       Review of Systems  Eyes:  Negative for visual disturbance.  Respiratory:  Negative for shortness of breath.   Cardiovascular:  Negative for chest pain.  Neurological:  Negative for headaches.        Past Medical History:  Diagnosis Date   Allergy    seasonal allergies   Anemia    hx of IDA   Anxiety    on meds   Arthritis    PRN meds-osteoarthritis   Cataract    sx to remove   Chronic kidney disease    CKD-stage 3   Chronic pain    Depression    on meds   Diabetes type 2, controlled ([place])    on meds   Fibromyalgia    GERD (gastroesophageal reflux disease)    on meds   Headache    History of colonic polyps 1999   Benign   Hx of adenomatous polyp of colon 09/27/2000   2001 - diminutive adenoma [place] 2007 no polyps - [place]   Hyperlipidemia    on meds   Hypertension    on meds   Hypothyroidism    not on meds at this time   IBS (irritable bowel syndrome)    Diarrhea type   Neuromuscular disorder (HCC)    Vitamin D deficiency     Social History   Socioeconomic History   Marital  status: Married    Spouse name: Not on file   Number of children: Not on file   Years of education: Not on file   Highest education level: Not on file  Occupational History   Not on file  Tobacco Use   Smoking status: Never   Smokeless tobacco: Never  Vaping Use   Vaping Use: Never used  Substance and Sexual Activity   Alcohol use: Yes    Alcohol/week: 0.0 standard drinks    Comment: rarely   Drug use: No   Sexual activity: Not on file  Other Topics Concern   Not on file  Social History Narrative   Married.   3 children.   Retired, worked at [employer] in [place], [place].   Enjoys being with her grandchildren, being with her friends.   Social Determinants of Health   Financial Resource Strain: Low Risk    Difficulty of Paying Living Expenses: Not hard at all  Food Insecurity: No Food Insecurity   Worried About Charity fundraiser in the Last Year: Never true   Ran Out of Food in the Last Year: Never true  Transportation Needs: No Transportation  Needs   Lack of Transportation (Medical): No   Lack of Transportation (Non-Medical): No  Physical Activity: Inactive   Days of Exercise per Week: 0 days   Minutes of Exercise per Session: 0 min  Stress: No Stress Concern Present   Feeling of Stress : Not at all  Social Connections: Not on file  Intimate Partner Violence: Not At Risk   Fear of Current or Ex-Partner: No   Emotionally Abused: No   Physically Abused: No   Sexually Abused: No    Past Surgical History:  Procedure Laterality Date   CHOLECYSTECTOMY  1995   COLONOSCOPY  multiple   COLONOSCOPY  2016   TA's   NASAL SINUS SURGERY  2001   POLYPECTOMY     TA's   TONSILLECTOMY AND ADENOIDECTOMY     VAGINAL HYSTERECTOMY     Complete   WISDOM TOOTH EXTRACTION      Family History  Problem Relation Age of Onset   Colon cancer Mother 90   Lung cancer Mother 73   Skin cancer Mother    Colon polyps Mother 41   Heart disease Father    Diabetes Father    Skin cancer  Father    Colon polyps Brother 15   Stomach cancer Paternal Grandmother 68   Breast cancer Neg Hx    Esophageal cancer Neg Hx    Rectal cancer Neg Hx     No Known Allergies  Current Outpatient Medications on File Prior to Visit  Medication Sig Dispense Refill   Accu-Chek FastClix Lancets MISC USE AS INSTRUCTED TO CHECK BLOOD SUGAR UP TO 3 TIMES DAILY 306 each 1   amLODipine (NORVASC) 10 MG tablet TAKE 1 TABLET BY MOUTH ONCE A DAY FOR BLOOD PRESSURE. 90 tablet 2   atorvastatin (LIPITOR) 40 MG tablet TAKE ONE TABLET BY MOUTH AT BEDTIME FOR CHOLESTEROL 90 tablet 1   blood glucose meter kit and supplies KIT Dispense based on patient and insurance preference. Use up to four times daily as directed. (FOR ICD-9 250.00, 250.01). 1 each 0   blood glucose meter kit and supplies Dispense based on patient and insurance preference. Use up to four times daily as directed. (FOR ICD-10 E10.9, E11.9). 1 each 0   Cholecalciferol (VITAMIN D3) 125 MCG (5000 UT) CAPS Take 5,000 Units by mouth daily.     Insulin Glargine-Lixisenatide (SOLIQUA) 100-33 UNT-MCG/ML SOPN INJECT 36 UNITS INTO THE SKIN ONCE DAILYFOR DIABETES. Office visit required for further refills. (Patient taking differently: 38 Units. INJECT 36 UNITS INTO THE SKIN ONCE DAILYFOR DIABETES. Office visit required for further refills.) 15 mL 0   Insulin Pen Needle 32G X 6 MM MISC Use as instructed to inject insulin daily at bedtime 100 each 3   loperamide (IMODIUM A-D) 2 MG tablet Take 2 mg by mouth 4 (four) times daily as needed for diarrhea or loose stools.     losartan (COZAAR) 100 MG tablet TAKE 1 TABLET BY MOUTH ONCE A DAY FOR BLOOD PRESSURE. 90 tablet 2   Magnesium 200 MG TABS Take by mouth daily.     metFORMIN (GLUCOPHAGE-XR) 500 MG 24 hr tablet TAKE 4 TABLETS BY MOUTH DAILY WITH BREAKFAST FOR DIABETES 360 tablet 1   Multiple Vitamins-Minerals (CENTRUM SILVER ULTRA WOMENS PO) Take by mouth daily.     oxyCODONE (OXY IR/ROXICODONE) 5 MG immediate  release tablet      pantoprazole (PROTONIX) 40 MG tablet      promethazine (PHENERGAN) 25 MG tablet Take 25 mg by  mouth every 6 (six) hours as needed. Reported on 09/21/2015  1   venlafaxine XR (EFFEXOR-XR) 37.5 MG 24 hr capsule TAKE 1 CAPSULE BY MOUTH DAILY WITH BREAKFAST. FOR ANXIETY AND DEPRESSION 90 capsule 3   XTAMPZA ER 13.5 MG C12A      No current facility-administered medications on file prior to visit.    BP 136/74   Pulse 79   Temp (!) 97 F (36.1 C) (Temporal)   Ht _0  (1.676 m)   Wt 265 lb (120.2 kg)   SpO2 97%   BMI 42.77 kg/m  Objective:   Physical Exam Cardiovascular:     Rate and Rhythm: Normal rate and regular rhythm.  Pulmonary:     Effort: Pulmonary effort is normal.     Breath sounds: Normal breath sounds.  Musculoskeletal:     Cervical back: Neck supple.  Skin:    General: Skin is warm and dry.  Psychiatric:        Mood and Affect: Mood normal.          Assessment & Plan:      This visit occurred during the SARS-CoV-2 public health emergency.  Safety protocols were in place, including screening questions prior to the visit, additional usage of staff PPE, and extensive cleaning of exam room while observing appropriate contact time as indicated for disinfecting solutions.

## 2021-07-26 ENCOUNTER — Telehealth: Payer: Self-pay

## 2021-07-26 NOTE — Chronic Care Management (AMB) (Addendum)
  Chronic Care Management Pharmacy Assistant   Name: Danielle Edwards  MRN: [id number] DOB: 06/04/1956  Reason for Encounter: Diabetes Disease State   Recent office visits:  07/16/21 - PCP Visit - Pt presented for diabetes follow up. Improved but uncontrolled today with A1C of 10. She is not checking glucose readings, strongly advised she resume as she is on insulin. She agrees to restart. Glucose logs provided.  Continue Soliqua  38 units daily. She will send glucose readings in 2 weeks, at that point we will adjust her insulin/GLP-1.   Recent consult visits:  None since last CCM consult  Hospital visits:  None in previous 6 months  Medications: Outpatient Encounter Medications as of 07/26/2021  Medication Sig   Accu-Chek FastClix Lancets MISC USE AS INSTRUCTED TO CHECK BLOOD SUGAR UP TO 3 TIMES DAILY   amLODipine (NORVASC) 10 MG tablet TAKE 1 TABLET BY MOUTH ONCE A DAY FOR BLOOD PRESSURE.   atorvastatin (LIPITOR) 40 MG tablet TAKE ONE TABLET BY MOUTH AT BEDTIME FOR CHOLESTEROL   blood glucose meter kit and supplies KIT Dispense based on patient and insurance preference. Use up to four times daily as directed. (FOR ICD-9 250.00, 250.01).   blood glucose meter kit and supplies Dispense based on patient and insurance preference. Use up to four times daily as directed. (FOR ICD-10 E10.9, E11.9).   Cholecalciferol (VITAMIN D3) 125 MCG (5000 UT) CAPS Take 5,000 Units by mouth daily.   Insulin Glargine-Lixisenatide (SOLIQUA) 100-33 UNT-MCG/ML SOPN INJECT 36 UNITS INTO THE SKIN ONCE DAILYFOR DIABETES. Office visit required for further refills. (Patient taking differently: 38 Units. INJECT 36 UNITS INTO THE SKIN ONCE DAILYFOR DIABETES. Office visit required for further refills.)   Insulin Pen Needle 32G X 6 MM MISC Use as instructed to inject insulin daily at bedtime   loperamide (IMODIUM A-D) 2 MG tablet Take 2 mg by mouth 4 (four) times daily as needed for diarrhea or loose stools.   losartan  (COZAAR) 100 MG tablet TAKE 1 TABLET BY MOUTH ONCE A DAY FOR BLOOD PRESSURE.   Magnesium 200 MG TABS Take by mouth daily.   metFORMIN (GLUCOPHAGE-XR) 500 MG 24 hr tablet TAKE 4 TABLETS BY MOUTH DAILY WITH BREAKFAST FOR DIABETES   Multiple Vitamins-Minerals (CENTRUM SILVER ULTRA WOMENS PO) Take by mouth daily.   oxyCODONE (OXY IR/ROXICODONE) 5 MG immediate release tablet    pantoprazole (PROTONIX) 40 MG tablet    promethazine (PHENERGAN) 25 MG tablet Take 25 mg by mouth every 6 (six) hours as needed. Reported on 09/21/2015   venlafaxine XR (EFFEXOR-XR) 37.5 MG 24 hr capsule TAKE 1 CAPSULE BY MOUTH DAILY WITH BREAKFAST. FOR ANXIETY AND DEPRESSION   XTAMPZA ER 13.5 MG C12A    No facility-administered encounter medications on file as of 07/26/2021.     Recent Relevant Labs: Lab Results  Component Value Date/Time   HGBA1C 10.0 (A) 07/16/2021 11:14 AM   HGBA1C 12.0 (A) 04/02/2021 11:19 AM   HGBA1C 10.9 (H) 06/11/2020 03:14 PM   HGBA1C 8.8 (H) 02/05/2018 10:12 AM   MICROALBUR 4.4 (H) 08/05/2016 11:14 AM   MICROALBUR 2.0 (H) 04/07/2015 09:15 AM    Kidney Function Lab Results  Component Value Date/Time   CREATININE 1.26 (H) 12/25/2020 11:25 AM   CREATININE 1.04 06/11/2020 03:14 PM   GFR 44.74 (L) 12/25/2020 11:25 AM    Attempted contact with Danielle Edwards 3 times on 07/26/21,07/27/21,07/28/21. Unsuccessful outreach. Will attempt contact next month.   Current antihyperglycemic regimen:   Metformin XR 500mg   take 4 tablet daily   Soliqua 38 units daily  What recent interventions/DTPs have been made to improve glycemic control:  Patient will check home BG and send update to PCP in 2 weeks   Adherence Review: Is the patient currently on a STATIN medication? Yes Is the patient currently on ACE/ARB medication? Yes Does the patient have >5 day gap between last estimated fill dates? No  Care Gaps: Annual wellness visit in last year? Yes Most recent A1C reading: 10.0 07/16/21 Most  Recent BP reading: 136/74  79-P  Last eye exam / retinopathy screening: up to date Last diabetic foot exam: up to date  Star Rating Drugs:  Medication:    Last Fill: Day Supply Losartan 176m                                 06/08/2021           90 Insulin Soliqua 100-33unit/mcg/ml     05/29/2021           41 Metformin XR 5041m                        06/09/2021           90 Atorvastatin 4037m                            05/27/2021           90  No appointments scheduled within the next 30 days.  MicDebbora DusPP notified  VelAvel SensorCMBig Bear Lakesistant [phone] have reviewed the care management and care coordination activities outlined in this encounter and I am certifying that I agree with the content of this note. No further action required.  MicDebbora DusharmD Clinical Pharmacist [phone]

## 2021-09-03 ENCOUNTER — Other Ambulatory Visit: Payer: Self-pay | Admitting: Primary Care

## 2021-09-03 DIAGNOSIS — E785 Hyperlipidemia, unspecified: Secondary | ICD-10-CM

## 2021-09-03 DIAGNOSIS — E1169 Type 2 diabetes mellitus with other specified complication: Secondary | ICD-10-CM

## 2021-09-06 DIAGNOSIS — M5412 Radiculopathy, cervical region: Secondary | ICD-10-CM | POA: Diagnosis not present

## 2021-09-06 DIAGNOSIS — M5416 Radiculopathy, lumbar region: Secondary | ICD-10-CM | POA: Diagnosis not present

## 2021-09-06 DIAGNOSIS — M5382 Other specified dorsopathies, cervical region: Secondary | ICD-10-CM | POA: Diagnosis not present

## 2021-09-06 DIAGNOSIS — M545 Low back pain, unspecified: Secondary | ICD-10-CM | POA: Diagnosis not present

## 2021-09-17 ENCOUNTER — Telehealth: Payer: Self-pay | Admitting: Primary Care

## 2021-09-17 ENCOUNTER — Other Ambulatory Visit: Payer: Self-pay | Admitting: Primary Care

## 2021-09-17 DIAGNOSIS — E119 Type 2 diabetes mellitus without complications: Secondary | ICD-10-CM

## 2021-09-17 NOTE — Telephone Encounter (Signed)
[facility] called stated paperwork was fax for proscription of blood glucose meter kit and supplies KIT  # [phone]

## 2021-09-17 NOTE — Telephone Encounter (Signed)
Was on hold for over 45 minutes. Finally got someone said that the dx code was not on the form. IT was. Have refaxed will reach out to me if any issues

## 2021-09-20 ENCOUNTER — Telehealth: Payer: Self-pay | Admitting: Primary Care

## 2021-09-20 DIAGNOSIS — E119 Type 2 diabetes mellitus without complications: Secondary | ICD-10-CM

## 2021-09-20 NOTE — Telephone Encounter (Signed)
Pt called wanting to know if the Insulin Glargine-Lixisenatide (SOLIQUA) 100-33 UNT-MCG/ML SOPN Could be increased to 50 since her BS is running 150 all the time but at nights 170 Couldn't respond in the med refill encounter

## 2021-09-21 NOTE — Telephone Encounter (Signed)
Please call patient:  How much of the [place] Niece is she injecting now?  Also, what are her blood sugars running in the morning, afternoon, and evening?

## 2021-09-22 NOTE — Telephone Encounter (Signed)
She is doing 40U in the morning  Home readings  Morning 18-200 Afternoon not checking  Night not checking   She will do for the next few days and let us know. She wanted to make sure that she should be taking in the AM.

## 2021-09-23 NOTE — Telephone Encounter (Signed)
Noted, will await readings.

## 2021-09-23 NOTE — Telephone Encounter (Signed)
Called patient reviewed all information and repeated back to me. Will call if any questions.   I will call back Friday to get more readings.

## 2021-09-23 NOTE — Telephone Encounter (Signed)
She needs to be checking more frequently in order for me to provide her with dose adjustment recommendations.  Yes, morning is fine for her injection, we may divide the dose for her to take twice daily, but I need more readings.  Call her back Friday for some additional readings.

## 2021-09-28 NOTE — Telephone Encounter (Signed)
For clarification:  She increased her Soliqua to 42 units on 12/23? Not 02/23, right?   If so, then have her divide her Soliqua dose and increase to 25 units in the morning and 25 units in the evening. Can she do this?  Continue metformin (4 tablets daily)  We will see her on 10/19/21 as scheduled.

## 2021-09-28 NOTE — Telephone Encounter (Signed)
Error change was made on 12/23

## 2021-09-28 NOTE — Telephone Encounter (Signed)
Patient is taking 42U around 4 am. She started that on 2/23. She is taking the metformin at 10am. She only had one reading after eating. Encouraged her to check a few more after eating and we will get those when we call back  12/20 Fasting 234  12/21  Fasting 183  12/22 Fasting 260 Fasting 214  12/23 Fasting 222  After eating 225 12/24 Fasting 155  12/26 Fasting 200  Informed patient

## 2021-09-29 MED ORDER — SOLIQUA 100-33 UNT-MCG/ML ~~LOC~~ SOPN: 25.0000 [IU] | PEN_INJECTOR | Freq: Two times a day (BID) | SUBCUTANEOUS | 0 refills | Status: DC

## 2021-09-29 NOTE — Telephone Encounter (Signed)
Called patient reviewed all information and repeated back to me. Will call if any questions.  She will call or send my chart with more readings on Friday.

## 2021-09-29 NOTE — Addendum Note (Signed)
Addended by: Pleas Koch on: 09/29/2021 07:21 PM   Modules accepted: Orders

## 2021-09-29 NOTE — Telephone Encounter (Signed)
Please call pt back.

## 2021-09-29 NOTE — Telephone Encounter (Signed)
Left message to return call to our office.  

## 2021-10-01 ENCOUNTER — Telehealth: Payer: Self-pay | Admitting: Primary Care

## 2021-10-01 NOTE — Telephone Encounter (Signed)
[employer]  [place]  [phone]  [employer] Elenor [place] needs peer to peer  Case: [id number]  Request for CGM requires a peer to peer  We have until Wednesday 1.4.23 at 1pm to accept the request for a peer to peer

## 2021-10-01 NOTE — Telephone Encounter (Signed)
Noted. I will call 10/05/21 upon my return.

## 2021-10-05 NOTE — Telephone Encounter (Signed)
Called and spoke with representative, she took down my information and I was notified that someone would be calling us back with an answer regarding [employer] approval.  Will await call.

## 2021-10-06 NOTE — Telephone Encounter (Signed)
Called has been approved 09/28/21 to 10/02/2021

## 2021-10-06 NOTE — Telephone Encounter (Signed)
Can you call to follow up on this?  Someone was supposed to call be today regarding a decision on approval for [employer].

## 2021-10-07 NOTE — Telephone Encounter (Signed)
Yes I did ask about that she states that we should have received a call but none was ever documented.

## 2021-10-07 NOTE — Telephone Encounter (Signed)
Noted. Well that makes no sense as I did a peer to peer request on 10/05/21.  Did you mention that I called on 10/05/21 and was told we would receive notification on 10/06/21?

## 2021-10-07 NOTE — Telephone Encounter (Signed)
Nope my original dates are correct verified three times with the insurance representative.

## 2021-10-07 NOTE — Telephone Encounter (Signed)
Thanks for checking. I presume you meant 09/28/21 to 10/02/22?  Is patient aware?

## 2021-10-19 ENCOUNTER — Encounter: Payer: Medicare HMO | Admitting: Primary Care

## 2021-10-22 ENCOUNTER — Other Ambulatory Visit: Payer: Self-pay | Admitting: Primary Care

## 2021-10-22 DIAGNOSIS — E119 Type 2 diabetes mellitus without complications: Secondary | ICD-10-CM

## 2021-10-22 NOTE — Telephone Encounter (Signed)
Pt called requesting refill on Soliqua

## 2021-10-25 DIAGNOSIS — E119 Type 2 diabetes mellitus without complications: Secondary | ICD-10-CM | POA: Diagnosis not present

## 2021-11-01 DIAGNOSIS — Z79899 Other long term (current) drug therapy: Secondary | ICD-10-CM | POA: Diagnosis not present

## 2021-11-01 DIAGNOSIS — M797 Fibromyalgia: Secondary | ICD-10-CM | POA: Diagnosis not present

## 2021-11-01 DIAGNOSIS — M5412 Radiculopathy, cervical region: Secondary | ICD-10-CM | POA: Diagnosis not present

## 2021-11-01 DIAGNOSIS — M5416 Radiculopathy, lumbar region: Secondary | ICD-10-CM | POA: Diagnosis not present

## 2021-11-01 DIAGNOSIS — M545 Low back pain, unspecified: Secondary | ICD-10-CM | POA: Diagnosis not present

## 2021-11-01 DIAGNOSIS — M5382 Other specified dorsopathies, cervical region: Secondary | ICD-10-CM | POA: Diagnosis not present

## 2021-11-12 ENCOUNTER — Encounter: Payer: Medicare HMO | Admitting: Primary Care

## 2021-11-16 ENCOUNTER — Other Ambulatory Visit: Payer: Self-pay

## 2021-11-16 ENCOUNTER — Telehealth (INDEPENDENT_AMBULATORY_CARE_PROVIDER_SITE_OTHER): Payer: Medicare HMO | Admitting: Family

## 2021-11-16 ENCOUNTER — Telehealth: Payer: Self-pay | Admitting: Primary Care

## 2021-11-16 ENCOUNTER — Encounter: Payer: Self-pay | Admitting: Family

## 2021-11-16 ENCOUNTER — Other Ambulatory Visit: Payer: Self-pay | Admitting: Family

## 2021-11-16 VITALS — Ht 66.0 in | Wt 255.0 lb

## 2021-11-16 DIAGNOSIS — R051 Acute cough: Secondary | ICD-10-CM | POA: Diagnosis not present

## 2021-11-16 DIAGNOSIS — U071 COVID-19: Secondary | ICD-10-CM

## 2021-11-16 MED ORDER — MOLNUPIRAVIR EUA 200MG CAPSULE: 4.0000 | ORAL_CAPSULE | Freq: Two times a day (BID) | ORAL | 0 refills | Status: AC

## 2021-11-16 MED ORDER — HYDROCODONE BIT-HOMATROP MBR 5-1.5 MG/5ML PO SOLN: 5.0000 mL | Freq: Three times a day (TID) | ORAL | 0 refills | Status: DC | PRN

## 2021-11-16 MED ORDER — HYDROCODONE BIT-HOMATROP MBR 5-1.5 MG/5ML PO SOLN: 5.0000 mL | Freq: Three times a day (TID) | ORAL | 0 refills | Status: AC | PRN

## 2021-11-16 NOTE — Progress Notes (Signed)
MyChart Video Visit    Virtual Visit via Video Note   This visit type was conducted due to national recommendations for restrictions regarding the COVID-19 Pandemic (e.g. social distancing) in an effort to limit this patient's exposure and mitigate transmission in our community. This patient is at least at moderate risk for complications without adequate follow up. This format is felt to be most appropriate for this patient at this time. Physical exam was limited by quality of the video and audio technology used for the visit. CMA was able to get the patient set up on a video visit.  Patient location: Home. Patient and provider in visit Provider location: Office  I discussed the limitations of evaluation and management by telemedicine and the availability of in person appointments. The patient expressed understanding and agreed to proceed.  Visit Date: 11/16/2021  Today's healthcare provider: Eugenia Pancoast, FNP     Subjective:    Patient ID: Danielle Edwards, female    DOB: [birth year], 66 y.o.   MRN: [id number]  Chief Complaint  Patient presents with   Covid Positive    Pt stated--headache, coughing with clear mucus, fever, congestion, body ache, and sore throat -3 days    HPI  Pt here with c/o headache, coughing with clear mucous, fever congestion and body aches. Sx started four days ago. Her family that lives with her is also covid positive.    Tested positive 11/14/21.   Past Medical History:  Diagnosis Date   Allergy    seasonal allergies   Anemia    hx of IDA   Anxiety    on meds   Arthritis    PRN meds-osteoarthritis   Cataract    sx to remove   Chronic kidney disease    CKD-stage 3   Chronic pain    Depression    on meds   Diabetes type 2, controlled ([place])    on meds   Fibromyalgia    GERD (gastroesophageal reflux disease)    on meds   Headache    History of colonic polyps 1999   Benign   Hx of adenomatous polyp of colon 09/27/2000   2001 -  diminutive adenoma [place] 2007 no polyps - [place]   Hyperlipidemia    on meds   Hypertension    on meds   Hypothyroidism    not on meds at this time   IBS (irritable bowel syndrome)    Diarrhea type   Neuromuscular disorder (HCC)    Vitamin D deficiency     Past Surgical History:  Procedure Laterality Date   CHOLECYSTECTOMY  1995   COLONOSCOPY  multiple   COLONOSCOPY  2016   TA's   NASAL SINUS SURGERY  2001   POLYPECTOMY     TA's   TONSILLECTOMY AND ADENOIDECTOMY     VAGINAL HYSTERECTOMY     Complete   WISDOM TOOTH EXTRACTION      Family History  Problem Relation Age of Onset   Colon cancer Mother 44   Lung cancer Mother 51   Skin cancer Mother    Colon polyps Mother 29   Heart disease Father    Diabetes Father    Skin cancer Father    Colon polyps Brother 68   Stomach cancer Paternal Grandmother 7   Breast cancer Neg Hx    Esophageal cancer Neg Hx    Rectal cancer Neg Hx     Social History   Socioeconomic History   Marital status: Married  Spouse name: Not on file   Number of children: Not on file   Years of education: Not on file   Highest education level: Not on file  Occupational History   Not on file  Tobacco Use   Smoking status: Never   Smokeless tobacco: Never  Vaping Use   Vaping Use: Never used  Substance and Sexual Activity   Alcohol use: Yes    Alcohol/week: 0.0 standard drinks    Comment: rarely   Drug use: No   Sexual activity: Not on file  Other Topics Concern   Not on file  Social History Narrative   Married.   3 children.   Retired, worked at [employer] in [place], [place].   Enjoys being with her grandchildren, being with her friends.   Social Determinants of Health   Financial Resource Strain: Low Risk    Difficulty of Paying Living Expenses: Not hard at all  Food Insecurity: No Food Insecurity   Worried About Charity fundraiser in the Last Year: Never true   [place] in the Last Year: Never true   Transportation Needs: No Transportation Needs   Lack of Transportation (Medical): No   Lack of Transportation (Non-Medical): No  Physical Activity: Inactive   Days of Exercise per Week: 0 days   Minutes of Exercise per Session: 0 min  Stress: No Stress Concern Present   Feeling of Stress : Not at all  Social Connections: Not on file  Intimate Partner Violence: Not At Risk   Fear of Current or Ex-Partner: No   Emotionally Abused: No   Physically Abused: No   Sexually Abused: No    Outpatient Medications Prior to Visit  Medication Sig Dispense Refill   Accu-Chek FastClix Lancets MISC USE AS INSTRUCTED TO CHECK BLOOD SUGAR UP TO 3 TIMES DAILY 306 each 1   amLODipine (NORVASC) 10 MG tablet TAKE 1 TABLET BY MOUTH ONCE A DAY FOR BLOOD PRESSURE. 90 tablet 2   atorvastatin (LIPITOR) 40 MG tablet TAKE ONE TABLET BY MOUTH AT BEDTIME FOR CHOLESTEROL 90 tablet 0   blood glucose meter kit and supplies KIT Dispense based on patient and insurance preference. Use up to four times daily as directed. (FOR ICD-9 250.00, 250.01). 1 each 0   blood glucose meter kit and supplies Dispense based on patient and insurance preference. Use up to four times daily as directed. (FOR ICD-10 E10.9, E11.9). 1 each 0   Cholecalciferol (VITAMIN D3) 125 MCG (5000 UT) CAPS Take 5,000 Units by mouth daily.     Insulin Glargine-Lixisenatide (SOLIQUA) 100-33 UNT-MCG/ML SOPN Inject 25 Units into the skin 2 (two) times daily. For diabetes. 15 mL 0   Insulin Pen Needle 32G X 6 MM MISC Use as instructed to inject insulin daily at bedtime 100 each 3   loperamide (IMODIUM A-D) 2 MG tablet Take 2 mg by mouth 4 (four) times daily as needed for diarrhea or loose stools.     losartan (COZAAR) 100 MG tablet TAKE 1 TABLET BY MOUTH ONCE A DAY FOR BLOOD PRESSURE. 90 tablet 2   Magnesium 200 MG TABS Take by mouth daily.     metFORMIN (GLUCOPHAGE-XR) 500 MG 24 hr tablet TAKE 4 TABLETS BY MOUTH DAILY WITH BREAKFAST FOR DIABETES 360 tablet 1    Multiple Vitamins-Minerals (CENTRUM SILVER ULTRA WOMENS PO) Take by mouth daily.     oxyCODONE (OXY IR/ROXICODONE) 5 MG immediate release tablet      pantoprazole (PROTONIX) 40 MG tablet  promethazine (PHENERGAN) 25 MG tablet Take 25 mg by mouth every 6 (six) hours as needed. Reported on 09/21/2015  1   venlafaxine XR (EFFEXOR-XR) 37.5 MG 24 hr capsule TAKE 1 CAPSULE BY MOUTH DAILY WITH BREAKFAST. FOR ANXIETY AND DEPRESSION 90 capsule 3   XTAMPZA ER 13.5 MG C12A      No facility-administered medications prior to visit.    No Known Allergies  Review of Systems  Constitutional:  Positive for chills and fever.  HENT:  Positive for congestion and sore throat. Negative for ear pain.   Respiratory:  Positive for cough. Negative for shortness of breath and wheezing.   Cardiovascular:  Negative for chest pain.  All other systems reviewed and are negative.     Objective:    Physical Exam Constitutional:      General: She is not in acute distress.    Appearance: Normal appearance. She is not ill-appearing.  Pulmonary:     Effort: Pulmonary effort is normal.  Neurological:     General: No focal deficit present.     Mental Status: She is alert and oriented to person, place, and time.  Psychiatric:        Mood and Affect: Mood normal.        Behavior: Behavior normal.        Thought Content: Thought content normal.    Ht 5' 6" (1.676 m)    Wt 255 lb (115.7 kg)    BMI 41.16 kg/m  Wt Readings from Last 3 Encounters:  11/16/21 255 lb (115.7 kg)  07/16/21 265 lb (120.2 kg)  05/26/21 271 lb (122.9 kg)       Assessment & Plan:   Problem List Items Addressed This Visit       Other   Acute cough    rx for cough syrup, pt advised not to take along with pain medication. Pt verbalized understanding. If cough does not improve in the next 2-3 days or symptoms worsen please let me know and we will send antbx.      Relevant Medications   HYDROcodone bit-homatropine (HYCODAN) 5-1.5  MG/5ML syrup   COVID-19 - Primary    Pt considered high risk, sent rx for molnupiravir. Advised of CDC guidelines for self isolation/ ending isolation.  Advised of safe practice guidelines. Symptom Tier reviewed.  Encouraged to monitor for any worsening symptoms; watch for increased shortness of breath, weakness, and signs of dehydration. Advised when to seek emergency care.  Instructed to rest and hydrate well.  Advised to leave the house during recommended isolation period, only if it is necessary to seek medical care       Relevant Medications   molnupiravir EUA (LAGEVRIO) 200 mg CAPS capsule    I am having Danielle Edwards start on HYDROcodone bit-homatropine and molnupiravir EUA. I am also having her maintain her loperamide, promethazine, blood glucose meter kit and supplies, Accu-Chek FastClix Lancets, Vitamin D3, Multiple Vitamins-Minerals (CENTRUM SILVER ULTRA WOMENS PO), Magnesium, Xtampza ER, pantoprazole, blood glucose meter kit and supplies, venlafaxine XR, losartan, amLODipine, Insulin Pen Needle, oxyCODONE, metFORMIN, atorvastatin, and Soliqua.  Meds ordered this encounter  Medications   HYDROcodone bit-homatropine (HYCODAN) 5-1.5 MG/5ML syrup    Sig: Take 5 mLs by mouth every 8 (eight) hours as needed for up to 5 days for cough.    Dispense:  75 mL    Refill:  0    Order Specific Question:   Supervising Provider    Answer:   BEDSOLE, AMY E [[id number]]  molnupiravir EUA (LAGEVRIO) 200 mg CAPS capsule    Sig: Take 4 capsules (800 mg total) by mouth 2 (two) times daily for 5 days.    Dispense:  40 capsule    Refill:  0    Order Specific Question:   Supervising Provider    Answer:   BEDSOLE, AMY E [[id number]]    I discussed the assessment and treatment plan with the patient. The patient was provided an opportunity to ask questions and all were answered. The patient agreed with the plan and demonstrated an understanding of the instructions.   The patient was advised to call back or seek  an in-person evaluation if the symptoms worsen or if the condition fails to improve as anticipated.  I provided 18 minutes of face-to-face time during this encounter.   Eugenia Pancoast, [place] [facility] [place] [phone] (phone) [phone] (fax)  [place]

## 2021-11-16 NOTE — Telephone Encounter (Signed)
Pt called she needs the HYDROcodone bit-homatropine (HYCODAN) 5-1.5 MG/5ML syrup [facility]

## 2021-11-16 NOTE — Assessment & Plan Note (Signed)
rx for cough syrup, pt advised not to take along with pain medication. Pt verbalized understanding. If cough does not improve in the next 2-3 days or symptoms worsen please let me know and we will send antbx.

## 2021-11-16 NOTE — Patient Instructions (Signed)
Your COVID19 PCR test is POSITIVE.  Let us know right away if any worsening shortness of breath or persistent productive cough or fever.   Follow these current CDC guidelines for self-isolation:  - Stay home for 5 days, starting the day after your symptoms (The first day is really day 0). - If you have no symptoms or your symptoms are resolving after 5 days, you can leave your house. - Continue to wear a mask around others for 5 additional days. **If you have a fever, continue to stay home until your fever resolves for 24 hours without fever-reducing medications.**

## 2021-11-16 NOTE — Telephone Encounter (Signed)
error 

## 2021-11-16 NOTE — Telephone Encounter (Signed)
Pt stated [facility] is out  for the cough medication and pt [facility]. Please re-sent  [facility]rop[facility]

## 2021-11-16 NOTE — Assessment & Plan Note (Signed)
Pt considered high risk, sent rx for molnupiravir. Advised of CDC guidelines for self isolation/ ending isolation.  Advised of safe practice guidelines. Symptom Tier reviewed.  Encouraged to monitor for any worsening symptoms; watch for increased shortness of breath, weakness, and signs of dehydration. Advised when to seek emergency care.  Instructed to rest and hydrate well.  Advised to leave the house during recommended isolation period, only if it is necessary to seek medical care

## 2021-11-16 NOTE — Telephone Encounter (Signed)
Done

## 2021-11-19 ENCOUNTER — Telehealth: Payer: Self-pay

## 2021-11-19 NOTE — Telephone Encounter (Signed)
Noted  

## 2021-11-19 NOTE — Telephone Encounter (Signed)
I spoke with pt; pt said after showering today she had to lay down due to being tired and pt got upset about feeling so fatigued. Pt said she is urinating but is not urinating as much as she did with the quantity of urine coming out is less. Pt has been trying to drink water but not drinking as much as usual. Pt has had dry mouth, pt has had lightheadedness earlier today, pt felt weak and had to lay down. No abd pain. No vomiting but pt has had watery diarrhea on 11/17/21, 11/18/21 and some diarrhea this morning. Pt said she has been trying to drink more today and is feeling little better. ED precautions given and pt voiced understanding. Pt said she will see how she does and decide what to do. Pt said she will cb on Mon and giving update on if had to go to ED or how pt is feeling. Sending note to Gentry Fitz NP and [facility] CMA. Sending teams to [place] also.

## 2021-11-19 NOTE — Telephone Encounter (Signed)
[facility] - Client TELEPHONE ADVICE RECORD [facility] Patient Name: Danielle Edwards Gender: Female DOB: [birth year] Age: 66 Y 50 M 9 D Return Phone Number: [phone] (Primary) Address: City/ State/ Zip: [place] [address] Client [facility] - Client Client Site [place] - Day Provider Alma Friendly - NP Contact Type Call Who Is Calling Patient / Member / Family / Caregiver Call Type Triage / Clinical Relationship To Patient Self Return Phone Number [phone] (Primary) Chief Complaint BREATHING - shortness of breath or sounds breathless Reason for Call Symptomatic / Request for [place] states she has Covid and is feeling dehydrated, fatigued and has shortness of breath. Translation No Nurse Assessment Nurse: Thad Ranger, RN, Denise Date/Time (Eastern Time): 11/19/2021 12:48:10 PM Confirm and document reason for call. If symptomatic, describe symptoms. ---Caller states she has Covid and is feeling dehydrated, fatigued and has shortness of breath. Tested positive on Sunday and placed on antiviral med on Wed. Does the patient have any new or worsening symptoms? ---Yes Will a triage be completed? ---Yes Related visit to physician within the last 2 weeks? ---Yes Does the PT have any chronic conditions? (i.e. diabetes, asthma, this includes High risk factors for pregnancy, etc.) ---Yes List chronic conditions. ---Diabetes Is this a behavioral health or substance abuse call? ---No Guidelines Guideline Title Affirmed Question Affirmed Notes Nurse Date/Time ([place] Time) COVID-19 - Diagnosed or Suspected MILD difficulty breathing (e.g., minimal/no SOB at rest, SOB with walking, pulse <100) Carmon, RN, Denise 11/19/2021 12:51:07 PM Disp. Time Eilene Ghazi Time) Disposition Final User 11/19/2021 12:46:31 PM Send to Urgent Kerrin Champagne, Leighton Roach PLEASE  NOTE: All timestamps contained within this report are represented as [place] Standard Time. CONFIDENTIALTY NOTICE: This fax transmission is intended only for the addressee. It contains information that is legally privileged, confidential or otherwise protected from use or disclosure. If you are not the intended recipient, you are strictly prohibited from reviewing, disclosing, copying using or disseminating any of this information or taking any action in reliance on or regarding this information. If you have received this fax in error, please notify us immediately by telephone so that we can arrange for its return to [place]. Phone: [phone], Toll-Free: [phone], Fax: [phone] Page: 2 of 2 Call Id: [id number] 11/19/2021 12:57:29 PM See HCP within 4 Hours (or PCP triage) Yes Carmon, RN, Yevette Edwards Disagree/Comply Comply Caller Understands Yes PreDisposition Call Doctor Care Advice Given Per Guideline SEE HCP (OR PCP TRIAGE) WITHIN 4 HOURS: GENERAL CARE ADVICE FOR COVID-19 SYMPTOMS: * The symptoms are generally treated the same whether you have COVID-19, influenza or some other respiratory virus. * Cough: Use cough drops. * Feeling dehydrated: Drink extra liquids. If the air in your home is dry, use a humidifier. * Muscle aches, headache, and other pains: Often this comes and goes with the fever. Take acetaminophen every 4 to 6 hours (Adults 650 mg) OR ibuprofen every 6 to 8 hours (Adults 400 mg). Before taking any medicine, read all the instructions on the package. CALL BACK IF: * You become worse CARE ADVICE given per COVID-19 - DIAGNOSED OR SUSPECTED (Adult) guideline. Comments User: Romeo Apple, RN Date/Time Eilene Ghazi Time): 11/19/2021 12:57:28 PM Called [facility] for appt and not appt available. Referrals GO TO FACILITY UNDECIDE

## 2021-11-25 ENCOUNTER — Ambulatory Visit (INDEPENDENT_AMBULATORY_CARE_PROVIDER_SITE_OTHER): Payer: Medicare HMO | Admitting: Primary Care

## 2021-11-25 ENCOUNTER — Other Ambulatory Visit: Payer: Self-pay

## 2021-11-25 ENCOUNTER — Encounter: Payer: Self-pay | Admitting: Primary Care

## 2021-11-25 ENCOUNTER — Other Ambulatory Visit: Payer: Self-pay | Admitting: Primary Care

## 2021-11-25 VITALS — BP 140/76 | HR 96 | Temp 98.6°F | Ht 66.0 in | Wt 258.0 lb

## 2021-11-25 DIAGNOSIS — D649 Anemia, unspecified: Secondary | ICD-10-CM | POA: Diagnosis not present

## 2021-11-25 DIAGNOSIS — G894 Chronic pain syndrome: Secondary | ICD-10-CM

## 2021-11-25 DIAGNOSIS — E1165 Type 2 diabetes mellitus with hyperglycemia: Secondary | ICD-10-CM | POA: Diagnosis not present

## 2021-11-25 DIAGNOSIS — E2839 Other primary ovarian failure: Secondary | ICD-10-CM | POA: Diagnosis not present

## 2021-11-25 DIAGNOSIS — Z794 Long term (current) use of insulin: Secondary | ICD-10-CM

## 2021-11-25 DIAGNOSIS — E1169 Type 2 diabetes mellitus with other specified complication: Secondary | ICD-10-CM

## 2021-11-25 DIAGNOSIS — E559 Vitamin D deficiency, unspecified: Secondary | ICD-10-CM

## 2021-11-25 DIAGNOSIS — I10 Essential (primary) hypertension: Secondary | ICD-10-CM

## 2021-11-25 DIAGNOSIS — E039 Hypothyroidism, unspecified: Secondary | ICD-10-CM | POA: Diagnosis not present

## 2021-11-25 DIAGNOSIS — E119 Type 2 diabetes mellitus without complications: Secondary | ICD-10-CM | POA: Diagnosis not present

## 2021-11-25 DIAGNOSIS — E785 Hyperlipidemia, unspecified: Secondary | ICD-10-CM | POA: Diagnosis not present

## 2021-11-25 DIAGNOSIS — F419 Anxiety disorder, unspecified: Secondary | ICD-10-CM | POA: Diagnosis not present

## 2021-11-25 DIAGNOSIS — F32A Depression, unspecified: Secondary | ICD-10-CM

## 2021-11-25 DIAGNOSIS — N183 Chronic kidney disease, stage 3 unspecified: Secondary | ICD-10-CM

## 2021-11-25 DIAGNOSIS — Z0000 Encounter for general adult medical examination without abnormal findings: Secondary | ICD-10-CM

## 2021-11-25 DIAGNOSIS — E875 Hyperkalemia: Secondary | ICD-10-CM

## 2021-11-25 DIAGNOSIS — K219 Gastro-esophageal reflux disease without esophagitis: Secondary | ICD-10-CM

## 2021-11-25 LAB — IBC + FERRITIN
Ferritin: 13.7 ng/mL (ref 10.0–291.0)
Iron: 41 ug/dL — ABNORMAL LOW (ref 42–145)
Saturation Ratios: 9.7 % — ABNORMAL LOW (ref 20.0–50.0)
TIBC: 424.2 ug/dL (ref 250.0–450.0)
Transferrin: 303 mg/dL (ref 212.0–360.0)

## 2021-11-25 LAB — CBC
HCT: 35.2 % — ABNORMAL LOW (ref 36.0–46.0)
Hemoglobin: 11.3 g/dL — ABNORMAL LOW (ref 12.0–15.0)
MCHC: 32 g/dL (ref 30.0–36.0)
MCV: 86.4 fl (ref 78.0–100.0)
Platelets: 294 10*3/uL (ref 150.0–400.0)
RBC: 4.08 Mil/uL (ref 3.87–5.11)
RDW: 14.2 % (ref 11.5–15.5)
WBC: 7.4 10*3/uL (ref 4.0–10.5)

## 2021-11-25 LAB — LIPID PANEL
Cholesterol: 104 mg/dL (ref 0–200)
HDL: 45.1 mg/dL (ref >39.00)
LDL Cholesterol: 44 mg/dL (ref 0–99)
NonHDL: 58.89
Total CHOL/HDL Ratio: 2
Triglycerides: 76 mg/dL (ref 0.0–149.0)
VLDL: 15.2 mg/dL (ref 0.0–40.0)

## 2021-11-25 LAB — COMPREHENSIVE METABOLIC PANEL
ALT: 15 U/L (ref 0–35)
AST: 14 U/L (ref 0–37)
Albumin: 4.2 g/dL (ref 3.5–5.2)
Alkaline Phosphatase: 69 U/L (ref 39–117)
BUN: 35 mg/dL — ABNORMAL HIGH (ref 6–23)
CO2: 28 mEq/L (ref 19–32)
Calcium: 10 mg/dL (ref 8.4–10.5)
Chloride: 107 mEq/L (ref 96–112)
Creatinine, Ser: 1.33 mg/dL — ABNORMAL HIGH (ref 0.40–1.20)
GFR: 41.66 mL/min — ABNORMAL LOW (ref >60.00)
Glucose, Bld: 136 mg/dL — ABNORMAL HIGH (ref 70–99)
Potassium: 5.2 mEq/L — ABNORMAL HIGH (ref 3.5–5.1)
Sodium: 141 mEq/L (ref 135–145)
Total Bilirubin: 0.3 mg/dL (ref 0.2–1.2)
Total Protein: 7.5 g/dL (ref 6.0–8.3)

## 2021-11-25 LAB — HEMOGLOBIN A1C: Hgb A1c MFr Bld: 9.4 % — ABNORMAL HIGH (ref 4.6–6.5)

## 2021-11-25 LAB — TSH: TSH: 2.84 u[IU]/mL (ref 0.35–5.50)

## 2021-11-25 LAB — MAGNESIUM: Magnesium: 1.4 mg/dL — ABNORMAL LOW (ref 1.5–2.5)

## 2021-11-25 LAB — VITAMIN D 25 HYDROXY (VIT D DEFICIENCY, FRACTURES): VITD: 62.85 ng/mL (ref 30.00–100.00)

## 2021-11-25 MED ORDER — ATORVASTATIN CALCIUM 40 MG PO TABS: ORAL_TABLET | ORAL | 3 refills | Status: DC

## 2021-11-25 MED ORDER — METFORMIN HCL ER 500 MG PO TB24: 2000.0000 mg | ORAL_TABLET | Freq: Every day | ORAL | 1 refills | Status: DC

## 2021-11-25 MED ORDER — AMLODIPINE BESYLATE 10 MG PO TABS: 10.0000 mg | ORAL_TABLET | Freq: Every day | ORAL | 3 refills | Status: DC

## 2021-11-25 MED ORDER — DULOXETINE HCL 30 MG PO CPEP: 30.0000 mg | ORAL_CAPSULE | Freq: Every day | ORAL | 0 refills | Status: DC

## 2021-11-25 MED ORDER — LOSARTAN POTASSIUM 100 MG PO TABS: 100.0000 mg | ORAL_TABLET | Freq: Every day | ORAL | 3 refills | Status: DC

## 2021-11-25 NOTE — Assessment & Plan Note (Signed)
Glucose readings over the last 2 weeks appear much better! Commended her on pursuing the freestyle libre.  Continue Soliqua 25 units twice daily, metformin XR 2000 mg daily.  Managed on statin and ARB. Eye exam up-to-date. Foot exam up-to-date.  Repeat A1c pending. Follow-up in 3 to 6 months depending on A1c results.

## 2021-11-25 NOTE — Assessment & Plan Note (Signed)
Controlled.  Continue pantoprazole 40 mg daily. 

## 2021-11-25 NOTE — Assessment & Plan Note (Signed)
Following with pain management.  Continue oxycodone IR 5 mg daily.  We will be changing venlafaxine to Cymbalta to help with anxiety, depression, pain.

## 2021-11-25 NOTE — Assessment & Plan Note (Signed)
Repeat TSH pending.  Remain off of medications.

## 2021-11-25 NOTE — Progress Notes (Signed)
Subjective:    Patient ID: Danielle Edwards, female    DOB: 09-09-1956, 66 y.o.   MRN: [id number]  HPI  Danielle Edwards is a very pleasant 66 y.o. female who presents today for complete physical and follow up of chronic conditions.  She is checking her blood sugars continuously with FreeStyle Libre for the last 2 weeks which are running 90's-120's for the most part. She is compliant to [facility] 25 units BID, metformin XR 2000 mg daily  She was once managed on Cymbalta for fibromyalgia, anxiety, and depression, thinks she did well.  She stopped her venlafaxine months ago, is thinking about resuming it.  Immunizations: -Influenza: Completed the season -Covid-19: J&J x 1, 1 Pfizer vaccine -Shingles: Completed Shingrix -Pneumonia: Prevnar 20 in 2022 Pneumovax in 2016,   Diet: Fair diet, has improved.  Exercise: No regular exercise.  Eye exam: Completes annually  Dental exam: Completes semi-annually   Mammogram: Completed in 2020, ordered August 2022 Dexa: Completed in 2016 Colonoscopy: Completed in 2021, due 2024  BP Readings from Last 3 Encounters:  11/25/21 140/76  07/16/21 136/74  05/26/21 134/72         Review of Systems  Constitutional:  Negative for unexpected weight change.  HENT:  Negative for rhinorrhea.   Eyes:  Negative for visual disturbance.  Respiratory:  Negative for cough and shortness of breath.   Cardiovascular:  Negative for chest pain.  Gastrointestinal:  Negative for constipation and diarrhea.  Genitourinary:  Negative for difficulty urinating and menstrual problem.  Musculoskeletal:  Positive for arthralgias and myalgias.  Skin:  Negative for rash.  Allergic/Immunologic: Negative for environmental allergies.  Neurological:  Negative for dizziness, numbness and headaches.  Psychiatric/Behavioral:  The patient is nervous/anxious.         Past Medical History:  Diagnosis Date   Allergy    seasonal allergies   Anemia    hx of IDA   Anxiety     on meds   Arthritis    PRN meds-osteoarthritis   Cataract    sx to remove   Chronic kidney disease    CKD-stage 3   Chronic pain    Depression    on meds   Diabetes type 2, controlled ([place])    on meds   Fibromyalgia    GERD (gastroesophageal reflux disease)    on meds   Headache    History of colonic polyps 1999   Benign   Hx of adenomatous polyp of colon 09/27/2000   2001 - diminutive adenoma [place] 2007 no polyps - [place]   Hyperlipidemia    on meds   Hypertension    on meds   Hypothyroidism    not on meds at this time   IBS (irritable bowel syndrome)    Diarrhea type   Neuromuscular disorder (HCC)    Vitamin D deficiency     Social History   Socioeconomic History   Marital status: Married    Spouse name: Not on file   Number of children: Not on file   Years of education: Not on file   Highest education level: Not on file  Occupational History   Not on file  Tobacco Use   Smoking status: Never   Smokeless tobacco: Never  Vaping Use   Vaping Use: Never used  Substance and Sexual Activity   Alcohol use: Yes    Alcohol/week: 0.0 standard drinks    Comment: rarely   Drug use: No   Sexual activity: Not on file  Other Topics Concern   Not on file  Social History Narrative   Married.   3 children.   Retired, worked at [employer] in [place], [place].   Enjoys being with her grandchildren, being with her friends.   Social Determinants of Health   Financial Resource Strain: Low Risk    Difficulty of Paying Living Expenses: Not hard at all  Food Insecurity: No Food Insecurity   Worried About Charity fundraiser in the Last Year: Never true   [place] in the Last Year: Never true  Transportation Needs: No Transportation Needs   Lack of Transportation (Medical): No   Lack of Transportation (Non-Medical): No  Physical Activity: Inactive   Days of Exercise per Week: 0 days   Minutes of Exercise per Session: 0 min  Stress: No Stress Concern Present    Feeling of Stress : Not at all  Social Connections: Not on file  Intimate Partner Violence: Not At Risk   Fear of Current or Ex-Partner: No   Emotionally Abused: No   Physically Abused: No   Sexually Abused: No    Past Surgical History:  Procedure Laterality Date   CHOLECYSTECTOMY  1995   COLONOSCOPY  multiple   COLONOSCOPY  2016   TA's   NASAL SINUS SURGERY  2001   POLYPECTOMY     TA's   TONSILLECTOMY AND ADENOIDECTOMY     VAGINAL HYSTERECTOMY     Complete   WISDOM TOOTH EXTRACTION      Family History  Problem Relation Age of Onset   Colon cancer Mother 17   Lung cancer Mother 87   Skin cancer Mother    Colon polyps Mother 71   Heart disease Father    Diabetes Father    Skin cancer Father    Colon polyps Brother 37   Stomach cancer Paternal Grandmother 57   Breast cancer Neg Hx    Esophageal cancer Neg Hx    Rectal cancer Neg Hx     No Known Allergies  Current Outpatient Medications on File Prior to Visit  Medication Sig Dispense Refill   Accu-Chek FastClix Lancets MISC USE AS INSTRUCTED TO CHECK BLOOD SUGAR UP TO 3 TIMES DAILY 306 each 1   amLODipine (NORVASC) 10 MG tablet TAKE 1 TABLET BY MOUTH ONCE A DAY FOR BLOOD PRESSURE. 90 tablet 2   atorvastatin (LIPITOR) 40 MG tablet TAKE ONE TABLET BY MOUTH AT BEDTIME FOR CHOLESTEROL 90 tablet 0   blood glucose meter kit and supplies KIT Dispense based on patient and insurance preference. Use up to four times daily as directed. (FOR ICD-9 250.00, 250.01). 1 each 0   blood glucose meter kit and supplies Dispense based on patient and insurance preference. Use up to four times daily as directed. (FOR ICD-10 E10.9, E11.9). 1 each 0   Cholecalciferol (VITAMIN D3) 125 MCG (5000 UT) CAPS Take 5,000 Units by mouth daily.     Insulin Glargine-Lixisenatide (SOLIQUA) 100-33 UNT-MCG/ML SOPN Inject 25 Units into the skin 2 (two) times daily. For diabetes. 15 mL 0   Insulin Pen Needle 32G X 6 MM MISC Use as instructed to inject insulin  daily at bedtime 100 each 3   loperamide (IMODIUM A-D) 2 MG tablet Take 2 mg by mouth 4 (four) times daily as needed for diarrhea or loose stools.     losartan (COZAAR) 100 MG tablet TAKE 1 TABLET BY MOUTH ONCE A DAY FOR BLOOD PRESSURE. 90 tablet 2   metFORMIN (GLUCOPHAGE-XR) 500  MG 24 hr tablet TAKE 4 TABLETS BY MOUTH DAILY WITH BREAKFAST FOR DIABETES 360 tablet 1   Multiple Vitamins-Minerals (CENTRUM SILVER ULTRA WOMENS PO) Take by mouth daily.     oxyCODONE (OXY IR/ROXICODONE) 5 MG immediate release tablet      pantoprazole (PROTONIX) 40 MG tablet      promethazine (PHENERGAN) 25 MG tablet Take 25 mg by mouth every 6 (six) hours as needed. Reported on 09/21/2015  1   Magnesium 200 MG TABS Take by mouth daily. (Patient not taking: Reported on 11/25/2021)     venlafaxine XR (EFFEXOR-XR) 37.5 MG 24 hr capsule TAKE 1 CAPSULE BY MOUTH DAILY WITH BREAKFAST. FOR ANXIETY AND DEPRESSION (Patient not taking: Reported on 11/25/2021) 90 capsule 3   XTAMPZA ER 13.5 MG C12A  (Patient not taking: Reported on 11/25/2021)     No current facility-administered medications on file prior to visit.    BP 140/76    Pulse 96    Temp 98.6 F (37 C) (Oral)    Ht 5' 6"  (1.676 m)    Wt 258 lb (117 kg)    SpO2 97%    BMI 41.64 kg/m  Objective:   Physical Exam HENT:     Right Ear: Tympanic membrane and ear canal normal.     Left Ear: Tympanic membrane and ear canal normal.     Nose: Nose normal.  Eyes:     Conjunctiva/sclera: Conjunctivae normal.     Pupils: Pupils are equal, round, and reactive to light.  Neck:     Thyroid: No thyromegaly.  Cardiovascular:     Rate and Rhythm: Normal rate and regular rhythm.     Heart sounds: No murmur heard. Pulmonary:     Effort: Pulmonary effort is normal.     Breath sounds: Normal breath sounds. No rales.  Abdominal:     General: Bowel sounds are normal.     Palpations: Abdomen is soft.     Tenderness: There is no abdominal tenderness.  Musculoskeletal:         General: Normal range of motion.     Cervical back: Neck supple.  Lymphadenopathy:     Cervical: No cervical adenopathy.  Skin:    General: Skin is warm and dry.     Findings: No rash.  Neurological:     Mental Status: She is alert and oriented to person, place, and time.     Cranial Nerves: No cranial nerve deficit.     Deep Tendon Reflexes: Reflexes are normal and symmetric.  Psychiatric:        Mood and Affect: Mood normal.          Assessment & Plan:      This visit occurred during the SARS-CoV-2 public health emergency.  Safety protocols were in place, including screening questions prior to the visit, additional usage of staff PPE, and extensive cleaning of exam room while observing appropriate contact time as indicated for disinfecting solutions.

## 2021-11-25 NOTE — Assessment & Plan Note (Signed)
Repeat vitamin D level pending. Continue vitamin D 5000 IUs daily.

## 2021-11-25 NOTE — Assessment & Plan Note (Signed)
Continue atorvastatin 40 mg. Lipid panel pending.

## 2021-11-25 NOTE — Patient Instructions (Signed)
Stop by the lab prior to leaving today. I will notify you of your results once received.   Start duloxetine (Cymbalta) 30 mg once daily for anxiety and depression.  Complete your bone density scan and mammogram, call to schedule.  Keep up the great work your diet, keep checking your blood sugars!  It was a pleasure to see you today!  Preventive Care 67 Years and Older, Female Preventive care refers to lifestyle choices and visits with your health care provider that can promote health and wellness. Preventive care visits are also called wellness exams. What can I expect for my preventive care visit? Counseling Your health care provider may ask you questions about your: Medical history, including: Past medical problems. Family medical history. Pregnancy and menstrual history. History of falls. Current health, including: Memory and ability to understand (cognition). Emotional well-being. Home life and relationship well-being. Sexual activity and sexual health. Lifestyle, including: Alcohol, nicotine or tobacco, and drug use. Access to firearms. Diet, exercise, and sleep habits. Work and work Statistician. Sunscreen use. Safety issues such as seatbelt and bike helmet use. Physical exam Your health care provider will check your: Height and weight. These may be used to calculate your BMI (body mass index). BMI is a measurement that tells if you are at a healthy weight. Waist circumference. This measures the distance around your waistline. This measurement also tells if you are at a healthy weight and may help predict your risk of certain diseases, such as type 2 diabetes and high blood pressure. Heart rate and blood pressure. Body temperature. Skin for abnormal spots. What immunizations do I need? Vaccines are usually given at various ages, according to a schedule. Your health care provider will recommend vaccines for you based on your age, medical history, and lifestyle or other  factors, such as travel or where you work. What tests do I need? Screening Your health care provider may recommend screening tests for certain conditions. This may include: Lipid and cholesterol levels. Hepatitis C test. Hepatitis B test. HIV (human immunodeficiency virus) test. STI (sexually transmitted infection) testing, if you are at risk. Lung cancer screening. Colorectal cancer screening. Diabetes screening. This is done by checking your blood sugar (glucose) after you have not eaten for a while (fasting). Mammogram. Talk with your health care provider about how often you should have regular mammograms. BRCA-related cancer screening. This may be done if you have a family history of breast, ovarian, tubal, or peritoneal cancers. Bone density scan. This is done to screen for osteoporosis. Talk with your health care provider about your test results, treatment options, and if necessary, the need for more tests. Follow these instructions at home: Eating and drinking  Eat a diet that includes fresh fruits and vegetables, whole grains, lean protein, and low-fat dairy products. Limit your intake of foods with high amounts of sugar, saturated fats, and salt. Take vitamin and mineral supplements as recommended by your health care provider. Do not drink alcohol if your health care provider tells you not to drink. If you drink alcohol: Limit how much you have to 0-1 drink a day. Know how much alcohol is in your drink. In the [place] one drink equals one 12 oz bottle of beer (355 mL), one 5 oz glass of wine (148 mL), or one 1 oz glass of hard liquor (44 mL). Lifestyle Brush your teeth every morning and night with fluoride toothpaste. Floss one time each day. Exercise for at least 30 minutes 5 or more days each week.  Do not use any products that contain nicotine or tobacco. These products include cigarettes, chewing tobacco, and vaping devices, such as e-cigarettes. If you need help quitting, ask  your health care provider. Do not use drugs. If you are sexually active, practice safe sex. Use a condom or other form of protection in order to prevent STIs. Take aspirin only as told by your health care provider. Make sure that you understand how much to take and what form to take. Work with your health care provider to find out whether it is safe and beneficial for you to take aspirin daily. Ask your health care provider if you need to take a cholesterol-lowering medicine (statin). Find healthy ways to manage stress, such as: Meditation, yoga, or listening to music. Journaling. Talking to a trusted person. Spending time with friends and family. Minimize exposure to UV radiation to reduce your risk of skin cancer. Safety Always wear your seat belt while driving or riding in a vehicle. Do not drive: If you have been drinking alcohol. Do not ride with someone who has been drinking. When you are tired or distracted. While texting. If you have been using any mind-altering substances or drugs. Wear a helmet and other protective equipment during sports activities. If you have firearms in your house, make sure you follow all gun safety procedures. What's next? Visit your health care provider once a year for an annual wellness visit. Ask your health care provider how often you should have your eyes and teeth checked. Stay up to date on all vaccines. This information is not intended to replace advice given to you by your health care provider. Make sure you discuss any questions you have with your health care provider. Document Revised: 03/17/2021 Document Reviewed: 03/17/2021 Elsevier Patient Education  [place].

## 2021-11-25 NOTE — Assessment & Plan Note (Signed)
Following with nephrology, she will be seeing them soon.  Repeat renal function, magnesium, iron studies pending

## 2021-11-25 NOTE — Assessment & Plan Note (Signed)
Above goal today. Prior visits are overall okay.  She is motivated to work on weight loss through diet and exercise.  Commended her on the changes thus far.  Continue amlodipine 10 mg daily, losartan 100 mg daily.  CMP pending.

## 2021-11-25 NOTE — Assessment & Plan Note (Signed)
Immunizations up-to-date. Mammogram and bone density due, orders placed. Colonoscopy up-to-date, due in 2024.  Encouraged healthy diet regular exercise, commended her on her changes thus far  Exam today stable. Labs pending

## 2021-11-25 NOTE — Assessment & Plan Note (Signed)
Inconsistent use of venlafaxine ER 37.5 mg.  Given history of fibromyalgia and chronic pain, discussed with patient to switch to duloxetine 30 mg daily for pain, anxiety, depression.  She agrees.  New prescription sent to pharmacy.

## 2021-11-25 NOTE — Progress Notes (Signed)
de

## 2021-11-26 ENCOUNTER — Other Ambulatory Visit: Payer: Self-pay | Admitting: Primary Care

## 2021-11-27 ENCOUNTER — Telehealth: Payer: Self-pay | Admitting: Primary Care

## 2021-11-27 DIAGNOSIS — E1165 Type 2 diabetes mellitus with hyperglycemia: Secondary | ICD-10-CM

## 2021-11-27 DIAGNOSIS — E119 Type 2 diabetes mellitus without complications: Secondary | ICD-10-CM

## 2021-11-29 ENCOUNTER — Other Ambulatory Visit: Payer: Medicare HMO

## 2021-11-29 NOTE — Telephone Encounter (Signed)
°  Encourage patient to contact the pharmacy for refills or they can request refills through [place]:  Please schedule appointment if longer than 1 year  NEXT APPOINTMENT DATE:  MEDICATION: accu-check strips, insulin pen , cymbalta  Is the patient out of medication? yes  PHARMACY: [facility]  Let patient know to contact pharmacy at the end of the day to make sure medication is ready.  Please notify patient to allow 48-72 hours to process  CLINICAL FILLS OUT ALL BELOW:   LAST REFILL:  QTY:  REFILL DATE:    OTHER COMMENTS:    Okay for refill?  Please advise

## 2021-11-30 MED ORDER — INSULIN PEN NEEDLE 32G X 6 MM MISC: 3 refills | Status: DC

## 2021-11-30 MED ORDER — GLUCOSE BLOOD VI STRP: ORAL_STRIP | 5 refills | Status: DC

## 2021-11-30 NOTE — Telephone Encounter (Signed)
Noted, both prescriptions sent to pharmacy.

## 2021-11-30 NOTE — Telephone Encounter (Signed)
Called patient she needs the Accu-check strips and needles for her pen. Has picked up all other medications needed.

## 2021-11-30 NOTE — Telephone Encounter (Signed)
Need clarification:  She has the freestyle Angustura. Does she still want glucose testing strips? Or is she requesting refills of the Freestyle sensors? Which one? I sent Cymbalta as a new Rx on 11/25/21, 90 day supply. Not sure why she is requesting a refill. Insulin pen needle or pen? I sent refills of her Soliqua pen on 11/28/21

## 2021-12-01 ENCOUNTER — Other Ambulatory Visit: Payer: Self-pay | Admitting: Primary Care

## 2021-12-01 DIAGNOSIS — E1165 Type 2 diabetes mellitus with hyperglycemia: Secondary | ICD-10-CM

## 2021-12-01 DIAGNOSIS — Z794 Long term (current) use of insulin: Secondary | ICD-10-CM

## 2021-12-21 ENCOUNTER — Telehealth: Payer: Self-pay

## 2021-12-21 NOTE — Chronic Care Management (AMB) (Signed)
? ? ?  Chronic Care Management ?Pharmacy Assistant  ? ?Name: Danielle Edwards  MRN: [id number] DOB: [birth year] ? ? ?Reason for Encounter: Reminder Call ?  ?Conditions to be addressed/monitored: ?HTN, HLD, DMII, and CKD Stage 3 ? ? ?Medications: ?Outpatient Encounter Medications as of 12/21/2021  ?Medication Sig  ? Accu-Chek FastClix Lancets MISC USE AS INSTRUCTED TO CHECK BLOOD SUGAR UP TO 3 TIMES DAILY  ? amLODipine (NORVASC) 10 MG tablet Take 1 tablet (10 mg total) by mouth daily. for blood pressure  ? atorvastatin (LIPITOR) 40 MG tablet TAKE ONE TABLET BY MOUTH AT BEDTIME FOR CHOLESTEROL  ? Cholecalciferol (VITAMIN D3) 125 MCG (5000 UT) CAPS Take 5,000 Units by mouth daily.  ? DULoxetine (CYMBALTA) 30 MG capsule Take 1 capsule (30 mg total) by mouth daily. For pain, anxiety, depression.  ? glucose blood (ACCU-CHEK GUIDE) test strip USE UP TO FOUR TIMES DAILY TO CHECK BLOOD SUGAR (E10.9 E11.9)  ? Insulin Glargine-Lixisenatide (SOLIQUA) 100-33 UNT-MCG/ML SOPN Inject 25 Units into the skin 2 (two) times daily. For diabetes.  ? Insulin Pen Needle 32G X 6 MM MISC Use as instructed to inject insulin twice daily  ? loperamide (IMODIUM A-D) 2 MG tablet Take 2 mg by mouth 4 (four) times daily as needed for diarrhea or loose stools.  ? losartan (COZAAR) 100 MG tablet Take 1 tablet (100 mg total) by mouth daily. for blood pressure  ? Magnesium 200 MG TABS Take by mouth daily. (Patient not taking: Reported on 11/25/2021)  ? metFORMIN (GLUCOPHAGE-XR) 500 MG 24 hr tablet Take 4 tablets (2,000 mg total) by mouth daily with breakfast. For diabetes.  ? Multiple Vitamins-Minerals (CENTRUM SILVER ULTRA WOMENS PO) Take by mouth daily.  ? oxyCODONE (OXY IR/ROXICODONE) 5 MG immediate release tablet   ? pantoprazole (PROTONIX) 40 MG tablet   ? promethazine (PHENERGAN) 25 MG tablet Take 25 mg by mouth every 6 (six) hours as needed. Reported on 09/21/2015  ? XTAMPZA ER 13.5 MG C12A  (Patient not taking: Reported on 11/25/2021)  ? ?No  facility-administered encounter medications on file as of 12/21/2021.  ? ?Danielle Edwards was contacted to remind of upcoming telephone visit with Charlene Brooke on 12/24/21 at 1:45pm. Patient was reminded to have any blood glucose and blood pressure readings available for review at appointment.  ? ?Patient confirmed appointment. ? ? ?Are you having any problems with your medications? No  ? ?Do you have any concerns you like to discuss with the pharmacist?  She was able to get the [facility] this year and she feels like this has made a difference with monitoring and keeping her on track. ? ? ? ?Star Rating Drugs: ?Medication:   Last Fill: Day Supply ?Atorvastatin '40mg'$   11/25/21 90 ?Losartan '100mg'$   11/25/21 90 ?Metformin XR '500mg'$   11/25/21 90 ?Soliqua 100-33  11/29/21 30 ? ?Charlene Brooke, CPP notified ? ?Danielle Edwards, CCMA ?Health concierge  ?[phone]  ? ?  ?

## 2021-12-22 ENCOUNTER — Other Ambulatory Visit: Payer: Self-pay | Admitting: Primary Care

## 2021-12-22 DIAGNOSIS — Z1231 Encounter for screening mammogram for malignant neoplasm of breast: Secondary | ICD-10-CM

## 2021-12-24 ENCOUNTER — Telehealth: Payer: Self-pay | Admitting: Pharmacist

## 2021-12-24 ENCOUNTER — Other Ambulatory Visit: Payer: Self-pay

## 2021-12-24 ENCOUNTER — Ambulatory Visit (INDEPENDENT_AMBULATORY_CARE_PROVIDER_SITE_OTHER): Payer: Medicare HMO | Admitting: Pharmacist

## 2021-12-24 DIAGNOSIS — E1169 Type 2 diabetes mellitus with other specified complication: Secondary | ICD-10-CM

## 2021-12-24 DIAGNOSIS — Z794 Long term (current) use of insulin: Secondary | ICD-10-CM

## 2021-12-24 DIAGNOSIS — E1165 Type 2 diabetes mellitus with hyperglycemia: Secondary | ICD-10-CM

## 2021-12-24 DIAGNOSIS — N183 Chronic kidney disease, stage 3 unspecified: Secondary | ICD-10-CM

## 2021-12-24 DIAGNOSIS — I10 Essential (primary) hypertension: Secondary | ICD-10-CM

## 2021-12-24 NOTE — Telephone Encounter (Signed)
In reviewing recent OV, patient had high potassium (5.2) 11/25/21 and was meant to return for repeat lab, she has not done so. ? ?Potassium lab has already been ordered, pt needs to be scheduled for repeat lab. ?

## 2021-12-24 NOTE — Progress Notes (Signed)
? ?Chronic Care Management ?Pharmacy Note ? ?12/24/2021 ?Name:  Danielle Edwards MRN:  [id number] DOB:  1955-12-20 ? ?Summary: CCM F/U visit ?-Pt checks BG 6-7 times daily with [facility], she reports very few random readings >200. She did have 1-2 low alarms < 70 when she first started new Soliqua dose but lately has not had issues ?-Pt had high K (5.2) 11/25/21 and was meant to return for repeat labwork, but has not done so ? ?Recommendations/Changes made from today's visit: ?-No med changes ?-Advised to return for repeat K+ - coordinating with office staff to schedule ? ?Plan: ?-Weld will call patient 3 months for DM update ?-Pharmacist follow up televisit scheduled for 5 months ?-AWV 02/24/2022 ? ? ? ?Subjective: ?Danielle Edwards is an 66 y.o. year old female who is a primary patient of Pleas Koch, NP.  The CCM team was consulted for assistance with disease management and care coordination needs.   ? ?Engaged with patient by telephone for follow up visit in response to provider referral for pharmacy case management and/or care coordination services.  ? ?Consent to Services:  ?The patient was given information about Chronic Care Management services, agreed to services, and gave verbal consent prior to initiation of services.  Please see initial visit note for detailed documentation.  ? ?Patient Care Team: ?Pleas Koch, NP as PCP - General (Nurse Practitioner) ?Amadi Yoshino, [place], [facility] as Pharmacist (Pharmacist) ? ?Recent office visits: ?11/25/21 NP Allie Bossier OV: Annual exam. Iron is low - f/u with nephrology. Continue same DM regimen given improved fasting BG. Start duloxetine 30 mg daily. Advised DEXA and Mammogram. ? ?Recent consult visits: ?11/01/21 Dr Sander Radon (Pain medicine): f/u back/neck pain ? ?Hospital visits: ?None in previous 6 months ? ? ?Objective: ? ?Lab Results  ?Component Value Date  ? CREATININE 1.33 (H) 11/25/2021  ? BUN 35 (H) 11/25/2021  ? GFR 41.66 (L) 11/25/2021  ?  NA 141 11/25/2021  ? K 5.2 No hemolysis seen (H) 11/25/2021  ? CALCIUM 10.0 11/25/2021  ? CO2 28 11/25/2021  ? GLUCOSE 136 (H) 11/25/2021  ? ? ?Lab Results  ?Component Value Date/Time  ? HGBA1C 9.4 (H) 11/25/2021 01:22 PM  ? HGBA1C 10.0 (A) 07/16/2021 11:14 AM  ? HGBA1C 12.0 (A) 04/02/2021 11:19 AM  ? HGBA1C 10.9 (H) 06/11/2020 03:14 PM  ? GFR 41.66 (L) 11/25/2021 01:22 PM  ? GFR 44.74 (L) 12/25/2020 11:25 AM  ? MICROALBUR 4.4 (H) 08/05/2016 11:14 AM  ? MICROALBUR 2.0 (H) 04/07/2015 09:15 AM  ?  ?Last diabetic Eye exam:  ?Lab Results  ?Component Value Date/Time  ? HMDIABEYEEXA No Retinopathy 02/26/2021 12:00 AM  ?  ?Last diabetic Foot exam: No results found for: HMDIABFOOTEX  ? ?Lab Results  ?Component Value Date  ? CHOL 104 11/25/2021  ? HDL 45.10 11/25/2021  ? LDLCALC 44 11/25/2021  ? LDLDIRECT 112.0 02/05/2018  ? TRIG 76.0 11/25/2021  ? CHOLHDL 2 11/25/2021  ? ? ? ?  Latest Ref Rng & Units 11/25/2021  ?  1:22 PM 06/11/2020  ?  3:14 PM 12/11/2018  ?  4:59 PM  ?Hepatic Function  ?Total Protein 6.0 - 8.3 g/dL 7.5   7.5   7.9    ?Albumin 3.5 - 5.2 g/dL 4.2   4.0   4.2    ?AST 0 - 37 U/L 14   29   30     ?ALT 0 - 35 U/L 15   31   29     ?Alk  Phosphatase 39 - 117 U/L 69   77   88    ?Total Bilirubin 0.2 - 1.2 mg/dL 0.3   0.4   0.2    ? ? ?Lab Results  ?Component Value Date/Time  ? TSH 2.84 11/25/2021 01:22 PM  ? TSH 2.29 06/11/2020 03:14 PM  ? ? ? ?  Latest Ref Rng & Units 11/25/2021  ?  1:22 PM 04/02/2021  ? 11:45 AM 12/20/2019  ? 11:29 AM  ?CBC  ?WBC 4.0 - 10.5 K/uL 7.4   7.4   7.3    ?Hemoglobin 12.0 - 15.0 g/dL 11.3   10.8   11.1    ?Hematocrit 36.0 - 46.0 % 35.2   33.3   34.1    ?Platelets 150.0 - 400.0 K/uL 294.0   270.0   253.0    ? ? ?Lab Results  ?Component Value Date/Time  ? VD25OH 62.85 11/25/2021 01:22 PM  ? VD25OH 54.23 12/25/2020 11:25 AM  ? ? ?Clinical ASCVD: No  ?The ASCVD Risk score (Arnett DK, et al., 2019) failed to calculate for the following reasons: ?  The valid total cholesterol range is 130 to 320 mg/dL    ? ? ?  02/23/2021  ?  1:32 PM 12/08/2020  ?  1:27 PM 09/12/2019  ? 10:39 AM  ?Depression screen PHQ 2/9  ?Decreased Interest 0 1 0  ?Down, Depressed, Hopeless 0 0 0  ?PHQ - 2 Score 0 1 0  ?Altered sleeping 0  0  ?Tired, decreased energy 0  0  ?Change in appetite 0  0  ?Feeling bad or failure about yourself  0  0  ?Trouble concentrating 0  0  ?Moving slowly or fidgety/restless 0  0  ?Suicidal thoughts 0  0  ?PHQ-9 Score 0  0  ?Difficult doing work/chores Not difficult at all  Not difficult at all  ?  ? ? ?Social History  ? ?Tobacco Use  ?Smoking Status Never  ?Smokeless Tobacco Never  ? ?BP Readings from Last 3 Encounters:  ?11/25/21 140/76  ?07/16/21 136/74  ?05/26/21 134/72  ? ?Pulse Readings from Last 3 Encounters:  ?11/25/21 96  ?07/16/21 79  ?05/26/21 64  ? ?Wt Readings from Last 3 Encounters:  ?11/25/21 258 lb (117 kg)  ?11/16/21 255 lb (115.7 kg)  ?07/16/21 265 lb (120.2 kg)  ? ?BMI Readings from Last 3 Encounters:  ?11/25/21 41.64 kg/m?  ?11/16/21 41.16 kg/m?  ?07/16/21 42.77 kg/m?  ? ? ?Assessment/Interventions: Review of patient past medical history, allergies, medications, health status, including review of consultants reports, laboratory and other test data, was performed as part of comprehensive evaluation and provision of chronic care management services.  ? ?SDOH:  (Social Determinants of Health) assessments and interventions performed: No ? ?SDOH Screenings  ? ?Alcohol Screen: Low Risk   ? Last Alcohol Screening Score (AUDIT): 1  ?Depression (PHQ2-9): Low Risk   ? PHQ-2 Score: 0  ?Financial Resource Strain: Low Risk   ? Difficulty of Paying Living Expenses: Not hard at all  ?Food Insecurity: No Food Insecurity  ? Worried About Charity fundraiser in the Last Year: Never true  ? Ran Out of Food in the Last Year: Never true  ?Housing: Low Risk   ? Last Housing Risk Score: 0  ?Physical Activity: Inactive  ? Days of Exercise per Week: 0 days  ? Minutes of Exercise per Session: 0 min  ?Social Connections:  Not on file  ?Stress: No Stress Concern Present  ? Feeling of  Stress : Not at all  ?Tobacco Use: Low Risk   ? Smoking Tobacco Use: Never  ? Smokeless Tobacco Use: Never  ? Passive Exposure: Not on file  ?Transportation Needs: No Transportation Needs  ? Lack of Transportation (Medical): No  ? Lack of Transportation (Non-Medical): No  ? ? ?CCM Care Plan ? ?No Known Allergies ? ?Medications Reviewed Today   ? ? Reviewed by Pleas Koch, NP (Nurse Practitioner) on 11/25/21 at 1337  Med List Status: <None>  ? ?Medication Order Taking? Sig Documenting Provider Last Dose Status Informant  ?Accu-Chek FastClix Lancets MISC [id number] Yes USE AS INSTRUCTED TO CHECK BLOOD SUGAR UP TO 3 TIMES DAILY Pleas Koch, NP Taking Active   ?amLODipine (NORVASC) 10 MG tablet [id number]  Take 1 tablet (10 mg total) by mouth daily. for blood pressure Pleas Koch, NP  Active   ?atorvastatin (LIPITOR) 40 MG tablet [id number]  TAKE ONE TABLET BY MOUTH AT BEDTIME FOR CHOLESTEROL Pleas Koch, NP  Active   ?blood glucose meter kit and supplies [id number] Yes Dispense based on patient and insurance preference. Use up to four times daily as directed. (FOR ICD-10 E10.9, E11.9). Pleas Koch, NP Taking Active   ?blood glucose meter kit and supplies KIT [id number] Yes Dispense based on patient and insurance preference. Use up to four times daily as directed. (FOR ICD-9 250.00, 250.01). Pleas Koch, NP Taking Active   ?Cholecalciferol (VITAMIN D3) 125 MCG (5000 UT) CAPS [id number] Yes Take 5,000 Units by mouth daily. [provider] Taking Active   ?DULoxetine (CYMBALTA) 30 MG capsule [id number] Yes Take 1 capsule (30 mg total) by mouth daily. For pain, anxiety, depression. Pleas Koch, NP  Active   ?Insulin Glargine-Lixisenatide [facility]) 100-33 UNT-MCG/ML SOPN [id number] Yes Inject 25 Units into the skin 2 (two) times daily. For diabetes. Pleas Koch, NP Taking Active   ?Insulin Pen Needle 32G X  6 MM MISC [id number] Yes Use as instructed to inject insulin daily at bedtime Pleas Koch, NP Taking Active   ?loperamide (IMODIUM A-D) 2 MG tablet [id number] Yes Take 2 mg by mouth 4 (four) times

## 2021-12-24 NOTE — Patient Instructions (Signed)
Visit Information ? ?Phone number for Pharmacist: [phone] ? ? Goals Addressed   ?None ?  ? ? ?Care Plan : [place]  ?Updates made by Charlton Haws, RPH since 12/24/2021 12:00 AM  ?  ? ?Problem: Hypertension, Hyperlipidemia, Diabetes, and Chronic Kidney Disease   ?Priority: High  ?  ? ?Long-Range Goal: Disease Management   ?Start Date: 12/08/2020  ?Expected End Date: 12/25/2022  ?This Visit's Progress: On track  ?Priority: High  ?Note:   ?Current Barriers:  ?None identified - blood glucose improving towards goal  ? ?Pharmacist Clinical Goal(s):  ?Patient will achieve adherence to monitoring guidelines and medication adherence to achieve therapeutic efficacy through collaboration with PharmD and provider.  ? ?Interventions: ?1:1 collaboration with Pleas Koch, NP regarding development and update of comprehensive plan of care as evidenced by provider attestation and co-signature ?Inter-disciplinary care team collaboration (see longitudinal plan of care) ?Comprehensive medication review performed; medication list updated in electronic medical record ? ?Hypertension / CKD Stage 3b (BP goal <140/90) ?-Controlled - controlled per clinic readings, no updates today, stable  ?-Current treatment: ?Losartan 100 mg  daily - Appropriate, Effective, Safe, Accessible ?Amlodipine 40 mg daily - Appropriate, Effective, Safe, Accessible ?-Medications previously tried: none reported ?-Current home readings: none reported, not routinely monitoring  ?-Denies hypotensive/hypertensive symptoms ?-Recommended to continue current medication ? ?Hyperlipidemia: (LDL goal < 70) ?-Controlled - LDL 44 (11/2021) at goal; adherence confirmed ?-Current treatment: ?Atorvastatin 40 mg daily - Appropriate, Effective, Safe, Accessible ?-Medications previously tried: none reported ?-Recommended to continue current medication ? ?Diabetes (A1c goal <7%) ?-Uncontrolled, improving - A1c 9.4% (11/2021) improved from 10%; pt reports  overall improvement since starting [facility] ?-Current home glucose readings: checking 6-7x daily with Elenor Legato; very few readings over 200. She has had 1-2 low sugar alarms (<70) overnight when she first started new Soliqua dose but doing better now ?-Current medications: ?Soliqua 25 units BID - Appropriate, Effective, Safe, Accessible ?Metformin XR 500 mg - 4 tab daily - Appropriate, Effective, Safe, Accessible ?Freestyle Libre 2 ?-Medications previously tried: glimepiride, Jardiance (vaginal discomfort/yeast infections), Januvia (cost), Trulicity (cost) ?-Educated on A1c and blood sugar goals; ?Prevention and management of hypoglycemic episodes; ?Continuous glucose monitoring; ?-Recommended to continue current medication ? ?Hyperkalemia  ?-K 5.2 11/25/21, pt was advised to repeat bloodwork as soon as able and she has not done this yet. Advised pt to schedule lab appt for Potassium re-check. ? ?Patient Goals/Self-Care Activities ?Patient will:  ?- take medications as prescribed as evidenced by patient report and record review ?focus on medication adherence by routine ?check glucose using Libre, document, and provide at future appointments ?engage in dietary modifications by limiting carb/sugar ? ?  ?  ? ?Patient verbalizes understanding of instructions and care plan provided today and agrees to view in [place]. Active MyChart status confirmed with patient.   ?Telephone follow up appointment with pharmacy team member scheduled for: 5 months ? ?Charlene Brooke, PharmD, BCACP ?Clinical Pharmacist ?[facility] ?[phone] ?  ?

## 2021-12-28 DIAGNOSIS — M5382 Other specified dorsopathies, cervical region: Secondary | ICD-10-CM | POA: Diagnosis not present

## 2021-12-28 DIAGNOSIS — M5412 Radiculopathy, cervical region: Secondary | ICD-10-CM | POA: Diagnosis not present

## 2021-12-30 NOTE — Telephone Encounter (Signed)
Lab appt has been scheduled for 01/03/22 '@1'$  pm ?

## 2021-12-31 DIAGNOSIS — E785 Hyperlipidemia, unspecified: Secondary | ICD-10-CM

## 2021-12-31 DIAGNOSIS — I129 Hypertensive chronic kidney disease with stage 1 through stage 4 chronic kidney disease, or unspecified chronic kidney disease: Secondary | ICD-10-CM

## 2021-12-31 DIAGNOSIS — Z7984 Long term (current) use of oral hypoglycemic drugs: Secondary | ICD-10-CM | POA: Diagnosis not present

## 2021-12-31 DIAGNOSIS — Z794 Long term (current) use of insulin: Secondary | ICD-10-CM

## 2021-12-31 DIAGNOSIS — N1832 Chronic kidney disease, stage 3b: Secondary | ICD-10-CM

## 2021-12-31 DIAGNOSIS — E1122 Type 2 diabetes mellitus with diabetic chronic kidney disease: Secondary | ICD-10-CM | POA: Diagnosis not present

## 2022-01-03 ENCOUNTER — Other Ambulatory Visit (INDEPENDENT_AMBULATORY_CARE_PROVIDER_SITE_OTHER): Payer: Medicare HMO

## 2022-01-03 DIAGNOSIS — E875 Hyperkalemia: Secondary | ICD-10-CM | POA: Diagnosis not present

## 2022-01-03 LAB — POTASSIUM: Potassium: 4.4 mEq/L (ref 3.5–5.1)

## 2022-01-28 DIAGNOSIS — L7211 Pilar cyst: Secondary | ICD-10-CM | POA: Diagnosis not present

## 2022-01-28 DIAGNOSIS — L821 Other seborrheic keratosis: Secondary | ICD-10-CM | POA: Diagnosis not present

## 2022-01-31 DIAGNOSIS — N1832 Chronic kidney disease, stage 3b: Secondary | ICD-10-CM | POA: Diagnosis not present

## 2022-01-31 DIAGNOSIS — D508 Other iron deficiency anemias: Secondary | ICD-10-CM | POA: Diagnosis not present

## 2022-01-31 DIAGNOSIS — I10 Essential (primary) hypertension: Secondary | ICD-10-CM | POA: Diagnosis not present

## 2022-01-31 DIAGNOSIS — E1122 Type 2 diabetes mellitus with diabetic chronic kidney disease: Secondary | ICD-10-CM | POA: Diagnosis not present

## 2022-01-31 DIAGNOSIS — G4733 Obstructive sleep apnea (adult) (pediatric): Secondary | ICD-10-CM | POA: Diagnosis not present

## 2022-02-01 DIAGNOSIS — E119 Type 2 diabetes mellitus without complications: Secondary | ICD-10-CM | POA: Diagnosis not present

## 2022-02-02 ENCOUNTER — Ambulatory Visit: Payer: Medicare HMO | Admitting: Primary Care

## 2022-02-02 ENCOUNTER — Encounter: Payer: Self-pay | Admitting: Primary Care

## 2022-02-02 ENCOUNTER — Ambulatory Visit
Admission: RE | Admit: 2022-02-02 | Discharge: 2022-02-02 | Disposition: A | Discharge status: Home / Self Care | Payer: Medicare HMO | Source: Ambulatory Visit | Attending: Primary Care | Admitting: Primary Care

## 2022-02-02 VITALS — BP 142/74 | HR 89 | Temp 97.7°F | Ht 66.0 in | Wt 255.6 lb

## 2022-02-02 DIAGNOSIS — Z1231 Encounter for screening mammogram for malignant neoplasm of breast: Secondary | ICD-10-CM | POA: Insufficient documentation

## 2022-02-02 DIAGNOSIS — R0683 Snoring: Secondary | ICD-10-CM | POA: Insufficient documentation

## 2022-02-02 NOTE — Progress Notes (Signed)
? ?'@Patient'$  ID: Danielle Edwards, female    DOB: [birth year], 66 y.o.   MRN: [id number] ? ?Chief Complaint  ?Patient presents with  ? sleep consult  ?  Prior sleep study 35y ago + for OSA-no cpap. C/o loud snoring, daytime sleepiness and gasping for air during sleep.  ? ? ?Referring provider: ?Murlean Iba, MD ? ?HPI: ?66 year old female, never smoked. PMH significant for HTN, recurrent sinusitis, GERD, type 2 diabetes, hypothyroidism, hyperlipidemia, vit D deficiency.  ? ?02/02/2022 ?Patient presents today for sleep consult. She had a prior sleep study >30 years ago. She never followed up on these results and states that she was never started on CPAP. She continues to have symptoms of loud snoring, apnea and daytime sleepiness. She manages this by sleeping in a recliner. She rarely sleeps flat in bed but when she does she wakes up gasping for air. Denies narcolepsy, cataplexy or sleep walking.  ? ?Sleep questionnaire: ?Symptoms- Loud snoring, waking up gasping for air, daytime sleepiness ?Bedtime- 11pm-1am ?Time to fall asleep- 15 mins ?Nocturnal awakenings- 3 times ?Start day/out of bed- 5:30am ?Do you operate heavy machinery- No [place] ?Weight changes in 2 years- 20 lbs  ?Do you wear CPAP- No ?Do you wear oxygen- No ?Epworth-  9 ? ? ?No Known Allergies ? ?Immunization History  ?Administered Date(s) Administered  ? DTaP 12/01/2013  ? Fluad Quad(high Dose 65+) 07/21/2020, 07/16/2021  ? Influenza Inj Mdck Quad Pf 11/03/2017  ? Influenza,inj,Quad PF,6+ Mos 06/29/2015, 05/16/2018, 09/11/2019  ? Influenza-Unspecified 09/11/2019  ? Janssen (J&J) SARS-COV-2 Vaccination 12/11/2019  ? PFIZER(Purple Top)SARS-COV-2 Vaccination 09/19/2020  ? PNEUMOCOCCAL CONJUGATE-20 05/26/2021  ? Pneumococcal Polysaccharide-23 04/13/2015  ? Zoster Recombinat (Shingrix) 05/21/2018, 09/12/2018  ? ? ?Past Medical History:  ?Diagnosis Date  ? Allergy   ? seasonal allergies  ? Anemia   ? hx of IDA  ? Anxiety   ? on  meds  ? Arthritis   ? PRN meds-osteoarthritis  ? Cataract   ? sx to remove  ? Chronic kidney disease   ? CKD-stage 3  ? Chronic pain   ? COVID-19 06/02/2020  ? Depression   ? on meds  ? Diabetes type 2, controlled ([place])   ? on meds  ? Fibromyalgia   ? GERD (gastroesophageal reflux disease)   ? on meds  ? Headache   ? History of colonic polyps 1999  ? Benign  ? Hx of adenomatous polyp of colon 09/27/2000  ? 2001 - diminutive adenoma [place] 2007 no polyps - [place]  ? Hyperlipidemia   ? on meds  ? Hypertension   ? on meds  ? Hypothyroidism   ? not on meds at this time  ? IBS (irritable bowel syndrome)   ? Diarrhea type  ? Neuromuscular disorder ([place])   ? Vitamin D deficiency   ? ? ?Tobacco History: ?Social History  ? ?Tobacco Use  ?Smoking Status Never  ?Smokeless Tobacco Never  ? ?Counseling given: Not Answered ? ? ?Outpatient Medications Prior to Visit  ?Medication Sig Dispense Refill  ? Accu-Chek FastClix Lancets MISC USE AS INSTRUCTED TO CHECK BLOOD SUGAR UP TO 3 TIMES DAILY 306 each 1  ? amLODipine (NORVASC) 10 MG tablet Take 1 tablet (10 mg total) by mouth daily. for blood pressure 90 tablet 3  ? atorvastatin (LIPITOR) 40 MG tablet TAKE ONE TABLET BY MOUTH AT BEDTIME FOR CHOLESTEROL 90 tablet 3  ? Cholecalciferol (VITAMIN D3) 125 MCG (5000 UT) CAPS Take 5,000  Units by mouth daily.    ? DULoxetine (CYMBALTA) 30 MG capsule Take 1 capsule (30 mg total) by mouth daily. For pain, anxiety, depression. 90 capsule 0  ? glucose blood (ACCU-CHEK GUIDE) test strip USE UP TO FOUR TIMES DAILY TO CHECK BLOOD SUGAR (E10.9 E11.9) 200 each 5  ? Insulin Glargine-Lixisenatide (SOLIQUA) 100-33 UNT-MCG/ML SOPN Inject 25 Units into the skin 2 (two) times daily. For diabetes. 45 mL 1  ? Insulin Pen Needle 32G X 6 MM MISC Use as instructed to inject insulin twice daily 200 each 3  ? loperamide (IMODIUM A-D) 2 MG tablet Take 2 mg by mouth 4 (four) times daily as needed for diarrhea or loose stools.    ? losartan (COZAAR) 100 MG  tablet Take 1 tablet (100 mg total) by mouth daily. for blood pressure 90 tablet 3  ? Magnesium 200 MG TABS Take by mouth daily.    ? metFORMIN (GLUCOPHAGE-XR) 500 MG 24 hr tablet Take 4 tablets (2,000 mg total) by mouth daily with breakfast. For diabetes. 360 tablet 1  ? Multiple Vitamins-Minerals (CENTRUM SILVER ULTRA WOMENS PO) Take by mouth daily.    ? oxyCODONE (OXY IR/ROXICODONE) 5 MG immediate release tablet     ? pantoprazole (PROTONIX) 40 MG tablet     ? promethazine (PHENERGAN) 25 MG tablet Take 25 mg by mouth every 6 (six) hours as needed. Reported on 09/21/2015  1  ? XTAMPZA ER 13.5 MG C12A     ? ?No facility-administered medications prior to visit.  ? ?Review of Systems ? ?Review of Systems  ?Constitutional:  Positive for fatigue.  ?HENT: Negative.    ?Respiratory:  Positive for apnea. Negative for cough, shortness of breath and wheezing.   ?Psychiatric/Behavioral:  Positive for sleep disturbance.   ? ? ?Physical Exam ? ?BP (!) 142/74 (BP Location: Left Wrist, Cuff Size: Large)   Pulse 89   Temp 97.7 ?F (36.5 ?C) (Temporal)   Ht '5\' 6"'$  (1.676 m)   Wt 255 lb 9.6 oz (115.9 kg)   SpO2 95%   BMI 41.25 kg/m?  ?Physical Exam ?Constitutional:   ?   General: She is not in acute distress. ?   Appearance: Normal appearance. She is obese. She is not ill-appearing.  ?HENT:  ?   Head: Normocephalic and atraumatic.  ?   Mouth/Throat:  ?   Mouth: Mucous membranes are moist.  ?   Pharynx: Oropharynx is clear.  ?   Comments: Mallampati class II ?Cardiovascular:  ?   Rate and Rhythm: Normal rate and regular rhythm.  ?Pulmonary:  ?   Effort: Pulmonary effort is normal.  ?   Breath sounds: Normal breath sounds.  ?Musculoskeletal:     ?   General: Normal range of motion.  ?   Cervical back: Normal range of motion and neck supple.  ?Skin: ?   General: Skin is warm and dry.  ?Neurological:  ?   General: No focal deficit present.  ?   Mental Status: She is alert and oriented to person, place, and time. Mental status is at  baseline.  ?Psychiatric:     ?   Mood and Affect: Mood normal.     ?   Behavior: Behavior normal.     ?   Thought Content: Thought content normal.     ?   Judgment: Judgment normal.  ?  ? ?Lab Results: ? ?CBC ?   ?Component Value Date/Time  ? WBC 7.4 11/25/2021 1322  ? RBC 4.08  11/25/2021 1322  ? HGB 11.3 (L) 11/25/2021 1322  ? HCT 35.2 (L) 11/25/2021 1322  ? PLT 294.0 11/25/2021 1322  ? MCV 86.4 11/25/2021 1322  ? MCHC 32.0 11/25/2021 1322  ? RDW 14.2 11/25/2021 1322  ? LYMPHSABS 2.1 01/23/2018 1048  ? MONOABS 0.7 01/23/2018 1048  ? EOSABS 0.4 01/23/2018 1048  ? BASOSABS 0.1 01/23/2018 1048  ? ? ?BMET ?   ?Component Value Date/Time  ? NA 141 11/25/2021 1322  ? K 4.4 01/03/2022 1309  ? CL 107 11/25/2021 1322  ? CO2 28 11/25/2021 1322  ? GLUCOSE 136 (H) 11/25/2021 1322  ? BUN 35 (H) 11/25/2021 1322  ? CREATININE 1.33 (H) 11/25/2021 1322  ? CALCIUM 10.0 11/25/2021 1322  ? ? ?BNP ?No results found for: BNP ? ?ProBNP ?No results found for: PROBNP ? ?Imaging: ?No results found. ? ? ?Assessment & Plan:  ? ?Loud snoring ?- Patient has symptoms of loud snoring, apnea and daytime sleepiness. Epworth 9. BMI 41. She had a prior sleep study > 30 years ago that sounds be be inconclusive, results are not available. Her weight has increased 20 lbs over the last 2 years. Concern she could have sleep apnea, needs HST to evaluate for OSA. Discuss risk of untreated sleep apnea including cardiac arrhythmias, stroke, pulmonary hypertension and diabetes.  We also reviewed treatment options including weight loss, oral appliance, CPAP therapy or referral to ENT for possible surgical options.  Encourage patient work on weight loss efforts and sleep with head of bed elevated with recliner.  Advised patient against driving if experiencing excessive daytime sleepiness or fatigue.  Patient to follow-up in 4 to 6 weeks to review sleep study results and treatment options if needed. ? ? ?Martyn Ehrich, NP ?02/02/2022 ? ?

## 2022-02-02 NOTE — Progress Notes (Signed)
Reviewed and agree with assessment/plan. ? ? ?Chesley Mires, MD ?[place] ?02/02/2022, 1:18 PM ?Pager:  [phone] ? ?

## 2022-02-02 NOTE — Assessment & Plan Note (Addendum)
-   Patient has symptoms of loud snoring, apnea and daytime sleepiness. Epworth 9. BMI 41. She had a prior sleep study > 30 years ago that sounds be be inconclusive, results are not available. Her weight has increased 20 lbs over the last 2 years. Concern she could have sleep apnea, needs HST to evaluate for OSA. Discuss risk of untreated sleep apnea including cardiac arrhythmias, stroke, pulmonary hypertension and diabetes.  We also reviewed treatment options including weight loss, oral appliance, CPAP therapy or referral to ENT for possible surgical options.  Encourage patient work on weight loss efforts and sleep with head of bed elevated with recliner.  Advised patient against driving if experiencing excessive daytime sleepiness or fatigue.  Patient to follow-up in 4 to 6 weeks to review sleep study results and treatment options if needed. ?

## 2022-02-02 NOTE — Patient Instructions (Addendum)
Risk of untreated sleep apnea include cardiac arrhythmias, stroke, pulm HTN and diabetes ?  ?Treatment options include weight loss, oral appliance, CPAP therapy or referral to ENT for possible surgical options  ?  ?Recommendations: ?Work on weight loss efforts if needed  ?Do not drive if experiencing excessive daytime sleepiness  ?Sleep on your side or elevate your head (recliner) while sleeping ?  ?Orders: ?HST re: snoring  ?  ?Follow-up: ?Please call after sleep study is completed to scheduled follow-up with [facility] NP to review sleep study results and treatment if needed  ? ?Sleep Apnea ?Sleep apnea affects breathing during sleep. It causes breathing to stop for 10 seconds or more, or to become shallow. People with sleep apnea usually snore loudly. ?It can also increase the risk of: ?Heart attack. ?Stroke. ?Being very overweight (obese). ?Diabetes. ?Heart failure. ?Irregular heartbeat. ?High blood pressure. ?The goal of treatment is to help you breathe normally again. ?What are the causes? ? ?The most common cause of this condition is a collapsed or blocked airway. ?There are three kinds of sleep apnea: ?Obstructive sleep apnea. This is caused by a blocked or collapsed airway. ?Central sleep apnea. This happens when the brain does not send the right signals to the muscles that control breathing. ?Mixed sleep apnea. This is a combination of obstructive and central sleep apnea. ?What increases the risk? ?Being overweight. ?Smoking. ?Having a small airway. ?Being older. ?Being female. ?Drinking alcohol. ?Taking medicines to calm yourself (sedatives or tranquilizers). ?Having family members with the condition. ?Having a tongue or tonsils that are larger than normal. ?What are the signs or symptoms? ?Trouble staying asleep. ?Loud snoring. ?Headaches in the morning. ?Waking up gasping. ?Dry mouth or sore throat in the morning. ?Being sleepy or tired during the day. ?If you are sleepy or tired during the day, you may  also: ?Not be able to focus your mind (concentrate). ?Forget things. ?Get angry a lot and have mood swings. ?Feel sad (depressed). ?Have changes in your personality. ?Have less interest in sex, if you are female. ?Be unable to have an erection, if you are female. ?How is this treated? ? ?Sleeping on your side. ?Using a medicine to get rid of mucus in your nose (decongestant). ?Avoiding the use of alcohol, medicines to help you relax, or certain pain medicines (narcotics). ?Losing weight, if needed. ?Changing your diet. ?Quitting smoking. ?Using a machine to open your airway while you sleep, such as: ?An oral appliance. This is a mouthpiece that shifts your lower jaw forward. ?A CPAP device. This device blows air through a mask when you breathe out (exhale). ?An EPAP device. This has valves that you put in each nostril. ?A BIPAP device. This device blows air through a mask when you breathe in (inhale) and breathe out. ?Having surgery if other treatments do not work. ?Follow these instructions at home: ?Lifestyle ?Make changes that your doctor recommends. ?Eat a healthy diet. ?Lose weight if needed. ?Avoid alcohol, medicines to help you relax, and some pain medicines. ?Do not smoke or use any products that contain nicotine or tobacco. If you need help quitting, ask your doctor. ?General instructions ?Take over-the-counter and prescription medicines only as told by your doctor. ?If you were given a machine to use while you sleep, use it only as told by your doctor. ?If you are having surgery, make sure to tell your doctor you have sleep apnea. You may need to bring your device with you. ?Keep all follow-up visits. ?Contact a doctor if: ?  The machine that you were given to use during sleep bothers you or does not seem to be working. ?You do not get better. ?You get worse. ?Get help right away if: ?Your chest hurts. ?You have trouble breathing in enough air. ?You have an uncomfortable feeling in your back, arms, or  stomach. ?You have trouble talking. ?One side of your body feels weak. ?A part of your face is hanging down. ?These symptoms may be an emergency. Get help right away. Call your local emergency services (911 in the [place] ?Do not wait to see if the symptoms will go away. ?Do not drive yourself to the hospital. ?Summary ?This condition affects breathing during sleep. ?The most common cause is a collapsed or blocked airway. ?The goal of treatment is to help you breathe normally while you sleep. ?This information is not intended to replace advice given to you by your health care provider. Make sure you discuss any questions you have with your health care provider. ?Document Revised: 04/28/2021 Document Reviewed: 08/28/2020 ?Elsevier Patient Education ? [place]. ? ?

## 2022-02-08 ENCOUNTER — Encounter: Payer: Self-pay | Admitting: Oncology

## 2022-02-08 NOTE — Progress Notes (Signed)
Patient contacted for New pt visit. Chart reviewed and updated.  ?

## 2022-02-09 ENCOUNTER — Inpatient Hospital Stay: Payer: Medicare HMO | Attending: Oncology | Admitting: Oncology

## 2022-02-09 ENCOUNTER — Encounter: Payer: Self-pay | Admitting: Oncology

## 2022-02-09 ENCOUNTER — Inpatient Hospital Stay: Payer: Medicare HMO

## 2022-02-09 VITALS — BP 156/78 | HR 76 | Temp 96.4°F | Ht 66.0 in | Wt 258.0 lb

## 2022-02-09 DIAGNOSIS — R5383 Other fatigue: Secondary | ICD-10-CM | POA: Diagnosis not present

## 2022-02-09 DIAGNOSIS — Z79899 Other long term (current) drug therapy: Secondary | ICD-10-CM | POA: Insufficient documentation

## 2022-02-09 DIAGNOSIS — N1831 Chronic kidney disease, stage 3a: Secondary | ICD-10-CM | POA: Insufficient documentation

## 2022-02-09 DIAGNOSIS — Z8616 Personal history of COVID-19: Secondary | ICD-10-CM | POA: Diagnosis not present

## 2022-02-09 DIAGNOSIS — E119 Type 2 diabetes mellitus without complications: Secondary | ICD-10-CM | POA: Diagnosis not present

## 2022-02-09 DIAGNOSIS — R1084 Generalized abdominal pain: Secondary | ICD-10-CM

## 2022-02-09 DIAGNOSIS — M129 Arthropathy, unspecified: Secondary | ICD-10-CM | POA: Insufficient documentation

## 2022-02-09 DIAGNOSIS — D509 Iron deficiency anemia, unspecified: Secondary | ICD-10-CM | POA: Diagnosis not present

## 2022-02-09 DIAGNOSIS — Z809 Family history of malignant neoplasm, unspecified: Secondary | ICD-10-CM | POA: Diagnosis not present

## 2022-02-09 DIAGNOSIS — K219 Gastro-esophageal reflux disease without esophagitis: Secondary | ICD-10-CM | POA: Diagnosis not present

## 2022-02-09 DIAGNOSIS — E559 Vitamin D deficiency, unspecified: Secondary | ICD-10-CM | POA: Diagnosis not present

## 2022-02-09 DIAGNOSIS — D508 Other iron deficiency anemias: Secondary | ICD-10-CM

## 2022-02-09 HISTORY — DX: Iron deficiency anemia, unspecified: D50.9

## 2022-02-09 LAB — IRON AND TIBC
Iron: 31 ug/dL (ref 28–170)
Saturation Ratios: 7 % — ABNORMAL LOW (ref 10.4–31.8)
TIBC: 441 ug/dL (ref 250–450)
UIBC: 410 ug/dL

## 2022-02-09 LAB — COMPREHENSIVE METABOLIC PANEL
ALT: 19 U/L (ref 0–44)
AST: 18 U/L (ref 15–41)
Albumin: 3.9 g/dL (ref 3.5–5.0)
Alkaline Phosphatase: 78 U/L (ref 38–126)
Anion gap: 7 (ref 5–15)
BUN: 28 mg/dL — ABNORMAL HIGH (ref 8–23)
CO2: 24 mmol/L (ref 22–32)
Calcium: 9.2 mg/dL (ref 8.9–10.3)
Chloride: 105 mmol/L (ref 98–111)
Creatinine, Ser: 1.19 mg/dL — ABNORMAL HIGH (ref 0.44–1.00)
GFR, Estimated: 50 mL/min — ABNORMAL LOW (ref >60)
Glucose, Bld: 129 mg/dL — ABNORMAL HIGH (ref 70–99)
Potassium: 4 mmol/L (ref 3.5–5.1)
Sodium: 136 mmol/L (ref 135–145)
Total Bilirubin: 0.6 mg/dL (ref 0.3–1.2)
Total Protein: 7.9 g/dL (ref 6.5–8.1)

## 2022-02-09 LAB — TECHNOLOGIST SMEAR REVIEW
Plt Morphology: NORMAL
RBC MORPHOLOGY: NORMAL
WBC MORPHOLOGY: NORMAL

## 2022-02-09 LAB — CBC WITH DIFFERENTIAL/PLATELET
Abs Immature Granulocytes: 0.02 10*3/uL (ref 0.00–0.07)
Basophils Absolute: 0.1 10*3/uL (ref 0.0–0.1)
Basophils Relative: 1 %
Eosinophils Absolute: 0.3 10*3/uL (ref 0.0–0.5)
Eosinophils Relative: 3 %
HCT: 34.7 % — ABNORMAL LOW (ref 36.0–46.0)
Hemoglobin: 11 g/dL — ABNORMAL LOW (ref 12.0–15.0)
Immature Granulocytes: 0 %
Lymphocytes Relative: 28 %
Lymphs Abs: 2.6 10*3/uL (ref 0.7–4.0)
MCH: 28.5 pg (ref 26.0–34.0)
MCHC: 31.7 g/dL (ref 30.0–36.0)
MCV: 89.9 fL (ref 80.0–100.0)
Monocytes Absolute: 0.5 10*3/uL (ref 0.1–1.0)
Monocytes Relative: 5 %
Neutro Abs: 5.7 10*3/uL (ref 1.7–7.7)
Neutrophils Relative %: 63 %
Platelets: 301 10*3/uL (ref 150–400)
RBC: 3.86 MIL/uL — ABNORMAL LOW (ref 3.87–5.11)
RDW: 13.8 % (ref 11.5–15.5)
WBC: 9.1 10*3/uL (ref 4.0–10.5)
nRBC: 0 % (ref 0.0–0.2)

## 2022-02-09 LAB — RETIC PANEL
Immature Retic Fract: 12.8 % (ref 2.3–15.9)
RBC.: 3.83 MIL/uL — ABNORMAL LOW (ref 3.87–5.11)
Retic Count, Absolute: 56.7 10*3/uL (ref 19.0–186.0)
Retic Ct Pct: 1.5 % (ref 0.4–3.1)
Reticulocyte Hemoglobin: 32.2 pg (ref >27.9)

## 2022-02-09 LAB — FERRITIN: Ferritin: 8 ng/mL — ABNORMAL LOW (ref 11–307)

## 2022-02-09 LAB — LACTATE DEHYDROGENASE: LDH: 136 U/L (ref 98–192)

## 2022-02-09 NOTE — Progress Notes (Signed)
?Hematology/Oncology Consult note ?[phone] [phone] ?  ? ?   ? ? ?Patient Care Team: ?Pleas Koch, NP as PCP - General (Nurse Practitioner) ?Foltanski, Cleaster Corin, [facility] as Pharmacist (Pharmacist) ? ?REFERRING PROVIDER: ?Murlean Iba, MD  ?CHIEF COMPLAINTS/REASON FOR VISIT:  ?Evaluation of anemia ? ?HISTORY OF PRESENTING ILLNESS:  ? ?Danielle Edwards is a  66 y.o.  female with PMH listed below was seen in consultation at the request of  Murlean Iba, MD  for evaluation of anemia ? ?11/25/2021, patient had a CBC showed  hemoglobin 11.3, MCV 86.4.  Normal platelet count and white count. ?Iron saturation 9.7 ferritin 13. ?Reviewed previous blood work, patient has a chronically decreased iron saturation ?+ Abdominal discomfort, acid reflux.  No unintentional weight loss, night sweats, fever. ?+ Fatigue. ?Patient has previously tried oral supplementation and she is not able to tolerate due to GI side effects. ? ?Review of Systems  ?Constitutional:  Positive for fatigue. Negative for appetite change, chills and fever.  ?HENT:   Negative for hearing loss and voice change.   ?Eyes:  Negative for eye problems.  ?Respiratory:  Negative for chest tightness and cough.   ?Cardiovascular:  Negative for chest pain.  ?Gastrointestinal:  Positive for abdominal pain. Negative for abdominal distention, blood in stool, nausea and vomiting.  ?Endocrine: Negative for hot flashes.  ?Genitourinary:  Negative for difficulty urinating and frequency.   ?Musculoskeletal:  Negative for arthralgias.  ?Skin:  Negative for itching and rash.  ?Neurological:  Negative for extremity weakness.  ?Hematological:  Negative for adenopathy.  ?Psychiatric/Behavioral:  Negative for confusion.   ? ?MEDICAL HISTORY:  ?Past Medical History:  ?Diagnosis Date  ? Allergy   ? seasonal allergies  ? Anemia   ? hx of IDA  ? Anxiety   ? on meds  ? Arthritis   ? PRN meds-osteoarthritis  ? Cataract   ? sx to remove  ? Chronic kidney  disease   ? CKD-stage 3  ? Chronic pain   ? COVID-19 06/02/2020  ? Depression   ? on meds  ? Diabetes type 2, controlled ([place])   ? on meds  ? Fibromyalgia   ? GERD (gastroesophageal reflux disease)   ? on meds  ? Headache   ? History of colonic polyps 1999  ? Benign  ? Hx of adenomatous polyp of colon 09/27/2000  ? 2001 - diminutive adenoma [place] 2007 no polyps - [place]  ? Hyperlipidemia   ? on meds  ? Hypertension   ? on meds  ? Hypothyroidism   ? not on meds at this time  ? IBS (irritable bowel syndrome)   ? Diarrhea type  ? Neuromuscular disorder ([place])   ? Vitamin D deficiency   ? ? ?SURGICAL HISTORY: ?Past Surgical History:  ?Procedure Laterality Date  ? CHOLECYSTECTOMY  1995  ? COLONOSCOPY  multiple  ? COLONOSCOPY  2016  ? TA's  ? NASAL SINUS SURGERY  2001  ? POLYPECTOMY    ? TA's  ? TONSILLECTOMY AND ADENOIDECTOMY    ? VAGINAL HYSTERECTOMY    ? Complete  ? WISDOM TOOTH EXTRACTION    ? ? ?SOCIAL HISTORY: ?Social History  ? ?Socioeconomic History  ? Marital status: Married  ?  Spouse name: Not on file  ? Number of children: Not on file  ? Years of education: Not on file  ? Highest education level: Not on file  ?Occupational History  ? Not on file  ?Tobacco Use  ? Smoking status:  Never  ? Smokeless tobacco: Never  ?Vaping Use  ? Vaping Use: Never used  ?Substance and Sexual Activity  ? Alcohol use: Yes  ?  Alcohol/week: 0.0 standard drinks  ?  Comment: rarely  ? Drug use: No  ? Sexual activity: Not on file  ?Other Topics Concern  ? Not on file  ?Social History Narrative  ? Married.  ? 3 children.  ? Retired, worked at [employer] in [place], [place].  ? Enjoys being with her grandchildren, being with her friends.  ? ?Social Determinants of Health  ? ?Financial Resource Strain: Low Risk   ? Difficulty of Paying Living Expenses: Not hard at all  ?Food Insecurity: No Food Insecurity  ? Worried About Charity fundraiser in the Last Year: Never true  ? Ran Out of Food in the Last Year: Never true  ?Transportation Needs:  No Transportation Needs  ? Lack of Transportation (Medical): No  ? Lack of Transportation (Non-Medical): No  ?Physical Activity: Inactive  ? Days of Exercise per Week: 0 days  ? Minutes of Exercise per Session: 0 min  ?Stress: No Stress Concern Present  ? Feeling of Stress : Not at all  ?Social Connections: Not on file  ?Intimate Partner Violence: Not At Risk  ? Fear of Current or Ex-Partner: No  ? Emotionally Abused: No  ? Physically Abused: No  ? Sexually Abused: No  ? ? ?FAMILY HISTORY: ?Family History  ?Problem Relation Age of Onset  ? Colon cancer Mother 49  ? Lung cancer Mother 69  ? Skin cancer Mother   ? Colon polyps Mother 47  ? Heart disease Father   ? Diabetes Father   ? Skin cancer Father   ? Colon polyps Brother 52  ? Stomach cancer Paternal Grandmother 52  ? Breast cancer Neg Hx   ? Esophageal cancer Neg Hx   ? Rectal cancer Neg Hx   ? ? ?ALLERGIES:  has No Known Allergies. ? ?MEDICATIONS:  ?Current Outpatient Medications  ?Medication Sig Dispense Refill  ? Accu-Chek FastClix Lancets MISC USE AS INSTRUCTED TO CHECK BLOOD SUGAR UP TO 3 TIMES DAILY 306 each 1  ? amLODipine (NORVASC) 10 MG tablet Take 1 tablet (10 mg total) by mouth daily. for blood pressure 90 tablet 3  ? atorvastatin (LIPITOR) 40 MG tablet TAKE ONE TABLET BY MOUTH AT BEDTIME FOR CHOLESTEROL 90 tablet 3  ? Cholecalciferol (VITAMIN D3) 125 MCG (5000 UT) CAPS Take 5,000 Units by mouth daily.    ? DULoxetine (CYMBALTA) 30 MG capsule Take 1 capsule (30 mg total) by mouth daily. For pain, anxiety, depression. 90 capsule 0  ? glucose blood (ACCU-CHEK GUIDE) test strip USE UP TO FOUR TIMES DAILY TO CHECK BLOOD SUGAR (E10.9 E11.9) 200 each 5  ? Insulin Glargine-Lixisenatide (SOLIQUA) 100-33 UNT-MCG/ML SOPN Inject 25 Units into the skin 2 (two) times daily. For diabetes. 45 mL 1  ? Insulin Pen Needle 32G X 6 MM MISC Use as instructed to inject insulin twice daily 200 each 3  ? loperamide (IMODIUM A-D) 2 MG tablet Take 2 mg by mouth 4 (four) times  daily as needed for diarrhea or loose stools.    ? losartan (COZAAR) 100 MG tablet Take 1 tablet (100 mg total) by mouth daily. for blood pressure 90 tablet 3  ? Magnesium 200 MG TABS Take by mouth daily.    ? metFORMIN (GLUCOPHAGE-XR) 500 MG 24 hr tablet Take 4 tablets (2,000 mg total) by mouth daily with breakfast. For  diabetes. 360 tablet 1  ? Multiple Vitamins-Minerals (CENTRUM SILVER ULTRA WOMENS PO) Take by mouth daily.    ? oxyCODONE (OXY IR/ROXICODONE) 5 MG immediate release tablet     ? pantoprazole (PROTONIX) 40 MG tablet     ? promethazine (PHENERGAN) 25 MG tablet Take 25 mg by mouth every 6 (six) hours as needed. Reported on 09/21/2015  1  ? ?No current facility-administered medications for this visit.  ? ? ? ?PHYSICAL EXAMINATION: ?ECOG PERFORMANCE STATUS: 1 - Symptomatic but completely ambulatory ?Vitals:  ? 02/09/22 1120  ?BP: (!) 156/78  ?Pulse: 76  ?Temp: (!) 96.4 ?F (35.8 ?C)  ? ?Filed Weights  ? 02/09/22 1120  ?Weight: 258 lb (117 kg)  ? ? ?Physical Exam ?Constitutional:   ?   General: She is not in acute distress. ?   Appearance: She is obese.  ?HENT:  ?   Head: Normocephalic and atraumatic.  ?Eyes:  ?   General: No scleral icterus. ?Cardiovascular:  ?   Rate and Rhythm: Normal rate and regular rhythm.  ?   Heart sounds: Normal heart sounds.  ?Pulmonary:  ?   Effort: Pulmonary effort is normal. No respiratory distress.  ?   Breath sounds: No wheezing.  ?Abdominal:  ?   General: Bowel sounds are normal. There is no distension.  ?   Palpations: Abdomen is soft.  ?Musculoskeletal:     ?   General: No deformity. Normal range of motion.  ?   Cervical back: Normal range of motion and neck supple.  ?Skin: ?   General: Skin is warm and dry.  ?   Findings: No erythema or rash.  ?Neurological:  ?   Mental Status: She is alert and oriented to person, place, and time. Mental status is at baseline.  ?   Cranial Nerves: No cranial nerve deficit.  ?   Coordination: Coordination normal.  ?Psychiatric:     ?    Mood and Affect: Mood normal.  ? ? ?LABORATORY DATA:  ?I have reviewed the data as listed ?Lab Results  ?Component Value Date  ? WBC 9.1 02/09/2022  ? HGB 11.0 (L) 02/09/2022  ? HCT 34.7 (L) 02/09/2022  ? M

## 2022-02-10 ENCOUNTER — Telehealth: Payer: Self-pay

## 2022-02-10 LAB — KAPPA/LAMBDA LIGHT CHAINS
Kappa free light chain: 98.6 mg/L — ABNORMAL HIGH (ref 3.3–19.4)
Kappa, lambda light chain ratio: 2.04 — ABNORMAL HIGH (ref 0.26–1.65)
Lambda free light chains: 48.4 mg/L — ABNORMAL HIGH (ref 5.7–26.3)

## 2022-02-10 NOTE — Telephone Encounter (Signed)
-----   Message from Earlie Server, MD sent at 02/09/2022  9:33 PM EDT ----- ?Please let patient know that her iron level is low.  I recommend IV Venofer treatments weekly x4.  Please schedule.  Follow-up plan, lab in 3 months, 2 to 3 days prior to MD +/- Venofer.  Lab ordered. ?

## 2022-02-10 NOTE — Telephone Encounter (Signed)
Called and left detailed message informing patient of lab results and Dr. Collie Siad recommendation for IV venofer weekly x4. Advised to call back with any questions.  ? ? ?Please schedule patient for: ?venofer weekly x 4 ?Labs 3 motnhs ?MD/+/- venofer 2 days after.  ?Please notify patient of appt. Thanks  ?

## 2022-02-11 ENCOUNTER — Telehealth: Payer: Self-pay | Admitting: Oncology

## 2022-02-11 NOTE — Telephone Encounter (Signed)
Please advise 

## 2022-02-11 NOTE — Telephone Encounter (Signed)
Pt is calling about her results. She viewed them online and would like someone to follow-up with her. Pt is very concerned about the levels of the Kappa Light Chain. ?

## 2022-02-11 NOTE — Telephone Encounter (Signed)
Called and spoke with patient informing her Per Dr. Tasia Catchings, kappa light chain is nonspecific. We are still waiting on labs to result to further evaluate. Advised that we would call her with results and plan once Dr. Tasia Catchings reviews final labs results. Patient verified she would keep iron infusion appt as well.  ?

## 2022-02-14 LAB — MULTIPLE MYELOMA PANEL, SERUM
Albumin SerPl Elph-Mcnc: 3.8 g/dL (ref 2.9–4.4)
Albumin/Glob SerPl: 1.2 (ref 0.7–1.7)
Alpha 1: 0.2 g/dL (ref 0.0–0.4)
Alpha2 Glob SerPl Elph-Mcnc: 0.8 g/dL (ref 0.4–1.0)
B-Globulin SerPl Elph-Mcnc: 1 g/dL (ref 0.7–1.3)
Gamma Glob SerPl Elph-Mcnc: 1.3 g/dL (ref 0.4–1.8)
Globulin, Total: 3.3 g/dL (ref 2.2–3.9)
IgA: 272 mg/dL (ref 87–352)
IgG (Immunoglobin G), Serum: 1345 mg/dL (ref 586–1602)
IgM (Immunoglobulin M), Srm: 123 mg/dL (ref 26–217)
Total Protein ELP: 7.1 g/dL (ref 6.0–8.5)

## 2022-02-15 ENCOUNTER — Inpatient Hospital Stay: Payer: Medicare HMO

## 2022-02-15 ENCOUNTER — Telehealth: Payer: Self-pay | Admitting: Oncology

## 2022-02-15 VITALS — BP 147/59 | HR 78 | Temp 98.0°F | Resp 18

## 2022-02-15 DIAGNOSIS — R5383 Other fatigue: Secondary | ICD-10-CM | POA: Diagnosis not present

## 2022-02-15 DIAGNOSIS — Z79899 Other long term (current) drug therapy: Secondary | ICD-10-CM | POA: Diagnosis not present

## 2022-02-15 DIAGNOSIS — Z8616 Personal history of COVID-19: Secondary | ICD-10-CM | POA: Diagnosis not present

## 2022-02-15 DIAGNOSIS — M129 Arthropathy, unspecified: Secondary | ICD-10-CM | POA: Diagnosis not present

## 2022-02-15 DIAGNOSIS — D509 Iron deficiency anemia, unspecified: Secondary | ICD-10-CM | POA: Diagnosis not present

## 2022-02-15 DIAGNOSIS — E119 Type 2 diabetes mellitus without complications: Secondary | ICD-10-CM | POA: Diagnosis not present

## 2022-02-15 DIAGNOSIS — E559 Vitamin D deficiency, unspecified: Secondary | ICD-10-CM | POA: Diagnosis not present

## 2022-02-15 DIAGNOSIS — D508 Other iron deficiency anemias: Secondary | ICD-10-CM

## 2022-02-15 DIAGNOSIS — K219 Gastro-esophageal reflux disease without esophagitis: Secondary | ICD-10-CM | POA: Diagnosis not present

## 2022-02-15 DIAGNOSIS — N1831 Chronic kidney disease, stage 3a: Secondary | ICD-10-CM | POA: Diagnosis not present

## 2022-02-15 MED ORDER — SODIUM CHLORIDE 0.9 % IV SOLN
Freq: Once | INTRAVENOUS | Status: AC
  Filled 2022-02-15: qty 250

## 2022-02-15 MED ORDER — SODIUM CHLORIDE 0.9 % IV SOLN: 200.0000 mg | Freq: Once | INTRAVENOUS | Status: DC

## 2022-02-15 MED ORDER — IRON SUCROSE 20 MG/ML IV SOLN
200.0000 mg | Freq: Once | INTRAVENOUS | Status: AC
  Administered 2022-02-15: 200 mg via INTRAVENOUS
  Filled 2022-02-15: qty 10

## 2022-02-15 NOTE — Telephone Encounter (Signed)
Please advise 

## 2022-02-15 NOTE — Telephone Encounter (Signed)
Pt would like to be called in regards to lab results, she will be in infu today also if someoene would like to speak with her .Marland KitchenMarland KitchenKJ ?

## 2022-02-15 NOTE — Telephone Encounter (Signed)
Pt has been informed and 24 hr urine jug has been given to pt.  ?

## 2022-02-16 ENCOUNTER — Other Ambulatory Visit: Payer: Self-pay

## 2022-02-16 DIAGNOSIS — D509 Iron deficiency anemia, unspecified: Secondary | ICD-10-CM | POA: Diagnosis not present

## 2022-02-16 DIAGNOSIS — Z8616 Personal history of COVID-19: Secondary | ICD-10-CM | POA: Diagnosis not present

## 2022-02-16 DIAGNOSIS — R1084 Generalized abdominal pain: Secondary | ICD-10-CM

## 2022-02-16 DIAGNOSIS — R5383 Other fatigue: Secondary | ICD-10-CM | POA: Diagnosis not present

## 2022-02-16 DIAGNOSIS — N1831 Chronic kidney disease, stage 3a: Secondary | ICD-10-CM | POA: Diagnosis not present

## 2022-02-16 DIAGNOSIS — Z79899 Other long term (current) drug therapy: Secondary | ICD-10-CM | POA: Diagnosis not present

## 2022-02-16 DIAGNOSIS — E559 Vitamin D deficiency, unspecified: Secondary | ICD-10-CM | POA: Diagnosis not present

## 2022-02-16 DIAGNOSIS — R309 Painful micturition, unspecified: Secondary | ICD-10-CM

## 2022-02-16 DIAGNOSIS — K219 Gastro-esophageal reflux disease without esophagitis: Secondary | ICD-10-CM | POA: Diagnosis not present

## 2022-02-16 DIAGNOSIS — M129 Arthropathy, unspecified: Secondary | ICD-10-CM | POA: Diagnosis not present

## 2022-02-16 DIAGNOSIS — E119 Type 2 diabetes mellitus without complications: Secondary | ICD-10-CM | POA: Diagnosis not present

## 2022-02-17 ENCOUNTER — Inpatient Hospital Stay: Payer: Medicare HMO

## 2022-02-17 DIAGNOSIS — R309 Painful micturition, unspecified: Secondary | ICD-10-CM

## 2022-02-17 DIAGNOSIS — E559 Vitamin D deficiency, unspecified: Secondary | ICD-10-CM | POA: Diagnosis not present

## 2022-02-17 DIAGNOSIS — E119 Type 2 diabetes mellitus without complications: Secondary | ICD-10-CM | POA: Diagnosis not present

## 2022-02-17 DIAGNOSIS — Z79899 Other long term (current) drug therapy: Secondary | ICD-10-CM | POA: Diagnosis not present

## 2022-02-17 DIAGNOSIS — M129 Arthropathy, unspecified: Secondary | ICD-10-CM | POA: Diagnosis not present

## 2022-02-17 DIAGNOSIS — K219 Gastro-esophageal reflux disease without esophagitis: Secondary | ICD-10-CM | POA: Diagnosis not present

## 2022-02-17 DIAGNOSIS — R5383 Other fatigue: Secondary | ICD-10-CM | POA: Diagnosis not present

## 2022-02-17 DIAGNOSIS — R1084 Generalized abdominal pain: Secondary | ICD-10-CM

## 2022-02-17 DIAGNOSIS — Z8616 Personal history of COVID-19: Secondary | ICD-10-CM | POA: Diagnosis not present

## 2022-02-17 DIAGNOSIS — N1831 Chronic kidney disease, stage 3a: Secondary | ICD-10-CM | POA: Diagnosis not present

## 2022-02-17 DIAGNOSIS — D509 Iron deficiency anemia, unspecified: Secondary | ICD-10-CM | POA: Diagnosis not present

## 2022-02-17 LAB — URINALYSIS, COMPLETE (UACMP) WITH MICROSCOPIC
Bilirubin Urine: NEGATIVE
Glucose, UA: NEGATIVE mg/dL
Hgb urine dipstick: NEGATIVE
Ketones, ur: NEGATIVE mg/dL
Nitrite: NEGATIVE
Protein, ur: NEGATIVE mg/dL
Specific Gravity, Urine: 1.015 (ref 1.005–1.030)
pH: 5 (ref 5.0–8.0)

## 2022-02-18 ENCOUNTER — Telehealth: Payer: Self-pay

## 2022-02-18 LAB — URINE CULTURE: Culture: 50000 — AB

## 2022-02-18 NOTE — Telephone Encounter (Signed)
-----   Message from Charlotte Sanes sent at 02/17/2022  4:24 PM EDT ----- Regarding: lab Patient called to ask if the urine results from today show she needs antibiotics. Please advise.  Thank you

## 2022-02-18 NOTE — Telephone Encounter (Signed)
Called and spoke with patient . Advised that Dr. Tasia Catchings is waiting for UC to result to further evaluate. Informed patient that we contact her with results once they have resulted.

## 2022-02-21 ENCOUNTER — Other Ambulatory Visit: Payer: Self-pay | Admitting: Oncology

## 2022-02-21 ENCOUNTER — Telehealth: Payer: Self-pay | Admitting: *Deleted

## 2022-02-21 LAB — IFE+PROTEIN ELECTRO, 24-HR UR
% BETA, Urine: 0 %
ALPHA 1 URINE: 0 %
Albumin, U: 100 %
Alpha 2, Urine: 0 %
GAMMA GLOBULIN URINE: 0 %
Total Protein, Urine-Ur/day: 74 mg/24 hr (ref 30–150)
Total Protein, Urine: 5.5 mg/dL
Total Volume: 1350

## 2022-02-21 MED ORDER — CIPROFLOXACIN HCL 250 MG PO TABS: 250.0000 mg | ORAL_TABLET | Freq: Two times a day (BID) | ORAL | 0 refills | Status: DC

## 2022-02-21 NOTE — Telephone Encounter (Signed)
Patient called re: her UA and urine culture results. She has had increasing pain over the weekend in her back, side and groin. Does she need an antibiotic? Does she need to make an appointment with the kidney doctor? Please advise. [phone]

## 2022-02-22 ENCOUNTER — Encounter: Payer: Self-pay | Admitting: Oncology

## 2022-02-22 ENCOUNTER — Inpatient Hospital Stay: Payer: Medicare HMO

## 2022-02-22 VITALS — BP 137/62 | HR 73 | Temp 97.6°F | Resp 16

## 2022-02-22 DIAGNOSIS — R5383 Other fatigue: Secondary | ICD-10-CM | POA: Diagnosis not present

## 2022-02-22 DIAGNOSIS — Z79899 Other long term (current) drug therapy: Secondary | ICD-10-CM | POA: Diagnosis not present

## 2022-02-22 DIAGNOSIS — M129 Arthropathy, unspecified: Secondary | ICD-10-CM | POA: Diagnosis not present

## 2022-02-22 DIAGNOSIS — Z8616 Personal history of COVID-19: Secondary | ICD-10-CM | POA: Diagnosis not present

## 2022-02-22 DIAGNOSIS — K219 Gastro-esophageal reflux disease without esophagitis: Secondary | ICD-10-CM | POA: Diagnosis not present

## 2022-02-22 DIAGNOSIS — N1831 Chronic kidney disease, stage 3a: Secondary | ICD-10-CM | POA: Diagnosis not present

## 2022-02-22 DIAGNOSIS — E119 Type 2 diabetes mellitus without complications: Secondary | ICD-10-CM | POA: Diagnosis not present

## 2022-02-22 DIAGNOSIS — D509 Iron deficiency anemia, unspecified: Secondary | ICD-10-CM | POA: Diagnosis not present

## 2022-02-22 DIAGNOSIS — E559 Vitamin D deficiency, unspecified: Secondary | ICD-10-CM | POA: Diagnosis not present

## 2022-02-22 DIAGNOSIS — D508 Other iron deficiency anemias: Secondary | ICD-10-CM

## 2022-02-22 MED ORDER — SODIUM CHLORIDE 0.9 % IV SOLN: 200.0000 mg | Freq: Once | INTRAVENOUS | Status: DC

## 2022-02-22 MED ORDER — IRON SUCROSE 20 MG/ML IV SOLN
200.0000 mg | Freq: Once | INTRAVENOUS | Status: AC
  Administered 2022-02-22: 200 mg via INTRAVENOUS
  Filled 2022-02-22: qty 10

## 2022-02-22 MED ORDER — SODIUM CHLORIDE 0.9 % IV SOLN
Freq: Once | INTRAVENOUS | Status: AC
  Filled 2022-02-22: qty 250

## 2022-02-22 NOTE — Telephone Encounter (Signed)
Patient informed and verbalized understanding

## 2022-02-23 DIAGNOSIS — M5416 Radiculopathy, lumbar region: Secondary | ICD-10-CM | POA: Diagnosis not present

## 2022-02-23 DIAGNOSIS — M5382 Other specified dorsopathies, cervical region: Secondary | ICD-10-CM | POA: Diagnosis not present

## 2022-02-23 DIAGNOSIS — M545 Low back pain, unspecified: Secondary | ICD-10-CM | POA: Diagnosis not present

## 2022-02-23 DIAGNOSIS — M5412 Radiculopathy, cervical region: Secondary | ICD-10-CM | POA: Diagnosis not present

## 2022-02-24 ENCOUNTER — Ambulatory Visit (INDEPENDENT_AMBULATORY_CARE_PROVIDER_SITE_OTHER): Payer: Medicare HMO

## 2022-02-24 VITALS — Wt 258.0 lb

## 2022-02-24 DIAGNOSIS — Z0000 Encounter for general adult medical examination without abnormal findings: Secondary | ICD-10-CM

## 2022-02-24 NOTE — Progress Notes (Signed)
cc Virtual Visit via Telephone Note  I connected with  Danielle Danielle on 02/24/22 at  2:00 PM EDT by telephone and verified that I am speaking with the correct person using two identifiers.  Location: Patient: home Provider: [place] Persons participating in the virtual visit: Danielle Edwards   I discussed the limitations, risks, security and privacy concerns of performing an evaluation and management service by telephone and the availability of in person appointments. The patient expressed understanding and agreed to proceed.  Interactive audio and video telecommunications were attempted between this nurse and patient, however failed, due to patient having technical difficulties OR patient did not have access to video capability.  We continued and completed visit with audio only.  Some vital signs may be absent or patient reported.   Danielle David, LPN  Subjective:   Danielle Danielle is a 66 y.o. female who presents for Medicare Annual (Subsequent) preventive examination.  Review of Systems           Objective:    There were no vitals filed for this visit. There is no height or weight on file to calculate BMI.     02/08/2022    4:13 PM 02/23/2021    1:28 PM 07/17/2020    1:08 PM 09/12/2019   10:36 AM 08/17/2015    9:31 AM  Advanced Directives  Does Patient Have a Medical Advance Directive? No No No No No  Does patient want to make changes to medical advance directive?  No - Patient declined     Would patient like information on creating a medical advance directive?   No - Patient declined No - Patient declined     Current Medications (verified) Outpatient Encounter Medications as of 02/24/2022  Medication Sig   Accu-Chek FastClix Lancets MISC USE AS INSTRUCTED TO CHECK BLOOD SUGAR UP TO 3 TIMES DAILY   amLODipine (NORVASC) 10 MG tablet Take 1 tablet (10 mg total) by mouth daily. for blood pressure   atorvastatin (LIPITOR) 40 MG tablet TAKE ONE TABLET  BY MOUTH AT BEDTIME FOR CHOLESTEROL   Cholecalciferol (VITAMIN D3) 125 MCG (5000 UT) CAPS Take 5,000 Units by mouth daily.   ciprofloxacin (CIPRO) 250 MG tablet Take 1 tablet (250 mg total) by mouth 2 (two) times daily.   DULoxetine (CYMBALTA) 30 MG capsule Take 1 capsule (30 mg total) by mouth daily. For pain, anxiety, depression.   glucose blood (ACCU-CHEK GUIDE) test strip USE UP TO FOUR TIMES DAILY TO CHECK BLOOD SUGAR (E10.9 E11.9)   Insulin Glargine-Lixisenatide (SOLIQUA) 100-33 UNT-MCG/ML SOPN Inject 25 Units into the skin 2 (two) times daily. For diabetes.   Insulin Pen Needle 32G X 6 MM MISC Use as instructed to inject insulin twice daily   loperamide (IMODIUM A-D) 2 MG tablet Take 2 mg by mouth 4 (four) times daily as needed for diarrhea or loose stools.   losartan (COZAAR) 100 MG tablet Take 1 tablet (100 mg total) by mouth daily. for blood pressure   Magnesium 200 MG TABS Take by mouth daily.   metFORMIN (GLUCOPHAGE-XR) 500 MG 24 hr tablet Take 4 tablets (2,000 mg total) by mouth daily with breakfast. For diabetes.   Multiple Vitamins-Minerals (CENTRUM SILVER ULTRA WOMENS PO) Take by mouth daily.   oxyCODONE (OXY IR/ROXICODONE) 5 MG immediate release tablet    pantoprazole (PROTONIX) 40 MG tablet    promethazine (PHENERGAN) 25 MG tablet Take 25 mg by mouth every 6 (six) hours as needed. Reported on 09/21/2015  No facility-administered encounter medications on file as of 02/24/2022.    Allergies (verified) Patient has no known allergies.   History: Past Medical History:  Diagnosis Date   Allergy    seasonal allergies   Anemia    hx of IDA   Anxiety    on meds   Arthritis    PRN meds-osteoarthritis   Cataract    sx to remove   Chronic kidney disease    CKD-stage 3   Chronic pain    COVID-19 06/02/2020   Depression    on meds   Diabetes type 2, controlled ([place])    on meds   Fibromyalgia    GERD (gastroesophageal reflux disease)    on meds   Headache    History  of colonic polyps 1999   Benign   Hx of adenomatous polyp of colon 09/27/2000   2001 - diminutive adenoma [place] 2007 no polyps - [place]   Hyperlipidemia    on meds   Hypertension    on meds   Hypothyroidism    not on meds at this time   IBS (irritable bowel syndrome)    Diarrhea type   IDA (iron deficiency anemia) 02/09/2022   Neuromuscular disorder (HCC)    Vitamin D deficiency    Past Surgical History:  Procedure Laterality Date   CHOLECYSTECTOMY  1995   COLONOSCOPY  multiple   COLONOSCOPY  2016   TA's   NASAL SINUS SURGERY  2001   POLYPECTOMY     TA's   TONSILLECTOMY AND ADENOIDECTOMY     VAGINAL HYSTERECTOMY     Complete   WISDOM TOOTH EXTRACTION     Family History  Problem Relation Age of Onset   Colon cancer Mother 23   Lung cancer Mother 24   Skin cancer Mother    Colon polyps Mother 17   Heart disease Father    Diabetes Father    Skin cancer Father    Colon polyps Brother 31   Stomach cancer Paternal Grandmother 20   Breast cancer Neg Hx    Esophageal cancer Neg Hx    Rectal cancer Neg Hx    Social History   Socioeconomic History   Marital status: Married    Spouse name: Not on file   Number of children: Not on file   Years of education: Not on file   Highest education level: Not on file  Occupational History   Not on file  Tobacco Use   Smoking status: Never   Smokeless tobacco: Never  Vaping Use   Vaping Use: Never used  Substance and Sexual Activity   Alcohol use: Yes    Alcohol/week: 0.0 standard drinks    Comment: rarely   Drug use: No   Sexual activity: Not on file  Other Topics Concern   Not on file  Social History Narrative   Married.   3 children.   Retired, worked at [employer] in [place], [place].   Enjoys being with her grandchildren, being with her friends.   Social Determinants of Health   Financial Resource Strain: Low Risk    Difficulty of Paying Living Expenses: Not hard at all  Food Insecurity: No Food Insecurity    Worried About Charity fundraiser in the Last Year: Never true   [place] in the Last Year: Never true  Transportation Needs: No Transportation Needs   Lack of Transportation (Medical): No   Lack of Transportation (Non-Medical): No  Physical Activity: Inactive  Days of Exercise per Week: 0 days   Minutes of Exercise per Session: 0 min  Stress: No Stress Concern Present   Feeling of Stress : Not at all  Social Connections: Not on file    Tobacco Counseling Counseling given: Not Answered   Clinical Intake:  Pre-visit preparation completed: Yes  Pain : No/denies pain     Diabetes: Yes CBG done?: No Did pt. bring in CBG monitor from home?: No  How often do you need to have someone help you when you read instructions, pamphlets, or other written materials from your doctor or pharmacy?: 1 - Never  Diabetic?yes Nutrition Risk Assessment:  Has the patient had any N/V/D within the last 2 months?  Yes  Does the patient have any non-healing wounds?  No  Has the patient had any unintentional weight loss or weight gain?  No   Diabetes:  Is the patient diabetic?  Yes  If diabetic, was a CBG obtained today?  No  Did the patient bring in their glucometer from home?  No  How often do you monitor your CBG's? All day   Financial Strains and Diabetes Management:  Are you having any financial strains with the device, your supplies or your medication? No .  Does the patient want to be seen by Chronic Care Management for management of their diabetes?  No  Would the patient like to be referred to a Nutritionist or for Diabetic Management?  No   Diabetic Exams:  Diabetic Eye Exam: Completed 02/26/21. Pt has been advised about the importance in completing this exam.   Diabetic Foot Exam: Completed 05/26/21. Pt has been advised about the importance in completing this exam.    Interpreter Needed?: No  Information entered by :: Kirke Shaggy, LPN   Activities of Daily Living      View : No data to display.          Patient Care Team: Pleas Koch, NP as PCP - General (Nurse Practitioner) Charlton Haws, [facility] as Pharmacist (Pharmacist)  Indicate any recent Medical Services you may have received from other than [facility] providers in the past year (date may be approximate).     Assessment:   This is a routine wellness examination for Danielle Danielle.  Hearing/Vision screen No results found.  Dietary issues and exercise activities discussed:     Goals Addressed   None    Depression Screen    02/23/2021    1:32 PM 12/08/2020    1:27 PM 09/12/2019   10:39 AM  PHQ 2/9 Scores  PHQ - 2 Score 0 1 0  PHQ- 9 Score 0  0    Fall Risk    02/23/2021    1:30 PM 12/25/2020   10:50 AM 09/12/2019   10:38 AM  Fall Risk   Falls in the past year? 0 0 0  Number falls in past yr: 0 0 0  Injury with Fall? 0 0 0  Risk for fall due to : Medication side effect  Medication side effect  Follow up Falls evaluation completed;Falls prevention discussed  Falls evaluation completed;Falls prevention discussed    FALL RISK PREVENTION PERTAINING TO THE HOME:  Any stairs in or around the home? Yes  If so, are there any without handrails? No  Home free of loose throw rugs in walkways, pet beds, electrical cords, etc? Yes  Adequate lighting in your home to reduce risk of falls? Yes   ASSISTIVE DEVICES UTILIZED TO PREVENT FALLS:  Life  alert? No  Use of a cane, walker or w/c? No  Grab bars in the bathroom? No  Shower chair or bench in shower? No  Elevated toilet seat or a handicapped toilet? Yes    Cognitive Function:  declined test       02/23/2021    1:35 PM 09/12/2019   10:41 AM  MMSE - Mini Mental State Exam  Not completed: Refused   Orientation to time  5  Orientation to Place  5  Registration  3  Attention/ Calculation  5  Recall  3  Language- repeat  1        Immunizations Immunization History  Administered Date(s) Administered   DTaP  12/01/2013   Fluad Quad(high Dose 65+) 07/21/2020, 07/16/2021   Influenza Inj Mdck Quad Pf 11/03/2017   Influenza,inj,Quad PF,6+ Mos 06/29/2015, 05/16/2018, 09/11/2019   Influenza-Unspecified 09/11/2019   Janssen (J&J) SARS-COV-2 Vaccination 12/11/2019   PFIZER(Purple Top)SARS-COV-2 Vaccination 09/19/2020   PNEUMOCOCCAL CONJUGATE-20 05/26/2021   Pneumococcal Polysaccharide-23 04/13/2015   Zoster Recombinat (Shingrix) 05/21/2018, 09/12/2018    TDAP status: Due, Education has been provided regarding the importance of this vaccine. Advised may receive this vaccine at local pharmacy or Health Dept. Aware to provide a copy of the vaccination record if obtained from local pharmacy or Health Dept. Verbalized acceptance and understanding.  Flu Vaccine status: Declined, Education has been provided regarding the importance of this vaccine but patient still declined. Advised may receive this vaccine at local pharmacy or Health Dept. Aware to provide a copy of the vaccination record if obtained from local pharmacy or Health Dept. Verbalized acceptance and understanding.  Pneumococcal vaccine status: Up to date  Covid-19 vaccine status: Completed vaccines  Qualifies for Shingles Vaccine? Yes   Zostavax completed No   Shingrix Completed?: Yes  Screening Tests Health Maintenance  Topic Date Due   TETANUS/TDAP  Never done   COVID-19 Vaccine (3 - Booster for Janssen series) 11/14/2020   OPHTHALMOLOGY EXAM  02/26/2022   INFLUENZA VACCINE  05/03/2022   HEMOGLOBIN A1C  05/25/2022   FOOT EXAM  05/26/2022   COLONOSCOPY (Pts 45-39yr Insurance coverage will need to be confirmed)  07/18/2023   MAMMOGRAM  02/03/2024   Pneumonia Vaccine 66 Years old  Completed   DEXA SCAN  Completed   Hepatitis C Screening  Completed   Zoster Vaccines- Shingrix  Completed   HPV VACCINES  Aged Out    Health Maintenance  Health Maintenance Due  Topic Date Due   TETANUS/TDAP  Never done   COVID-19 Vaccine (3 -  Booster for Janssen series) 11/14/2020    Colorectal cancer screening: Type of screening: Colonoscopy. Completed 07/17/20. Repeat every 10 years  Mammogram status: Completed 02/02/22. Repeat every year  Bone Density status: Completed 06/22/15. Results reflect: Bone density results: NORMAL. Repeat every 5 years.- declined referral  Lung Cancer Screening: (Low Dose CT Chest recommended if Age 67-80years, 30 pack-year currently smoking OR have quit w/in 15years.) does not qualify.   Additional Screening:  Hepatitis C Screening: does not qualify; Completed 08/05/16  Vision Screening: Recommended annual ophthalmology exams for early detection of glaucoma and other disorders of the eye. Is the patient up to date with their annual eye exam?  Yes  Who is the provider or what is the name of the office in which the patient attends annual eye exams? Dr.Gross If pt is not established with a provider, would they like to be referred to a provider to establish care? No .  Dental Screening: Recommended annual dental exams for proper oral hygiene  Community Resource Referral / Chronic Care Management: CRR required this visit?  No   CCM required this visit?  No      Plan:     I have personally reviewed and noted the following in the patient's chart:   Medical and social history Use of alcohol, tobacco or illicit drugs  Current medications and supplements including opioid prescriptions.  Functional ability and status Nutritional status Physical activity Advanced directives List of other physicians Hospitalizations, surgeries, and ER visits in previous 12 months Vitals Screenings to include cognitive, depression, and falls Referrals and appointments  In addition, I have reviewed and discussed with patient certain preventive protocols, quality metrics, and best practice recommendations. A written personalized care plan for preventive services as well as general preventive health  recommendations were provided to patient.     Danielle David, LPN   [birth year]   Nurse Notes: none

## 2022-02-24 NOTE — Patient Instructions (Signed)
Danielle Edwards , Thank you for taking time to come for your Medicare Wellness Visit. I appreciate your ongoing commitment to your health goals. Please review the following plan we discussed and let me know if I can assist you in the future.   Screening recommendations/referrals: Colonoscopy: 07/17/20 Mammogram: 02/02/22 Bone Density: 06/22/15 Recommended yearly ophthalmology/optometry visit for glaucoma screening and checkup Recommended yearly dental visit for hygiene and checkup  Vaccinations: Influenza vaccine: n/c Pneumococcal vaccine: 05/26/21 Tdap vaccine: n/d Shingles vaccine: Shingrix 05/21/18, 09/12/18   Covid-19:12/11/19, 09/19/20  Advanced directives: no  Conditions/risks identified: none  Next appointment: Follow up in one year for your annual wellness visit 02/28/23 @ 11:15 am by phone   Preventive Care 65 Years and Older, Female Preventive care refers to lifestyle choices and visits with your health care provider that can promote health and wellness. What does preventive care include? A yearly physical exam. This is also called an annual well check. Dental exams once or twice a year. Routine eye exams. Ask your health care provider how often you should have your eyes checked. Personal lifestyle choices, including: Daily care of your teeth and gums. Regular physical activity. Eating a healthy diet. Avoiding tobacco and drug use. Limiting alcohol use. Practicing safe sex. Taking low-dose aspirin every day. Taking vitamin and mineral supplements as recommended by your health care provider. What happens during an annual well check? The services and screenings done by your health care provider during your annual well check will depend on your age, overall health, lifestyle risk factors, and family history of disease. Counseling  Your health care provider may ask you questions about your: Alcohol use. Tobacco use. Drug use. Emotional well-being. Home and relationship  well-being. Sexual activity. Eating habits. History of falls. Memory and ability to understand (cognition). Work and work Statistician. Reproductive health. Screening  You may have the following tests or measurements: Height, weight, and BMI. Blood pressure. Lipid and cholesterol levels. These may be checked every 5 years, or more frequently if you are over 58 years old. Skin check. Lung cancer screening. You may have this screening every year starting at age 57 if you have a 30-pack-year history of smoking and currently smoke or have quit within the past 15 years. Fecal occult blood test (FOBT) of the stool. You may have this test every year starting at age 20. Flexible sigmoidoscopy or colonoscopy. You may have a sigmoidoscopy every 5 years or a colonoscopy every 10 years starting at age 5. Hepatitis C blood test. Hepatitis B blood test. Sexually transmitted disease (STD) testing. Diabetes screening. This is done by checking your blood sugar (glucose) after you have not eaten for a while (fasting). You may have this done every 1-3 years. Bone density scan. This is done to screen for osteoporosis. You may have this done starting at age 63. Mammogram. This may be done every 1-2 years. Talk to your health care provider about how often you should have regular mammograms. Talk with your health care provider about your test results, treatment options, and if necessary, the need for more tests. Vaccines  Your health care provider may recommend certain vaccines, such as: Influenza vaccine. This is recommended every year. Tetanus, diphtheria, and acellular pertussis (Tdap, Td) vaccine. You may need a Td booster every 10 years. Zoster vaccine. You may need this after age 51. Pneumococcal 13-valent conjugate (PCV13) vaccine. One dose is recommended after age 20. Pneumococcal polysaccharide (PPSV23) vaccine. One dose is recommended after age 3. Talk to your health  care provider about which  screenings and vaccines you need and how often you need them. This information is not intended to replace advice given to you by your health care provider. Make sure you discuss any questions you have with your health care provider. Document Released: 10/16/2015 Document Revised: 06/08/2016 Document Reviewed: 07/21/2015 Elsevier Interactive Patient Education  2017 [employer] Prevention in the Home Falls can cause injuries. They can happen to people of all ages. There are many things you can do to make your home safe and to help prevent falls. What can I do on the outside of my home? Regularly fix the edges of walkways and driveways and fix any cracks. Remove anything that might make you trip as you walk through a door, such as a raised step or threshold. Trim any bushes or trees on the path to your home. Use bright outdoor lighting. Clear any walking paths of anything that might make someone trip, such as rocks or tools. Regularly check to see if handrails are loose or broken. Make sure that both sides of any steps have handrails. Any raised decks and porches should have guardrails on the edges. Have any leaves, snow, or ice cleared regularly. Use sand or salt on walking paths during winter. Clean up any spills in your garage right away. This includes oil or grease spills. What can I do in the bathroom? Use night lights. Install grab bars by the toilet and in the tub and shower. Do not use towel bars as grab bars. Use non-skid mats or decals in the tub or shower. If you need to sit down in the shower, use a plastic, non-slip stool. Keep the floor dry. Clean up any water that spills on the floor as soon as it happens. Remove soap buildup in the tub or shower regularly. Attach bath mats securely with double-sided non-slip rug tape. Do not have throw rugs and other things on the floor that can make you trip. What can I do in the bedroom? Use night lights. Make sure that you have a  light by your bed that is easy to reach. Do not use any sheets or blankets that are too big for your bed. They should not hang down onto the floor. Have a firm chair that has side arms. You can use this for support while you get dressed. Do not have throw rugs and other things on the floor that can make you trip. What can I do in the kitchen? Clean up any spills right away. Avoid walking on wet floors. Keep items that you use a lot in easy-to-reach places. If you need to reach something above you, use a strong step stool that has a grab bar. Keep electrical cords out of the way. Do not use floor polish or wax that makes floors slippery. If you must use wax, use non-skid floor wax. Do not have throw rugs and other things on the floor that can make you trip. What can I do with my stairs? Do not leave any items on the stairs. Make sure that there are handrails on both sides of the stairs and use them. Fix handrails that are broken or loose. Make sure that handrails are as long as the stairways. Check any carpeting to make sure that it is firmly attached to the stairs. Fix any carpet that is loose or worn. Avoid having throw rugs at the top or bottom of the stairs. If you do have throw rugs, attach them to  the floor with carpet tape. Make sure that you have a light switch at the top of the stairs and the bottom of the stairs. If you do not have them, ask someone to add them for you. What else can I do to help prevent falls? Wear shoes that: Do not have high heels. Have rubber bottoms. Are comfortable and fit you well. Are closed at the toe. Do not wear sandals. If you use a stepladder: Make sure that it is fully opened. Do not climb a closed stepladder. Make sure that both sides of the stepladder are locked into place. Ask someone to hold it for you, if possible. Clearly mark and make sure that you can see: Any grab bars or handrails. First and last steps. Where the edge of each step  is. Use tools that help you move around (mobility aids) if they are needed. These include: Canes. Walkers. Scooters. Crutches. Turn on the lights when you go into a dark area. Replace any light bulbs as soon as they burn out. Set up your furniture so you have a clear path. Avoid moving your furniture around. If any of your floors are uneven, fix them. If there are any pets around you, be aware of where they are. Review your medicines with your doctor. Some medicines can make you feel dizzy. This can increase your chance of falling. Ask your doctor what other things that you can do to help prevent falls. This information is not intended to replace advice given to you by your health care provider. Make sure you discuss any questions you have with your health care provider. Document Released: 07/16/2009 Document Revised: 02/25/2016 Document Reviewed: 10/24/2014 Elsevier Interactive Patient Education  2017 [employer].

## 2022-02-25 ENCOUNTER — Telehealth: Payer: Self-pay | Admitting: *Deleted

## 2022-02-25 NOTE — Telephone Encounter (Signed)
Patient called asking for results of labs she does not understand what she sees. Her next appointment to see Dr Tasia Catchings is August. She is also reporting that her UTI is not any better after 4 days of antibiotics It is getting worse, now has pain back left side. I will instruct her to contact her PCP as noted on telephone note 5/22. Please advise regarding labs  Dian Queen UR Order: [id number] Status: Edited Result - FINAL    Visible to patient: Yes (seen)    Next appt: 03/02/2022 at 02:30 PM in Oncology ([facility])    Dx: Generalized abdominal pain    0 Result Notes     Component Ref Range & Units 9 d ago  Total Protein, Urine Not Estab. mg/dL 5.5   Total Protein, Urine-Ur/day 30 - 150 mg/24 hr 74   Albumin, U % 100.0   ALPHA 1 URINE % 0.0   Alpha 2, Urine % 0.0   % BETA, Urine % 0.0   GAMMA GLOBULIN URINE % 0.0   M-SPIKE %, Urine Not Observed % Not Observed   Immunofixation Result, Urine  Comment   Comment: (NOTE)  The immunofixation pattern appears unremarkable. Evidence of  monoclonal protein is not apparent.   Note:  Comment   Comment: (NOTE)  Protein electrophoresis scan will follow via computer, mail, or  courier delivery.  Performed At: [facility]  [place], [place] [id number]  Rush Farmer MD NB:[id number]   Total Volume  1,350   Comment: Performed at [facility], [address] [place], [place] [address]  Resulting Agency  [facility]         Specimen Collected: 02/16/22 03:30 Last Resulted: 02/21/22 18:36

## 2022-02-25 NOTE — Telephone Encounter (Signed)
I called and left message for patient on voice mail  to contact her PCP regarding her UTI complaints

## 2022-02-25 NOTE — Telephone Encounter (Signed)
I spoke with patient who thanked me for calling and she states that she did receive my earlier message and has made an appointment with her PCP for next week

## 2022-03-01 ENCOUNTER — Inpatient Hospital Stay: Payer: Medicare HMO

## 2022-03-01 ENCOUNTER — Ambulatory Visit: Payer: Medicare HMO | Admitting: Primary Care

## 2022-03-02 ENCOUNTER — Inpatient Hospital Stay: Payer: Medicare HMO

## 2022-03-03 ENCOUNTER — Ambulatory Visit (INDEPENDENT_AMBULATORY_CARE_PROVIDER_SITE_OTHER): Payer: Medicare HMO | Admitting: Primary Care

## 2022-03-03 ENCOUNTER — Encounter: Payer: Self-pay | Admitting: Primary Care

## 2022-03-03 ENCOUNTER — Inpatient Hospital Stay: Payer: Medicare HMO | Attending: Oncology

## 2022-03-03 VITALS — BP 130/72 | HR 81 | Temp 98.6°F | Ht 66.0 in | Wt 255.0 lb

## 2022-03-03 VITALS — BP 142/66 | HR 80 | Temp 97.4°F | Resp 19

## 2022-03-03 DIAGNOSIS — D508 Other iron deficiency anemias: Secondary | ICD-10-CM

## 2022-03-03 DIAGNOSIS — R102 Pelvic and perineal pain unspecified side: Secondary | ICD-10-CM | POA: Insufficient documentation

## 2022-03-03 DIAGNOSIS — E1165 Type 2 diabetes mellitus with hyperglycemia: Secondary | ICD-10-CM | POA: Diagnosis not present

## 2022-03-03 DIAGNOSIS — R300 Dysuria: Secondary | ICD-10-CM | POA: Diagnosis not present

## 2022-03-03 DIAGNOSIS — D509 Iron deficiency anemia, unspecified: Secondary | ICD-10-CM | POA: Diagnosis not present

## 2022-03-03 DIAGNOSIS — Z794 Long term (current) use of insulin: Secondary | ICD-10-CM | POA: Diagnosis not present

## 2022-03-03 DIAGNOSIS — Z79899 Other long term (current) drug therapy: Secondary | ICD-10-CM | POA: Diagnosis not present

## 2022-03-03 LAB — POC URINALSYSI DIPSTICK (AUTOMATED)
Bilirubin, UA: NEGATIVE
Blood, UA: NEGATIVE
Glucose, UA: POSITIVE — AB
Ketones, UA: NEGATIVE
Leukocytes, UA: NEGATIVE
Nitrite, UA: NEGATIVE
Protein, UA: NEGATIVE
Spec Grav, UA: 1.025 (ref 1.010–1.025)
Urobilinogen, UA: 0.2 E.U./dL
pH, UA: 5.5 (ref 5.0–8.0)

## 2022-03-03 LAB — POCT GLYCOSYLATED HEMOGLOBIN (HGB A1C): Hemoglobin A1C: 7.1 % — AB (ref 4.0–5.6)

## 2022-03-03 MED ORDER — IRON SUCROSE 20 MG/ML IV SOLN
200.0000 mg | Freq: Once | INTRAVENOUS | Status: AC
  Administered 2022-03-03: 200 mg via INTRAVENOUS
  Filled 2022-03-03: qty 10

## 2022-03-03 MED ORDER — SODIUM CHLORIDE 0.9 % IV SOLN
Freq: Once | INTRAVENOUS | Status: AC
  Filled 2022-03-03: qty 250

## 2022-03-03 MED ORDER — SODIUM CHLORIDE 0.9 % IV SOLN: 200.0000 mg | Freq: Once | INTRAVENOUS | Status: DC

## 2022-03-03 NOTE — Assessment & Plan Note (Addendum)
Recently recovered from cystitis. Office notes from nephrology and hematology reviewed from [place] and through [facility].  UA today grossly negative. Will obtain urine culture.

## 2022-03-03 NOTE — Progress Notes (Signed)
Subjective:    Patient ID: Danielle Edwards, female    DOB: 01-07-1956, 66 y.o.   MRN: [id number]  HPI  Danielle Edwards is a very pleasant 66 y.o. female with a history of type 2 diabetes, essential hypertension, hyperlipidemia, UTI, anxiety and depression who presents today for follow up of diabetes and recent UTI.  1) Type 2 Diabetes:  Current medications include: Soliqua 25 units BID, metformin XR 1000 mg BID.  She does have diarrhea and some bloating.   He/She is checking his/her blood glucose continuously and is getting readings of low 100's mostly.   Last A1C: 9.4 in February 2023, 7.1 today Last Eye Exam: Due Last Foot Exam: UTD Pneumonia Vaccination: 2016 Urine Microalbumin: None. Managed on ARB Statin: atorvastatin   Dietary changes since last visit: She is watching carbs and sugar intake.    Exercise: None. Active.   2) History of UTI: Recently evaluated by her nephrologist on 01/31/22, during this visit she noticed bilateral flank pain, left worse than right, radiation of pain to her right groin. She couldn't provide a urine specimen at the time but was instructed to return.   She visited her hematologist for iron infusion and mentioned these symptoms. Urine culture was positive for group B strep with S. Agalactiae. She was treated with Cipro 250 mg BID x 5 days.   She completed her course of Cipro, felt no better. Today she mentions residual pelvic pressure. Her flank pain has resolved.   She denies fevers, chills, upper abdominal pain.   Review of Systems  Constitutional:  Negative for chills and fever.  Respiratory:  Negative for shortness of breath.   Cardiovascular:  Negative for chest pain.  Genitourinary:  Positive for pelvic pain. Negative for dysuria, flank pain, frequency and urgency.        Past Medical History:  Diagnosis Date   Allergy    seasonal allergies   Anemia    hx of IDA   Anxiety    on meds   Arthritis    PRN meds-osteoarthritis    Cataract    sx to remove   Chronic kidney disease    CKD-stage 3   Chronic pain    COVID-19 06/02/2020   Depression    on meds   Diabetes type 2, controlled ([place])    on meds   Fibromyalgia    GERD (gastroesophageal reflux disease)    on meds   Headache    History of colonic polyps 1999   Benign   Hx of adenomatous polyp of colon 09/27/2000   2001 - diminutive adenoma [place] 2007 no polyps - [place]   Hyperlipidemia    on meds   Hypertension    on meds   Hypothyroidism    not on meds at this time   IBS (irritable bowel syndrome)    Diarrhea type   IDA (iron deficiency anemia) 02/09/2022   Neuromuscular disorder ([place])    Vitamin D deficiency     Social History   Socioeconomic History   Marital status: Married    Spouse name: Not on file   Number of children: Not on file   Years of education: Not on file   Highest education level: Not on file  Occupational History   Not on file  Tobacco Use   Smoking status: Never   Smokeless tobacco: Never  Vaping Use   Vaping Use: Never used  Substance and Sexual Activity   Alcohol use: Yes    Alcohol/week: 0.0  standard drinks    Comment: rarely   Drug use: No   Sexual activity: Not on file  Other Topics Concern   Not on file  Social History Narrative   Married.   3 children.   Retired, worked at [employer] in [place], [place].   Enjoys being with her grandchildren, being with her friends.   Social Determinants of Health   Financial Resource Strain: Low Risk    Difficulty of Paying Living Expenses: Not hard at all  Food Insecurity: No Food Insecurity   Worried About Charity fundraiser in the Last Year: Never true   [place] in the Last Year: Never true  Transportation Needs: No Transportation Needs   Lack of Transportation (Medical): No   Lack of Transportation (Non-Medical): No  Physical Activity: Insufficiently Active   Days of Exercise per Week: 2 days   Minutes of Exercise per Session: 20 min  Stress:  Stress Concern Present   Feeling of Stress : To some extent  Social Connections: Engineer, building services of Communication with Friends and Family: More than three times a week   Frequency of Social Gatherings with Friends and Family: More than three times a week   Attends Religious Services: More than 4 times per year   Active Member of [employer] or Organizations: Yes   Attends Music therapist: More than 4 times per year   Marital Status: Married  Human resources officer Violence: Not At Risk   Fear of Current or Ex-Partner: No   Emotionally Abused: No   Physically Abused: No   Sexually Abused: No    Past Surgical History:  Procedure Laterality Date   CHOLECYSTECTOMY  1995   COLONOSCOPY  multiple   COLONOSCOPY  2016   TA's   NASAL SINUS SURGERY  2001   POLYPECTOMY     TA's   TONSILLECTOMY AND ADENOIDECTOMY     VAGINAL HYSTERECTOMY     Complete   WISDOM TOOTH EXTRACTION      Family History  Problem Relation Age of Onset   Colon cancer Mother 8   Lung cancer Mother 8   Skin cancer Mother    Colon polyps Mother 71   Heart disease Father    Diabetes Father    Skin cancer Father    Colon polyps Brother 65   Stomach cancer Paternal Grandmother 59   Breast cancer Neg Hx    Esophageal cancer Neg Hx    Rectal cancer Neg Hx     No Known Allergies  Current Outpatient Medications on File Prior to Visit  Medication Sig Dispense Refill   Accu-Chek FastClix Lancets MISC USE AS INSTRUCTED TO CHECK BLOOD SUGAR UP TO 3 TIMES DAILY 306 each 1   amLODipine (NORVASC) 10 MG tablet Take 1 tablet (10 mg total) by mouth daily. for blood pressure 90 tablet 3   atorvastatin (LIPITOR) 40 MG tablet TAKE ONE TABLET BY MOUTH AT BEDTIME FOR CHOLESTEROL 90 tablet 3   Cholecalciferol (VITAMIN D3) 125 MCG (5000 UT) CAPS Take 5,000 Units by mouth daily.     glucose blood (ACCU-CHEK GUIDE) test strip USE UP TO FOUR TIMES DAILY TO CHECK BLOOD SUGAR (E10.9 E11.9) 200 each 5   Insulin  Glargine-Lixisenatide (SOLIQUA) 100-33 UNT-MCG/ML SOPN Inject 25 Units into the skin 2 (two) times daily. For diabetes. 45 mL 1   Insulin Pen Needle 32G X 6 MM MISC Use as instructed to inject insulin twice daily 200 each 3  loperamide (IMODIUM A-D) 2 MG tablet Take 2 mg by mouth 4 (four) times daily as needed for diarrhea or loose stools.     losartan (COZAAR) 100 MG tablet Take 1 tablet (100 mg total) by mouth daily. for blood pressure 90 tablet 3   Magnesium 200 MG TABS Take by mouth daily.     metFORMIN (GLUCOPHAGE-XR) 500 MG 24 hr tablet Take 4 tablets (2,000 mg total) by mouth daily with breakfast. For diabetes. 360 tablet 1   Multiple Vitamins-Minerals (CENTRUM SILVER ULTRA WOMENS PO) Take by mouth daily.     naloxone (NARCAN) nasal spray 4 mg/0.1 mL SMARTSIG:Both Nares     oxyCODONE (OXY IR/ROXICODONE) 5 MG immediate release tablet      pantoprazole (PROTONIX) 40 MG tablet      promethazine (PHENERGAN) 25 MG tablet Take 25 mg by mouth every 6 (six) hours as needed. Reported on 09/21/2015  1   DULoxetine (CYMBALTA) 30 MG capsule Take 1 capsule (30 mg total) by mouth daily. For pain, anxiety, depression. (Patient not taking: Reported on 03/03/2022) 90 capsule 0   No current facility-administered medications on file prior to visit.    BP 130/72   Pulse 81   Temp 98.6 F (37 C) (Oral)   Ht '5\' 6"'$  (1.676 m)   Wt 255 lb (115.7 kg)   SpO2 99%   BMI 41.16 kg/m  Objective:   Physical Exam Cardiovascular:     Rate and Rhythm: Normal rate and regular rhythm.  Pulmonary:     Effort: Pulmonary effort is normal.     Breath sounds: Normal breath sounds.  Musculoskeletal:     Cervical back: Neck supple.  Skin:    General: Skin is warm and dry.          Assessment & Plan:

## 2022-03-03 NOTE — Patient Instructions (Signed)
Separate your metformin. Take 2 tablets in the morning and 2 tablets in the evening for diabetes.   The metformin could be causing your diarrhea and bloating. Please update me in 2 weeks.  Please schedule a follow up visit for 3 months.  It was a pleasure to see you today!

## 2022-03-03 NOTE — Assessment & Plan Note (Signed)
Improved with A1C today of 7.1!!  Commended her on dietary changes, encouraged her to exercise.   Continue Soliqua 25 mg BID, metformin XR 1000 mg BID. Discussed to divide her metformin into two doses as she is currently taking metformin XR 2000 mg in the AM. She will update.  Follow up in 3 months.

## 2022-03-03 NOTE — Patient Instructions (Signed)

## 2022-03-04 LAB — URINE CULTURE
MICRO NUMBER:: 13470583
Result:: NO GROWTH
SPECIMEN QUALITY:: ADEQUATE

## 2022-03-08 ENCOUNTER — Inpatient Hospital Stay: Payer: Medicare HMO

## 2022-03-08 DIAGNOSIS — Z789 Other specified health status: Secondary | ICD-10-CM | POA: Diagnosis not present

## 2022-03-08 DIAGNOSIS — L7211 Pilar cyst: Secondary | ICD-10-CM | POA: Diagnosis not present

## 2022-03-08 DIAGNOSIS — R208 Other disturbances of skin sensation: Secondary | ICD-10-CM | POA: Diagnosis not present

## 2022-03-09 ENCOUNTER — Inpatient Hospital Stay: Payer: Medicare HMO

## 2022-03-09 VITALS — BP 119/60 | HR 77 | Temp 96.6°F | Resp 16

## 2022-03-09 DIAGNOSIS — D509 Iron deficiency anemia, unspecified: Secondary | ICD-10-CM | POA: Diagnosis not present

## 2022-03-09 DIAGNOSIS — D508 Other iron deficiency anemias: Secondary | ICD-10-CM

## 2022-03-09 DIAGNOSIS — Z79899 Other long term (current) drug therapy: Secondary | ICD-10-CM | POA: Diagnosis not present

## 2022-03-09 MED ORDER — SODIUM CHLORIDE 0.9 % IV SOLN: 200.0000 mg | Freq: Once | INTRAVENOUS | Status: DC

## 2022-03-09 MED ORDER — IRON SUCROSE 20 MG/ML IV SOLN
200.0000 mg | Freq: Once | INTRAVENOUS | Status: AC
  Administered 2022-03-09: 200 mg via INTRAVENOUS
  Filled 2022-03-09: qty 10

## 2022-03-09 MED ORDER — SODIUM CHLORIDE 0.9 % IV SOLN
Freq: Once | INTRAVENOUS | Status: AC
  Filled 2022-03-09: qty 250

## 2022-03-15 ENCOUNTER — Telehealth: Payer: Self-pay

## 2022-03-15 NOTE — Chronic Care Management (AMB) (Signed)
Chronic Care Management Pharmacy Assistant   Name: Danielle Edwards  MRN: [id number] DOB: 05/15/1956  Reason for Encounter:Diabetes Disease State    Recent office visits:  03/03/22-Katherine Clark,NP(PCP)-f/u dysuria,labs ordered(A1c 7.1,UA neg.)Separate your metformin. Take 2 tablets in the morning and 2 tablets in the evening(diarrhea) 02/24/22-Lorrie Barnes,LPN(fam med)-telemedicine AWV,discussed screenings, vaccines, no medication changes  Recent consult visits:  03/09/22-[facility]- ongoing infusion 02/09/22-Zhou Yu,MD(oncology)-Anemia Evaluation.labs reviewed,f/u 3 months 02/02/22-Elizabeth Walsh,NP(pulmo)-Sleep study consult f/u 6 weeks 01/31/22-Harmeet Singh,MD(nephrology)-f/u--We will consider SGLT2 inhibitor in future,Refer to hematology for evaluation of IV iron therapy. F/u 4 months 01/28/22-Keith Robinson(Dermatology) no data found  Hospital visits:  None in previous 6 months  Medications: Outpatient Encounter Medications as of 03/15/2022  Medication Sig   Accu-Chek FastClix Lancets MISC USE AS INSTRUCTED TO CHECK BLOOD SUGAR UP TO 3 TIMES DAILY   amLODipine (NORVASC) 10 MG tablet Take 1 tablet (10 mg total) by mouth daily. for blood pressure   atorvastatin (LIPITOR) 40 MG tablet TAKE ONE TABLET BY MOUTH AT BEDTIME FOR CHOLESTEROL   Cholecalciferol (VITAMIN D3) 125 MCG (5000 UT) CAPS Take 5,000 Units by mouth daily.   DULoxetine (CYMBALTA) 30 MG capsule Take 1 capsule (30 mg total) by mouth daily. For pain, anxiety, depression. (Patient not taking: Reported on 03/03/2022)   glucose blood (ACCU-CHEK GUIDE) test strip USE UP TO FOUR TIMES DAILY TO CHECK BLOOD SUGAR (E10.9 E11.9)   Insulin Glargine-Lixisenatide (SOLIQUA) 100-33 UNT-MCG/ML SOPN Inject 25 Units into the skin 2 (two) times daily. For diabetes.   Insulin Pen Needle 32G X 6 MM MISC Use as instructed to inject insulin twice daily   loperamide (IMODIUM A-D) 2 MG tablet Take 2 mg by mouth 4 (four) times daily as needed for  diarrhea or loose stools.   losartan (COZAAR) 100 MG tablet Take 1 tablet (100 mg total) by mouth daily. for blood pressure   Magnesium 200 MG TABS Take by mouth daily.   metFORMIN (GLUCOPHAGE-XR) 500 MG 24 hr tablet Take 4 tablets (2,000 mg total) by mouth daily with breakfast. For diabetes.   Multiple Vitamins-Minerals (CENTRUM SILVER ULTRA WOMENS PO) Take by mouth daily.   naloxone (NARCAN) nasal spray 4 mg/0.1 mL SMARTSIG:Both Nares   oxyCODONE (OXY IR/ROXICODONE) 5 MG immediate release tablet    pantoprazole (PROTONIX) 40 MG tablet    promethazine (PHENERGAN) 25 MG tablet Take 25 mg by mouth every 6 (six) hours as needed. Reported on 09/21/2015   No facility-administered encounter medications on file as of 03/15/2022.     Recent Relevant Labs: Lab Results  Component Value Date/Time   HGBA1C 7.1 (A) 03/03/2022 12:48 PM   HGBA1C 9.4 (H) 11/25/2021 01:22 PM   HGBA1C 10.0 (A) 07/16/2021 11:14 AM   HGBA1C 10.9 (H) 06/11/2020 03:14 PM   MICROALBUR 4.4 (H) 08/05/2016 11:14 AM   MICROALBUR 2.0 (H) 04/07/2015 09:15 AM    Kidney Function Lab Results  Component Value Date/Time   CREATININE 1.19 (H) 02/09/2022 12:03 PM   CREATININE 1.33 (H) 11/25/2021 01:22 PM   GFR 41.66 (L) 11/25/2021 01:22 PM   GFRNONAA 50 (L) 02/09/2022 12:03 PM     Contacted patient on 03/31/22 to discuss diabetes disease state.   Current antihyperglycemic regimen:  Soliqua 25 units twice daily Metformin XR 500 mg- take  4 tablets in am  Freestyle Libre 2   Patient verbally confirms she is taking the above medications as directed. Yes  What diet changes have been made to improve diabetes control?  Patient is aware  of sugar intake and carbohydrate intake and is staying hydrated  What recent interventions/DTPs have been made to improve glycemic control:   Advised to return for repeat K+ (4.4)  - Patient reports normal limits from repeat labs   Have there been any recent hospitalizations or ED visits since  last visit with CPP? No  Patient denies hypoglycemic symptoms, including Pale, Sweaty, Shaky, Hungry, Nervous/irritable, and Vision changes  Patient denies hyperglycemic symptoms, including blurry vision, excessive thirst, fatigue, polyuria, and weakness  How often are you checking your blood sugar?  Several times daily with using the Freestyle Libre 2 - enjoys the system  What are your blood sugars ranging?  Fasting:  she is averaging  90,95,110    During the week, how often does your blood glucose drop below 70? Never  Are you checking your feet daily/regularly? Yes  Adherence Review: Is the patient currently on a STATIN medication? Yes Is the patient currently on ACE/ARB medication? Yes Does the patient have >5 day gap between last estimated fill dates? CPP to review   Care Gaps: Annual wellness visit in last year? Yes Most recent A1C reading:7.1  03/03/22 Most Recent BP reading:  130/72  81-P  03/03/22  Last eye exam / retinopathy screening: Dr.Gross Last diabetic foot exam:UTD  Counseled patient on importance of annual eye and foot exam. UTD   Star Rating Drugs:  Medication:  Last Fill: Day Supply Atorvastatin '40mg'$  11/25/21 90 Soliqua   02/24/22 30 Losartan '1000mg'$  02/24/22 90 Metformin XR '500mg'$  02/24/22 90  Discussed atorvastatin use with the patient. She states she takes about 2-3 days a week, due to reading some side effects and has concerns about taking all the time. Patient has scheduled appointment with CPP.   PCP appointment on 06/03/22 and CCM appointment on 05/23/22  Charlene Brooke, CPP notified  [name], [place]  [phone]

## 2022-03-31 DIAGNOSIS — J019 Acute sinusitis, unspecified: Secondary | ICD-10-CM | POA: Diagnosis not present

## 2022-03-31 DIAGNOSIS — Z20822 Contact with and (suspected) exposure to covid-19: Secondary | ICD-10-CM | POA: Diagnosis not present

## 2022-04-01 NOTE — Telephone Encounter (Signed)
Patient is taking atorvastatin less than prescribed due to reading about side effects and now has concerns. Scheduled sooner CCM appt to discuss with PharmD.

## 2022-04-12 ENCOUNTER — Telehealth: Payer: Self-pay

## 2022-04-12 NOTE — Chronic Care Management (AMB) (Signed)
    Chronic Care Management Pharmacy Assistant   Name: Danielle Edwards  MRN: [id number] DOB: 1956-03-16   Reason for Encounter: Reminder Call   Conditions to be addressed/monitored: HTN, DMII, and CKD Stage 3a  Medications: Outpatient Encounter Medications as of 04/12/2022  Medication Sig   Accu-Chek FastClix Lancets MISC USE AS INSTRUCTED TO CHECK BLOOD SUGAR UP TO 3 TIMES DAILY   amLODipine (NORVASC) 10 MG tablet Take 1 tablet (10 mg total) by mouth daily. for blood pressure   atorvastatin (LIPITOR) 40 MG tablet TAKE ONE TABLET BY MOUTH AT BEDTIME FOR CHOLESTEROL   Cholecalciferol (VITAMIN D3) 125 MCG (5000 UT) CAPS Take 5,000 Units by mouth daily.   DULoxetine (CYMBALTA) 30 MG capsule Take 1 capsule (30 mg total) by mouth daily. For pain, anxiety, depression. (Patient not taking: Reported on 03/03/2022)   glucose blood (ACCU-CHEK GUIDE) test strip USE UP TO FOUR TIMES DAILY TO CHECK BLOOD SUGAR (E10.9 E11.9)   Insulin Glargine-Lixisenatide (SOLIQUA) 100-33 UNT-MCG/ML SOPN Inject 25 Units into the skin 2 (two) times daily. For diabetes.   Insulin Pen Needle 32G X 6 MM MISC Use as instructed to inject insulin twice daily   loperamide (IMODIUM A-D) 2 MG tablet Take 2 mg by mouth 4 (four) times daily as needed for diarrhea or loose stools.   losartan (COZAAR) 100 MG tablet Take 1 tablet (100 mg total) by mouth daily. for blood pressure   Magnesium 200 MG TABS Take by mouth daily.   metFORMIN (GLUCOPHAGE-XR) 500 MG 24 hr tablet Take 4 tablets (2,000 mg total) by mouth daily with breakfast. For diabetes.   Multiple Vitamins-Minerals (CENTRUM SILVER ULTRA WOMENS PO) Take by mouth daily.   naloxone (NARCAN) nasal spray 4 mg/0.1 mL SMARTSIG:Both Nares   oxyCODONE (OXY IR/ROXICODONE) 5 MG immediate release tablet    pantoprazole (PROTONIX) 40 MG tablet    promethazine (PHENERGAN) 25 MG tablet Take 25 mg by mouth every 6 (six) hours as needed. Reported on 09/21/2015   No facility-administered  encounter medications on file as of 04/12/2022.    Cordelia Bessinger was contacted to remind of upcoming telephone visit with Charlene Brooke  on 04/15/22 at 1:00pm. Patient was reminded to have any blood glucose and blood pressure readings available for review at appointment.   Patient confirmed appointment.   Are you having any problems with your medications? No   Do you have any concerns you like to discuss with the pharmacist? No   CCM referral has been placed prior to visit?  No    Star Rating Drugs: Medication:  Last Fill: Day Supply Atorvastatin '40mg'$  11/25/21 90 Soliqua 100-33 03/24/22 30 Losartan '100mg'$  02/24/22 90 Metformin XR500 02/24/22 [place], CPP notified  [name], [place]  [phone]

## 2022-04-15 ENCOUNTER — Ambulatory Visit: Payer: Medicare HMO | Admitting: Pharmacist

## 2022-04-15 DIAGNOSIS — E1169 Type 2 diabetes mellitus with other specified complication: Secondary | ICD-10-CM

## 2022-04-15 DIAGNOSIS — E119 Type 2 diabetes mellitus without complications: Secondary | ICD-10-CM

## 2022-04-15 DIAGNOSIS — I10 Essential (primary) hypertension: Secondary | ICD-10-CM

## 2022-04-15 NOTE — Progress Notes (Signed)
Chronic Care Management Pharmacy Note  04/20/2022 Name:  Danielle Edwards MRN:  [id number] DOB:  Jul 27, 1956  Summary: CCM F/U visit -Pt stopped taking statin recently due to concerns for dementia association; discussed statin benefits/risks at length -Reviewed [place]; she hasn't worn it in a couple weeks but overall DM appears well controlled (average glucose 146, TIR 88%)  Recommendations/Changes made from today's visit: -Restart atorvastatin - pt agreed. Advised pt to always contact PCP/PharmD before stopping medication  Plan: -[place] will call patient 3 months for adherence update -Pharmacist follow up televisit scheduled for 6 months -PCP F/U 06/03/22    Subjective: Danielle Edwards is an 66 y.o. year old female who is a primary patient of Pleas Koch, NP.  The CCM team was consulted for assistance with disease management and care coordination needs.    Engaged with patient by telephone for follow up visit in response to provider referral for pharmacy case management and/or care coordination services.   Consent to Services:  The patient was given information about Chronic Care Management services, agreed to services, and gave verbal consent prior to initiation of services.  Please see initial visit note for detailed documentation.   Patient Care Team: Pleas Koch, NP as PCP - General (Nurse Practitioner) Charlton Haws, [facility] as Pharmacist (Pharmacist)  Recent office visits: 03/03/22 NP Allie Bossier OV: c/o dysuria. A1c 7.1%; advised to separate metformin 2 AM, 2 PM.  11/25/21 NP Allie Bossier OV: Annual exam. Iron is low - f/u with nephrology. Continue same DM regimen given improved fasting BG. Start duloxetine 30 mg daily. Advised DEXA and Mammogram.  Recent consult visits: 11/01/21 Dr Sander Radon (Pain medicine): f/u back/neck pain  Hospital visits: None in previous 6 months   Objective:  Lab Results  Component Value Date   CREATININE  1.19 (H) 02/09/2022   BUN 28 (H) 02/09/2022   GFR 41.66 (L) 11/25/2021   GFRNONAA 50 (L) 02/09/2022   NA 136 02/09/2022   K 4.0 02/09/2022   CALCIUM 9.2 02/09/2022   CO2 24 02/09/2022   GLUCOSE 129 (H) 02/09/2022    Lab Results  Component Value Date/Time   HGBA1C 7.1 (A) 03/03/2022 12:48 PM   HGBA1C 9.4 (H) 11/25/2021 01:22 PM   HGBA1C 10.0 (A) 07/16/2021 11:14 AM   HGBA1C 10.9 (H) 06/11/2020 03:14 PM   GFR 41.66 (L) 11/25/2021 01:22 PM   GFR 44.74 (L) 12/25/2020 11:25 AM   MICROALBUR 4.4 (H) 08/05/2016 11:14 AM   MICROALBUR 2.0 (H) 04/07/2015 09:15 AM    Last diabetic Eye exam:  Lab Results  Component Value Date/Time   HMDIABEYEEXA No Retinopathy 02/26/2021 12:00 AM    Last diabetic Foot exam: No results found for: "HMDIABFOOTEX"   Lab Results  Component Value Date   CHOL 104 11/25/2021   HDL 45.10 11/25/2021   LDLCALC 44 11/25/2021   LDLDIRECT 112.0 02/05/2018   TRIG 76.0 11/25/2021   CHOLHDL 2 11/25/2021       Latest Ref Rng & Units 02/09/2022   12:03 PM 11/25/2021    1:22 PM 06/11/2020    3:14 PM  Hepatic Function  Total Protein 6.5 - 8.1 g/dL 7.9  7.5  7.5   Albumin 3.5 - 5.0 g/dL 3.9  4.2  4.0   AST 15 - 41 U/L 18  14  29    ALT 0 - 44 U/L 19  15  31    Alk Phosphatase 38 - 126 U/L 78  69  77  Total Bilirubin 0.3 - 1.2 mg/dL 0.6  0.3  0.4     Lab Results  Component Value Date/Time   TSH 2.84 11/25/2021 01:22 PM   TSH 2.29 06/11/2020 03:14 PM       Latest Ref Rng & Units 02/09/2022   12:03 PM 11/25/2021    1:22 PM 04/02/2021   11:45 AM  CBC  WBC 4.0 - 10.5 K/uL 9.1  7.4  7.4   Hemoglobin 12.0 - 15.0 g/dL 11.0  11.3  10.8   Hematocrit 36.0 - 46.0 % 34.7  35.2  33.3   Platelets 150 - 400 K/uL 301  294.0  270.0     Lab Results  Component Value Date/Time   VD25OH 62.85 11/25/2021 01:22 PM   VD25OH 54.23 12/25/2020 11:25 AM    Clinical ASCVD: No  The ASCVD Risk score (Arnett DK, et al., 2019) failed to calculate for the following reasons:   The  valid total cholesterol range is 130 to 320 mg/dL       02/24/2022    2:17 PM 02/23/2021    1:32 PM 12/08/2020    1:27 PM  Depression screen PHQ 2/9  Decreased Interest 0 0 1  Down, Depressed, Hopeless 1 0 0  PHQ - 2 Score 1 0 1  Altered sleeping  0   Tired, decreased energy  0   Change in appetite  0   Feeling bad or failure about yourself   0   Trouble concentrating  0   Moving slowly or fidgety/restless  0   Suicidal thoughts  0   PHQ-9 Score  0   Difficult doing work/chores  Not difficult at all       Social History   Tobacco Use  Smoking Status Never  Smokeless Tobacco Never   BP Readings from Last 3 Encounters:  03/09/22 119/60  03/03/22 (!) 142/66  03/03/22 130/72   Pulse Readings from Last 3 Encounters:  03/09/22 77  03/03/22 80  03/03/22 81   Wt Readings from Last 3 Encounters:  03/03/22 255 lb (115.7 kg)  02/24/22 258 lb (117 kg)  02/09/22 258 lb (117 kg)   BMI Readings from Last 3 Encounters:  03/03/22 41.16 kg/m  02/24/22 41.64 kg/m  02/09/22 41.64 kg/m    Assessment/Interventions: Review of patient past medical history, allergies, medications, health status, including review of consultants reports, laboratory and other test data, was performed as part of comprehensive evaluation and provision of chronic care management services.   SDOH:  (Social Determinants of Health) assessments and interventions performed: No - done 01/2022 AWV  SDOH Screenings   Alcohol Screen: Low Risk  (02/24/2022)   Alcohol Screen    Last Alcohol Screening Score (AUDIT): 0  Depression (PHQ2-9): Low Risk  (02/24/2022)   Depression (PHQ2-9)    PHQ-2 Score: 1  Financial Resource Strain: Low Risk  (02/24/2022)   Overall Financial Resource Strain (CARDIA)    Difficulty of Paying Living Expenses: Not hard at all  Food Insecurity: No Food Insecurity (02/24/2022)   Hunger Vital Sign    Worried About Running Out of Food in the Last Year: Never true    Ran Out of Food in the  Last Year: Never true  Housing: Low Risk  (02/24/2022)   Housing    Last Housing Risk Score: 0  Physical Activity: Insufficiently Active (02/24/2022)   Exercise Vital Sign    Days of Exercise per Week: 2 days    Minutes of Exercise per Session: 20 min  Social Connections: Socially Integrated (02/24/2022)   Social Connection and Isolation Panel [NHANES]    Frequency of Communication with Friends and Family: More than three times a week    Frequency of Social Gatherings with Friends and Family: More than three times a week    Attends Religious Services: More than 4 times per year    Active Member of [employer] or Organizations: Yes    Attends Archivist Meetings: More than 4 times per year    Marital Status: Married  Stress: Stress Concern Present (02/24/2022)   [employer] [place]    Feeling of Stress : To some extent  Tobacco Use: Low Risk  (03/03/2022)   Patient History    Smoking Tobacco Use: Never    Smokeless Tobacco Use: Never    Passive Exposure: Not on file  Transportation Needs: No Transportation Needs (02/24/2022)   PRAPARE - Transportation    Lack of Transportation (Medical): No    Lack of Transportation (Non-Medical): No    CCM Care Plan  No Known Allergies  Medications Reviewed Today     Reviewed by Pleas Koch, NP (Nurse Practitioner) on 03/03/22 at 1312  Med List Status: <None>   Medication Order Taking? Sig Documenting Provider Last Dose Status Informant  Accu-Chek FastClix Lancets MISC [id number] Yes USE AS INSTRUCTED TO CHECK BLOOD SUGAR UP TO 3 TIMES DAILY Pleas Koch, NP Taking Active   amLODipine (NORVASC) 10 MG tablet [id number] Yes Take 1 tablet (10 mg total) by mouth daily. for blood pressure Pleas Koch, NP Taking Active   atorvastatin (LIPITOR) 40 MG tablet [id number] Yes TAKE ONE TABLET BY MOUTH AT BEDTIME FOR CHOLESTEROL Pleas Koch, NP Taking Active   Cholecalciferol  (VITAMIN D3) 125 MCG (5000 UT) CAPS [id number] Yes Take 5,000 Units by mouth daily. [provider] Taking Active   DULoxetine (CYMBALTA) 30 MG capsule [id number] No Take 1 capsule (30 mg total) by mouth daily. For pain, anxiety, depression.  Patient not taking: Reported on 03/03/2022   Pleas Koch, NP Not Taking Active   glucose blood (ACCU-CHEK GUIDE) test strip [id number] Yes USE UP TO FOUR TIMES DAILY TO CHECK BLOOD SUGAR (E10.9 E11.9) Pleas Koch, NP Taking Active   Insulin Glargine-Lixisenatide [facility]) 100-33 UNT-MCG/ML SOPN [id number] Yes Inject 25 Units into the skin 2 (two) times daily. For diabetes. Pleas Koch, NP Taking Active   Insulin Pen Needle 32G X 6 MM MISC [id number] Yes Use as instructed to inject insulin twice daily Pleas Koch, NP Taking Active   loperamide (IMODIUM A-D) 2 MG tablet [id number] Yes Take 2 mg by mouth 4 (four) times daily as needed for diarrhea or loose stools. [provider] Taking Active   losartan (COZAAR) 100 MG tablet [id number] Yes Take 1 tablet (100 mg total) by mouth daily. for blood pressure Pleas Koch, NP Taking Active   Magnesium 200 MG TABS [id number] Yes Take by mouth daily. [provider] Taking Active   metFORMIN (GLUCOPHAGE-XR) 500 MG 24 hr tablet [id number] Yes Take 4 tablets (2,000 mg total) by mouth daily with breakfast. For diabetes. Pleas Koch, NP Taking Active   Multiple Vitamins-Minerals (CENTRUM SILVER ULTRA WOMENS PO) [id number] Yes Take by mouth daily. [provider] Taking Active   naloxone [facility]) nasal spray 4 mg/0.1 mL [id number] Yes SMARTSIG:Both Nares [provider] Taking Active   oxyCODONE (OXY IR/ROXICODONE) 5 MG immediate release tablet [id number] Yes  [provider] Taking Active   pantoprazole (PROTONIX) 40 MG tablet [id number] Yes  [provider] Taking Active   promethazine (PHENERGAN) 25 MG tablet [id number] Yes Take 25 mg by  mouth every 6 (six) hours as needed. Reported on 09/21/2015 [provider] Taking Active            Med Note Jacqualin Combes   [facility] Aug 05, 2016 10:19 AM)              Patient Active Problem List   Diagnosis Date Noted   Pelvic pressure in female 03/03/2022   Stage 3a chronic kidney disease ([place]) 02/09/2022   Generalized abdominal pain 02/09/2022   IDA (iron deficiency anemia) 02/09/2022   Loud snoring 02/02/2022   Hypercalcemia 02/20/2020   Localized edema 02/20/2020   History of attention deficit disorder 10/18/2019   Fatigue 09/17/2019   CKD (chronic kidney disease) stage 3, GFR 30-59 ml/min (HCC) 12/11/2018   Anemia 02/06/2018   Vitamin D deficiency 08/05/2016   Recurrent sinusitis 10/30/2015   Acute cough 10/30/2015   Preventative health care 04/13/2015   Hyperlipidemia LDL goal <100 04/13/2015   Esophageal reflux 03/23/2015   Family history of colon cancer in mother 02/23/2015   Family history of malignant neoplasm of digestive organs 02/23/2015   Essential hypertension 02/10/2015   Type 2 diabetes mellitus with hyperglycemia ([place]) 02/10/2015   Hypothyroidism 02/10/2015   Chronic pain 02/10/2015   Anxiety and depression 02/10/2015   Hx of adenomatous polyp of colon 09/27/2000    Immunization History  Administered Date(s) Administered   DTaP 12/01/2013   Fluad Quad(high Dose 65+) 07/21/2020, 07/16/2021   Influenza Inj Mdck Quad Pf 11/03/2017   Influenza,inj,Quad PF,6+ Mos 06/29/2015, 05/16/2018, 09/11/2019   Influenza-Unspecified 09/11/2019   Janssen (J&J) SARS-COV-2 Vaccination 12/11/2019   PFIZER(Purple Top)SARS-COV-2 Vaccination 09/19/2020   PNEUMOCOCCAL CONJUGATE-20 05/26/2021   Pneumococcal Polysaccharide-23 04/13/2015   Zoster Recombinat (Shingrix) 05/21/2018, 09/12/2018    Conditions to be addressed/monitored:  Hypertension, Hyperlipidemia, Diabetes, and Chronic Kidney Disease  Care Plan : [place]  Updates made by  Charlton Haws, [place] since 04/20/2022 12:00 AM     Problem: Hypertension, Hyperlipidemia, Diabetes, and Chronic Kidney Disease   Priority: High     Long-Range Goal: Disease Management   Start Date: 12/08/2020  Expected End Date: 12/25/2022  This Visit's Progress: On track  Recent Progress: On track  Priority: High  Note:   Current Barriers:  Does not contact provider office for questions/concerns  Pharmacist Clinical Goal(s):  Patient will contact provider office for questions/concerns as evidenced notation of same in electronic health record through collaboration with PharmD and provider.   Interventions: 1:1 collaboration with Pleas Koch, NP regarding development and update of comprehensive plan of care as evidenced by provider attestation and co-signature Inter-disciplinary care team collaboration (see longitudinal plan of care) Comprehensive medication review performed; medication list updated in electronic medical record  Hypertension / CKD Stage 3b (BP goal <140/90) -Controlled - controlled per clinic readings, no updates today, stable  -Current treatment: Losartan 100 mg  daily - Appropriate, Effective, Safe, Accessible Amlodipine 40 mg daily - Appropriate, Effective, Safe, Accessible -Medications previously tried: none reported -Current home readings: none reported, not routinely monitoring  -Denies hypotensive/hypertensive symptoms -Recommended to continue current medication  Hyperlipidemia: (LDL goal < 70) -Query Controlled - LDL 44 (11/2021) at goal, peak LDL 130 from 2017; however pt recently stopped taking atorvastatin after hearing about possible relation to dementia -Current treatment: Atorvastatin 40  mg daily - Appropriate, Effective, Safe, Accessible -Medications previously tried: none reported -Reviewed statin risks/benefits at length - statins have not been satisfactorily linked to dementia but can cause some short term (typically transient) changes  in memory; ultimiately benefits outweigh risk for her, she agreed to restart statin -Recommended to continue current medication  Diabetes (A1c goal <7%) -Not ideally controlled - A1c 7.1% (03/2022), improved from 9.4% (11/2021); pt reports overall improvement since starting [facility] -Current home glucose readings: Freestyle Libre using app Reviewed AGP report: 03/17/22 to 03/30/22. Sensor active: 57%  Time in range (70-180): 88% (goal > 70%)  High (>180): 12%  Low (< 70): 0% (goal < 4%)  GMI: n/a; Average glucose: 146  -Current medications: Soliqua 25 units BID - Appropriate, Effective, Safe, Accessible Metformin XR 500 mg - 4 tab daily - Appropriate, Effective, Safe, Accessible Freestyle Libre 2 -Medications previously tried: glimepiride, Jardiance (vaginal discomfort/yeast infections), Januvia (cost), Trulicity (cost) -Educated on A1c and blood sugar goals; Prevention and management of hypoglycemic episodes; Continuous glucose monitoring; -Recommended to continue current medication; advised to start using Libre again  Patient Goals/Self-Care Activities Patient will:  - take medications as prescribed as evidenced by patient report and record review focus on medication adherence by routine check glucose using Libre, document, and provide at future appointments engage in dietary modifications by limiting carb/sugar        Medication Assistance: None required.  Patient affirms current coverage meets needs.  Compliance/Adherence/Medication fill history: Care Gaps: Mammogram - scheduled for 02/02/22  Star-Rating Drugs: Atorvastatin - PDC 95% Losartan - PDC 100% Metformin - PDC 100%  Medication Access: Within the past 30 days, how often has patient missed a dose of medication? Stopped atorvastatin on her own Is a pillbox or other method used to improve adherence? Yes  Factors that may affect medication adherence? lack of understanding of disease management Are meds synced by  current pharmacy? No  Are meds delivered by current pharmacy? No  Does patient experience delays in picking up medications due to transportation concerns? No   Upstream Services Reviewed: Is patient disadvantaged to use [facility]?: No  Current Rx insurance plan: [employer] [place] Name and location of Current pharmacy:  [place], [place] [address] [place] [address] Phone: [phone] Fax: [phone]  [facility] services reviewed with patient today?: No  Patient requests to transfer care to [facility]?: No  Reason patient declined to change pharmacies: Loyalty to other pharmacy/Patient preference  Care Plan and Follow Up Patient Decision:  Patient agrees to Care Plan and Follow-up.  Plan: Telephone follow up appointment with care management team member scheduled for:  6 months  Charlene Brooke, PharmD, BCACP Clinical Pharmacist [facility] at [facility] [phone]

## 2022-04-20 NOTE — Patient Instructions (Signed)
Visit Information  Phone number for Pharmacist: [phone]   Goals Addressed   None     Care Plan : [place]  Updates made by Charlton Haws, RPH since 04/20/2022 12:00 AM     Problem: Hypertension, Hyperlipidemia, Diabetes, and Chronic Kidney Disease   Priority: High     Long-Range Goal: Disease Management   Start Date: 12/08/2020  Expected End Date: 12/25/2022  This Visit's Progress: On track  Recent Progress: On track  Priority: High  Note:   Current Barriers:  Does not contact provider office for questions/concerns  Pharmacist Clinical Goal(s):  Patient will contact provider office for questions/concerns as evidenced notation of same in electronic health record through collaboration with PharmD and provider.   Interventions: 1:1 collaboration with Pleas Koch, NP regarding development and update of comprehensive plan of care as evidenced by provider attestation and co-signature Inter-disciplinary care team collaboration (see longitudinal plan of care) Comprehensive medication review performed; medication list updated in electronic medical record  Hypertension / CKD Stage 3b (BP goal <140/90) -Controlled - controlled per clinic readings, no updates today, stable  -Current treatment: Losartan 100 mg  daily - Appropriate, Effective, Safe, Accessible Amlodipine 40 mg daily - Appropriate, Effective, Safe, Accessible -Medications previously tried: none reported -Current home readings: none reported, not routinely monitoring  -Denies hypotensive/hypertensive symptoms -Recommended to continue current medication  Hyperlipidemia: (LDL goal < 70) -Query Controlled - LDL 44 (11/2021) at goal, peak LDL 130 from 2017; however pt recently stopped taking atorvastatin after hearing about possible relation to dementia -Current treatment: Atorvastatin 40 mg daily - Appropriate, Effective, Safe, Accessible -Medications previously tried: none  reported -Reviewed statin risks/benefits at length - statins have not been satisfactorily linked to dementia but can cause some short term (typically transient) changes in memory; ultimiately benefits outweigh risk for her, she agreed to restart statin -Recommended to continue current medication  Diabetes (A1c goal <7%) -Not ideally controlled - A1c 7.1% (03/2022), improved from 9.4% (11/2021); pt reports overall improvement since starting [facility] -Current home glucose readings: Freestyle Libre using app Reviewed AGP report: 03/17/22 to 03/30/22. Sensor active: 57%  Time in range (70-180): 88% (goal > 70%)  High (>180): 12%  Low (< 70): 0% (goal < 4%)  GMI: n/a; Average glucose: 146  -Current medications: Soliqua 25 units BID - Appropriate, Effective, Safe, Accessible Metformin XR 500 mg - 4 tab daily - Appropriate, Effective, Safe, Accessible Freestyle Libre 2 -Medications previously tried: glimepiride, Jardiance (vaginal discomfort/yeast infections), Januvia (cost), Trulicity (cost) -Educated on A1c and blood sugar goals; Prevention and management of hypoglycemic episodes; Continuous glucose monitoring; -Recommended to continue current medication; advised to start using Elenor Legato again  Patient Goals/Self-Care Activities Patient will:  - take medications as prescribed as evidenced by patient report and record review focus on medication adherence by routine check glucose using Libre, document, and provide at future appointments engage in dietary modifications by limiting carb/sugar       Patient verbalizes understanding of instructions and care plan provided today and agrees to view in [place]. Active MyChart status and patient understanding of how to access instructions and care plan via MyChart confirmed with patient.    Telephone follow up appointment with pharmacy team member scheduled for: 6 months  Charlene Brooke, PharmD, [facility] Clinical Pharmacist [facility] [facility] at  [facility] [phone]

## 2022-04-29 DIAGNOSIS — Z6841 Body Mass Index (BMI) 40.0 and over, adult: Secondary | ICD-10-CM | POA: Diagnosis not present

## 2022-04-29 DIAGNOSIS — G8929 Other chronic pain: Secondary | ICD-10-CM | POA: Diagnosis not present

## 2022-04-29 DIAGNOSIS — M25561 Pain in right knee: Secondary | ICD-10-CM | POA: Diagnosis not present

## 2022-05-10 ENCOUNTER — Other Ambulatory Visit: Payer: Medicare HMO

## 2022-05-10 DIAGNOSIS — M545 Low back pain, unspecified: Secondary | ICD-10-CM | POA: Diagnosis not present

## 2022-05-10 DIAGNOSIS — M5416 Radiculopathy, lumbar region: Secondary | ICD-10-CM | POA: Diagnosis not present

## 2022-05-11 ENCOUNTER — Inpatient Hospital Stay: Payer: Medicare HMO | Attending: Oncology

## 2022-05-11 DIAGNOSIS — K219 Gastro-esophageal reflux disease without esophagitis: Secondary | ICD-10-CM | POA: Insufficient documentation

## 2022-05-11 DIAGNOSIS — E785 Hyperlipidemia, unspecified: Secondary | ICD-10-CM | POA: Insufficient documentation

## 2022-05-11 DIAGNOSIS — E039 Hypothyroidism, unspecified: Secondary | ICD-10-CM | POA: Insufficient documentation

## 2022-05-11 DIAGNOSIS — Z7984 Long term (current) use of oral hypoglycemic drugs: Secondary | ICD-10-CM | POA: Insufficient documentation

## 2022-05-11 DIAGNOSIS — Z86010 Personal history of colonic polyps: Secondary | ICD-10-CM | POA: Diagnosis not present

## 2022-05-11 DIAGNOSIS — R5383 Other fatigue: Secondary | ICD-10-CM | POA: Diagnosis not present

## 2022-05-11 DIAGNOSIS — N1831 Chronic kidney disease, stage 3a: Secondary | ICD-10-CM | POA: Diagnosis not present

## 2022-05-11 DIAGNOSIS — I129 Hypertensive chronic kidney disease with stage 1 through stage 4 chronic kidney disease, or unspecified chronic kidney disease: Secondary | ICD-10-CM | POA: Insufficient documentation

## 2022-05-11 DIAGNOSIS — M797 Fibromyalgia: Secondary | ICD-10-CM | POA: Diagnosis not present

## 2022-05-11 DIAGNOSIS — K589 Irritable bowel syndrome without diarrhea: Secondary | ICD-10-CM | POA: Insufficient documentation

## 2022-05-11 DIAGNOSIS — D508 Other iron deficiency anemias: Secondary | ICD-10-CM

## 2022-05-11 DIAGNOSIS — Z79899 Other long term (current) drug therapy: Secondary | ICD-10-CM | POA: Insufficient documentation

## 2022-05-11 DIAGNOSIS — E119 Type 2 diabetes mellitus without complications: Secondary | ICD-10-CM | POA: Insufficient documentation

## 2022-05-11 DIAGNOSIS — D509 Iron deficiency anemia, unspecified: Secondary | ICD-10-CM | POA: Insufficient documentation

## 2022-05-11 DIAGNOSIS — Z809 Family history of malignant neoplasm, unspecified: Secondary | ICD-10-CM

## 2022-05-11 DIAGNOSIS — R1084 Generalized abdominal pain: Secondary | ICD-10-CM

## 2022-05-11 LAB — CBC WITH DIFFERENTIAL/PLATELET
Abs Immature Granulocytes: 0.02 10*3/uL (ref 0.00–0.07)
Basophils Absolute: 0.1 10*3/uL (ref 0.0–0.1)
Basophils Relative: 1 %
Eosinophils Absolute: 0.3 10*3/uL (ref 0.0–0.5)
Eosinophils Relative: 3 %
HCT: 36.3 % (ref 36.0–46.0)
Hemoglobin: 11.7 g/dL — ABNORMAL LOW (ref 12.0–15.0)
Immature Granulocytes: 0 %
Lymphocytes Relative: 33 %
Lymphs Abs: 2.9 10*3/uL (ref 0.7–4.0)
MCH: 29.8 pg (ref 26.0–34.0)
MCHC: 32.2 g/dL (ref 30.0–36.0)
MCV: 92.6 fL (ref 80.0–100.0)
Monocytes Absolute: 0.4 10*3/uL (ref 0.1–1.0)
Monocytes Relative: 5 %
Neutro Abs: 5 10*3/uL (ref 1.7–7.7)
Neutrophils Relative %: 58 %
Platelets: 241 10*3/uL (ref 150–400)
RBC: 3.92 MIL/uL (ref 3.87–5.11)
RDW: 13.7 % (ref 11.5–15.5)
WBC: 8.7 10*3/uL (ref 4.0–10.5)
nRBC: 0 % (ref 0.0–0.2)

## 2022-05-11 LAB — IRON AND TIBC
Iron: 57 ug/dL (ref 28–170)
Saturation Ratios: 17 % (ref 10.4–31.8)
TIBC: 335 ug/dL (ref 250–450)
UIBC: 278 ug/dL

## 2022-05-11 LAB — FERRITIN: Ferritin: 73 ng/mL (ref 11–307)

## 2022-05-12 ENCOUNTER — Encounter: Payer: Self-pay | Admitting: Oncology

## 2022-05-12 ENCOUNTER — Other Ambulatory Visit: Payer: Medicare HMO

## 2022-05-12 ENCOUNTER — Inpatient Hospital Stay: Payer: Medicare HMO

## 2022-05-12 ENCOUNTER — Inpatient Hospital Stay: Payer: Medicare HMO | Admitting: Oncology

## 2022-05-12 VITALS — BP 155/79 | HR 75 | Temp 96.6°F | Ht 66.0 in | Wt 250.0 lb

## 2022-05-12 VITALS — BP 120/62 | HR 72 | Resp 16

## 2022-05-12 DIAGNOSIS — D509 Iron deficiency anemia, unspecified: Secondary | ICD-10-CM | POA: Diagnosis not present

## 2022-05-12 DIAGNOSIS — D508 Other iron deficiency anemias: Secondary | ICD-10-CM

## 2022-05-12 DIAGNOSIS — E039 Hypothyroidism, unspecified: Secondary | ICD-10-CM | POA: Diagnosis not present

## 2022-05-12 DIAGNOSIS — K219 Gastro-esophageal reflux disease without esophagitis: Secondary | ICD-10-CM | POA: Diagnosis not present

## 2022-05-12 DIAGNOSIS — E785 Hyperlipidemia, unspecified: Secondary | ICD-10-CM | POA: Diagnosis not present

## 2022-05-12 DIAGNOSIS — N1831 Chronic kidney disease, stage 3a: Secondary | ICD-10-CM | POA: Diagnosis not present

## 2022-05-12 DIAGNOSIS — R5383 Other fatigue: Secondary | ICD-10-CM | POA: Diagnosis not present

## 2022-05-12 DIAGNOSIS — I129 Hypertensive chronic kidney disease with stage 1 through stage 4 chronic kidney disease, or unspecified chronic kidney disease: Secondary | ICD-10-CM | POA: Diagnosis not present

## 2022-05-12 DIAGNOSIS — M797 Fibromyalgia: Secondary | ICD-10-CM | POA: Diagnosis not present

## 2022-05-12 DIAGNOSIS — E119 Type 2 diabetes mellitus without complications: Secondary | ICD-10-CM | POA: Diagnosis not present

## 2022-05-12 MED ORDER — SODIUM CHLORIDE 0.9 % IV SOLN
200.0000 mg | Freq: Once | INTRAVENOUS | Status: AC
  Administered 2022-05-12: 200 mg via INTRAVENOUS
  Filled 2022-05-12: qty 200

## 2022-05-12 MED ORDER — SODIUM CHLORIDE 0.9 % IV SOLN
Freq: Once | INTRAVENOUS | Status: AC
  Filled 2022-05-12: qty 250

## 2022-05-14 ENCOUNTER — Encounter: Payer: Self-pay | Admitting: Oncology

## 2022-05-14 NOTE — Progress Notes (Signed)
Hematology/Oncology Progress note [phone] [phone]            Patient Care Team: Pleas Koch, NP as PCP - General (Nurse Practitioner) Charlton Haws, [facility] as Pharmacist (Pharmacist)  REFERRING PROVIDER: Pleas Koch, NP  CHIEF COMPLAINTS/REASON FOR VISIT:  Follow-up for iron deficiency anemia  HISTORY OF PRESENTING ILLNESS:   Danielle Edwards is a  66 y.o.  female presents for iron deficiency anemia management. Patient has previously tolerated IV Venofer treatments well. She feels the fatigue has significantly improved. She has no new complaints today. She follows up with gastroenterology, colonoscopy every 3 years due to family history.   Review of Systems  Constitutional:  Negative for appetite change, chills, fatigue and fever.  HENT:   Negative for hearing loss and voice change.   Eyes:  Negative for eye problems.  Respiratory:  Negative for chest tightness and cough.   Cardiovascular:  Negative for chest pain.  Gastrointestinal:  Negative for abdominal distention, abdominal pain, blood in stool, nausea and vomiting.  Endocrine: Negative for hot flashes.  Genitourinary:  Negative for difficulty urinating and frequency.   Musculoskeletal:  Negative for arthralgias.  Skin:  Negative for itching and rash.  Neurological:  Negative for extremity weakness.  Hematological:  Negative for adenopathy.  Psychiatric/Behavioral:  Negative for confusion.     MEDICAL HISTORY:  Past Medical History:  Diagnosis Date   Allergy    seasonal allergies   Anemia    hx of IDA   Anxiety    on meds   Arthritis    PRN meds-osteoarthritis   Cataract    sx to remove   Chronic kidney disease    CKD-stage 3   Chronic pain    COVID-19 06/02/2020   Depression    on meds   Diabetes type 2, controlled ([place])    on meds   Fibromyalgia    GERD (gastroesophageal reflux disease)    on meds   Headache    History of colonic polyps 1999    Benign   Hx of adenomatous polyp of colon 09/27/2000   2001 - diminutive adenoma [place] 2007 no polyps - [place]   Hyperlipidemia    on meds   Hypertension    on meds   Hypothyroidism    not on meds at this time   IBS (irritable bowel syndrome)    Diarrhea type   IDA (iron deficiency anemia) 02/09/2022   Neuromuscular disorder ([place])    Vitamin D deficiency     SURGICAL HISTORY: Past Surgical History:  Procedure Laterality Date   CHOLECYSTECTOMY  1995   COLONOSCOPY  multiple   COLONOSCOPY  2016   TA's   NASAL SINUS SURGERY  2001   POLYPECTOMY     TA's   TONSILLECTOMY AND ADENOIDECTOMY     VAGINAL HYSTERECTOMY     Complete   WISDOM TOOTH EXTRACTION      SOCIAL HISTORY: Social History   Socioeconomic History   Marital status: Married    Spouse name: Not on file   Number of children: Not on file   Years of education: Not on file   Highest education level: Not on file  Occupational History   Not on file  Tobacco Use   Smoking status: Never   Smokeless tobacco: Never  Vaping Use   Vaping Use: Never used  Substance and Sexual Activity   Alcohol use: Yes    Alcohol/week: 0.0 standard drinks of alcohol    Comment:  rarely   Drug use: No   Sexual activity: Not on file  Other Topics Concern   Not on file  Social History Narrative   Married.   3 children.   Retired, worked at [employer] in [place], [place].   Enjoys being with her grandchildren, being with her friends.   Social Determinants of Health   Financial Resource Strain: Low Risk  (02/24/2022)   Overall Financial Resource Strain (CARDIA)    Difficulty of Paying Living Expenses: Not hard at all  Food Insecurity: No Food Insecurity (02/24/2022)   Hunger Vital Sign    Worried About Running Out of Food in the Last Year: Never true    Ran Out of Food in the Last Year: Never true  Transportation Needs: No Transportation Needs (02/24/2022)   PRAPARE - Hydrologist (Medical): No     Lack of Transportation (Non-Medical): No  Physical Activity: Insufficiently Active (02/24/2022)   Exercise Vital Sign    Days of Exercise per Week: 2 days    Minutes of Exercise per Session: 20 min  Stress: Stress Concern Present (02/24/2022)   [place]    Feeling of Stress : To some extent  Social Connections: Socially Integrated (02/24/2022)   Social Connection and Isolation Panel [NHANES]    Frequency of Communication with Friends and Family: More than three times a week    Frequency of Social Gatherings with Friends and Family: More than three times a week    Attends Religious Services: More than 4 times per year    Active Member of [employer] or Organizations: Yes    Attends Music therapist: More than 4 times per year    Marital Status: Married  Human resources officer Violence: Not At Risk (02/24/2022)   Humiliation, Afraid, Rape, and Kick questionnaire    Fear of Current or Ex-Partner: No    Emotionally Abused: No    Physically Abused: No    Sexually Abused: No    FAMILY HISTORY: Family History  Problem Relation Age of Onset   Colon cancer Mother 52   Lung cancer Mother 22   Skin cancer Mother    Colon polyps Mother 28   Heart disease Father    Diabetes Father    Skin cancer Father    Colon polyps Brother 76   Stomach cancer Paternal Grandmother 79   Breast cancer Neg Hx    Esophageal cancer Neg Hx    Rectal cancer Neg Hx     ALLERGIES:  has No Known Allergies.  MEDICATIONS:  Current Outpatient Medications  Medication Sig Dispense Refill   Accu-Chek FastClix Lancets MISC USE AS INSTRUCTED TO CHECK BLOOD SUGAR UP TO 3 TIMES DAILY 306 each 1   amLODipine (NORVASC) 10 MG tablet Take 1 tablet (10 mg total) by mouth daily. for blood pressure 90 tablet 3   atorvastatin (LIPITOR) 40 MG tablet TAKE ONE TABLET BY MOUTH AT BEDTIME FOR CHOLESTEROL 90 tablet 3   Cholecalciferol (VITAMIN D3) 125 MCG (5000 UT)  CAPS Take 5,000 Units by mouth daily.     DULoxetine (CYMBALTA) 30 MG capsule Take 1 capsule (30 mg total) by mouth daily. For pain, anxiety, depression. 90 capsule 0   glucose blood (ACCU-CHEK GUIDE) test strip USE UP TO FOUR TIMES DAILY TO CHECK BLOOD SUGAR (E10.9 E11.9) 200 each 5   Insulin Glargine-Lixisenatide (SOLIQUA) 100-33 UNT-MCG/ML SOPN Inject 25 Units into the skin 2 (two) times daily. For  diabetes. 45 mL 1   Insulin Pen Needle 32G X 6 MM MISC Use as instructed to inject insulin twice daily 200 each 3   loperamide (IMODIUM A-D) 2 MG tablet Take 2 mg by mouth 4 (four) times daily as needed for diarrhea or loose stools.     losartan (COZAAR) 100 MG tablet Take 1 tablet (100 mg total) by mouth daily. for blood pressure 90 tablet 3   Magnesium 200 MG TABS Take by mouth daily.     metFORMIN (GLUCOPHAGE-XR) 500 MG 24 hr tablet Take 4 tablets (2,000 mg total) by mouth daily with breakfast. For diabetes. 360 tablet 1   Multiple Vitamins-Minerals (CENTRUM SILVER ULTRA WOMENS PO) Take by mouth daily.     naloxone (NARCAN) nasal spray 4 mg/0.1 mL SMARTSIG:Both Nares     oxyCODONE (OXY IR/ROXICODONE) 5 MG immediate release tablet      pantoprazole (PROTONIX) 40 MG tablet      promethazine (PHENERGAN) 25 MG tablet Take 25 mg by mouth every 6 (six) hours as needed. Reported on 09/21/2015  1   No current facility-administered medications for this visit.     PHYSICAL EXAMINATION: ECOG PERFORMANCE STATUS: 1 - Symptomatic but completely ambulatory Vitals:   05/12/22 1324  BP: (!) 155/79  Pulse: 75  Temp: (!) 96.6 F (35.9 C)   Filed Weights   05/12/22 1324  Weight: 250 lb (113.4 kg)    Physical Exam Constitutional:      General: She is not in acute distress.    Appearance: She is obese.  HENT:     Head: Normocephalic and atraumatic.  Eyes:     General: No scleral icterus. Cardiovascular:     Rate and Rhythm: Normal rate and regular rhythm.     Heart sounds: Normal heart sounds.   Pulmonary:     Effort: Pulmonary effort is normal. No respiratory distress.     Breath sounds: No wheezing.  Abdominal:     General: Bowel sounds are normal. There is no distension.     Palpations: Abdomen is soft.  Musculoskeletal:        General: No deformity. Normal range of motion.     Cervical back: Normal range of motion and neck supple.  Skin:    General: Skin is warm and dry.     Findings: No erythema or rash.  Neurological:     Mental Status: She is alert and oriented to person, place, and time. Mental status is at baseline.     Cranial Nerves: No cranial nerve deficit.     Coordination: Coordination normal.  Psychiatric:        Mood and Affect: Mood normal.     LABORATORY DATA:  I have reviewed the data as listed     Latest Ref Rng & Units 05/11/2022   11:21 AM 02/09/2022   12:03 PM 11/25/2021    1:22 PM  CBC  WBC 4.0 - 10.5 K/uL 8.7  9.1  7.4   Hemoglobin 12.0 - 15.0 g/dL 11.7  11.0  11.3   Hematocrit 36.0 - 46.0 % 36.3  34.7  35.2   Platelets 150 - 400 K/uL 241  301  294.0       Latest Ref Rng & Units 02/09/2022   12:03 PM 01/03/2022    1:09 PM 11/25/2021    1:22 PM  CMP  Glucose 70 - 99 mg/dL 129   136   BUN 8 - 23 mg/dL 28   35   Creatinine 0.44 -  1.00 mg/dL 1.19   1.33   Sodium 135 - 145 mmol/L 136   141   Potassium 3.5 - 5.1 mmol/L 4.0  4.4  5.2 No hemolysis seen   Chloride 98 - 111 mmol/L 105   107   CO2 22 - 32 mmol/L 24   28   Calcium 8.9 - 10.3 mg/dL 9.2   10.0   Total Protein 6.5 - 8.1 g/dL 7.9   7.5   Total Bilirubin 0.3 - 1.2 mg/dL 0.6   0.3   Alkaline Phos 38 - 126 U/L 78   69   AST 15 - 41 U/L 18   14   ALT 0 - 44 U/L 19   15     Iron/TIBC/Ferritin/ %Sat    Component Value Date/Time   IRON 57 05/11/2022 1121   TIBC 335 05/11/2022 1121   FERRITIN 73 05/11/2022 1121   IRONPCTSAT 17 05/11/2022 1121      RADIOGRAPHIC STUDIES: I have personally reviewed the radiological images as listed and agreed with the findings in the report. No  results found.    ASSESSMENT & PLAN:  1. Other iron deficiency anemia   2. Stage 3a chronic kidney disease (HCC)    #Iron deficiency anemia, Labs are reviewed and discussed with patient.  Hemoglobin improved to 11.7 Patient iron panel also improved. Given that she may also have anemia secondary to chronic kidney disease, I recommend IV Venofer 200 mg x 1 to further improve her iron stores.  #Chronic kidney disease, stage III, check multiple myeloma panel, light chain ratio.  Avoid nephrotoxins.  Encourage oral hydration.  Avoid nephrotoxins.  Follow up 6 months.  Orders Placed This Encounter  Procedures   CBC with Differential/Platelet    Standing Status:   Future    Standing Expiration Date:   05/13/2023   Ferritin    Standing Status:   Future    Standing Expiration Date:   05/13/2023   Iron and TIBC    Standing Status:   Future    Standing Expiration Date:   05/13/2023    All questions were answered. The patient knows to call the clinic with any problems questions or concerns.  cc Pleas Koch, NP    Return of visit: 3 months Thank you for this kind referral and the opportunity to participate in the care of this patient. A copy of today's note is routed to referring provider   Earlie Server, MD, PhD [facility] Hematology Oncology 05/14/2022

## 2022-05-23 ENCOUNTER — Telehealth: Payer: Medicare HMO

## 2022-05-24 ENCOUNTER — Other Ambulatory Visit: Payer: Self-pay | Admitting: Primary Care

## 2022-05-24 DIAGNOSIS — Z794 Long term (current) use of insulin: Secondary | ICD-10-CM

## 2022-05-27 ENCOUNTER — Other Ambulatory Visit: Payer: Self-pay | Admitting: Primary Care

## 2022-05-27 DIAGNOSIS — E119 Type 2 diabetes mellitus without complications: Secondary | ICD-10-CM

## 2022-05-27 NOTE — Telephone Encounter (Signed)
Patient was scheduled on 06/03/2022 but I will be out of the office.  It looks like we have tried to reach her to reschedule, can we try again?

## 2022-05-30 NOTE — Telephone Encounter (Signed)
Patient rescheduled

## 2022-05-30 NOTE — Telephone Encounter (Signed)
Noted  

## 2022-06-03 ENCOUNTER — Ambulatory Visit: Payer: Medicare HMO | Admitting: Primary Care

## 2022-06-09 DIAGNOSIS — D508 Other iron deficiency anemias: Secondary | ICD-10-CM | POA: Diagnosis not present

## 2022-06-09 DIAGNOSIS — E1122 Type 2 diabetes mellitus with diabetic chronic kidney disease: Secondary | ICD-10-CM | POA: Diagnosis not present

## 2022-06-09 DIAGNOSIS — I10 Essential (primary) hypertension: Secondary | ICD-10-CM | POA: Diagnosis not present

## 2022-06-09 DIAGNOSIS — N1831 Chronic kidney disease, stage 3a: Secondary | ICD-10-CM | POA: Diagnosis not present

## 2022-06-10 ENCOUNTER — Other Ambulatory Visit: Payer: Self-pay | Admitting: Nephrology

## 2022-06-10 DIAGNOSIS — D509 Iron deficiency anemia, unspecified: Secondary | ICD-10-CM

## 2022-06-10 DIAGNOSIS — N1831 Chronic kidney disease, stage 3a: Secondary | ICD-10-CM

## 2022-06-21 ENCOUNTER — Ambulatory Visit
Admission: RE | Admit: 2022-06-21 | Discharge: 2022-06-21 | Disposition: A | Discharge status: Home / Self Care | Payer: Medicare HMO | Source: Ambulatory Visit | Attending: Nephrology | Admitting: Nephrology

## 2022-06-21 DIAGNOSIS — E1122 Type 2 diabetes mellitus with diabetic chronic kidney disease: Secondary | ICD-10-CM | POA: Insufficient documentation

## 2022-06-21 DIAGNOSIS — N1831 Chronic kidney disease, stage 3a: Secondary | ICD-10-CM | POA: Insufficient documentation

## 2022-06-21 DIAGNOSIS — D509 Iron deficiency anemia, unspecified: Secondary | ICD-10-CM | POA: Insufficient documentation

## 2022-06-21 DIAGNOSIS — N189 Chronic kidney disease, unspecified: Secondary | ICD-10-CM | POA: Diagnosis not present

## 2022-06-21 DIAGNOSIS — N281 Cyst of kidney, acquired: Secondary | ICD-10-CM | POA: Diagnosis not present

## 2022-06-28 ENCOUNTER — Encounter: Payer: Self-pay | Admitting: Primary Care

## 2022-06-28 ENCOUNTER — Ambulatory Visit (INDEPENDENT_AMBULATORY_CARE_PROVIDER_SITE_OTHER): Payer: Medicare HMO | Admitting: Primary Care

## 2022-06-28 VITALS — BP 128/64 | HR 74 | Temp 97.2°F | Ht 66.0 in | Wt 256.0 lb

## 2022-06-28 DIAGNOSIS — R519 Headache, unspecified: Secondary | ICD-10-CM

## 2022-06-28 DIAGNOSIS — E1165 Type 2 diabetes mellitus with hyperglycemia: Secondary | ICD-10-CM | POA: Diagnosis not present

## 2022-06-28 DIAGNOSIS — Z794 Long term (current) use of insulin: Secondary | ICD-10-CM

## 2022-06-28 DIAGNOSIS — Z23 Encounter for immunization: Secondary | ICD-10-CM | POA: Diagnosis not present

## 2022-06-28 LAB — POCT GLYCOSYLATED HEMOGLOBIN (HGB A1C): Hemoglobin A1C: 7.3 % — AB (ref 4.0–5.6)

## 2022-06-28 MED ORDER — PROPRANOLOL HCL ER 80 MG PO CP24: 80.0000 mg | ORAL_CAPSULE | Freq: Every day | ORAL | 0 refills | Status: DC

## 2022-06-28 NOTE — Assessment & Plan Note (Signed)
No prior diagnosis of migraines, but it seems that she has chronic tension headaches.  Checking MRI brain without contrast.  Start propranolol ER 80 mg HS for headache prevention.  She will update in a few weeks.

## 2022-06-28 NOTE — Assessment & Plan Note (Signed)
Overall stable, would like to see her below 7.  Discussed to never adjust her medications without consulting with me first. Resume Soliqua 25 units BID. Continue metformin XR 2000 mg in AM.  Work on diet. Discussed the absolute need to check glucose levels, especially while on insulin.  Follow up in 4 months.

## 2022-06-28 NOTE — Progress Notes (Signed)
Subjective:    Patient ID: Danielle Edwards, female    DOB: Mar 26, 1956, 65 y.o.   MRN: [id number]  HPI  Danielle Edwards is a very pleasant 66 y.o. female with a history of type 2 diabetes, hypothyroidism, hypertension, CKD who presents today for follow up of diabetes and to discuss chronic headaches.   1) Type 2 Diabetes:  Current medications include: metformin XR 2000 mg daily, Soliqua 25 units BID. She increased her Soliqua to 34 units once daily about 2 months ago.   She is checking her blood glucose 0 times daily. She has a freestyle libre at home, has not been wearing.   Last A1C: 7.1 in June 2023, 7.3 today Last Eye Exam: Due Last Foot Exam: Due Pneumonia Vaccination: 2022 Urine Microalbumin: UTD Statin: atorvastatin   Dietary changes since last visit: She has been working to stop eating after 7 pm but has been snacking after 7 pm recently.   She plans to get back on track with her diet.   Exercise: No regular exercise.   BP Readings from Last 3 Encounters:  06/28/22 128/64  05/12/22 120/62  05/12/22 (!) 155/79   2) Chronic Headaches: Chronic headaches for years, worse over the last 6 months. Headaches occur daily and are located to the frontal lobes and parietal lobes. She describes her headaches as like a "band-like pressure". She has never been diagnosed with migraines or tension headaches.   She has not mentioned this to our office as she attributed her headaches to fibromyalgia. She's had routine eye exams every year per her eye doctor. She doesn't take anything for her headaches. She is managed on oxycodone 5 mg for which she uses a few times a month for her chronic pain.  She's never undergone MRI of her brain.   Review of Systems  Eyes:  Negative for photophobia.  Respiratory:  Negative for shortness of breath.   Cardiovascular:  Negative for chest pain.  Neurological:  Positive for headaches. Negative for dizziness.  Psychiatric/Behavioral:  The patient is  nervous/anxious.          Past Medical History:  Diagnosis Date   Allergy    seasonal allergies   Anemia    hx of IDA   Anxiety    on meds   Arthritis    PRN meds-osteoarthritis   Cataract    sx to remove   Chronic kidney disease    CKD-stage 3   Chronic pain    COVID-19 06/02/2020   Depression    on meds   Diabetes type 2, controlled ([place])    on meds   Fibromyalgia    GERD (gastroesophageal reflux disease)    on meds   Headache    History of colonic polyps 1999   Benign   Hx of adenomatous polyp of colon 09/27/2000   2001 - diminutive adenoma [place] 2007 no polyps - [place]   Hyperlipidemia    on meds   Hypertension    on meds   Hypothyroidism    not on meds at this time   IBS (irritable bowel syndrome)    Diarrhea type   IDA (iron deficiency anemia) 02/09/2022   Neuromuscular disorder ([place])    Vitamin D deficiency     Social History   Socioeconomic History   Marital status: Married    Spouse name: Not on file   Number of children: Not on file   Years of education: Not on file   Highest education level: Not on  file  Occupational History   Not on file  Tobacco Use   Smoking status: Never   Smokeless tobacco: Never  Vaping Use   Vaping Use: Never used  Substance and Sexual Activity   Alcohol use: Yes    Alcohol/week: 0.0 standard drinks of alcohol    Comment: rarely   Drug use: No   Sexual activity: Not on file  Other Topics Concern   Not on file  Social History Narrative   Married.   3 children.   Retired, worked at [employer] in [place], [place].   Enjoys being with her grandchildren, being with her friends.   Social Determinants of Health   Financial Resource Strain: Low Risk  (02/24/2022)   Overall Financial Resource Strain (CARDIA)    Difficulty of Paying Living Expenses: Not hard at all  Food Insecurity: No Food Insecurity (02/24/2022)   Hunger Vital Sign    Worried About Running Out of Food in the Last Year: Never true    Ran Out of  Food in the Last Year: Never true  Transportation Needs: No Transportation Needs (02/24/2022)   PRAPARE - Hydrologist (Medical): No    Lack of Transportation (Non-Medical): No  Physical Activity: Insufficiently Active (02/24/2022)   Exercise Vital Sign    Days of Exercise per Week: 2 days    Minutes of Exercise per Session: 20 min  Stress: Stress Concern Present (02/24/2022)   [place]    Feeling of Stress : To some extent  Social Connections: Socially Integrated (02/24/2022)   Social Connection and Isolation Panel [NHANES]    Frequency of Communication with Friends and Family: More than three times a week    Frequency of Social Gatherings with Friends and Family: More than three times a week    Attends Religious Services: More than 4 times per year    Active Member of [employer] or Organizations: Yes    Attends Music therapist: More than 4 times per year    Marital Status: Married  Human resources officer Violence: Not At Risk (02/24/2022)   Humiliation, Afraid, Rape, and Kick questionnaire    Fear of Current or Ex-Partner: No    Emotionally Abused: No    Physically Abused: No    Sexually Abused: No    Past Surgical History:  Procedure Laterality Date   CHOLECYSTECTOMY  1995   COLONOSCOPY  multiple   COLONOSCOPY  2016   TA's   NASAL SINUS SURGERY  2001   POLYPECTOMY     TA's   TONSILLECTOMY AND ADENOIDECTOMY     VAGINAL HYSTERECTOMY     Complete   WISDOM TOOTH EXTRACTION      Family History  Problem Relation Age of Onset   Colon cancer Mother 68   Lung cancer Mother 28   Skin cancer Mother    Colon polyps Mother 63   Heart disease Father    Diabetes Father    Skin cancer Father    Colon polyps Brother 71   Stomach cancer Paternal Grandmother 80   Breast cancer Neg Hx    Esophageal cancer Neg Hx    Rectal cancer Neg Hx     No Known Allergies  Current Outpatient  Medications on File Prior to Visit  Medication Sig Dispense Refill   Accu-Chek FastClix Lancets MISC USE AS INSTRUCTED TO CHECK BLOOD SUGAR UP TO 3 TIMES DAILY 306 each 1   amLODipine (NORVASC) 10 MG  tablet Take 1 tablet (10 mg total) by mouth daily. for blood pressure 90 tablet 3   atorvastatin (LIPITOR) 40 MG tablet TAKE ONE TABLET BY MOUTH AT BEDTIME FOR CHOLESTEROL 90 tablet 3   DULoxetine (CYMBALTA) 30 MG capsule Take 1 capsule (30 mg total) by mouth daily. For pain, anxiety, depression. 90 capsule 0   glucose blood (ACCU-CHEK GUIDE) test strip USE UP TO FOUR TIMES DAILY TO CHECK BLOOD SUGAR (E10.9 E11.9) 200 each 5   Insulin Glargine-Lixisenatide (SOLIQUA) 100-33 UNT-MCG/ML SOPN INJECT 25 UNITS INTO THE SKIN TWICE DAILY FOR DIABETES 45 mL 0   Insulin Pen Needle 32G X 6 MM MISC Use as instructed to inject insulin twice daily 200 each 3   loperamide (IMODIUM A-D) 2 MG tablet Take 2 mg by mouth 4 (four) times daily as needed for diarrhea or loose stools.     losartan (COZAAR) 100 MG tablet Take 1 tablet (100 mg total) by mouth daily. for blood pressure 90 tablet 3   Magnesium 200 MG TABS Take by mouth daily.     metFORMIN (GLUCOPHAGE-XR) 500 MG 24 hr tablet Take 4 tablets (2,000 mg total) by mouth daily with breakfast. for diabetes. Office visit required for further refills. 360 tablet 0   Multiple Vitamins-Minerals (CENTRUM SILVER ULTRA WOMENS PO) Take by mouth daily.     oxyCODONE (OXY IR/ROXICODONE) 5 MG immediate release tablet      pantoprazole (PROTONIX) 40 MG tablet      promethazine (PHENERGAN) 25 MG tablet Take 25 mg by mouth every 6 (six) hours as needed. Reported on 09/21/2015  1   Cholecalciferol (VITAMIN D3) 125 MCG (5000 UT) CAPS Take 5,000 Units by mouth daily. (Patient not taking: Reported on 06/28/2022)     No current facility-administered medications on file prior to visit.    BP 128/64   Pulse 74   Temp (!) 97.2 F (36.2 C) (Temporal)   Ht '5\' 6"'$  (1.676 m)   Wt 256 lb  (116.1 kg)   SpO2 99%   BMI 41.32 kg/m  Objective:   Physical Exam Cardiovascular:     Rate and Rhythm: Normal rate and regular rhythm.  Pulmonary:     Effort: Pulmonary effort is normal.     Breath sounds: Normal breath sounds.  Musculoskeletal:     Cervical back: Neck supple.  Skin:    General: Skin is warm and dry.  Neurological:     Mental Status: She is oriented to person, place, and time.           Assessment & Plan:   Problem List Items Addressed This Visit       Endocrine   Type 2 diabetes mellitus with hyperglycemia ([place]) - Primary    Overall stable, would like to see her below 7.  Discussed to never adjust her medications without consulting with me first. Resume Soliqua 25 units BID. Continue metformin XR 2000 mg in AM.  Work on diet. Discussed the absolute need to check glucose levels, especially while on insulin.  Follow up in 4 months.       Relevant Orders   POCT glycosylated hemoglobin (Hb A1C) (Completed)     Other   Frequent headaches    No prior diagnosis of migraines, but it seems that she has chronic tension headaches.  Checking MRI brain without contrast.  Start propranolol ER 80 mg HS for headache prevention.  She will update in a few weeks.      Relevant Medications  propranolol ER (INDERAL LA) 80 MG 24 hr capsule   Other Relevant Orders   MR Brain Wo Contrast   Other Visit Diagnoses     Need for immunization against influenza       Relevant Orders   Flu Vaccine QUAD High Dose(Fluad) (Completed)          Pleas Koch, NP

## 2022-06-28 NOTE — Patient Instructions (Signed)
Start propranolol ER 80 mg for headache prevention. Take 1 capsule at bedtime.   Take your Cymbalta everyday.  Resume your Soliqua at 25 units twice daily.  Start checking your blood sugar levels.  Appropriate times to check your blood sugar levels are:  -Before any meal (breakfast, lunch, dinner) -Two hours after any meal (breakfast, lunch, dinner) -Bedtime  Record your readings and notify me if you continue to consistently run at or above 150.   Please update me in 2 weeks regarding your headaches.  Please schedule a physical to meet with me in February 2024.  It was a pleasure to see you today!

## 2022-07-07 DIAGNOSIS — M5412 Radiculopathy, cervical region: Secondary | ICD-10-CM | POA: Diagnosis not present

## 2022-07-07 DIAGNOSIS — M5416 Radiculopathy, lumbar region: Secondary | ICD-10-CM | POA: Diagnosis not present

## 2022-08-01 DIAGNOSIS — H26491 Other secondary cataract, right eye: Secondary | ICD-10-CM | POA: Diagnosis not present

## 2022-08-01 DIAGNOSIS — H43813 Vitreous degeneration, bilateral: Secondary | ICD-10-CM | POA: Diagnosis not present

## 2022-08-01 DIAGNOSIS — H02831 Dermatochalasis of right upper eyelid: Secondary | ICD-10-CM | POA: Diagnosis not present

## 2022-08-01 DIAGNOSIS — H40053 Ocular hypertension, bilateral: Secondary | ICD-10-CM | POA: Diagnosis not present

## 2022-08-01 DIAGNOSIS — Z961 Presence of intraocular lens: Secondary | ICD-10-CM | POA: Diagnosis not present

## 2022-08-01 DIAGNOSIS — E113293 Type 2 diabetes mellitus with mild nonproliferative diabetic retinopathy without macular edema, bilateral: Secondary | ICD-10-CM | POA: Diagnosis not present

## 2022-08-01 DIAGNOSIS — H02834 Dermatochalasis of left upper eyelid: Secondary | ICD-10-CM | POA: Diagnosis not present

## 2022-08-01 LAB — HM DIABETES EYE EXAM

## 2022-08-08 ENCOUNTER — Telehealth: Payer: Self-pay

## 2022-08-08 DIAGNOSIS — L905 Scar conditions and fibrosis of skin: Secondary | ICD-10-CM | POA: Diagnosis not present

## 2022-08-08 DIAGNOSIS — L821 Other seborrheic keratosis: Secondary | ICD-10-CM | POA: Diagnosis not present

## 2022-08-08 DIAGNOSIS — D224 Melanocytic nevi of scalp and neck: Secondary | ICD-10-CM | POA: Diagnosis not present

## 2022-08-08 NOTE — Chronic Care Management (AMB) (Addendum)
Chronic Care Management Pharmacy Assistant   Name: Danielle Edwards  MRN: [id number] DOB: [birth year]   Reason for Encounter: General Adherence   Attempted contact with Durwin Nora 3 times on 08/09/22,08/18/22,08/31/22. Unsuccessful outreach. Will attempt contact next month.     Recent office visits:  06/28/22-Katherine Clark,NP(PCP)-f/u DM,labs (A1c 7.3)  Discussed to never adjust her medications withoutconsulting with me first. Resume Soliqua 25 units BID. Continue metformin XR 2000 mg in AM. Flu shot given f/u 4 months.   Recent consult visits:  07/07/22-Lawrence Winikur (pain med)-no data 06/09/22-Harmeet Singh,MD(nephro)-f/u DM,CKD,Printed information on SGLT2 was provided. Patient will inquire coverage from her insurance f/u 4 months 05/12/22-Zhou Yu,MD(onc)-f/u anemia,Given that she may also have anemia secondary to chronic kidney disease, I recommend IV Venofer 200 mg x 1 to further improve her iron stores. Encourage oral hydration.  Avoid nephrotoxins.future labs ordered  F/u 6 months 04/29/22-Mark Hermann(ortho)-no data  Hospital visits:  None in previous 6 months  Medications: Outpatient Encounter Medications as of 08/08/2022  Medication Sig   Accu-Chek FastClix Lancets MISC USE AS INSTRUCTED TO CHECK BLOOD SUGAR UP TO 3 TIMES DAILY   amLODipine (NORVASC) 10 MG tablet Take 1 tablet (10 mg total) by mouth daily. for blood pressure   atorvastatin (LIPITOR) 40 MG tablet TAKE ONE TABLET BY MOUTH AT BEDTIME FOR CHOLESTEROL   Cholecalciferol (VITAMIN D3) 125 MCG (5000 UT) CAPS Take 5,000 Units by mouth daily. (Patient not taking: Reported on 06/28/2022)   DULoxetine (CYMBALTA) 30 MG capsule Take 1 capsule (30 mg total) by mouth daily. For pain, anxiety, depression.   glucose blood (ACCU-CHEK GUIDE) test strip USE UP TO FOUR TIMES DAILY TO CHECK BLOOD SUGAR (E10.9 E11.9)   Insulin Glargine-Lixisenatide (SOLIQUA) 100-33 UNT-MCG/ML SOPN INJECT 25 UNITS INTO THE SKIN TWICE DAILY FOR  DIABETES   Insulin Pen Needle 32G X 6 MM MISC Use as instructed to inject insulin twice daily   loperamide (IMODIUM A-D) 2 MG tablet Take 2 mg by mouth 4 (four) times daily as needed for diarrhea or loose stools.   losartan (COZAAR) 100 MG tablet Take 1 tablet (100 mg total) by mouth daily. for blood pressure   Magnesium 200 MG TABS Take by mouth daily.   metFORMIN (GLUCOPHAGE-XR) 500 MG 24 hr tablet Take 4 tablets (2,000 mg total) by mouth daily with breakfast. for diabetes. Office visit required for further refills.   Multiple Vitamins-Minerals (CENTRUM SILVER ULTRA WOMENS PO) Take by mouth daily.   oxyCODONE (OXY IR/ROXICODONE) 5 MG immediate release tablet    pantoprazole (PROTONIX) 40 MG tablet    promethazine (PHENERGAN) 25 MG tablet Take 25 mg by mouth every 6 (six) hours as needed. Reported on 09/21/2015   propranolol ER (INDERAL LA) 80 MG 24 hr capsule Take 1 capsule (80 mg total) by mouth daily. For headache prevention   No facility-administered encounter medications on file as of 08/08/2022.     Unsuccessful outreach    Patient is not more than 5 days past due for refill on the following medications per chart history:  Star Medications: Medication Name/mg Last Fill Days Supply Atorvastatin '40mg'$   07/06/22 90 Verified with [facility] Soliqua   06/30/22 30 Losartan '100mg'$   05/24/22 90 Metformin '500mg'$   05/24/22 90  Care Gaps: Annual wellness visit in last year? Yes Most Recent BP reading:128/64  06/28/22  If Diabetic: Most recent A1C reading:7.3  06/28/22 Last eye exam / retinopathy screening: UTD Last diabetic foot exam:  05/2021  Summary of recommendations from last  CCM Pharmacy visit (Date:04/15/22) Summary: CCM F/U visit -Pt stopped taking statin recently due to concerns for dementia association; discussed statin benefits/risks at length -Reviewed Johnson; she hasn't worn it in a couple weeks but overall DM appears well controlled (average glucose 146, TIR  88%)   Recommendations/Changes made from today's visit: -Restart atorvastatin - pt agreed. Advised pt to always contact PCP/PharmD before stopping medication  Upcoming appointments: PCP appointment on 11/29/22 and CCM appointment on 10/20/22   Charlene Brooke, CPP notified  [name], [place]  [phone]

## 2022-08-18 ENCOUNTER — Other Ambulatory Visit: Payer: Self-pay | Admitting: Primary Care

## 2022-08-18 DIAGNOSIS — E1165 Type 2 diabetes mellitus with hyperglycemia: Secondary | ICD-10-CM

## 2022-08-23 ENCOUNTER — Other Ambulatory Visit: Payer: Self-pay | Admitting: Primary Care

## 2022-08-23 DIAGNOSIS — E119 Type 2 diabetes mellitus without complications: Secondary | ICD-10-CM

## 2022-09-01 DIAGNOSIS — M5416 Radiculopathy, lumbar region: Secondary | ICD-10-CM | POA: Diagnosis not present

## 2022-09-01 DIAGNOSIS — M797 Fibromyalgia: Secondary | ICD-10-CM | POA: Diagnosis not present

## 2022-09-01 DIAGNOSIS — M5412 Radiculopathy, cervical region: Secondary | ICD-10-CM | POA: Diagnosis not present

## 2022-09-01 DIAGNOSIS — Z79899 Other long term (current) drug therapy: Secondary | ICD-10-CM | POA: Diagnosis not present

## 2022-09-06 ENCOUNTER — Inpatient Hospital Stay: Payer: Medicare HMO | Attending: Oncology

## 2022-09-06 DIAGNOSIS — D509 Iron deficiency anemia, unspecified: Secondary | ICD-10-CM | POA: Insufficient documentation

## 2022-09-06 DIAGNOSIS — D508 Other iron deficiency anemias: Secondary | ICD-10-CM

## 2022-09-06 LAB — CBC WITH DIFFERENTIAL/PLATELET
Abs Immature Granulocytes: 0.03 10*3/uL (ref 0.00–0.07)
Basophils Absolute: 0.1 10*3/uL (ref 0.0–0.1)
Basophils Relative: 1 %
Eosinophils Absolute: 0.4 10*3/uL (ref 0.0–0.5)
Eosinophils Relative: 4 %
HCT: 39.9 % (ref 36.0–46.0)
Hemoglobin: 12.6 g/dL (ref 12.0–15.0)
Immature Granulocytes: 0 %
Lymphocytes Relative: 26 %
Lymphs Abs: 2.4 10*3/uL (ref 0.7–4.0)
MCH: 29.6 pg (ref 26.0–34.0)
MCHC: 31.6 g/dL (ref 30.0–36.0)
MCV: 93.7 fL (ref 80.0–100.0)
Monocytes Absolute: 0.5 10*3/uL (ref 0.1–1.0)
Monocytes Relative: 5 %
Neutro Abs: 5.8 10*3/uL (ref 1.7–7.7)
Neutrophils Relative %: 64 %
Platelets: 237 10*3/uL (ref 150–400)
RBC: 4.26 MIL/uL (ref 3.87–5.11)
RDW: 12.5 % (ref 11.5–15.5)
WBC: 9.1 10*3/uL (ref 4.0–10.5)
nRBC: 0 % (ref 0.0–0.2)

## 2022-09-06 LAB — IRON AND TIBC
Iron: 57 ug/dL (ref 28–170)
Saturation Ratios: 15 % (ref 10.4–31.8)
TIBC: 393 ug/dL (ref 250–450)
UIBC: 336 ug/dL

## 2022-09-06 LAB — FERRITIN: Ferritin: 51 ng/mL (ref 11–307)

## 2022-09-09 ENCOUNTER — Other Ambulatory Visit: Payer: Medicare HMO

## 2022-09-12 ENCOUNTER — Inpatient Hospital Stay: Payer: Medicare HMO | Admitting: Oncology

## 2022-09-13 ENCOUNTER — Inpatient Hospital Stay: Payer: Medicare HMO | Admitting: Oncology

## 2022-09-14 ENCOUNTER — Ambulatory Visit: Payer: Medicare HMO | Admitting: Oncology

## 2022-09-24 DIAGNOSIS — U071 COVID-19: Secondary | ICD-10-CM | POA: Diagnosis not present

## 2022-09-24 DIAGNOSIS — Z20822 Contact with and (suspected) exposure to covid-19: Secondary | ICD-10-CM | POA: Diagnosis not present

## 2022-09-30 ENCOUNTER — Other Ambulatory Visit: Payer: Self-pay | Admitting: Primary Care

## 2022-09-30 DIAGNOSIS — R519 Headache, unspecified: Secondary | ICD-10-CM

## 2022-10-18 ENCOUNTER — Telehealth: Payer: Self-pay

## 2022-10-18 NOTE — Progress Notes (Signed)
Care Management & Coordination Services Pharmacy Team  Reason for Encounter: Appointment Reminder  Contacted patient on 10/18/2022    Hospital visits:  None in previous 6 months  Medications: Outpatient Encounter Medications as of 10/18/2022  Medication Sig   Accu-Chek FastClix Lancets MISC USE AS INSTRUCTED TO CHECK BLOOD SUGAR UP TO 3 TIMES DAILY   amLODipine (NORVASC) 10 MG tablet Take 1 tablet (10 mg total) by mouth daily. for blood pressure   atorvastatin (LIPITOR) 40 MG tablet TAKE ONE TABLET BY MOUTH AT BEDTIME FOR CHOLESTEROL   Cholecalciferol (VITAMIN D3) 125 MCG (5000 UT) CAPS Take 5,000 Units by mouth daily. (Patient not taking: Reported on 06/28/2022)   DULoxetine (CYMBALTA) 30 MG capsule Take 1 capsule (30 mg total) by mouth daily. For pain, anxiety, depression.   glucose blood (ACCU-CHEK GUIDE) test strip USE UP TO FOUR TIMES DAILY TO CHECK BLOOD SUGAR (E10.9 E11.9)   Insulin Pen Needle 32G X 6 MM MISC Use as instructed to inject insulin twice daily   loperamide (IMODIUM A-D) 2 MG tablet Take 2 mg by mouth 4 (four) times daily as needed for diarrhea or loose stools.   losartan (COZAAR) 100 MG tablet Take 1 tablet (100 mg total) by mouth daily. for blood pressure   Magnesium 200 MG TABS Take by mouth daily.   metFORMIN (GLUCOPHAGE-XR) 500 MG 24 hr tablet Take 4 tablets (2,000 mg total) by mouth daily with breakfast. for diabetes.   Multiple Vitamins-Minerals (CENTRUM SILVER ULTRA WOMENS PO) Take by mouth daily.   oxyCODONE (OXY IR/ROXICODONE) 5 MG immediate release tablet    pantoprazole (PROTONIX) 40 MG tablet    promethazine (PHENERGAN) 25 MG tablet Take 25 mg by mouth every 6 (six) hours as needed. Reported on 09/21/2015   propranolol ER (INDERAL LA) 80 MG 24 hr capsule TAKE 1 CAPSULE BY MOUTH ONCE DAILY FOR HEADACHE PREVENTION   SOLIQUA 100-33 UNT-MCG/ML SOPN INJECT 25 UNITS INTO THE SKIN TWICE DAILY FOR DIABETES   No facility-administered encounter medications on file  as of 10/18/2022.   Lab Results  Component Value Date/Time   HGBA1C 7.3 (A) 06/28/2022 11:17 AM   HGBA1C 7.1 (A) 03/03/2022 12:48 PM   HGBA1C 9.4 (H) 11/25/2021 01:22 PM   HGBA1C 10.9 (H) 06/11/2020 03:14 PM   MICROALBUR 4.4 (H) 08/05/2016 11:14 AM   MICROALBUR 2.0 (H) 04/07/2015 09:15 AM    BP Readings from Last 3 Encounters:  06/28/22 128/64  05/12/22 120/62  05/12/22 (!) 155/79    Unsuccessful attempt to reach patient. Left patient message reminding patient of appointment.  Patient contacted to confirm telephone appointment with Charlene Brooke,   PharmD, on 10/20/2022 at 3:00.  Have you seen any other providers since your last visit with PCP? Yes-pain management, ophthalmology     Star Rating Drugs:  Medication:  Last Fill: Day Supply Atorvastatin '40mg'$  10/17/22 90 Losartan '100mg'$  08/18/22 90 Metformin '500mg'$  08/19/22 90 Soliqua   10/07/22  30   Care Gaps: Annual wellness visit in last year? Yes  If Diabetic: Last eye exam / retinopathy screening:UTD Last diabetic foot exam:2022    Charlene Brooke, PharmD notified  [name], [place] Assistant [phone]

## 2022-10-20 ENCOUNTER — Telehealth: Payer: Self-pay | Admitting: Pharmacist

## 2022-10-20 ENCOUNTER — Encounter: Payer: Medicare HMO | Admitting: Pharmacist

## 2022-10-20 NOTE — Telephone Encounter (Signed)
Care Management & Coordination Services Outreach Note  10/20/2022 Name: Danielle Edwards MRN: [id number] DOB: 1955-12-20  Referred by: Pleas Koch, NP  Patient had a phone appointment scheduled with clinical pharmacist today.  An unsuccessful telephone outreach was attempted today. The patient was referred to the pharmacist for assistance with medications, care management and care coordination.   Patient will NOT be penalized in any way for missing a Care Management & Coordination Services appointment. The no-show fee does not apply.  If possible, a message was left to return call to: [phone] or to [facility].  Charlene Brooke, PharmD, BCACP Clinical Pharmacist [facility] at [facility] [phone]

## 2022-10-20 NOTE — Progress Notes (Unsigned)
Care Management & Coordination Services Pharmacy Note  10/20/2022 Name:  Danielle Edwards MRN:  [id number] DOB:  [birth year]  Summary: F/U visit -Pt stopped taking statin recently due to concerns for dementia association; discussed statin benefits/risks at length -Reviewed Nixa; she hasn't worn it in a couple weeks but overall DM appears well controlled (average glucose 146, TIR 88%)   Recommendations/Changes made from today's visit: -Restart atorvastatin - pt agreed. Advised pt to always contact PCP/PharmD before stopping medication  Follow up plan: -Health Concierge will call patient *** -Pharmacist follow up televisit scheduled for *** -PCP appt 11/29/22 (CPE)   Subjective: Danielle Edwards is an 67 y.o. year old female who is a primary patient of Pleas Koch, NP.  The care coordination team was consulted for assistance with disease management and care coordination needs.    Engaged with patient by telephone for follow up visit.  Recent office visits: 06/28/22 NP Allie Bossier OV: f/u - A1c 7.3%; Rx propranolol 80 mg for headache. Order MRI brain (never done)  Recent consult visits: 06/09/22 Dr Candiss Norse (Nephrology): printed info on SGLT2-I, pt will inquire ins. Coverage.  05/12/22 Dr Tasia Catchings (Oncology): f/u IDA. HGB improved 11.7. Rec IV venofer x 1 to improve iron stores.  Hospital visits: None in previous 6 months   Objective:  Lab Results  Component Value Date   CREATININE 1.19 (H) 02/09/2022   BUN 28 (H) 02/09/2022   GFR 41.66 (L) 11/25/2021   GFRNONAA 50 (L) 02/09/2022   NA 136 02/09/2022   K 4.0 02/09/2022   CALCIUM 9.2 02/09/2022   CO2 24 02/09/2022   GLUCOSE 129 (H) 02/09/2022    Lab Results  Component Value Date/Time   HGBA1C 7.3 (A) 06/28/2022 11:17 AM   HGBA1C 7.1 (A) 03/03/2022 12:48 PM   HGBA1C 9.4 (H) 11/25/2021 01:22 PM   HGBA1C 10.9 (H) 06/11/2020 03:14 PM   GFR 41.66 (L) 11/25/2021 01:22 PM   GFR 44.74 (L) 12/25/2020 11:25 AM    MICROALBUR 4.4 (H) 08/05/2016 11:14 AM   MICROALBUR 2.0 (H) 04/07/2015 09:15 AM    Last diabetic Eye exam:  Lab Results  Component Value Date/Time   HMDIABEYEEXA Retinopathy (A) 08/01/2022 12:00 AM    Last diabetic Foot exam: No results found for: "HMDIABFOOTEX"   Lab Results  Component Value Date   CHOL 104 11/25/2021   HDL 45.10 11/25/2021   LDLCALC 44 11/25/2021   LDLDIRECT 112.0 02/05/2018   TRIG 76.0 11/25/2021   CHOLHDL 2 11/25/2021       Latest Ref Rng & Units 02/09/2022   12:03 PM 11/25/2021    1:22 PM 06/11/2020    3:14 PM  Hepatic Function  Total Protein 6.5 - 8.1 g/dL 7.9  7.5  7.5   Albumin 3.5 - 5.0 g/dL 3.9  4.2  4.0   AST 15 - 41 U/L '18  14  29   '$ ALT 0 - 44 U/L '19  15  31   '$ Alk Phosphatase 38 - 126 U/L 78  69  77   Total Bilirubin 0.3 - 1.2 mg/dL 0.6  0.3  0.4     Lab Results  Component Value Date/Time   TSH 2.84 11/25/2021 01:22 PM   TSH 2.29 06/11/2020 03:14 PM       Latest Ref Rng & Units 09/06/2022    1:02 PM 05/11/2022   11:21 AM 02/09/2022   12:03 PM  CBC  WBC 4.0 - 10.5 K/uL 9.1  8.7  9.1   Hemoglobin  12.0 - 15.0 g/dL 12.6  11.7  11.0   Hematocrit 36.0 - 46.0 % 39.9  36.3  34.7   Platelets 150 - 400 K/uL 237  241  301    Iron/TIBC/Ferritin/ %Sat    Component Value Date/Time   IRON 57 09/06/2022 1302   TIBC 393 09/06/2022 1302   FERRITIN 51 09/06/2022 1302   IRONPCTSAT 15 09/06/2022 1302     Lab Results  Component Value Date/Time   VD25OH 62.85 11/25/2021 01:22 PM   VD25OH 54.23 12/25/2020 11:25 AM   VITAMINB12 444 09/17/2019 03:23 PM   VOZDGUYQ03 474 03/23/2015 11:50 AM    Clinical ASCVD: No  The ASCVD Risk score (Arnett DK, et al., 2019) failed to calculate for the following reasons:   The valid total cholesterol range is 130 to 320 mg/dL       02/24/2022    2:17 PM 02/23/2021    1:32 PM 12/08/2020    1:27 PM  Depression screen PHQ 2/9  Decreased Interest 0 0 1  Down, Depressed, Hopeless 1 0 0  PHQ - 2 Score 1 0 1  Altered  sleeping  0   Tired, decreased energy  0   Change in appetite  0   Feeling bad or failure about yourself   0   Trouble concentrating  0   Moving slowly or fidgety/restless  0   Suicidal thoughts  0   PHQ-9 Score  0   Difficult doing work/chores  Not difficult at all      Social History   Tobacco Use  Smoking Status Never  Smokeless Tobacco Never   BP Readings from Last 3 Encounters:  06/28/22 128/64  05/12/22 120/62  05/12/22 (!) 155/79   Pulse Readings from Last 3 Encounters:  06/28/22 74  05/12/22 72  05/12/22 75   Wt Readings from Last 3 Encounters:  06/28/22 256 lb (116.1 kg)  05/12/22 250 lb (113.4 kg)  03/03/22 255 lb (115.7 kg)   BMI Readings from Last 3 Encounters:  06/28/22 41.32 kg/m  05/12/22 40.35 kg/m  03/03/22 41.16 kg/m    No Known Allergies  Medications Reviewed Today     Reviewed by Pleas Koch, NP (Nurse Practitioner) on 06/28/22 at 47  Med List Status: <None>   Medication Order Taking? Sig Documenting Provider Last Dose Status Informant  Accu-Chek FastClix Lancets MISC [id number] Yes USE AS INSTRUCTED TO CHECK BLOOD SUGAR UP TO 3 TIMES DAILY Pleas Koch, NP Taking Active   amLODipine (NORVASC) 10 MG tablet [id number] Yes Take 1 tablet (10 mg total) by mouth daily. for blood pressure Pleas Koch, NP Taking Active   atorvastatin (LIPITOR) 40 MG tablet [id number] Yes TAKE ONE TABLET BY MOUTH AT BEDTIME FOR CHOLESTEROL Pleas Koch, NP Taking Active   Cholecalciferol (VITAMIN D3) 125 MCG (5000 UT) CAPS [id number] No Take 5,000 Units by mouth daily.  Patient not taking: Reported on 06/28/2022   [provider] Not Taking Active   DULoxetine (CYMBALTA) 30 MG capsule [id number] Yes Take 1 capsule (30 mg total) by mouth daily. For pain, anxiety, depression. Pleas Koch, NP Taking Active   glucose blood (ACCU-CHEK GUIDE) test strip [id number] Yes USE UP TO FOUR TIMES DAILY TO CHECK BLOOD SUGAR (E10.9 E11.9) Pleas Koch, NP Taking Active   Insulin Glargine-Lixisenatide [facility]) 100-33 UNT-MCG/ML [place] [id number] Yes INJECT 25 UNITS INTO THE SKIN TWICE DAILY FOR DIABETES Pleas Koch, NP Taking Active   Insulin Pen Needle 32G X  6 MM MISC [id number] Yes Use as instructed to inject insulin twice daily Pleas Koch, NP Taking Active   loperamide (IMODIUM A-D) 2 MG tablet [id number] Yes Take 2 mg by mouth 4 (four) times daily as needed for diarrhea or loose stools. [provider] Taking Active   losartan (COZAAR) 100 MG tablet [id number] Yes Take 1 tablet (100 mg total) by mouth daily. for blood pressure Pleas Koch, NP Taking Active   Magnesium 200 MG TABS [id number] Yes Take by mouth daily. [provider] Taking Active   metFORMIN (GLUCOPHAGE-XR) 500 MG 24 hr tablet [id number] Yes Take 4 tablets (2,000 mg total) by mouth daily with breakfast. for diabetes. Office visit required for further refills. Pleas Koch, NP Taking Active   Multiple Vitamins-Minerals (CENTRUM SILVER ULTRA WOMENS PO) [id number] Yes Take by mouth daily. [provider] Taking Active   oxyCODONE (OXY IR/ROXICODONE) 5 MG immediate release tablet [id number] Yes  [provider] Taking Active   pantoprazole (PROTONIX) 40 MG tablet [id number] Yes  [provider] Taking Active   promethazine (PHENERGAN) 25 MG tablet [id number] Yes Take 25 mg by mouth every 6 (six) hours as needed. Reported on 09/21/2015 [provider] Taking Active            Med Note Cristela Felt, [facility]   Fri Aug 05, 2016 10:19 AM)    propranolol ER (INDERAL LA) 80 MG 24 hr capsule [id number] Yes Take 1 capsule (80 mg total) by mouth daily. For headache prevention Pleas Koch, NP  Active             SDOH:  (Social Determinants of Health) assessments and interventions performed: {yes/no:20286} SDOH Interventions    Flowsheet Row Clinical Support from 02/24/2022 in [place] at  [place] from 02/23/2021 in [place] at [place] Management from 12/08/2020 in [place] at [place] from 09/12/2019 in [place] at [place] Interventions Intervention Not Indicated -- -- --  Housing Interventions Intervention Not Indicated -- -- --  Transportation Interventions Intervention Not Indicated -- -- --  Depression Interventions/Treatment  -- PHQ2-9 Score <4 Follow-up Not Indicated -- PHQ2-9 Score <4 Follow-up Not Indicated  Financial Strain Interventions Intervention Not Indicated -- Intervention Not Indicated  [Medications affordable] --  Physical Activity Interventions Intervention Not Indicated -- -- --  Stress Interventions Intervention Not Indicated -- -- --  Social Connections Interventions Intervention Not Indicated -- -- --      SDOH Screenings   Food Insecurity: No Food Insecurity (02/24/2022)  Housing: Low Risk  (02/24/2022)  Transportation Needs: No Transportation Needs (02/24/2022)  Alcohol Screen: Low Risk  (02/24/2022)  Depression (PHQ2-9): Low Risk  (02/24/2022)  Financial Resource Strain: Low Risk  (02/24/2022)  Physical Activity: Insufficiently Active (02/24/2022)  Social Connections: Socially Integrated (02/24/2022)  Stress: Stress Concern Present (02/24/2022)  Tobacco Use: Low Risk  (06/28/2022)    Medication Assistance: {MEDASSISTANCEINFO:25044}  Medication Access: Within the past 30 days, how often has patient missed a dose of medication? *** Is a pillbox or other method used to improve adherence? {YES/NO:21197} Factors that may affect medication adherence? {CHL DESC; BARRIERS:21522} Are meds synced by current pharmacy? {YES/NO:21197} Are meds delivered by current pharmacy? {YES/NO:21197} Does patient experience delays in picking up medications due to transportation concerns? {YES/NO:21197}  [facility] Reviewed: Is  patient disadvantaged to use [facility]?: {YES/NO:21197} Current Rx insurance plan: *** Name  and location of Current pharmacy:  [place], [address] [place] [address] Phone: [phone] Fax: [phone]  [facility] services reviewed with patient today?: {YES/NO:21197} Patient requests to transfer care to [facility]?: {YES/NO:21197} Reason patient declined to change pharmacies: {US patient preference:27474}  Compliance/Adherence/Medication fill history: Care Gaps: Foot exam (due 05/2022) UACR (due 08/2017) - done 06/2022 @ [place] [facility]  Star-Rating Drugs: Atorvastatin - [place] 51%; LF 10/17/22 --pt stopped atorvastatin summer 2023 but restarted and has been adherent since Losartan - PDC 100% Metformin - PDC 100%   Assessment/Plan  Hypertension / CKD Stage 3b (BP goal <140/90) -Controlled - controlled per clinic readings, no updates today, stable  -Current treatment: Losartan 100 mg  daily - Appropriate, Effective, Safe, Accessible Amlodipine 40 mg daily - Appropriate, Effective, Safe, Accessible -Medications previously tried: none reported -Current home readings: none reported, not routinely monitoring  -Denies hypotensive/hypertensive symptoms -Recommended to continue current medication  Hyperlipidemia: (LDL goal < 70) -Query Controlled - LDL 44 (11/2021) at goal, peak LDL 130 from 2017; however pt recently stopped taking atorvastatin after hearing about possible relation to dementia -Current treatment: Atorvastatin 40 mg daily - Appropriate, Effective, Safe, Accessible -Medications previously tried: none reported -Reviewed statin risks/benefits at length - statins have not been satisfactorily linked to dementia but can cause some short term (typically transient) changes in memory; ultimiately benefits outweigh risk for her, she agreed to restart statin -Recommended to continue current  medication  Diabetes (A1c goal <7%) -Not ideally controlled - A1c 7.1% (03/2022), improved from 9.4% (11/2021); pt reports overall improvement since starting [facility] -Current home glucose readings: Freestyle Libre using app  Previous AGP report: 03/17/22 to 03/30/22. Sensor active: 57%  Time in range (70-180): 88% (goal > 70%)  High (>180): 12%  Low (< 70): 0% (goal < 4%)  GMI: n/a; Average glucose: 146  -Current medications: Soliqua 25 units BID - Appropriate, Effective, Safe, Accessible Metformin XR 500 mg - 4 tab daily - Appropriate, Effective, Safe, Accessible Freestyle Libre 2 -Medications previously tried: glimepiride, Jardiance (vaginal discomfort/yeast infections), Januvia (cost), Trulicity (cost) -Educated on A1c and blood sugar goals; Prevention and management of hypoglycemic episodes; Continuous glucose monitoring; -Recommended to continue current medication; advised to start using Libre again  Health Maintenance -Vaccine gaps: None -DEXA scan 06/2015 - normal  Patient Goals/Self-Care Activities Patient will:  - take medications as prescribed as evidenced by patient report and record review focus on medication adherence by routine check glucose using Libre, document, and provide at future appointments engage in dietary modifications by limiting carb/sugar   ***

## 2022-10-25 ENCOUNTER — Other Ambulatory Visit: Payer: Self-pay | Admitting: Primary Care

## 2022-10-25 DIAGNOSIS — M5382 Other specified dorsopathies, cervical region: Secondary | ICD-10-CM | POA: Diagnosis not present

## 2022-10-25 DIAGNOSIS — M5412 Radiculopathy, cervical region: Secondary | ICD-10-CM | POA: Diagnosis not present

## 2022-10-25 DIAGNOSIS — I10 Essential (primary) hypertension: Secondary | ICD-10-CM

## 2022-10-25 DIAGNOSIS — M5416 Radiculopathy, lumbar region: Secondary | ICD-10-CM | POA: Diagnosis not present

## 2022-10-26 ENCOUNTER — Encounter: Payer: Self-pay | Admitting: Oncology

## 2022-10-26 ENCOUNTER — Inpatient Hospital Stay: Payer: Medicare HMO | Attending: Oncology | Admitting: Oncology

## 2022-10-26 VITALS — BP 150/69 | HR 75 | Temp 97.2°F | Resp 18 | Wt 275.5 lb

## 2022-10-26 DIAGNOSIS — Z86010 Personal history of colonic polyps: Secondary | ICD-10-CM | POA: Diagnosis not present

## 2022-10-26 DIAGNOSIS — Z800 Family history of malignant neoplasm of digestive organs: Secondary | ICD-10-CM | POA: Insufficient documentation

## 2022-10-26 DIAGNOSIS — D508 Other iron deficiency anemias: Secondary | ICD-10-CM

## 2022-10-26 DIAGNOSIS — Z833 Family history of diabetes mellitus: Secondary | ICD-10-CM | POA: Diagnosis not present

## 2022-10-26 DIAGNOSIS — Z801 Family history of malignant neoplasm of trachea, bronchus and lung: Secondary | ICD-10-CM | POA: Insufficient documentation

## 2022-10-26 DIAGNOSIS — Z83719 Family history of colon polyps, unspecified: Secondary | ICD-10-CM | POA: Diagnosis not present

## 2022-10-26 DIAGNOSIS — N183 Chronic kidney disease, stage 3 unspecified: Secondary | ICD-10-CM | POA: Diagnosis not present

## 2022-10-26 DIAGNOSIS — R5383 Other fatigue: Secondary | ICD-10-CM | POA: Insufficient documentation

## 2022-10-26 DIAGNOSIS — R300 Dysuria: Secondary | ICD-10-CM | POA: Diagnosis not present

## 2022-10-26 DIAGNOSIS — Z90710 Acquired absence of both cervix and uterus: Secondary | ICD-10-CM | POA: Insufficient documentation

## 2022-10-26 DIAGNOSIS — E1122 Type 2 diabetes mellitus with diabetic chronic kidney disease: Secondary | ICD-10-CM | POA: Insufficient documentation

## 2022-10-26 DIAGNOSIS — Z8719 Personal history of other diseases of the digestive system: Secondary | ICD-10-CM | POA: Insufficient documentation

## 2022-10-26 DIAGNOSIS — Z8249 Family history of ischemic heart disease and other diseases of the circulatory system: Secondary | ICD-10-CM | POA: Diagnosis not present

## 2022-10-26 DIAGNOSIS — Z8616 Personal history of COVID-19: Secondary | ICD-10-CM | POA: Insufficient documentation

## 2022-10-26 DIAGNOSIS — Z79899 Other long term (current) drug therapy: Secondary | ICD-10-CM | POA: Diagnosis not present

## 2022-10-26 DIAGNOSIS — I129 Hypertensive chronic kidney disease with stage 1 through stage 4 chronic kidney disease, or unspecified chronic kidney disease: Secondary | ICD-10-CM | POA: Insufficient documentation

## 2022-10-26 DIAGNOSIS — Z9049 Acquired absence of other specified parts of digestive tract: Secondary | ICD-10-CM | POA: Insufficient documentation

## 2022-10-26 DIAGNOSIS — D509 Iron deficiency anemia, unspecified: Secondary | ICD-10-CM | POA: Diagnosis not present

## 2022-10-26 NOTE — Assessment & Plan Note (Addendum)
#  Iron deficiency anemia, Labs are reviewed and discussed with patient.  Both hemoglobin and iron panel have improved.  No medical treatment is needed.  Recommend patient to take OTC iron supplementation.

## 2022-10-26 NOTE — Progress Notes (Signed)
Pt here for follow up. Pt reports that she is having burning and pain with urination.

## 2022-10-26 NOTE — Assessment & Plan Note (Signed)
Encourage oral hydration and avoid nephrotoxins.   

## 2022-10-26 NOTE — Progress Notes (Signed)
Hematology/Oncology Progress note [phone] [phone]      CHIEF COMPLAINTS/REASON FOR VISIT:  Follow-up for iron deficiency anemia  ASSESSMENT & PLAN:   IDA (iron deficiency anemia) #Iron deficiency anemia, Labs are reviewed and discussed with patient.  Both hemoglobin and iron panel have improved.  No medical treatment is needed.  Recommend patient to take OTC iron supplementation.   CKD (chronic kidney disease) stage 3, GFR 30-59 ml/min (HCC) Encourage oral hydration and avoid nephrotoxins.   Chronic urinary burning sensation Previous infectious workup negative.  I encourage patient to discuss with primary care provider for further evaluation, differential diagnosis includes infection, postmenopausal syndromes etc.  Orders Placed This Encounter  Procedures   CBC with Differential/Platelet    Standing Status:   Future    Standing Expiration Date:   10/26/2023   Ferritin    Standing Status:   Future    Standing Expiration Date:   10/27/2023   Iron and TIBC([facility])    Standing Status:   Future    Standing Expiration Date:   10/27/2023   Follow-up in 6 months All questions were answered. The patient knows to call the clinic with any problems, questions or concerns.  Earlie Server, MD, PhD [facility] Hematology Oncology 10/26/2022     HISTORY OF PRESENTING ILLNESS:   Danielle Edwards is a  67 y.o.  female presents for iron deficiency anemia management. Patient reports feeling well.  Fatigue has improved.  She tolerates IV Venofer treatments Patient reports he chronic urination burning sensation for several months.  Previous urine analysis and culture were negative.   Review of Systems  Constitutional:  Negative for appetite change, chills, fatigue and fever.  HENT:   Negative for hearing loss and voice change.   Eyes:  Negative for eye problems.  Respiratory:  Negative for chest tightness and cough.   Cardiovascular:  Negative for chest  pain.  Gastrointestinal:  Negative for abdominal distention, abdominal pain, blood in stool, nausea and vomiting.  Endocrine: Negative for hot flashes.  Genitourinary:  Positive for dysuria. Negative for difficulty urinating and frequency.   Musculoskeletal:  Negative for arthralgias.  Skin:  Negative for itching and rash.  Neurological:  Negative for extremity weakness.  Hematological:  Negative for adenopathy.  Psychiatric/Behavioral:  Negative for confusion.     MEDICAL HISTORY:  Past Medical History:  Diagnosis Date   Allergy    seasonal allergies   Anemia    hx of IDA   Anxiety    on meds   Arthritis    PRN meds-osteoarthritis   Cataract    sx to remove   Chronic kidney disease    CKD-stage 3   Chronic pain    COVID-19 06/02/2020   Depression    on meds   Diabetes type 2, controlled ([place])    on meds   Fibromyalgia    GERD (gastroesophageal reflux disease)    on meds   Headache    History of colonic polyps 1999   Benign   Hx of adenomatous polyp of colon 09/27/2000   2001 - diminutive adenoma [place] 2007 no polyps - [place]   Hyperlipidemia    on meds   Hypertension    on meds   Hypothyroidism    not on meds at this time   IBS (irritable bowel syndrome)    Diarrhea type   IDA (iron deficiency anemia) 02/09/2022   Neuromuscular disorder ([place])    Vitamin D deficiency     SURGICAL HISTORY:  Past Surgical History:  Procedure Laterality Date   CHOLECYSTECTOMY  1995   COLONOSCOPY  multiple   COLONOSCOPY  2016   TA's   NASAL SINUS SURGERY  2001   POLYPECTOMY     TA's   TONSILLECTOMY AND ADENOIDECTOMY     VAGINAL HYSTERECTOMY     Complete   WISDOM TOOTH EXTRACTION      SOCIAL HISTORY: Social History   Socioeconomic History   Marital status: Married    Spouse name: Not on file   Number of children: Not on file   Years of education: Not on file   Highest education level: Not on file  Occupational History   Not on file  Tobacco Use   Smoking  status: Never   Smokeless tobacco: Never  Vaping Use   Vaping Use: Never used  Substance and Sexual Activity   Alcohol use: Yes    Alcohol/week: 0.0 standard drinks of alcohol    Comment: rarely   Drug use: No   Sexual activity: Not on file  Other Topics Concern   Not on file  Social History Narrative   Married.   3 children.   Retired, worked at [employer] in [place], [place].   Enjoys being with her grandchildren, being with her friends.   Social Determinants of Health   Financial Resource Strain: Low Risk  (02/24/2022)   Overall Financial Resource Strain (CARDIA)    Difficulty of Paying Living Expenses: Not hard at all  Food Insecurity: No Food Insecurity (02/24/2022)   Hunger Vital Sign    Worried About Running Out of Food in the Last Year: Never true    Ran Out of Food in the Last Year: Never true  Transportation Needs: No Transportation Needs (02/24/2022)   PRAPARE - Hydrologist (Medical): No    Lack of Transportation (Non-Medical): No  Physical Activity: Insufficiently Active (02/24/2022)   Exercise Vital Sign    Days of Exercise per Week: 2 days    Minutes of Exercise per Session: 20 min  Stress: Stress Concern Present (02/24/2022)   [place]    Feeling of Stress : To some extent  Social Connections: Socially Integrated (02/24/2022)   Social Connection and Isolation Panel [NHANES]    Frequency of Communication with Friends and Family: More than three times a week    Frequency of Social Gatherings with Friends and Family: More than three times a week    Attends Religious Services: More than 4 times per year    Active Member of [employer] or Organizations: Yes    Attends Music therapist: More than 4 times per year    Marital Status: Married  Human resources officer Violence: Not At Risk (02/24/2022)   Humiliation, Afraid, Rape, and Kick questionnaire    Fear of Current or  Ex-Partner: No    Emotionally Abused: No    Physically Abused: No    Sexually Abused: No    FAMILY HISTORY: Family History  Problem Relation Age of Onset   Colon cancer Mother 56   Lung cancer Mother 24   Skin cancer Mother    Colon polyps Mother 69   Heart disease Father    Diabetes Father    Skin cancer Father    Colon polyps Brother 1   Stomach cancer Paternal Grandmother 77   Breast cancer Neg Hx    Esophageal cancer Neg Hx    Rectal cancer Neg Hx  ALLERGIES:  has No Known Allergies.  MEDICATIONS:  Current Outpatient Medications  Medication Sig Dispense Refill   Accu-Chek FastClix Lancets MISC USE AS INSTRUCTED TO CHECK BLOOD SUGAR UP TO 3 TIMES DAILY 306 each 1   amLODipine (NORVASC) 10 MG tablet TAKE 1 TABLET BY MOUTH ONCE A DAY FOR BLOOD PRESSURE 90 tablet 0   atorvastatin (LIPITOR) 40 MG tablet TAKE ONE TABLET BY MOUTH AT BEDTIME FOR CHOLESTEROL 90 tablet 3   Cholecalciferol (VITAMIN D3) 125 MCG (5000 UT) CAPS Take 5,000 Units by mouth daily.     glucose blood (ACCU-CHEK GUIDE) test strip USE UP TO FOUR TIMES DAILY TO CHECK BLOOD SUGAR (E10.9 E11.9) 200 each 5   Insulin Pen Needle 32G X 6 MM MISC Use as instructed to inject insulin twice daily 200 each 3   loperamide (IMODIUM A-D) 2 MG tablet Take 2 mg by mouth 4 (four) times daily as needed for diarrhea or loose stools.     losartan (COZAAR) 100 MG tablet TAKE 1 TABLET BY MOUTH ONCE A DAY FOR BLOOD PRESSURE 90 tablet 0   Magnesium 200 MG TABS Take by mouth daily.     metFORMIN (GLUCOPHAGE-XR) 500 MG 24 hr tablet Take 4 tablets (2,000 mg total) by mouth daily with breakfast. for diabetes. 360 tablet 0   Multiple Vitamins-Minerals (CENTRUM SILVER ULTRA WOMENS PO) Take by mouth daily.     oxyCODONE (OXY IR/ROXICODONE) 5 MG immediate release tablet      pantoprazole (PROTONIX) 40 MG tablet      promethazine (PHENERGAN) 25 MG tablet Take 25 mg by mouth every 6 (six) hours as needed. Reported on 09/21/2015  1    propranolol ER (INDERAL LA) 80 MG 24 hr capsule TAKE 1 CAPSULE BY MOUTH ONCE DAILY FOR HEADACHE PREVENTION 90 capsule 0   SOLIQUA 100-33 UNT-MCG/ML SOPN INJECT 25 UNITS INTO THE SKIN TWICE DAILY FOR DIABETES 45 mL 0   DULoxetine (CYMBALTA) 30 MG capsule Take 1 capsule (30 mg total) by mouth daily. For pain, anxiety, depression. (Patient not taking: Reported on 10/26/2022) 90 capsule 0   No current facility-administered medications for this visit.     PHYSICAL EXAMINATION: ECOG PERFORMANCE STATUS: 1 - Symptomatic but completely ambulatory Vitals:   10/26/22 1101  BP: (!) 150/69  Pulse: 75  Resp: 18  Temp: (!) 97.2 F (36.2 C)   Filed Weights   10/26/22 1101  Weight: 275 lb 8 oz (125 kg)    Physical Exam Constitutional:      General: She is not in acute distress.    Appearance: She is obese.  HENT:     Head: Normocephalic and atraumatic.  Eyes:     General: No scleral icterus. Cardiovascular:     Rate and Rhythm: Normal rate and regular rhythm.     Heart sounds: Normal heart sounds.  Pulmonary:     Effort: Pulmonary effort is normal. No respiratory distress.     Breath sounds: No wheezing.  Abdominal:     General: Bowel sounds are normal. There is no distension.     Palpations: Abdomen is soft.  Musculoskeletal:        General: No deformity. Normal range of motion.     Cervical back: Normal range of motion and neck supple.  Skin:    General: Skin is warm and dry.     Findings: No erythema or rash.  Neurological:     Mental Status: She is alert and oriented to person, place, and time. Mental  status is at baseline.     Cranial Nerves: No cranial nerve deficit.     Coordination: Coordination normal.  Psychiatric:        Mood and Affect: Mood normal.     LABORATORY DATA:  I have reviewed the data as listed     Latest Ref Rng & Units 09/06/2022    1:02 PM 05/11/2022   11:21 AM 02/09/2022   12:03 PM  CBC  WBC 4.0 - 10.5 K/uL 9.1  8.7  9.1   Hemoglobin 12.0 - 15.0  g/dL 12.6  11.7  11.0   Hematocrit 36.0 - 46.0 % 39.9  36.3  34.7   Platelets 150 - 400 K/uL 237  241  301       Latest Ref Rng & Units 02/09/2022   12:03 PM 01/03/2022    1:09 PM 11/25/2021    1:22 PM  CMP  Glucose 70 - 99 mg/dL 129   136   BUN 8 - 23 mg/dL 28   35   Creatinine 0.44 - 1.00 mg/dL 1.19   1.33   Sodium 135 - 145 mmol/L 136   141   Potassium 3.5 - 5.1 mmol/L 4.0  4.4  5.2 No hemolysis seen   Chloride 98 - 111 mmol/L 105   107   CO2 22 - 32 mmol/L 24   28   Calcium 8.9 - 10.3 mg/dL 9.2   10.0   Total Protein 6.5 - 8.1 g/dL 7.9   7.5   Total Bilirubin 0.3 - 1.2 mg/dL 0.6   0.3   Alkaline Phos 38 - 126 U/L 78   69   AST 15 - 41 U/L 18   14   ALT 0 - 44 U/L 19   15     Iron/TIBC/Ferritin/ %Sat    Component Value Date/Time   IRON 57 09/06/2022 1302   TIBC 393 09/06/2022 1302   FERRITIN 51 09/06/2022 1302   IRONPCTSAT 15 09/06/2022 1302      RADIOGRAPHIC STUDIES: I have personally reviewed the radiological images as listed and agreed with the findings in the report. No results found.

## 2022-11-04 ENCOUNTER — Other Ambulatory Visit: Payer: Self-pay | Admitting: Primary Care

## 2022-11-04 DIAGNOSIS — E119 Type 2 diabetes mellitus without complications: Secondary | ICD-10-CM

## 2022-11-11 ENCOUNTER — Other Ambulatory Visit
Admission: RE | Admit: 2022-11-11 | Discharge: 2022-11-11 | Disposition: A | Discharge status: Home / Self Care | Payer: Medicare HMO | Source: Ambulatory Visit | Attending: Nephrology | Admitting: Nephrology

## 2022-11-11 DIAGNOSIS — E1122 Type 2 diabetes mellitus with diabetic chronic kidney disease: Secondary | ICD-10-CM | POA: Diagnosis not present

## 2022-11-11 DIAGNOSIS — I129 Hypertensive chronic kidney disease with stage 1 through stage 4 chronic kidney disease, or unspecified chronic kidney disease: Secondary | ICD-10-CM | POA: Insufficient documentation

## 2022-11-11 DIAGNOSIS — D508 Other iron deficiency anemias: Secondary | ICD-10-CM | POA: Diagnosis not present

## 2022-11-11 DIAGNOSIS — G4733 Obstructive sleep apnea (adult) (pediatric): Secondary | ICD-10-CM | POA: Insufficient documentation

## 2022-11-11 DIAGNOSIS — N1831 Chronic kidney disease, stage 3a: Secondary | ICD-10-CM | POA: Diagnosis not present

## 2022-11-11 LAB — RENAL FUNCTION PANEL
Albumin: 3.7 g/dL (ref 3.5–5.0)
Anion gap: 8 (ref 5–15)
BUN: 33 mg/dL — ABNORMAL HIGH (ref 8–23)
CO2: 26 mmol/L (ref 22–32)
Calcium: 10 mg/dL (ref 8.9–10.3)
Chloride: 100 mmol/L (ref 98–111)
Creatinine, Ser: 1.14 mg/dL — ABNORMAL HIGH (ref 0.44–1.00)
GFR, Estimated: 53 mL/min — ABNORMAL LOW
Glucose, Bld: 133 mg/dL — ABNORMAL HIGH (ref 70–99)
Phosphorus: 4.4 mg/dL (ref 2.5–4.6)
Potassium: 4.2 mmol/L (ref 3.5–5.1)
Sodium: 134 mmol/L — ABNORMAL LOW (ref 135–145)

## 2022-11-11 LAB — CBC WITH DIFFERENTIAL/PLATELET
Abs Immature Granulocytes: 0.02 10*3/uL (ref 0.00–0.07)
Basophils Absolute: 0.1 10*3/uL (ref 0.0–0.1)
Basophils Relative: 1 %
Eosinophils Absolute: 0.3 10*3/uL (ref 0.0–0.5)
Eosinophils Relative: 4 %
HCT: 37.5 % (ref 36.0–46.0)
Hemoglobin: 11.9 g/dL — ABNORMAL LOW (ref 12.0–15.0)
Immature Granulocytes: 0 %
Lymphocytes Relative: 27 %
Lymphs Abs: 2.2 10*3/uL (ref 0.7–4.0)
MCH: 29.5 pg (ref 26.0–34.0)
MCHC: 31.7 g/dL (ref 30.0–36.0)
MCV: 93.1 fL (ref 80.0–100.0)
Monocytes Absolute: 0.5 10*3/uL (ref 0.1–1.0)
Monocytes Relative: 6 %
Neutro Abs: 4.9 10*3/uL (ref 1.7–7.7)
Neutrophils Relative %: 62 %
Platelets: 268 10*3/uL (ref 150–400)
RBC: 4.03 MIL/uL (ref 3.87–5.11)
RDW: 12.7 % (ref 11.5–15.5)
WBC: 8 10*3/uL (ref 4.0–10.5)
nRBC: 0 % (ref 0.0–0.2)

## 2022-11-11 LAB — URINALYSIS, COMPLETE (UACMP) WITH MICROSCOPIC
Bacteria, UA: NONE SEEN
Bilirubin Urine: NEGATIVE
Glucose, UA: NEGATIVE mg/dL
Hgb urine dipstick: NEGATIVE
Ketones, ur: NEGATIVE mg/dL
Leukocytes,Ua: NEGATIVE
Nitrite: NEGATIVE
Protein, ur: NEGATIVE mg/dL
Specific Gravity, Urine: 1.013 (ref 1.005–1.030)
pH: 6 (ref 5.0–8.0)

## 2022-11-11 LAB — PROTEIN / CREATININE RATIO, URINE
Creatinine, Urine: 68 mg/dL
Protein Creatinine Ratio: 0.1 mg/mg{creat} (ref 0.00–0.15)
Total Protein, Urine: 7 mg/dL

## 2022-11-11 LAB — MAGNESIUM: Magnesium: 2 mg/dL (ref 1.7–2.4)

## 2022-11-13 LAB — PTH, INTACT AND CALCIUM
Calcium, Total (PTH): 10.7 mg/dL — ABNORMAL HIGH (ref 8.7–10.3)
PTH: 15 pg/mL (ref 15–65)

## 2022-11-14 ENCOUNTER — Other Ambulatory Visit: Payer: Medicare HMO

## 2022-11-16 DIAGNOSIS — E1122 Type 2 diabetes mellitus with diabetic chronic kidney disease: Secondary | ICD-10-CM | POA: Diagnosis not present

## 2022-11-16 DIAGNOSIS — R600 Localized edema: Secondary | ICD-10-CM | POA: Diagnosis not present

## 2022-11-16 DIAGNOSIS — I10 Essential (primary) hypertension: Secondary | ICD-10-CM | POA: Diagnosis not present

## 2022-11-16 DIAGNOSIS — N1831 Chronic kidney disease, stage 3a: Secondary | ICD-10-CM | POA: Diagnosis not present

## 2022-11-17 ENCOUNTER — Ambulatory Visit: Payer: Medicare HMO | Admitting: Oncology

## 2022-11-23 ENCOUNTER — Other Ambulatory Visit: Payer: Self-pay | Admitting: Primary Care

## 2022-11-23 ENCOUNTER — Telehealth: Payer: Self-pay | Admitting: Primary Care

## 2022-11-23 DIAGNOSIS — E1165 Type 2 diabetes mellitus with hyperglycemia: Secondary | ICD-10-CM

## 2022-11-23 MED ORDER — METFORMIN HCL ER 500 MG PO TB24: 2000.0000 mg | ORAL_TABLET | Freq: Every day | ORAL | 0 refills | Status: DC

## 2022-11-23 NOTE — Telephone Encounter (Signed)
From: Durwin Nora To: Office of Pleas Koch, NP Sent: 11/23/2022 11:18 AM EST Subject: Medication Renewal Request  Refills have been requested for the following medications:   metFORMIN (GLUCOPHAGE-XR) 500 MG 24 hr tablet [Rachit Grim K Natalya Domzalski]  Preferred pharmacy: [facility], [place] Delivery method: [employer]

## 2022-11-23 NOTE — Telephone Encounter (Signed)
Refills sent to pharmacy. 

## 2022-11-23 NOTE — Telephone Encounter (Signed)
Prescription Request  11/23/2022  Is this a "Controlled Substance" medicine? No  LOV: 06/28/2022  What is the name of the medication or equipment?  metFORMIN metFORMIN (GLUCOPHAGE-XR) 500 MG 24 hr tablet  Have you contacted your pharmacy to request a refill? Yes   Which pharmacy would you like this sent to?  [facility], [place] [address] Phone: [phone] Fax: [phone]    Patient notified that their request is being sent to the clinical staff for review and that they should receive a response within 2 business days.   Please advise at Mobile [phone] (mobile)

## 2022-11-29 ENCOUNTER — Encounter: Payer: Medicare HMO | Admitting: Primary Care

## 2022-12-02 DIAGNOSIS — E119 Type 2 diabetes mellitus without complications: Secondary | ICD-10-CM | POA: Diagnosis not present

## 2022-12-15 DIAGNOSIS — M5412 Radiculopathy, cervical region: Secondary | ICD-10-CM | POA: Diagnosis not present

## 2022-12-15 DIAGNOSIS — M5416 Radiculopathy, lumbar region: Secondary | ICD-10-CM | POA: Diagnosis not present

## 2022-12-31 ENCOUNTER — Other Ambulatory Visit: Payer: Self-pay | Admitting: Primary Care

## 2022-12-31 DIAGNOSIS — R519 Headache, unspecified: Secondary | ICD-10-CM

## 2023-01-09 ENCOUNTER — Other Ambulatory Visit: Payer: Self-pay | Admitting: Primary Care

## 2023-01-09 DIAGNOSIS — E1165 Type 2 diabetes mellitus with hyperglycemia: Secondary | ICD-10-CM

## 2023-01-09 NOTE — Telephone Encounter (Signed)
Patient scheduled on 4.26

## 2023-01-09 NOTE — Telephone Encounter (Signed)
Patient is overdue for CPE/follow up, this will be required prior to any further refills.  Please schedule, thank you!   

## 2023-01-25 ENCOUNTER — Telehealth: Payer: Self-pay | Admitting: Primary Care

## 2023-01-25 NOTE — Telephone Encounter (Signed)
Contacted Danielle Edwards to schedule their annual wellness visit. Appointment made for 03/08/2023.  [facility] Care Guide [facility] AWV TEAM Direct Dial: [phone]

## 2023-01-27 ENCOUNTER — Encounter: Payer: Medicare HMO | Admitting: Primary Care

## 2023-01-30 ENCOUNTER — Other Ambulatory Visit: Payer: Self-pay | Admitting: Primary Care

## 2023-01-30 DIAGNOSIS — I10 Essential (primary) hypertension: Secondary | ICD-10-CM

## 2023-02-01 ENCOUNTER — Encounter: Payer: Self-pay | Admitting: Primary Care

## 2023-02-01 ENCOUNTER — Ambulatory Visit (INDEPENDENT_AMBULATORY_CARE_PROVIDER_SITE_OTHER): Payer: Medicare HMO | Admitting: Primary Care

## 2023-02-01 ENCOUNTER — Other Ambulatory Visit: Payer: Self-pay | Admitting: Primary Care

## 2023-02-01 VITALS — BP 130/82 | HR 75 | Temp 98.4°F | Ht 66.0 in | Wt 277.0 lb

## 2023-02-01 DIAGNOSIS — N1831 Chronic kidney disease, stage 3a: Secondary | ICD-10-CM

## 2023-02-01 DIAGNOSIS — Z800 Family history of malignant neoplasm of digestive organs: Secondary | ICD-10-CM

## 2023-02-01 DIAGNOSIS — F32A Depression, unspecified: Secondary | ICD-10-CM

## 2023-02-01 DIAGNOSIS — E1169 Type 2 diabetes mellitus with other specified complication: Secondary | ICD-10-CM

## 2023-02-01 DIAGNOSIS — R0683 Snoring: Secondary | ICD-10-CM

## 2023-02-01 DIAGNOSIS — F419 Anxiety disorder, unspecified: Secondary | ICD-10-CM | POA: Diagnosis not present

## 2023-02-01 DIAGNOSIS — E1165 Type 2 diabetes mellitus with hyperglycemia: Secondary | ICD-10-CM

## 2023-02-01 DIAGNOSIS — G894 Chronic pain syndrome: Secondary | ICD-10-CM

## 2023-02-01 DIAGNOSIS — E785 Hyperlipidemia, unspecified: Secondary | ICD-10-CM

## 2023-02-01 DIAGNOSIS — I10 Essential (primary) hypertension: Secondary | ICD-10-CM

## 2023-02-01 DIAGNOSIS — R102 Pelvic and perineal pain: Secondary | ICD-10-CM

## 2023-02-01 DIAGNOSIS — K219 Gastro-esophageal reflux disease without esophagitis: Secondary | ICD-10-CM | POA: Diagnosis not present

## 2023-02-01 DIAGNOSIS — R519 Headache, unspecified: Secondary | ICD-10-CM | POA: Diagnosis not present

## 2023-02-01 DIAGNOSIS — D508 Other iron deficiency anemias: Secondary | ICD-10-CM

## 2023-02-01 DIAGNOSIS — E039 Hypothyroidism, unspecified: Secondary | ICD-10-CM | POA: Diagnosis not present

## 2023-02-01 DIAGNOSIS — Z794 Long term (current) use of insulin: Secondary | ICD-10-CM

## 2023-02-01 DIAGNOSIS — R600 Localized edema: Secondary | ICD-10-CM

## 2023-02-01 DIAGNOSIS — E2839 Other primary ovarian failure: Secondary | ICD-10-CM

## 2023-02-01 DIAGNOSIS — Z1231 Encounter for screening mammogram for malignant neoplasm of breast: Secondary | ICD-10-CM

## 2023-02-01 DIAGNOSIS — Z0000 Encounter for general adult medical examination without abnormal findings: Secondary | ICD-10-CM

## 2023-02-01 LAB — LIPID PANEL
Cholesterol: 198 mg/dL (ref 0–200)
HDL: 53.4 mg/dL (ref >39.00)
NonHDL: 144.71
Total CHOL/HDL Ratio: 4
Triglycerides: 202 mg/dL — ABNORMAL HIGH (ref 0.0–149.0)
VLDL: 40.4 mg/dL — ABNORMAL HIGH (ref 0.0–40.0)

## 2023-02-01 LAB — LDL CHOLESTEROL, DIRECT: Direct LDL: 117 mg/dL

## 2023-02-01 LAB — POCT GLYCOSYLATED HEMOGLOBIN (HGB A1C): Hemoglobin A1C: 8.2 % — AB (ref 4.0–5.6)

## 2023-02-01 LAB — URINALYSIS, ROUTINE W REFLEX MICROSCOPIC
Bilirubin Urine: NEGATIVE
Hgb urine dipstick: NEGATIVE
Ketones, ur: NEGATIVE
Leukocytes,Ua: NEGATIVE
Nitrite: NEGATIVE
RBC / HPF: NONE SEEN (ref 0–?)
Specific Gravity, Urine: 1.025 (ref 1.000–1.030)
Total Protein, Urine: NEGATIVE
Urine Glucose: NEGATIVE
Urobilinogen, UA: 0.2 (ref 0.0–1.0)
pH: 6 (ref 5.0–8.0)

## 2023-02-01 LAB — T4, FREE: Free T4: 0.92 ng/dL (ref 0.60–1.60)

## 2023-02-01 LAB — TSH: TSH: 3.23 u[IU]/mL (ref 0.35–5.50)

## 2023-02-01 MED ORDER — LANTUS SOLOSTAR 100 UNIT/ML ~~LOC~~ SOPN: 20.0000 [IU] | PEN_INJECTOR | Freq: Every day | SUBCUTANEOUS | 0 refills | Status: DC

## 2023-02-01 MED ORDER — OZEMPIC (0.25 OR 0.5 MG/DOSE) 2 MG/3ML ~~LOC~~ SOPN: PEN_INJECTOR | SUBCUTANEOUS | 0 refills | Status: DC

## 2023-02-01 NOTE — Assessment & Plan Note (Addendum)
Controlled.  Continue losartan 100 mg daily, amlodipine 10 mg daily. Renal function reviewed from February 2024 per nephrology

## 2023-02-01 NOTE — Assessment & Plan Note (Signed)
Reviewed pulmonology notes from May 2023. She never followed through on treatment.   Recommended she schedule a follow up visit for treatment.

## 2023-02-01 NOTE — Assessment & Plan Note (Signed)
Immunizations UTD. Mammogram and bone density scan due, orders placed. Colonoscopy UTD, due October 2024. Discussed with patient  Discussed the importance of a healthy diet and regular exercise in order for weight loss, and to reduce the risk of further co-morbidity.  Exam stable. Labs pending.  Follow up in 1 year for repeat physical.

## 2023-02-01 NOTE — Assessment & Plan Note (Signed)
Following with pain management.   Continue oxycodone IR 5 mg PRN.

## 2023-02-01 NOTE — Assessment & Plan Note (Signed)
Following with hematology. Reviewed office notes from February 2024.   Continue oral iron daily.

## 2023-02-01 NOTE — Assessment & Plan Note (Signed)
Remain of treatment. Repeat TSH pending.

## 2023-02-01 NOTE — Progress Notes (Signed)
Subjective:    Patient ID: Danielle Edwards, female    DOB: September 10, 1956, 67 y.o.   MRN: [id number]  HPI  Danielle Edwards is a very pleasant 67 y.o. female with a history of type 2 diabetes, hypertension, hypothyroidism, CKD, anxiety and depression, hyperlipidemia, who presents today for complete physical and follow up of chronic conditions.  She is interested in trying Ozempic for weight loss and diabetes. She's gained 20 pounds since her last visit due to cravings and increased food intake. Her friends have lost a lot of weight on Ozempic.    Immunizations: -Influenza: Completed this season -Shingles: Completed Shingrix series -Pneumonia: Completed Prevnar 20 in 2022 and pneumovax in 2016  Diet: Poor diet.  Exercise: No regular exercise.  Eye exam: Completes annually  Dental exam: Completes semi-annually    Mammogram: May 2023 Bone Density Scan: 2016  Colonoscopy: Completed in 2021, due October 2024  Wt Readings from Last 3 Encounters:  02/01/23 277 lb (125.6 kg)  10/26/22 275 lb 8 oz (125 kg)  06/28/22 256 lb (116.1 kg)   BP Readings from Last 3 Encounters:  02/01/23 130/82  10/26/22 (!) 150/69  06/28/22 128/64         Review of Systems  Constitutional:  Negative for unexpected weight change.  HENT:  Positive for dental problem. Negative for rhinorrhea.   Eyes:  Negative for visual disturbance.  Respiratory:  Negative for cough and shortness of breath.   Cardiovascular:  Negative for chest pain.  Gastrointestinal:  Negative for constipation and diarrhea.  Genitourinary:  Negative for difficulty urinating.  Musculoskeletal:  Positive for arthralgias.  Skin:  Negative for rash.  Allergic/Immunologic: Negative for environmental allergies.  Neurological:  Positive for headaches. Negative for dizziness.  Psychiatric/Behavioral:  The patient is nervous/anxious.          Past Medical History:  Diagnosis Date   Allergy    seasonal allergies   Anemia    hx of  IDA   Anxiety    on meds   Arthritis    PRN meds-osteoarthritis   Cataract    sx to remove   Chronic kidney disease    CKD-stage 3   Chronic pain    COVID-19 06/02/2020   Depression    on meds   Diabetes type 2, controlled (HCC)    on meds   Fibromyalgia    GERD (gastroesophageal reflux disease)    on meds   Headache    History of colonic polyps 1999   Benign   Hx of adenomatous polyp of colon 09/27/2000   2001 - diminutive adenoma [place] 2007 no polyps - [place]   Hyperlipidemia    on meds   Hypertension    on meds   Hypothyroidism    not on meds at this time   IBS (irritable bowel syndrome)    Diarrhea type   IDA (iron deficiency anemia) 02/09/2022   Neuromuscular disorder (HCC)    Vitamin D deficiency     Social History   Socioeconomic History   Marital status: Married    Spouse name: Not on file   Number of children: Not on file   Years of education: Not on file   Highest education level: 12th grade  Occupational History   Not on file  Tobacco Use   Smoking status: Never   Smokeless tobacco: Never  Vaping Use   Vaping Use: Never used  Substance and Sexual Activity   Alcohol use: Yes    Alcohol/week: 0.0 standard  drinks of alcohol    Comment: rarely   Drug use: No   Sexual activity: Not on file  Other Topics Concern   Not on file  Social History Narrative   Married.   3 children.   Retired, worked at [employer] in [place], [place].   Enjoys being with her grandchildren, being with her friends.   Social Determinants of Health   Financial Resource Strain: Low Risk  (01/31/2023)   Overall Financial Resource Strain (CARDIA)    Difficulty of Paying Living Expenses: Not very hard  Food Insecurity: No Food Insecurity (01/31/2023)   Hunger Vital Sign    Worried About Running Out of Food in the Last Year: Never true    Ran Out of Food in the Last Year: Never true  Transportation Needs: No Transportation Needs (01/31/2023)   PRAPARE - Therapist, art (Medical): No    Lack of Transportation (Non-Medical): No  Physical Activity: Inactive (01/31/2023)   Exercise Vital Sign    Days of Exercise per Week: 0 days    Minutes of Exercise per Session: 20 min  Stress: Stress Concern Present (01/31/2023)   [employer] of Occupational Health - Occupational Stress Questionnaire    Feeling of Stress : To some extent  Social Connections: Socially Integrated (01/31/2023)   Social Connection and Isolation Panel [NHANES]    Frequency of Communication with Friends and Family: More than three times a week    Frequency of Social Gatherings with Friends and Family: Twice a week    Attends Religious Services: More than 4 times per year    Active Member of [employer] or Organizations: No    Attends Engineer, structural: More than 4 times per year    Marital Status: Married  Catering manager Violence: Not At Risk (02/24/2022)   Humiliation, Afraid, Rape, and Kick questionnaire    Fear of Current or Ex-Partner: No    Emotionally Abused: No    Physically Abused: No    Sexually Abused: No    Past Surgical History:  Procedure Laterality Date   CHOLECYSTECTOMY  1995   COLONOSCOPY  multiple   COLONOSCOPY  2016   TA's   NASAL SINUS SURGERY  2001   POLYPECTOMY     TA's   TONSILLECTOMY AND ADENOIDECTOMY     VAGINAL HYSTERECTOMY     Complete   WISDOM TOOTH EXTRACTION      Family History  Problem Relation Age of Onset   Colon cancer Mother 70   Lung cancer Mother 39   Skin cancer Mother    Colon polyps Mother 66   Heart disease Father    Diabetes Father    Skin cancer Father    Colon polyps Brother 17   Stomach cancer Paternal Grandmother 72   Breast cancer Neg Hx    Esophageal cancer Neg Hx    Rectal cancer Neg Hx     No Known Allergies  Current Outpatient Medications on File Prior to Visit  Medication Sig Dispense Refill   Accu-Chek FastClix Lancets MISC USE AS INSTRUCTED TO CHECK BLOOD SUGAR UP TO 3 TIMES  DAILY 306 each 1   amLODipine (NORVASC) 10 MG tablet TAKE ONE TABLET BY MOUTH ONCE A DAY FOR BLOOD PRESSURE 90 tablet 0   atorvastatin (LIPITOR) 40 MG tablet TAKE ONE TABLET BY MOUTH AT BEDTIME FOR CHOLESTEROL 90 tablet 3   Cholecalciferol (VITAMIN D3) 125 MCG (5000 UT) CAPS Take 5,000 Units by mouth daily.  DULoxetine (CYMBALTA) 30 MG capsule Take 1 capsule (30 mg total) by mouth daily. For pain, anxiety, depression. 90 capsule 0   glucose blood (ACCU-CHEK GUIDE) test strip USE UP TO FOUR TIMES DAILY TO CHECK BLOOD SUGAR (E10.9 E11.9) 200 each 5   Insulin Pen Needle (BD PEN NEEDLE MICRO U/F) 32G X 6 MM MISC USE TO INJECT INSULIN TWICE DAILY 100 each 0   loperamide (IMODIUM A-D) 2 MG tablet Take 2 mg by mouth 4 (four) times daily as needed for diarrhea or loose stools.     losartan (COZAAR) 100 MG tablet TAKE ONE TABLET BY MOUTH ONCE A DAY FOR BLOOD PRESSURE 90 tablet 0   Magnesium 200 MG TABS Take by mouth daily.     metFORMIN (GLUCOPHAGE-XR) 500 MG 24 hr tablet Take 4 tablets (2,000 mg total) by mouth daily with breakfast. for diabetes. 360 tablet 0   Multiple Vitamins-Minerals (CENTRUM SILVER ULTRA WOMENS PO) Take by mouth daily.     oxyCODONE (OXY IR/ROXICODONE) 5 MG immediate release tablet      pantoprazole (PROTONIX) 40 MG tablet      promethazine (PHENERGAN) 25 MG tablet Take 25 mg by mouth every 6 (six) hours as needed. Reported on 09/21/2015  1   propranolol ER (INDERAL LA) 80 MG 24 hr capsule TAKE ONE CAPSULE BY MOUTH ONCE DAILY FOR HEADACHE PREVENTION 90 capsule 0   No current facility-administered medications on file prior to visit.    BP 130/82   Pulse 75   Temp 98.4 F (36.9 C) (Temporal)   Ht 5\' 6"  (1.676 m)   Wt 277 lb (125.6 kg)   SpO2 98%   BMI 44.71 kg/m  Objective:   Physical Exam HENT:     Right Ear: Tympanic membrane and ear canal normal.     Left Ear: Tympanic membrane and ear canal normal.     Nose: Nose normal.  Eyes:     Conjunctiva/sclera:  Conjunctivae normal.     Pupils: Pupils are equal, round, and reactive to light.  Neck:     Thyroid: No thyromegaly.  Cardiovascular:     Rate and Rhythm: Normal rate and regular rhythm.     Heart sounds: No murmur heard. Pulmonary:     Effort: Pulmonary effort is normal.     Breath sounds: Normal breath sounds. No rales.  Abdominal:     General: Bowel sounds are normal.     Palpations: Abdomen is soft.     Tenderness: There is no abdominal tenderness.  Musculoskeletal:        General: Normal range of motion.     Cervical back: Neck supple.  Lymphadenopathy:     Cervical: No cervical adenopathy.  Skin:    General: Skin is warm and dry.     Findings: No rash.  Neurological:     Mental Status: She is alert and oriented to person, place, and time.     Cranial Nerves: No cranial nerve deficit.     Deep Tendon Reflexes: Reflexes are normal and symmetric.  Psychiatric:        Mood and Affect: Mood normal.           Assessment & Plan:  Preventative health care Assessment & Plan: Immunizations UTD. Mammogram and bone density scan due, orders placed. Colonoscopy UTD, due October 2024. Discussed with patient  Discussed the importance of a healthy diet and regular exercise in order for weight loss, and to reduce the risk of further co-morbidity.  Exam stable.  Labs pending.  Follow up in 1 year for repeat physical.    Stage 3a chronic kidney disease [facility]) Assessment & Plan: Following with nephrology, reviewed office notes from February 2024 through Care Everywhere.   Continue BP management and ARB.   Type 2 diabetes mellitus with hyperglycemia, with long-term current use of insulin (HCC) Assessment & Plan: Uncontrolled with A1C today of 8.2.  Agree to try Ozempic for weight loss and better diabetes control..  Stop Soliqua.  Start Ozempic 0.25 mg weekly x 4 weeks, then 0.5 mg weekly thereafter. Start Lantus 20 units daily.  Continue metformin XR 2000 mg  daily.  Close follow up in 3 months.  Orders: -     POCT glycosylated hemoglobin (Hb A1C) -     Lantus SoloStar; Inject 20 Units into the skin daily. for diabetes.  Dispense: 30 mL; Refill: 0 -     Ozempic (0.25 or 0.5 MG/DOSE); Inject 0.25 mg into the skin once weekly for 4 weeks, then increase to 0.5 mg once weekly thereafter for diabetes.  Dispense: 9 mL; Refill: 0  Frequent headaches Assessment & Plan: Continued.  She has yet to schedule her MRI.  She is also non compliant to her propranolol.  Discussed to start propranolol ER 80 mg for headache prevention.  She will call to schedule her MRI.    Essential hypertension Assessment & Plan: Controlled.  Continue losartan 100 mg daily, amlodipine 10 mg daily. Renal function reviewed from February 2024 per nephrology   Gastroesophageal reflux disease, unspecified whether esophagitis present Assessment & Plan: Controlled.  Continue pantoprazole 40 mg daily.   Hypothyroidism, unspecified type Assessment & Plan: Remain of treatment. Repeat TSH pending.  Orders: -     TSH -     T4, free  Other iron deficiency anemia Assessment & Plan: Following with hematology. Reviewed office notes from February 2024.   Continue oral iron daily.    Anxiety and depression Assessment & Plan: Inconsistent use of Cymbalta 30 mg. She plans on resuming.  Resume Cymbalta 30 mg daily.   Chronic pain syndrome Assessment & Plan: Following with pain management.   Continue oxycodone IR 5 mg PRN.   Screening mammogram for breast cancer -     3D Screening Mammogram, Left and Right; Future  Estrogen deficiency -     DG Bone Density; Future  Loud snoring Assessment & Plan: Reviewed pulmonology notes from May 2023. She never followed through on treatment.   Recommended she schedule a follow up visit for treatment.   Localized edema Assessment & Plan: Following with nephrology.  Continue torsemide 5 mg PRN.   Family  history of colon cancer in mother Assessment & Plan: Due for colonoscopy in October 2024.  Discussed with patient today.    Hyperlipidemia associated with type 2 diabetes mellitus (HCC) -     Lipid panel  Pelvic pressure in female Assessment & Plan: UA and culture ordered and pending.  Orders: -     Urine Culture -     Urinalysis, Routine w reflex microscopic  Hypercalcemia Assessment & Plan: Following with nephrology.   Hyperlipidemia LDL goal <100 Assessment & Plan: Repeat lipid panel pending. Continue atorvastatin 40 mg daily.          Doreene Nest, NP

## 2023-02-01 NOTE — Assessment & Plan Note (Signed)
Following with nephrology, reviewed office notes from February 2024 through Care Everywhere.   Continue BP management and ARB.

## 2023-02-01 NOTE — Assessment & Plan Note (Signed)
Controlled. ° °Continue pantoprazole 40 mg daily. °

## 2023-02-01 NOTE — Assessment & Plan Note (Signed)
Repeat lipid panel pending. Continue atorvastatin 40 mg daily. 

## 2023-02-01 NOTE — Assessment & Plan Note (Signed)
Continued.  She has yet to schedule her MRI.  She is also non compliant to her propranolol.  Discussed to start propranolol ER 80 mg for headache prevention.  She will call to schedule her MRI.

## 2023-02-01 NOTE — Assessment & Plan Note (Signed)
Following with nephrology.  Continue torsemide 5 mg PRN.

## 2023-02-01 NOTE — Assessment & Plan Note (Signed)
UA and culture ordered and pending.

## 2023-02-01 NOTE — Assessment & Plan Note (Signed)
Due for colonoscopy in October 2024.  Discussed with patient today.

## 2023-02-01 NOTE — Assessment & Plan Note (Signed)
Inconsistent use of Cymbalta 30 mg. She plans on resuming.  Resume Cymbalta 30 mg daily.

## 2023-02-01 NOTE — Patient Instructions (Signed)
Stop Soliqua for diabetes.  Start semaglutide (Ozempic) for diabetes/weight loss. Start by injecting 0.25 mg into the skin once weekly for 4 weeks, then increase to 0.5 mg once weekly thereafter. Please notify me once you've used your last 0.25 mg pen so that I can prescribe the next dose.   Start Lantus insulin. Inject 20 units into the skin once daily for diabetes.  Stop by the lab prior to leaving today. I will notify you of your results once received.   [facility] and bone density scan.   You are due for your colonoscopy in October 2024.  Please schedule a follow up visit for 3 months.  It was a pleasure to see you today!

## 2023-02-01 NOTE — Assessment & Plan Note (Signed)
Following with nephrology 

## 2023-02-01 NOTE — Assessment & Plan Note (Signed)
Uncontrolled with A1C today of 8.2.  Agree to try Ozempic for weight loss and better diabetes control..  Stop Soliqua.  Start Ozempic 0.25 mg weekly x 4 weeks, then 0.5 mg weekly thereafter. Start Lantus 20 units daily.  Continue metformin XR 2000 mg daily.  Close follow up in 3 months.

## 2023-02-02 ENCOUNTER — Telehealth: Payer: Self-pay | Admitting: Primary Care

## 2023-02-02 LAB — URINE CULTURE
MICRO NUMBER:: 14899148
SPECIMEN QUALITY:: ADEQUATE

## 2023-02-02 NOTE — Telephone Encounter (Signed)
Noted  

## 2023-02-02 NOTE — Telephone Encounter (Signed)
Patient called in returning a call she received. Relayed results message. Patient wanted to know if her urine culture came back fine or not. She also stated that she wasn't able to get her ozempic because it cost her $200. She is going to check with her insurance and see if she can get it through them. She will call back later to schedule follow up appointment. Thank you!

## 2023-02-02 NOTE — Telephone Encounter (Signed)
Let patient know that we will get in touch as soon as we have culture back.  She will get in touch with her insurance to see if it need auth or if there is one they cover better. She will send my chart message after she talks with insurance.

## 2023-02-06 ENCOUNTER — Telehealth: Payer: Self-pay

## 2023-02-06 NOTE — Progress Notes (Signed)
Care Management & Coordination Services Pharmacy Team  Reason for Encounter: Medication adherence  Spoke with patient on 02/06/2023    Patient is more than 5 days past due for refill on the following medications per chart history:  Drug Name / Strength / Sig Atorvastatin 40mg - take 1 tablet at bedtime   Patient is aware I am calling to review medications that show they are past due per insurance refill data.  Drug name Atorvastatin  is indicated for cholesterol.   How are you taking your atorvastatin?   Patient reports since last visit with PCP she will take 1 tablet at bedtime     How many tablets per day? 1   What time of day? Bedtime    What concerns do you have about this medication? Discussed importance of taking everyday as she took intermittently in the past    How often do you forget or accidentally miss a dose? Often in the past -due to patient preference   Do you use a pillbox? Yes   Are you having any problems getting this medication from your pharmacy? No  [place] [facility]    Has the cost of this medication been a concern? Not Atorvastatin.  Patient states she is unable to afford the  ozempic and lantus. Earnestine Leys recently made changes to diabetes medications after new labs 02/01/23- A1c 8.2,  Lantus  costs $98 and Ozempic $686. She continues to use the [place]- 25 units twice daily- patient states libre device shows BG's in green area on device.  When was the prescription last filled? Atorvastatin 40mg  -10/17/22 [facility]   Verified the patient does not have any expired medication (bottles > 6 year old) and asked them to dispose of any medications over 49 year old. Yes   Do you have excess medication on hand? Yes If yes please ask pt to explain  2 bottles on hand due to not compliant with taking.    Counseled patient on:  Importance of taking medication daily without missed doses, Benefits of adherence packaging or a pillbox, and Access to CCM  team for any cost, medication or pharmacy concerns.   Al Corpus, PharmD notified  Burt Knack, [facility] Clinical Pharmacy Assistant [phone]

## 2023-02-07 ENCOUNTER — Other Ambulatory Visit: Payer: Self-pay | Admitting: Primary Care

## 2023-02-07 DIAGNOSIS — Z794 Long term (current) use of insulin: Secondary | ICD-10-CM

## 2023-02-08 NOTE — Telephone Encounter (Signed)
60 units/day is the maximum dose. It is preferable to take it as 1 dose/day before breakfast due to how the GLP-1 RA component is absorbed.

## 2023-02-08 NOTE — Telephone Encounter (Signed)
Mardella Layman, can you advise on Soliquia dosing? What is the recommended max dose?  Is this just recommended once daily rather than twice daily?

## 2023-02-08 NOTE — Telephone Encounter (Signed)
Please call patient and notify her that she Soliqua max dose is 60 units daily. I spoke with our pharmacist who suggests she inject the 60 units in one dose, so once daily. She should get better use from the weight loss component of the medication if she injects once daily.  Have her keep a close eye on her sugars.   Needs diabetes follow up in 3 months. Please schedule.

## 2023-02-09 ENCOUNTER — Ambulatory Visit: Payer: Medicare HMO | Admitting: Pharmacist

## 2023-02-09 ENCOUNTER — Telehealth: Payer: Self-pay | Admitting: Pharmacist

## 2023-02-09 DIAGNOSIS — M5382 Other specified dorsopathies, cervical region: Secondary | ICD-10-CM | POA: Diagnosis not present

## 2023-02-09 DIAGNOSIS — M5412 Radiculopathy, cervical region: Secondary | ICD-10-CM | POA: Diagnosis not present

## 2023-02-09 NOTE — Telephone Encounter (Signed)
Care Management & Coordination Services Outreach Note  02/09/2023 Name: Danielle Edwards MRN: [id number] DOB: January 07, 1956  Referred by: Doreene Nest, NP  Patient had a phone appointment scheduled with clinical pharmacist today.  An unsuccessful telephone outreach was attempted today. The patient was referred to the pharmacist for assistance with medications, care management and care coordination.   Patient will NOT be penalized in any way for missing a Care Management & Coordination Services appointment. The no-show fee does not apply.  If possible, a message was left to return call to: [phone] or to [facility].  Al Corpus, PharmD, BCACP Clinical Pharmacist [facility] [phone]

## 2023-02-09 NOTE — Telephone Encounter (Signed)
Patient returned call. See care coordination encounter. 

## 2023-02-09 NOTE — Progress Notes (Signed)
Care Management & Coordination Services Pharmacy Note  02/09/2023 Name:  Danielle Edwards MRN:  [id number] DOB:  [birth year]  Summary: F/U visit -DM: A1c 8.2% (02/2023) worsened from 7.3% previously; she has been taking Soliqua 30 units BID, discussed optimal dosing, avoid splitting dose -HLD: LDL 117 (02/2023) worsened from 44 previously, pt was taking statin about once a week; she has now resumed taking statin daily due to lab results -Mood: pt started taking duloxetine 30 mg regularly and thinks she may need a higher dose; discussed adequate 4-week trial before increasing dose -Mild nausea: pt reports chronic, intermittent nausea for years; she takes promethazine PRN for this; of note she is not currently taking PPI daily   Recommendations/Changes made from today's visit: -Advised to change Soliqua to 60 units once daily before breakfast (as recommended by PCP); advised to contact PCP if hypoglycemia <70 occurs -Continue duloxetine 30 mg daily x 4 weeks for adequate trial; if still no improvement consider increasing to 60 mg then -Advised trial of pantoprazole 40 mg daily for 2 weeks, monitor for improvement in nausea  Follow up plan: -Pharmacist follow up televisit scheduled for 1 month -PCP F/U due ~05/04/23    Subjective: Danielle Edwards is an 67 y.o. year old female who is a primary patient of Doreene Nest, NP.  The care coordination team was consulted for assistance with disease management and care coordination needs.    Engaged with patient by telephone for follow up visit.  Recent office visits: 02/01/23 NP Mayra Reel OV: f/u - A1c 8.2%; she has gained 20 lbs since last visit and wants to try Ozempic. Stop Soliqua, start Lantus + Ozempic. Frequent headaches - advised to start propranolol ER 80 mg. Schedule MRI. Resume Cymbalta 30 mg. Ordered DEXA and mammogram. F/u 3 months.  06/28/22 NP Mayra Reel OV: f/u - A1c 7.3%; Rx propranolol 80 mg for headache. Order MRI brain (never  done)  Recent consult visits: 06/09/22 Dr Thedore Mins (Nephrology): printed info on SGLT2-I, pt will inquire ins. Coverage.  05/12/22 Dr Cathie Hoops (Oncology): f/u IDA. HGB improved 11.7. Rec IV venofer x 1 to improve iron stores.  Hospital visits: None in previous 6 months   Objective:  Lab Results  Component Value Date   CREATININE 1.14 (H) 11/11/2022   BUN 33 (H) 11/11/2022   GFR 41.66 (L) 11/25/2021   GFRNONAA 53 (L) 11/11/2022   NA 134 (L) 11/11/2022   K 4.2 11/11/2022   CALCIUM 10.0 11/11/2022   CALCIUM 10.7 (H) 11/11/2022   CO2 26 11/11/2022   GLUCOSE 133 (H) 11/11/2022    Lab Results  Component Value Date/Time   HGBA1C 8.2 (A) 02/01/2023 12:42 PM   HGBA1C 7.3 (A) 06/28/2022 11:17 AM   HGBA1C 9.4 (H) 11/25/2021 01:22 PM   HGBA1C 10.9 (H) 06/11/2020 03:14 PM   GFR 41.66 (L) 11/25/2021 01:22 PM   GFR 44.74 (L) 12/25/2020 11:25 AM   MICROALBUR 4.4 (H) 08/05/2016 11:14 AM   MICROALBUR 2.0 (H) 04/07/2015 09:15 AM    Last diabetic Eye exam:  Lab Results  Component Value Date/Time   HMDIABEYEEXA Retinopathy (A) 08/01/2022 12:00 AM    Last diabetic Foot exam: No results found for: "HMDIABFOOTEX"   Lab Results  Component Value Date   CHOL 198 02/01/2023   HDL 53.40 02/01/2023   LDLCALC 44 11/25/2021   LDLDIRECT 117.0 02/01/2023   TRIG 202.0 (H) 02/01/2023   CHOLHDL 4 02/01/2023       Latest Ref Rng & Units 11/11/2022  10:58 AM 02/09/2022   12:03 PM 11/25/2021    1:22 PM  Hepatic Function  Total Protein 6.5 - 8.1 g/dL  7.9  7.5   Albumin 3.5 - 5.0 g/dL 3.7  3.9  4.2   AST 15 - 41 U/L  18  14   ALT 0 - 44 U/L  19  15   Alk Phosphatase 38 - 126 U/L  78  69   Total Bilirubin 0.3 - 1.2 mg/dL  0.6  0.3     Lab Results  Component Value Date/Time   TSH 3.23 02/01/2023 12:57 PM   TSH 2.84 11/25/2021 01:22 PM   FREET4 0.92 02/01/2023 12:57 PM       Latest Ref Rng & Units 11/11/2022   10:58 AM 09/06/2022    1:02 PM 05/11/2022   11:21 AM  CBC  WBC 4.0 - 10.5 K/uL 8.0  9.1   8.7   Hemoglobin 12.0 - 15.0 g/dL 09.8  11.9  14.7   Hematocrit 36.0 - 46.0 % 37.5  39.9  36.3   Platelets 150 - 400 K/uL 268  237  241    Iron/TIBC/Ferritin/ %Sat    Component Value Date/Time   IRON 57 09/06/2022 1302   TIBC 393 09/06/2022 1302   FERRITIN 51 09/06/2022 1302   IRONPCTSAT 15 09/06/2022 1302     Lab Results  Component Value Date/Time   VD25OH 62.85 11/25/2021 01:22 PM   VD25OH 54.23 12/25/2020 11:25 AM   VITAMINB12 444 09/17/2019 03:23 PM   VITAMINB12 378 03/23/2015 11:50 AM    Clinical ASCVD: No  The 10-year ASCVD risk score (Arnett DK, et al., 2019) is: 17.6%   Values used to calculate the score:     Age: 50 years     Sex: Female     Is Non-Hispanic African American: No     Diabetic: Yes     Tobacco smoker: No     Systolic Blood Pressure: 130 mmHg     Is BP treated: Yes     HDL Cholesterol: 53.4 mg/dL     Total Cholesterol: 198 mg/dL       05/31/5620    3:08 PM 02/23/2021    1:32 PM 12/08/2020    1:27 PM  Depression screen PHQ 2/9  Decreased Interest 0 0 1  Down, Depressed, Hopeless 1 0 0  PHQ - 2 Score 1 0 1  Altered sleeping  0   Tired, decreased energy  0   Change in appetite  0   Feeling bad or failure about yourself   0   Trouble concentrating  0   Moving slowly or fidgety/restless  0   Suicidal thoughts  0   PHQ-9 Score  0   Difficult doing work/chores  Not difficult at all      Social History   Tobacco Use  Smoking Status Never  Smokeless Tobacco Never   BP Readings from Last 3 Encounters:  02/01/23 130/82  10/26/22 (!) 150/69  06/28/22 128/64   Pulse Readings from Last 3 Encounters:  02/01/23 75  10/26/22 75  06/28/22 74   Wt Readings from Last 3 Encounters:  02/01/23 277 lb (125.6 kg)  10/26/22 275 lb 8 oz (125 kg)  06/28/22 256 lb (116.1 kg)   BMI Readings from Last 3 Encounters:  02/01/23 44.71 kg/m  10/26/22 44.47 kg/m  06/28/22 41.32 kg/m    No Known Allergies  Medications Reviewed Today     Reviewed  by Kathyrn Sheriff, RPH (  Pharmacist) on 02/09/23 at 1626  Med List Status: <None>   Medication Order Taking? Sig Documenting Provider Last Dose Status Informant  Accu-Chek FastClix Lancets MISC [id number] Yes USE AS INSTRUCTED TO CHECK BLOOD SUGAR UP TO 3 TIMES DAILY Doreene Nest, NP Taking Active   amLODipine (NORVASC) 10 MG tablet [id number] Yes TAKE ONE TABLET BY MOUTH ONCE A DAY FOR BLOOD PRESSURE Doreene Nest, NP Taking Active   atorvastatin (LIPITOR) 40 MG tablet [id number] Yes TAKE ONE TABLET BY MOUTH AT BEDTIME FOR CHOLESTEROL Doreene Nest, NP Taking Active   Cholecalciferol (VITAMIN D3) 125 MCG (5000 UT) CAPS [id number] Yes Take 5,000 Units by mouth daily. [provider] Taking Active   DULoxetine (CYMBALTA) 30 MG capsule [id number] Yes Take 1 capsule (30 mg total) by mouth daily. For pain, anxiety, depression. Doreene Nest, NP Taking Active   glucose blood (ACCU-CHEK GUIDE) test strip [id number] Yes USE UP TO FOUR TIMES DAILY TO CHECK BLOOD SUGAR (E10.9 E11.9) Doreene Nest, NP Taking Active   Insulin Glargine-Lixisenatide [facility]) 100-33 UNT-MCG/ML SOPN [id number] Yes Inject 60 Units into the skin daily. for diabetes. Doreene Nest, NP Taking Active   Insulin Pen Needle (BD PEN NEEDLE MICRO U/F) 32G X 6 MM MISC [id number] Yes USE TO INJECT INSULIN TWICE DAILY Doreene Nest, NP Taking Active   loperamide (IMODIUM A-D) 2 MG tablet [id number] Yes Take 2 mg by mouth 4 (four) times daily as needed for diarrhea or loose stools. [provider] Taking Active   losartan (COZAAR) 100 MG tablet [id number] Yes TAKE ONE TABLET BY MOUTH ONCE A DAY FOR BLOOD PRESSURE Doreene Nest, NP Taking Active   Magnesium 200 MG TABS [id number] Yes Take by mouth daily. [provider] Taking Active   metFORMIN (GLUCOPHAGE-XR) 500 MG 24 hr tablet [id number] Yes Take 4 tablets (2,000 mg total) by mouth daily with breakfast. for diabetes. Doreene Nest, NP Taking Active   Multiple Vitamins-Minerals (CENTRUM SILVER ULTRA WOMENS PO) [id number] Yes Take by mouth daily. [provider] Taking Active   oxyCODONE (OXY IR/ROXICODONE) 5 MG immediate release tablet [id number] Yes 1-2 tablets every 4-6 hr as needed; #225 per month [provider] Taking Active   pantoprazole (PROTONIX) 40 MG tablet [id number] Yes Take 40 mg by mouth daily as needed. [provider] Taking Active   promethazine (PHENERGAN) 25 MG tablet [id number] Yes Take 25 mg by mouth every 6 (six) hours as needed. Reported on 09/21/2015 [provider] Taking Active            Med Note Laretta Bolster, [facility]   Fri Aug 05, 2016 10:19 AM)    propranolol ER (INDERAL LA) 80 MG 24 hr capsule [id number] Yes TAKE ONE CAPSULE BY MOUTH ONCE DAILY FOR HEADACHE PREVENTION Doreene Nest, NP Taking Active             SDOH:  (Social Determinants of Health) assessments and interventions performed: No SDOH Interventions    Flowsheet Row Clinical Support from 02/24/2022 in [facility] Clinical Support from 02/23/2021 in [facility] Chronic Care Management from 12/08/2020 in [facility] Clinical Support from 09/12/2019 in [facility] [place]  SDOH Interventions      Food Insecurity Interventions Intervention Not Indicated -- -- --  Housing Interventions Intervention Not Indicated -- -- --  Transportation Interventions Intervention Not Indicated -- -- --  Depression Interventions/Treatment  -- ZOX0-9 Score <4 Follow-up Not Indicated -- PHQ2-9 Score <4 Follow-up Not Indicated  Financial Strain Interventions Intervention Not Indicated -- Intervention Not Indicated  [Medications affordable] --  Physical Activity Interventions Intervention Not Indicated -- -- --  Stress Interventions Intervention Not Indicated -- -- --  Social Connections  Interventions Intervention Not Indicated -- -- --      SDOH Screenings   Food Insecurity: No Food Insecurity (01/31/2023)  Housing: Low Risk  (01/31/2023)  Transportation Needs: No Transportation Needs (01/31/2023)  Alcohol Screen: Low Risk  (02/24/2022)  Depression (PHQ2-9): Low Risk  (02/24/2022)  Financial Resource Strain: Low Risk  (01/31/2023)  Physical Activity: Inactive (01/31/2023)  Social Connections: Socially Integrated (01/31/2023)  Stress: Stress Concern Present (01/31/2023)  Tobacco Use: Low Risk  (02/01/2023)    Medication Assistance: None required.  Patient affirms current coverage meets needs.  Medication Access: Within the past 30 days, how often has patient missed a dose of medication? 0 Is a pillbox or other method used to improve adherence? No  Factors that may affect medication adherence? lack of understanding of disease management Are meds synced by current pharmacy? No  Are meds delivered by current pharmacy? No  Does patient experience delays in picking up medications due to transportation concerns? No   [facility] Services Reviewed: Is patient disadvantaged to use [facility]?: No  Current Rx insurance plan: [employer] Name and location of Current pharmacy:  [facility] - [place], [place] - [address] [place] [address] Phone: [phone] Fax: [phone]  [facility] services reviewed with patient today?: No  Patient requests to transfer care to [facility]?: No  Reason patient declined to change pharmacies: Loyalty to other pharmacy/Patient preference  Compliance/Adherence/Medication fill history: Care Gaps: Foot exam (due 05/2022) UACR (due 08/2017) - done 06/2022 @ [facility]  Star-Rating Drugs: Atorvastatin - PDC 81%; LF 10/17/22 x 90 ds --pt stopped atorvastatin summer 2023 but restarted and has been adherent since Losartan - PDC 100% Metformin - PDC 100%   Assessment/Plan  Hypertension /  CKD Stage 3b (BP goal <140/90) -Controlled - controlled per clinic readings, no updates today, stable  -Current treatment: Losartan 100 mg  daily - Appropriate, Effective, Safe, Accessible Amlodipine 40 mg daily - Appropriate, Effective, Safe, Accessible -Medications previously tried: none reported -Current home readings: none reported, not routinely monitoring  -Denies hypotensive/hypertensive symptoms -Recommended to continue current medication  Hyperlipidemia: (LDL goal < 70) -Not ideally controlled - LDL 117 (02/2023) significantly worsened from 44 (11/2021) after changing atorvastatin to once a week; she has resumed statin daily due to lab results -Current treatment: Atorvastatin 40 mg daily AM - Appropriate, Effective, Safe, Accessible -Medications previously tried: none reported -Reviewed importance of daily statin adherence -Recommended to continue current medication  Diabetes (A1c goal <7%) -Not ideally controlled- A1c 8.2% (02/2023), worsened from 7.3% -Current home glucose readings: Freestyle Libre 2 (app) -Current medications: Soliqua 60 units daily - Appropriate, Query Effective Metformin XR 500 mg - 4 tab daily - Appropriate, Effective, Safe, Accessible Freestyle Libre 2 -Medications previously tried: glimepiride, Jardiance (vaginal discomfort/yeast infections), Januvia (cost), Trulicity (cost) -Educated on A1c and blood sugar goals; Prevention and management of hypoglycemic episodes; Continuous glucose monitoring; -Recommended to continue current medication  Frequent headaches -Controlled - pt has started propranolol daily as of last week, she was not aware it is for prevention of headaches -Current treatment  Propranolol ER 80 mg daily - Appropriate, Effective, Safe, Accessible -Medications previously tried: n/a  -  Discussed role of propranolol for headache prevention, not to be used as treatment -Recommended to continue current medication  Depression / Anxiety (Goal:  manage symptoms) -Pt just recently started duloxetine back after being off for a while; she thinks she may need a higher dose -pt reports hx of ADD, she was on Straterra in the past -Current treatment  Duloxetine 30 mg daily - Appropriate, Effective, Safe, Accessible -Medications previously tried: paroxetine -Discussed adequate trial of duloxetine is 4-6 weeks, may consider increasing dose after that -Recommended to continue current medication  Health Maintenance -Vaccine gaps: None -DEXA scan 06/2015 - normal -Nausea: taking promethazine PRN; pt reports nausea is chronic/intermittent; she is not taking PPI daily -suggested 2 week trial of daily PPI to see if it helps with nausea   Al Corpus, PharmD, P[place]y [place], CPP Clinical Pharmacist Practitioner [facility] [phone]

## 2023-02-28 ENCOUNTER — Other Ambulatory Visit: Payer: Self-pay | Admitting: Primary Care

## 2023-02-28 ENCOUNTER — Telehealth: Payer: Self-pay | Admitting: Primary Care

## 2023-02-28 DIAGNOSIS — E1165 Type 2 diabetes mellitus with hyperglycemia: Secondary | ICD-10-CM

## 2023-02-28 NOTE — Telephone Encounter (Signed)
Prescription Request  02/28/2023  LOV: 02/01/2023  What is the name of the medication or equipment? metFORMIN (GLUCOPHAGE-XR) 500 MG 24 hr tablet   Have you contacted your pharmacy to request a refill? Yes   Which pharmacy would you like this sent to?  [facility] - [place], [place] - [address] [place] [address] Phone: [phone] Fax: [phone]    Patient notified that their request is being sent to the clinical staff for review and that they should receive a response within 2 business days.   Please advise at Mobile [phone] (mobile)  Pt stated she is all out of her medication

## 2023-02-28 NOTE — Telephone Encounter (Signed)
See refill request for further documentation.

## 2023-03-07 ENCOUNTER — Telehealth: Payer: Self-pay

## 2023-03-07 NOTE — Progress Notes (Signed)
Care Management & Coordination Services Pharmacy Team  Reason for Encounter: Appointment Reminder  Contacted patient to confirm telephone appointment with Al Corpus , PharmD on 03/10/23 at 1:30. Unsuccessful outreach. Left voicemail for patient to return call.  Have you seen any other providers since your last visit with PCP?  Yes  Pain medicine- cervical radiculopathy    Hospital visits:  None in previous 6 months   Star Rating Drugs:  Medication:  Last Fill: Day Supply Atorvastatin 40mg  10/17/22 90 Soliqua  02/20/23 25 Losartan 100mg  01/31/23 90 Metformin xr 500mg  03/01/23 90   Care Gaps: Annual wellness visit in last year? Yes  If Diabetic: Last eye exam / retinopathy screening:UTD Last diabetic foot exam:2022   Al Corpus, PharmD notified  Burt Knack, [facility] Clinical Pharmacy Assistant [phone]

## 2023-03-08 ENCOUNTER — Ambulatory Visit (INDEPENDENT_AMBULATORY_CARE_PROVIDER_SITE_OTHER): Payer: Medicare HMO

## 2023-03-08 VITALS — Ht 66.0 in | Wt 270.0 lb

## 2023-03-08 DIAGNOSIS — Z0000 Encounter for general adult medical examination without abnormal findings: Secondary | ICD-10-CM | POA: Diagnosis not present

## 2023-03-08 DIAGNOSIS — Z1211 Encounter for screening for malignant neoplasm of colon: Secondary | ICD-10-CM

## 2023-03-08 NOTE — Progress Notes (Signed)
I connected with  Rushia Bertz on 03/08/23 by a audio enabled telemedicine application and verified that I am speaking with the correct person using two identifiers.  Patient Location: Home  Provider Location: Home Office  I discussed the limitations of evaluation and management by telemedicine. The patient expressed understanding and agreed to proceed.  Subjective:   Danielle Edwards is a 67 y.o. female who presents for Medicare Annual (Subsequent) preventive examination.  Review of Systems      Cardiac Risk Factors include: advanced age (>53men, >8 women);hypertension;diabetes mellitus;sedentary lifestyle     Objective:    Today's Vitals   03/08/23 1051 03/08/23 1052  Weight: 270 lb (122.5 kg)   Height: 5\' 6"  (1.676 m)   PainSc:  4    Body mass index is 43.58 kg/m.     03/08/2023   11:06 AM 05/12/2022    1:20 PM 02/24/2022    2:20 PM 02/08/2022    4:13 PM 02/23/2021    1:28 PM 07/17/2020    1:08 PM 09/12/2019   10:36 AM  Advanced Directives  Does Patient Have a Medical Advance Directive? No No No No No No No  Does patient want to make changes to medical advance directive?     No - Patient declined    Would patient like information on creating a medical advance directive? No - Patient declined  No - Patient declined   No - Patient declined No - Patient declined    Current Medications (verified) Outpatient Encounter Medications as of 03/08/2023  Medication Sig   Accu-Chek FastClix Lancets MISC USE AS INSTRUCTED TO CHECK BLOOD SUGAR UP TO 3 TIMES DAILY   amLODipine (NORVASC) 10 MG tablet TAKE ONE TABLET BY MOUTH ONCE A DAY FOR BLOOD PRESSURE   atorvastatin (LIPITOR) 40 MG tablet TAKE ONE TABLET BY MOUTH AT BEDTIME FOR CHOLESTEROL   Cholecalciferol (VITAMIN D3) 125 MCG (5000 UT) CAPS Take 5,000 Units by mouth daily.   DULoxetine (CYMBALTA) 30 MG capsule Take 1 capsule (30 mg total) by mouth daily. For pain, anxiety, depression.   glucose blood (ACCU-CHEK GUIDE) test strip USE  UP TO FOUR TIMES DAILY TO CHECK BLOOD SUGAR (E10.9 E11.9)   Insulin Glargine-Lixisenatide (SOLIQUA) 100-33 UNT-MCG/ML SOPN Inject 60 Units into the skin daily. for diabetes.   Insulin Pen Needle (BD PEN NEEDLE MICRO U/F) 32G X 6 MM MISC USE TO INJECT INSULIN TWICE DAILY   IRON, FERROUS SULFATE, PO Take by mouth.   loperamide (IMODIUM A-D) 2 MG tablet Take 2 mg by mouth 4 (four) times daily as needed for diarrhea or loose stools.   losartan (COZAAR) 100 MG tablet TAKE ONE TABLET BY MOUTH ONCE A DAY FOR BLOOD PRESSURE   Magnesium 200 MG TABS Take by mouth daily.   metFORMIN (GLUCOPHAGE-XR) 500 MG 24 hr tablet Take 4 tablets (2,000 mg total) by mouth daily with breakfast. for diabetes.   Multiple Vitamins-Minerals (CENTRUM SILVER ULTRA WOMENS PO) Take by mouth daily.   oxyCODONE (OXY IR/ROXICODONE) 5 MG immediate release tablet 1-2 tablets every 4-6 hr as needed; #225 per month   pantoprazole (PROTONIX) 40 MG tablet Take 40 mg by mouth daily as needed.   promethazine (PHENERGAN) 25 MG tablet Take 25 mg by mouth every 6 (six) hours as needed. Reported on 09/21/2015   propranolol ER (INDERAL LA) 80 MG 24 hr capsule TAKE ONE CAPSULE BY MOUTH ONCE DAILY FOR HEADACHE PREVENTION   No facility-administered encounter medications on file as of 03/08/2023.    Allergies (  verified) Patient has no known allergies.   History: Past Medical History:  Diagnosis Date   Allergy    seasonal allergies   Anemia    hx of IDA   Anxiety    on meds   Arthritis    PRN meds-osteoarthritis   Cataract    sx to remove   Chronic kidney disease    CKD-stage 3   Chronic pain    COVID-19 06/02/2020   Depression    on meds   Diabetes type 2, controlled (HCC)    on meds   Fibromyalgia    GERD (gastroesophageal reflux disease)    on meds   Headache    History of colonic polyps 1999   Benign   Hx of adenomatous polyp of colon 09/27/2000   2001 - diminutive adenoma [place] 2007 no polyps - [place]    Hyperlipidemia    on meds   Hypertension    on meds   Hypothyroidism    not on meds at this time   IBS (irritable bowel syndrome)    Diarrhea type   IDA (iron deficiency anemia) 02/09/2022   Neuromuscular disorder (HCC)    Vitamin D deficiency    Past Surgical History:  Procedure Laterality Date   CHOLECYSTECTOMY  1995   COLONOSCOPY  multiple   COLONOSCOPY  2016   TA's   NASAL SINUS SURGERY  2001   POLYPECTOMY     TA's   TONSILLECTOMY AND ADENOIDECTOMY     VAGINAL HYSTERECTOMY     Complete   WISDOM TOOTH EXTRACTION     Family History  Problem Relation Age of Onset   Colon cancer Mother 38   Lung cancer Mother 29   Skin cancer Mother    Colon polyps Mother 51   Heart disease Father    Diabetes Father    Skin cancer Father    Colon polyps Brother 32   Stomach cancer Paternal Grandmother 53   Breast cancer Neg Hx    Esophageal cancer Neg Hx    Rectal cancer Neg Hx    Social History   Socioeconomic History   Marital status: Married    Spouse name: Not on file   Number of children: Not on file   Years of education: Not on file   Highest education level: 12th grade  Occupational History   Not on file  Tobacco Use   Smoking status: Never   Smokeless tobacco: Never  Vaping Use   Vaping Use: Never used  Substance and Sexual Activity   Alcohol use: Yes    Alcohol/week: 0.0 standard drinks of alcohol    Comment: rarely   Drug use: No   Sexual activity: Not on file  Other Topics Concern   Not on file  Social History Narrative   Married.   3 children.   Retired, worked at [employer] in [place], [place].   Enjoys being with her grandchildren, being with her friends.   Social Determinants of Health   Financial Resource Strain: Low Risk  (03/08/2023)   Overall Financial Resource Strain (CARDIA)    Difficulty of Paying Living Expenses: Not hard at all  Food Insecurity: No Food Insecurity (03/08/2023)   Hunger Vital Sign    Worried About Running Out of Food in the Last  Year: Never true    Ran Out of Food in the Last Year: Never true  Transportation Needs: No Transportation Needs (03/08/2023)   PRAPARE - Administrator, Civil Service (Medical): No  Lack of Transportation (Non-Medical): No  Physical Activity: Inactive (03/08/2023)   Exercise Vital Sign    Days of Exercise per Week: 0 days    Minutes of Exercise per Session: 0 min  Stress: No Stress Concern Present (03/08/2023)   [employer] of Occupational Health - Occupational Stress Questionnaire    Feeling of Stress : Not at all  Recent Concern: Stress - Stress Concern Present (01/31/2023)   [employer] of Occupational Health - Occupational Stress Questionnaire    Feeling of Stress : To some extent  Social Connections: Socially Integrated (03/08/2023)   Social Connection and Isolation Panel [NHANES]    Frequency of Communication with Friends and Family: More than three times a week    Frequency of Social Gatherings with Friends and Family: More than three times a week    Attends Religious Services: More than 4 times per year    Active Member of [employer] or Organizations: Yes    Attends Engineer, structural: More than 4 times per year    Marital Status: Married    Tobacco Counseling Counseling given: Not Answered   Clinical Intake:  Pre-visit preparation completed: Yes  Pain : 0-10 Pain Score: 4  Pain Type: Chronic pain Pain Location: Generalized Pain Descriptors / Indicators: Aching     Nutritional Risks: None Diabetes: Yes CBG done?: Yes (78 per pt) CBG resulted in Enter/ Edit results?: No Did pt. bring in CBG monitor from home?: No  How often do you need to have someone help you when you read instructions, pamphlets, or other written materials from your doctor or pharmacy?: 1 - Never  Diabetic?Nutrition Risk Assessment:  Has the patient had any N/V/D within the last 2 months?  No  Does the patient have any non-healing wounds?  No  Has the patient had  any unintentional weight loss or weight gain?  No   Diabetes:  Is the patient diabetic?  Yes  If diabetic, was a CBG obtained today?  Yes 78 per pt Did the patient bring in their glucometer from home?  No  How often do you monitor your CBG's? Continuous monitor.   Financial Strains and Diabetes Management:  Are you having any financial strains with the device, your supplies or your medication? No .  Does the patient want to be seen by Chronic Care Management for management of their diabetes?  No  Would the patient like to be referred to a Nutritionist or for Diabetic Management?  No   Diabetic Exams:  Diabetic Eye Exam: Completed 08/01/22 Dr.Groat Diabetic Foot Exam: Completed 05/26/21 PCP    Interpreter Needed?: No  Information entered by :: C.Eiza Canniff LPN   Activities of Daily Living    03/08/2023   11:08 AM  In your present state of health, do you have any difficulty performing the following activities:  Hearing? 0  Vision? 0  Difficulty concentrating or making decisions? 0  Walking or climbing stairs? 0  Dressing or bathing? 0  Doing errands, shopping? 0  Preparing Food and eating ? N  Using the Toilet? N  In the past six months, have you accidently leaked urine? Y  Comment occasionally when bladder is full  Do you have problems with loss of bowel control? N  Managing your Medications? N  Managing your Finances? N  Housekeeping or managing your Housekeeping? N    Patient Care Team: Doreene Nest, NP as PCP - General (Nurse Practitioner) Kathyrn Sheriff, [facility] as Pharmacist (Pharmacist)  Indicate any  recent Medical Services you may have received from other than [facility] providers in the past year (date may be approximate).     Assessment:   This is a routine wellness examination for Danielle Edwards.  Hearing/Vision screen Hearing Screening - Comments:: No hearing issues Vision Screening - Comments:: Readers - Dr.Groat  Dietary issues and exercise activities  discussed: Current Exercise Habits: The patient does not participate in regular exercise at present, Exercise limited by: None identified   Goals Addressed             This Visit's Progress    Patient Stated       Lose weight, eat health.       Depression Screen    03/08/2023   11:06 AM 02/24/2022    2:17 PM 02/23/2021    1:32 PM 12/08/2020    1:27 PM 09/12/2019   10:39 AM  PHQ 2/9 Scores  PHQ - 2 Score 0 1 0 1 0  PHQ- 9 Score   0  0    Fall Risk    03/08/2023   11:07 AM 02/01/2023   12:20 PM 02/24/2022    2:21 PM 02/23/2021    1:30 PM 12/25/2020   10:50 AM  Fall Risk   Falls in the past year? 0 0 0 0 0  Number falls in past yr: 0 0 0 0 0  Injury with Fall? 0 0 0 0 0  Risk for fall due to : No Fall Risks No Fall Risks No Fall Risks Medication side effect   Follow up Falls prevention discussed;Falls evaluation completed Falls evaluation completed Falls evaluation completed Falls evaluation completed;Falls prevention discussed     FALL RISK PREVENTION PERTAINING TO THE HOME:  Any stairs in or around the home? Yes  If so, are there any without handrails? No  Home free of loose throw rugs in walkways, pet beds, electrical cords, etc? Yes  Adequate lighting in your home to reduce risk of falls? Yes   ASSISTIVE DEVICES UTILIZED TO PREVENT FALLS:  Life alert? No  Use of a cane, walker or w/c? No  Grab bars in the bathroom? No  Shower chair or bench in shower? No  Elevated toilet seat or a handicapped toilet? No    Cognitive Function:    02/23/2021    1:35 PM 09/12/2019   10:41 AM  MMSE - Mini Mental State Exam  Not completed: Refused   Orientation to time  5  Orientation to Place  5  Registration  3  Attention/ Calculation  5  Recall  3  Language- repeat  1        03/08/2023   11:09 AM  6CIT Screen  What Year? 0 points  What month? 0 points  What time? 0 points  Count back from 20 0 points  Months in reverse 0 points  Repeat phrase 0 points  Total Score 0  points    Immunizations Immunization History  Administered Date(s) Administered   DTaP 12/01/2013   Fluad Quad(high Dose 65+) 07/21/2020, 07/16/2021, 06/28/2022   Influenza Inj Mdck Quad Pf 11/03/2017   Influenza,inj,Quad PF,6+ Mos 06/29/2015, 05/16/2018, 09/11/2019   Influenza-Unspecified 09/11/2019   Janssen (J&J) SARS-COV-2 Vaccination 12/11/2019   PFIZER(Purple Top)SARS-COV-2 Vaccination 09/19/2020   PNEUMOCOCCAL CONJUGATE-20 05/26/2021   Pneumococcal Polysaccharide-23 04/13/2015   Zoster Recombinat (Shingrix) 05/21/2018, 09/12/2018    TDAP status: Up to date  Flu Vaccine status: Up to date  Pneumococcal vaccine status: Up to date  Covid-19 vaccine status: Information provided  on how to obtain vaccines.   Qualifies for Shingles Vaccine? Yes   Zostavax completed No   Shingrix Completed?: Yes  Screening Tests Health Maintenance  Topic Date Due   FOOT EXAM  05/26/2022   INFLUENZA VACCINE  05/04/2023   Colonoscopy  07/18/2023   OPHTHALMOLOGY EXAM  08/02/2023   HEMOGLOBIN A1C  08/04/2023   Diabetic kidney evaluation - eGFR measurement  11/12/2023   Diabetic kidney evaluation - Urine ACR  11/12/2023   DTaP/Tdap/Td (2 - Tdap) 12/02/2023   MAMMOGRAM  02/03/2024   Medicare Annual Wellness (AWV)  03/07/2024   Pneumonia Vaccine 96+ Years old  Completed   DEXA SCAN  Completed   Hepatitis C Screening  Completed   Zoster Vaccines- Shingrix  Completed   HPV VACCINES  Aged Out   COVID-19 Vaccine  Discontinued    Health Maintenance  Health Maintenance Due  Topic Date Due   FOOT EXAM  05/26/2022    Colorectal cancer screening: Type of screening: Colonoscopy. Completed 07/17/20. Repeat every 3 years  Mammogram status: Ordered 02/01/2023. Pt provided with contact info and advised to call to schedule appt.   Bone Density status: Ordered 02/01/2023. Pt provided with contact info and advised to call to schedule appt.  Lung Cancer Screening: (Low Dose CT Chest recommended  if Age 38-80 years, 30 pack-year currently smoking OR have quit w/in 15years.) does not qualify.   Lung Cancer Screening Referral: no  Additional Screening:  Hepatitis C Screening: does qualify; Completed 08/05/16  Vision Screening: Recommended annual ophthalmology exams for early detection of glaucoma and other disorders of the eye. Is the patient up to date with their annual eye exam?  Yes  Who is the provider or what is the name of the office in which the patient attends annual eye exams? Dr.Groat If pt is not established with a provider, would they like to be referred to a provider to establish care? Yes .   Dental Screening: Recommended annual dental exams for proper oral hygiene  Community Resource Referral / Chronic Care Management: CRR required this visit?  No   CCM required this visit?  No      Plan:     I have personally reviewed and noted the following in the patient's chart:   Medical and social history Use of alcohol, tobacco or illicit drugs  Current medications and supplements including opioid prescriptions. Patient is currently taking opioid prescriptions. Information provided to patient regarding non-opioid alternatives. Patient advised to discuss non-opioid treatment plan with their provider. Functional ability and status Nutritional status Physical activity Advanced directives List of other physicians Hospitalizations, surgeries, and ER visits in previous 12 months Vitals Screenings to include cognitive, depression, and falls Referrals and appointments  In addition, I have reviewed and discussed with patient certain preventive protocols, quality metrics, and best practice recommendations. A written personalized care plan for preventive services as well as general preventive health recommendations were provided to patient.     Maryan Puls, LPN   10/08/1094   Nurse Notes: none

## 2023-03-08 NOTE — Patient Instructions (Signed)
Danielle Edwards , Thank you for taking time to come for your Medicare Wellness Visit. I appreciate your ongoing commitment to your health goals. Please review the following plan we discussed and let me know if I can assist you in the future.   These are the goals we discussed:  Goals      DIET - EAT MORE FRUITS AND VEGETABLES     Patient Stated     09/12/2019, I will try to eat a healthier diet and decrease my carb intake.      Patient Stated     02/23/2021, I will maintain and continue medications as prescribed.      Patient Stated     Lose weight, eat health.        This is a list of the screening recommended for you and due dates:  Health Maintenance  Topic Date Due   Complete foot exam   05/26/2022   Flu Shot  05/04/2023   Colon Cancer Screening  07/18/2023   Eye exam for diabetics  08/02/2023   Hemoglobin A1C  08/04/2023   Yearly kidney function blood test for diabetes  11/12/2023   Yearly kidney health urinalysis for diabetes  11/12/2023   DTaP/Tdap/Td vaccine (2 - Tdap) 12/02/2023   Mammogram  02/03/2024   Medicare Annual Wellness Visit  03/07/2024   Pneumonia Vaccine  Completed   DEXA scan (bone density measurement)  Completed   Hepatitis C Screening  Completed   Zoster (Shingles) Vaccine  Completed   HPV Vaccine  Aged Out   COVID-19 Vaccine  Discontinued   Managing Pain Without Opioids Opioids are strong medicines used to treat moderate to severe pain. For some people, especially those who have long-term (chronic) pain, opioids may not be the best choice for pain management due to: Side effects like nausea, constipation, and sleepiness. The risk of addiction (opioid use disorder). The longer you take opioids, the greater your risk of addiction. Pain that lasts for more than 3 months is called chronic pain. Managing chronic pain usually requires more than one approach and is often provided by a team of health care providers working together (multidisciplinary approach).  Pain management may be done at a pain management center or pain clinic. How to manage pain without the use of opioids Use non-opioid medicines Non-opioid medicines for pain may include: Over-the-counter or prescription non-steroidal anti-inflammatory drugs (NSAIDs). These may be the first medicines used for pain. They work well for muscle and bone pain, and they reduce swelling. Acetaminophen. This over-the-counter medicine may work well for milder pain but not swelling. Antidepressants. These may be used to treat chronic pain. A certain type of antidepressant (tricyclics) is often used. These medicines are given in lower doses for pain than when used for depression. Anticonvulsants. These are usually used to treat seizures but may also reduce nerve (neuropathic) pain. Muscle relaxants. These relieve pain caused by sudden muscle tightening (spasms). You may also use a pain medicine that is applied to the skin as a patch, cream, or gel (topical analgesic), such as a numbing medicine. These may cause fewer side effects than medicines taken by mouth. Do certain therapies as directed Some therapies can help with pain management. They include: Physical therapy. You will do exercises to gain strength and flexibility. A physical therapist may teach you exercises to move and stretch parts of your body that are weak, stiff, or painful. You can learn these exercises at physical therapy visits and practice them at home. Physical therapy  may also involve: Massage. Heat wraps or applying heat or cold to affected areas. Electrical signals that interrupt pain signals (transcutaneous electrical nerve stimulation, TENS). Weak lasers that reduce pain and swelling (low-level laser therapy). Signals from your body that help you learn to regulate pain (biofeedback). Occupational therapy. This helps you to learn ways to function at home and work with less pain. Recreational therapy. This involves trying new activities  or hobbies, such as a physical activity or drawing. Mental health therapy, including: Cognitive behavioral therapy (CBT). This helps you learn coping skills for dealing with pain. Acceptance and commitment therapy (ACT) to change the way you think and react to pain. Relaxation therapies, including muscle relaxation exercises and mindfulness-based stress reduction. Pain management counseling. This may be individual, family, or group counseling.  Receive medical treatments Medical treatments for pain management include: Nerve block injections. These may include a pain blocker and anti-inflammatory medicines. You may have injections: Near the spine to relieve chronic back or neck pain. Into joints to relieve back or joint pain. Into nerve areas that supply a painful area to relieve body pain. Into muscles (trigger point injections) to relieve some painful muscle conditions. A medical device placed near your spine to help block pain signals and relieve nerve pain or chronic back pain (spinal cord stimulation device). Acupuncture. Follow these instructions at home Medicines Take over-the-counter and prescription medicines only as told by your health care provider. If you are taking pain medicine, ask your health care providers about possible side effects to watch out for. Do not drive or use heavy machinery while taking prescription opioid pain medicine. Lifestyle  Do not use drugs or alcohol to reduce pain. If you drink alcohol, limit how much you have to: 0-1 drink a day for women who are not pregnant. 0-2 drinks a day for men. Know how much alcohol is in a drink. In the [place] one drink equals one 12 oz bottle of beer (355 mL), one 5 oz glass of wine (148 mL), or one 1 oz glass of hard liquor (44 mL). Do not use any products that contain nicotine or tobacco. These products include cigarettes, chewing tobacco, and vaping devices, such as e-cigarettes. If you need help quitting, ask your  health care provider. Eat a healthy diet and maintain a healthy weight. Poor diet and excess weight may make pain worse. Eat foods that are high in fiber. These include fresh fruits and vegetables, whole grains, and beans. Limit foods that are high in fat and processed sugars, such as fried and sweet foods. Exercise regularly. Exercise lowers stress and may help relieve pain. Ask your health care provider what activities and exercises are safe for you. If your health care provider approves, join an exercise class that combines movement and stress reduction. Examples include yoga and tai chi. Get enough sleep. Lack of sleep may make pain worse. Lower stress as much as possible. Practice stress reduction techniques as told by your therapist. General instructions Work with all your pain management providers to find the treatments that work best for you. You are an important member of your pain management team. There are many things you can do to reduce pain on your own. Consider joining an online or in-person support group for people who have chronic pain. Keep all follow-up visits. This is important. Where to find more information You can find more information about managing pain without opioids from: American Academy of Pain Medicine: painmed.org Institute for Chronic Pain: instituteforchronicpain.org American Chronic  Pain Association: theacpa.org Contact a health care provider if: You have side effects from pain medicine. Your pain gets worse or does not get better with treatments or home therapy. You are struggling with anxiety or depression. Summary Many types of pain can be managed without opioids. Chronic pain may respond better to pain management without opioids. Pain is best managed when you and a team of health care providers work together. Pain management without opioids may include non-opioid medicines, medical treatments, physical therapy, mental health therapy, and lifestyle  changes. Tell your health care providers if your pain gets worse or is not being managed well enough. This information is not intended to replace advice given to you by your health care provider. Make sure you discuss any questions you have with your health care provider. Document Revised: 12/30/2020 Document Reviewed: 12/30/2020 Elsevier Patient Education  2024 Else[employer] Inc[employer]   Advanced directives: none  Conditions/risks identified: Aim for 30 minutes of exercise or brisk walking, 6-8 glasses of water, and 5 servings of fruits and vegetables each day.   Next appointment: Follow up in one year for your annual wellness visit 03/11/24 @ 9:45 Televisit   Preventive Care 65 Years and Older, Female Preventive care refers to lifestyle choices and visits with your health care provider that can promote health and wellness. What does preventive care include? A yearly physical exam. This is also called an annual well check. Dental exams once or twice a year. Routine eye exams. Ask your health care provider how often you should have your eyes checked. Personal lifestyle choices, including: Daily care of your teeth and gums. Regular physical activity. Eating a healthy diet. Avoiding tobacco and drug use. Limiting alcohol use. Practicing safe sex. Taking low-dose aspirin every day. Taking vitamin and mineral supplements as recommended by your health care provider. What happens during an annual well check? The services and screenings done by your health care provider during your annual well check will depend on your age, overall health, lifestyle risk factors, and family history of disease. Counseling  Your health care provider may ask you questions about your: Alcohol use. Tobacco use. Drug use. Emotional well-being. Home and relationship well-being. Sexual activity. Eating habits. History of falls. Memory and ability to understand (cognition). Work and work Astronomer. Reproductive  health. Screening  You may have the following tests or measurements: Height, weight, and BMI. Blood pressure. Lipid and cholesterol levels. These may be checked every 5 years, or more frequently if you are over 40 years old. Skin check. Lung cancer screening. You may have this screening every year starting at age 70 if you have a 30-pack-year history of smoking and currently smoke or have quit within the past 15 years. Fecal occult blood test (FOBT) of the stool. You may have this test every year starting at age 78. Flexible sigmoidoscopy or colonoscopy. You may have a sigmoidoscopy every 5 years or a colonoscopy every 10 years starting at age 10. Hepatitis C blood test. Hepatitis B blood test. Sexually transmitted disease (STD) testing. Diabetes screening. This is done by checking your blood sugar (glucose) after you have not eaten for a while (fasting). You may have this done every 1-3 years. Bone density scan. This is done to screen for osteoporosis. You may have this done starting at age 57. Mammogram. This may be done every 1-2 years. Talk to your health care provider about how often you should have regular mammograms. Talk with your health care provider about your test results, treatment options,  and if necessary, the need for more tests. Vaccines  Your health care provider may recommend certain vaccines, such as: Influenza vaccine. This is recommended every year. Tetanus, diphtheria, and acellular pertussis (Tdap, Td) vaccine. You may need a Td booster every 10 years. Zoster vaccine. You may need this after age 101. Pneumococcal 13-valent conjugate (PCV13) vaccine. One dose is recommended after age 34. Pneumococcal polysaccharide (PPSV23) vaccine. One dose is recommended after age 59. Talk to your health care provider about which screenings and vaccines you need and how often you need them. This information is not intended to replace advice given to you by your health care provider.  Make sure you discuss any questions you have with your health care provider. Document Released: 10/16/2015 Document Revised: 06/08/2016 Document Reviewed: 07/21/2015 Elsevier Interactive Patient Education  2017 [employer].  Fall Prevention in the Home Falls can cause injuries. They can happen to people of all ages. There are many things you can do to make your home safe and to help prevent falls. What can I do on the outside of my home? Regularly fix the edges of walkways and driveways and fix any cracks. Remove anything that might make you trip as you walk through a door, such as a raised step or threshold. Trim any bushes or trees on the path to your home. Use bright outdoor lighting. Clear any walking paths of anything that might make someone trip, such as rocks or tools. Regularly check to see if handrails are loose or broken. Make sure that both sides of any steps have handrails. Any raised decks and porches should have guardrails on the edges. Have any leaves, snow, or ice cleared regularly. Use sand or salt on walking paths during winter. Clean up any spills in your garage right away. This includes oil or grease spills. What can I do in the bathroom? Use night lights. Install grab bars by the toilet and in the tub and shower. Do not use towel bars as grab bars. Use non-skid mats or decals in the tub or shower. If you need to sit down in the shower, use a plastic, non-slip stool. Keep the floor dry. Clean up any water that spills on the floor as soon as it happens. Remove soap buildup in the tub or shower regularly. Attach bath mats securely with double-sided non-slip rug tape. Do not have throw rugs and other things on the floor that can make you trip. What can I do in the bedroom? Use night lights. Make sure that you have a light by your bed that is easy to reach. Do not use any sheets or blankets that are too big for your bed. They should not hang down onto the floor. Have a  firm chair that has side arms. You can use this for support while you get dressed. Do not have throw rugs and other things on the floor that can make you trip. What can I do in the kitchen? Clean up any spills right away. Avoid walking on wet floors. Keep items that you use a lot in easy-to-reach places. If you need to reach something above you, use a strong step stool that has a grab bar. Keep electrical cords out of the way. Do not use floor polish or wax that makes floors slippery. If you must use wax, use non-skid floor wax. Do not have throw rugs and other things on the floor that can make you trip. What can I do with my stairs? Do not leave  any items on the stairs. Make sure that there are handrails on both sides of the stairs and use them. Fix handrails that are broken or loose. Make sure that handrails are as long as the stairways. Check any carpeting to make sure that it is firmly attached to the stairs. Fix any carpet that is loose or worn. Avoid having throw rugs at the top or bottom of the stairs. If you do have throw rugs, attach them to the floor with carpet tape. Make sure that you have a light switch at the top of the stairs and the bottom of the stairs. If you do not have them, ask someone to add them for you. What else can I do to help prevent falls? Wear shoes that: Do not have high heels. Have rubber bottoms. Are comfortable and fit you well. Are closed at the toe. Do not wear sandals. If you use a stepladder: Make sure that it is fully opened. Do not climb a closed stepladder. Make sure that both sides of the stepladder are locked into place. Ask someone to hold it for you, if possible. Clearly mark and make sure that you can see: Any grab bars or handrails. First and last steps. Where the edge of each step is. Use tools that help you move around (mobility aids) if they are needed. These include: Canes. Walkers. Scooters. Crutches. Turn on the lights when you  go into a dark area. Replace any light bulbs as soon as they burn out. Set up your furniture so you have a clear path. Avoid moving your furniture around. If any of your floors are uneven, fix them. If there are any pets around you, be aware of where they are. Review your medicines with your doctor. Some medicines can make you feel dizzy. This can increase your chance of falling. Ask your doctor what other things that you can do to help prevent falls. This information is not intended to replace advice given to you by your health care provider. Make sure you discuss any questions you have with your health care provider. Document Released: 07/16/2009 Document Revised: 02/25/2016 Document Reviewed: 10/24/2014 Elsevier Interactive Patient Education  2017 [employer].

## 2023-03-10 ENCOUNTER — Telehealth: Payer: Self-pay | Admitting: Pharmacist

## 2023-03-10 ENCOUNTER — Ambulatory Visit: Payer: Medicare HMO | Admitting: Pharmacist

## 2023-03-10 DIAGNOSIS — F419 Anxiety disorder, unspecified: Secondary | ICD-10-CM

## 2023-03-10 MED ORDER — DULOXETINE HCL 60 MG PO CPEP: 60.0000 mg | ORAL_CAPSULE | Freq: Every day | ORAL | 2 refills | Status: DC

## 2023-03-10 NOTE — Telephone Encounter (Signed)
Patient has been taking duloxetine 30 mg daily consistently for about 5 weeks now. She reports improvement in mood and pain, but wonders if a higher dose will be even more beneficial. Discussed it would be reasonable to increase to 60 mg per day, but will consult with PCP.  Pt will be due for PCP appt in August, advised she schedule appt. Pt did not have calendar with her but voiced she would call office to schedule appt soon.

## 2023-03-10 NOTE — Progress Notes (Signed)
Care Management & Coordination Services Pharmacy Note  03/10/2023 Name:  Danielle Edwards MRN:  [id number] DOB:  1956/08/10  Summary: F/U visit -DM: A1c 8.2% (02/2023) worsened from 7.3% previously; after switching Soliquis from 30 units BID to 60 units once daily last month she reports improvement in BG; per CGM, she reports sugar stays "in the green" meaning between 70-180 most of the time -Mood: pt started taking duloxetine 30 mg regularly and has found it helpful for mood and pain, but wants to increase the dose to gain further benefit -Mild nausea: pt reports chronic, intermittent nausea for years; she started taking pantoprazole daily last month as well as made diet changes and reports improvement in symptoms   Recommendations/Changes made from today's visit: -Recommend to increase duloxetine to 60 mg daily - consulting with PCP -Advised pt to schedule PCP appt for August 2024  Follow up plan: -Pharmacist follow up televisit scheduled for 3 months -Hematology 06/01/23; PCP F/U due ~05/04/23    Subjective: Danielle Edwards is an 67 y.o. year old female who is a primary patient of Doreene Nest, NP.  The care coordination team was consulted for assistance with disease management and care coordination needs.    Engaged with patient by telephone for follow up visit.  Recent office visits: 02/01/23 NP Mayra Reel OV: f/u - A1c 8.2%; she has gained 20 lbs since last visit and wants to try Ozempic. Stop Soliqua, start Lantus + Ozempic. Frequent headaches - advised to start propranolol ER 80 mg. Schedule MRI. Resume Cymbalta 30 mg. Ordered DEXA and mammogram. F/u 3 months.  06/28/22 NP Mayra Reel OV: f/u - A1c 7.3%; Rx propranolol 80 mg for headache. Order MRI brain (never done)  Recent consult visits: 06/09/22 Dr Thedore Mins (Nephrology): printed info on SGLT2-I, pt will inquire ins. Coverage.  05/12/22 Dr Cathie Hoops (Oncology): f/u IDA. HGB improved 11.7. Rec IV venofer x 1 to improve iron stores.  Hospital  visits: None in previous 6 months   Objective:  Lab Results  Component Value Date   CREATININE 1.14 (H) 11/11/2022   BUN 33 (H) 11/11/2022   GFR 41.66 (L) 11/25/2021   GFRNONAA 53 (L) 11/11/2022   NA 134 (L) 11/11/2022   K 4.2 11/11/2022   CALCIUM 10.0 11/11/2022   CALCIUM 10.7 (H) 11/11/2022   CO2 26 11/11/2022   GLUCOSE 133 (H) 11/11/2022    Lab Results  Component Value Date/Time   HGBA1C 8.2 (A) 02/01/2023 12:42 PM   HGBA1C 7.3 (A) 06/28/2022 11:17 AM   HGBA1C 9.4 (H) 11/25/2021 01:22 PM   HGBA1C 10.9 (H) 06/11/2020 03:14 PM   GFR 41.66 (L) 11/25/2021 01:22 PM   GFR 44.74 (L) 12/25/2020 11:25 AM   MICROALBUR 4.4 (H) 08/05/2016 11:14 AM   MICROALBUR 2.0 (H) 04/07/2015 09:15 AM    Last diabetic Eye exam:  Lab Results  Component Value Date/Time   HMDIABEYEEXA Retinopathy (A) 08/01/2022 12:00 AM    Last diabetic Foot exam: No results found for: "HMDIABFOOTEX"   Lab Results  Component Value Date   CHOL 198 02/01/2023   HDL 53.40 02/01/2023   LDLCALC 44 11/25/2021   LDLDIRECT 117.0 02/01/2023   TRIG 202.0 (H) 02/01/2023   CHOLHDL 4 02/01/2023       Latest Ref Rng & Units 11/11/2022   10:58 AM 02/09/2022   12:03 PM 11/25/2021    1:22 PM  Hepatic Function  Total Protein 6.5 - 8.1 g/dL  7.9  7.5   Albumin 3.5 - 5.0 g/dL 3.7  3.9  4.2   AST 15 - 41 U/L  18  14   ALT 0 - 44 U/L  19  15   Alk Phosphatase 38 - 126 U/L  78  69   Total Bilirubin 0.3 - 1.2 mg/dL  0.6  0.3     Lab Results  Component Value Date/Time   TSH 3.23 02/01/2023 12:57 PM   TSH 2.84 11/25/2021 01:22 PM   FREET4 0.92 02/01/2023 12:57 PM       Latest Ref Rng & Units 11/11/2022   10:58 AM 09/06/2022    1:02 PM 05/11/2022   11:21 AM  CBC  WBC 4.0 - 10.5 K/uL 8.0  9.1  8.7   Hemoglobin 12.0 - 15.0 g/dL 16.1  09.6  04.5   Hematocrit 36.0 - 46.0 % 37.5  39.9  36.3   Platelets 150 - 400 K/uL 268  237  241    Iron/TIBC/Ferritin/ %Sat    Component Value Date/Time   IRON 57 09/06/2022 1302    TIBC 393 09/06/2022 1302   FERRITIN 51 09/06/2022 1302   IRONPCTSAT 15 09/06/2022 1302     Lab Results  Component Value Date/Time   VD25OH 62.85 11/25/2021 01:22 PM   VD25OH 54.23 12/25/2020 11:25 AM   VITAMINB12 444 09/17/2019 03:23 PM   VITAMINB12 378 03/23/2015 11:50 AM    Clinical ASCVD: No  The 10-year ASCVD risk score (Arnett DK, et al., 2019) is: 22.3%   Values used to calculate the score:     Age: 67 years     Sex: Female     Is Non-Hispanic African American: No     Diabetic: Yes     Tobacco smoker: No     Systolic Blood Pressure: 148 mmHg     Is BP treated: Yes     HDL Cholesterol: 53.4 mg/dL     Total Cholesterol: 198 mg/dL       4/0/9811   91:47 AM 02/24/2022    2:17 PM 02/23/2021    1:32 PM  Depression screen PHQ 2/9  Decreased Interest 0 0 0  Down, Depressed, Hopeless 0 1 0  PHQ - 2 Score 0 1 0  Altered sleeping   0  Tired, decreased energy   0  Change in appetite   0  Feeling bad or failure about yourself    0  Trouble concentrating   0  Moving slowly or fidgety/restless   0  Suicidal thoughts   0  PHQ-9 Score   0  Difficult doing work/chores   Not difficult at all     Social History   Tobacco Use  Smoking Status Never  Smokeless Tobacco Never   BP Readings from Last 3 Encounters:  02/01/23 130/82  10/26/22 (!) 150/69  06/28/22 128/64   Pulse Readings from Last 3 Encounters:  02/01/23 75  10/26/22 75  06/28/22 74   Wt Readings from Last 3 Encounters:  03/08/23 270 lb (122.5 kg)  02/01/23 277 lb (125.6 kg)  10/26/22 275 lb 8 oz (125 kg)   BMI Readings from Last 3 Encounters:  03/08/23 43.58 kg/m  02/01/23 44.71 kg/m  10/26/22 44.47 kg/m    No Known Allergies  Medications Reviewed Today     Reviewed by Kathyrn Sheriff, RPH (Pharmacist) on 03/10/23 at 1404  Med List Status: <None>   Medication Order Taking? Sig Documenting Provider Last Dose Status Informant  Accu-Chek FastClix Lancets MISC [id number] Yes USE AS  INSTRUCTED TO CHECK BLOOD SUGAR UP  TO 3 TIMES DAILY Doreene Nest, NP Taking Active   amLODipine (NORVASC) 10 MG tablet [id number] Yes TAKE ONE TABLET BY MOUTH ONCE A DAY FOR BLOOD PRESSURE Doreene Nest, NP Taking Active   atorvastatin (LIPITOR) 40 MG tablet [id number] Yes TAKE ONE TABLET BY MOUTH AT BEDTIME FOR CHOLESTEROL Doreene Nest, NP Taking Active   Cholecalciferol (VITAMIN D3) 125 MCG (5000 UT) CAPS [id number] Yes Take 5,000 Units by mouth daily. [provider] Taking Active   DULoxetine (CYMBALTA) 30 MG capsule [id number] Yes Take 1 capsule (30 mg total) by mouth daily. For pain, anxiety, depression. Doreene Nest, NP Taking Active   glucose blood (ACCU-CHEK GUIDE) test strip [id number] Yes USE UP TO FOUR TIMES DAILY TO CHECK BLOOD SUGAR (E10.9 E11.9) Doreene Nest, NP Taking Active   Insulin Glargine-Lixisenatide [facility]) 100-33 UNT-MCG/ML SOPN [id number] Yes Inject 60 Units into the skin daily. for diabetes. Doreene Nest, NP Taking Active   Insulin Pen Needle (BD PEN NEEDLE MICRO U/F) 32G X 6 MM MISC [id number] Yes USE TO INJECT INSULIN TWICE DAILY Doreene Nest, NP Taking Active   IRON, FERROUS SULFATE, PO [id number] Yes Take by mouth. [provider] Taking Active Self  loperamide (IMODIUM A-D) 2 MG tablet [id number] Yes Take 2 mg by mouth 4 (four) times daily as needed for diarrhea or loose stools. [provider] Taking Active   losartan (COZAAR) 100 MG tablet [id number] Yes TAKE ONE TABLET BY MOUTH ONCE A DAY FOR BLOOD PRESSURE Doreene Nest, NP Taking Active   Magnesium 200 MG TABS [id number] Yes Take by mouth daily. [provider] Taking Active   metFORMIN (GLUCOPHAGE-XR) 500 MG 24 hr tablet [id number] Yes Take 4 tablets (2,000 mg total) by mouth daily with breakfast. for diabetes. Doreene Nest, NP Taking Active   Multiple Vitamins-Minerals (CENTRUM SILVER ULTRA WOMENS PO) [id number] Yes Take by mouth daily.  [provider] Taking Active   oxyCODONE (OXY IR/ROXICODONE) 5 MG immediate release tablet [id number] Yes 1-2 tablets every 4-6 hr as needed; #225 per month [provider] Taking Active   pantoprazole (PROTONIX) 40 MG tablet [id number] Yes Take 40 mg by mouth daily. [provider] Taking Active   promethazine (PHENERGAN) 25 MG tablet [id number] Yes Take 25 mg by mouth every 6 (six) hours as needed. Reported on 09/21/2015 [provider] Taking Active            Med Note Laretta Bolster, [facility]   Fri Aug 05, 2016 10:19 AM)    propranolol ER (INDERAL LA) 80 MG 24 hr capsule [id number] Yes TAKE ONE CAPSULE BY MOUTH ONCE DAILY FOR HEADACHE PREVENTION Doreene Nest, NP Taking Active             SDOH:  (Social Determinants of Health) assessments and interventions performed: No SDOH Interventions    Flowsheet Row Clinical Support from 03/08/2023 in [facility] Clinical Support from 02/24/2022 in [facility] Clinical Support from 02/23/2021 in [facility] Chronic Care Management from 12/08/2020 in [facility] Clinical Support from 09/12/2019 in [facility] [place]  SDOH Interventions       Food Insecurity Interventions Intervention Not Indicated Intervention Not Indicated -- -- --  Housing Interventions Intervention Not Indicated Intervention Not Indicated -- -- --  Transportation Interventions Intervention Not Indicated Intervention Not Indicated -- -- --  Utilities  Interventions Intervention Not Indicated -- -- -- --  Alcohol Usage Interventions Intervention Not Indicated (Score <7) -- -- -- --  Depression Interventions/Treatment  -- -- PHQ2-9 Score <4 Follow-up Not Indicated -- PHQ2-9 Score <4 Follow-up Not Indicated  Financial Strain Interventions Intervention Not Indicated Intervention Not Indicated  -- Intervention Not Indicated  [Medications affordable] --  Physical Activity Interventions Intervention Not Indicated, Patient Declined Intervention Not Indicated -- -- --  Stress Interventions Intervention Not Indicated Intervention Not Indicated -- -- --  Social Connections Interventions Intervention Not Indicated, Patient Declined Intervention Not Indicated -- -- --      SDOH Screenings   Food Insecurity: No Food Insecurity (03/08/2023)  Housing: Low Risk  (03/08/2023)  Transportation Needs: No Transportation Needs (03/08/2023)  Utilities: Not At Risk (03/08/2023)  Alcohol Screen: Low Risk  (03/08/2023)  Depression (PHQ2-9): Low Risk  (03/08/2023)  Financial Resource Strain: Low Risk  (03/08/2023)  Physical Activity: Inactive (03/08/2023)  Social Connections: Socially Integrated (03/08/2023)  Stress: No Stress Concern Present (03/08/2023)  Recent Concern: Stress - Stress Concern Present (01/31/2023)  Tobacco Use: Low Risk  (03/08/2023)    Medication Assistance: None required.  Patient affirms current coverage meets needs.  Medication Access: Within the past 30 days, how often has patient missed a dose of medication? 0 Is a pillbox or other method used to improve adherence? No  Factors that may affect medication adherence? lack of understanding of disease management Are meds synced by current pharmacy? No  Are meds delivered by current pharmacy? No  Does patient experience delays in picking up medications due to transportation concerns? No   [facility] Services Reviewed: Is patient disadvantaged to use [facility]?: No  Current Rx insurance plan: [employer] Name and location of Current pharmacy:  [facility] - [place], [place] - [address] [place] [address] Phone: [phone] Fax: [phone]  [facility] services reviewed with patient today?: No  Patient requests to transfer care to [facility]?: No  Reason patient declined to change  pharmacies: Loyalty to other pharmacy/Patient preference  Compliance/Adherence/Medication fill history: Care Gaps: Foot exam (due 05/2022) UACR (due 08/2017) - done 06/2022 @ [facility]  Star-Rating Drugs: Atorvastatin - PDC 65%; LF 10/17/22 x 90 ds --pt stopped atorvastatin summer 2023 but restarted and has been adherent since Losartan - PDC 100% Metformin - PDC 94%   Assessment/Plan  Diabetes (A1c goal <7%) -Not ideally controlled- A1c 8.2% (02/2023), worsened from 7.3%; pt has made an effort to cut out carbs/sweets and has changed Soliqua to 60 units at once (instead of 30 u BID) and BG has improved -Current home glucose readings: Freestyle Libre 2 (app) -pt reports fasting BG <100;  -Current medications: Soliqua 60 units daily - Appropriate, Query Effective Metformin XR 500 mg - 4 tab daily - Appropriate, Effective, Safe, Accessible Freestyle Libre 2 - Appropriate, Effective, Safe, Accessible -Medications previously tried: glimepiride, Jardiance (vaginal discomfort/yeast infections), Januvia (cost), Trulicity (cost) -Educated on A1c and blood sugar goals; Prevention and management of hypoglycemic episodes; Continuous glucose monitoring; -Recommended to continue current medication  Depression / Anxiety (Goal: manage symptoms) -Pt reports improvement in mood, pain taking duloxetine consistently; she does wonder if a higher dose will be even more beneficial -pt reports hx of ADD, she was on Straterra in the past -Current treatment  Duloxetine 30 mg daily - Appropriate, Effective, Safe, Accessible -Medications previously tried: paroxetine -Discussed adequate trial of duloxetine is 4-6 weeks, may consider increasing dose after that -Recommended to increase duloxetine  to 60 mg daily - consulting with PCP  Hypertension / CKD Stage 3b (BP goal <140/90) -Controlled - controlled per clinic readings, no updates today, stable  -Current treatment: Losartan 100 mg  daily -  Appropriate, Effective, Safe, Accessible Amlodipine 40 mg daily - Appropriate, Effective, Safe, Accessible -Medications previously tried: none reported -Current home readings: none reported, not routinely monitoring  -Denies hypotensive/hypertensive symptoms -Recommended to continue current medication  Hyperlipidemia: (LDL goal < 70) -Not ideally controlled - LDL 117 (02/2023) significantly worsened from 44 (11/2021) after changing atorvastatin to once a week; she has resumed statin daily due to lab results -Current treatment: Atorvastatin 40 mg daily AM - Appropriate, Effective, Safe, Accessible -Medications previously tried: none reported -Reviewed importance of daily statin adherence -Recommended to continue current medication  Health Maintenance -Vaccine gaps: None -DEXA scan 06/2015 - normal -Frequent headaches: on propranolol ER 80 mg daily - Appropriate, Effective, Safe, Accessible -Nausea: taking promethazine PRN; pt reports nausea is chronic/intermittent; she reports improvement in nausea after changing diet and taking pantoprazole on daily basis   Al Corpus, PharmD, [place], CPP Clinical Pharmacist Practitioner [facility] at [facility] [phone]

## 2023-03-10 NOTE — Telephone Encounter (Signed)
Agree with dose increase of Cymbalta to 60 mg. I will send a new prescription to her pharmacy now.  She will take 2 of her 30 mg capsules to equal 60 mg if she has a lot at home still.  I will send a new prescription for the 60 mg dose to the pharmacy, only take 1 of these once daily when she gets her new prescription.

## 2023-03-13 NOTE — Telephone Encounter (Signed)
Unable to reach patient. Left voicemail to return call to our office.   

## 2023-03-13 NOTE — Telephone Encounter (Signed)
Patient returned call to the office, would like a call back whenever possible.

## 2023-03-13 NOTE — Telephone Encounter (Signed)
Called patient and reviewed all information. Patient verbalized understanding. Will call if any further questions.  

## 2023-03-17 ENCOUNTER — Other Ambulatory Visit: Payer: Self-pay | Admitting: Primary Care

## 2023-03-17 DIAGNOSIS — E1165 Type 2 diabetes mellitus with hyperglycemia: Secondary | ICD-10-CM

## 2023-03-27 ENCOUNTER — Telehealth: Payer: Self-pay | Admitting: Gastroenterology

## 2023-03-27 NOTE — Telephone Encounter (Signed)
Good Morning Dr Leone Payor,  This patient is wondering if it would be okay to have her recall with you since she is a previous patient of yours and Dr Orvan Falconer is no longer here. Please advise

## 2023-03-28 NOTE — Telephone Encounter (Signed)
Yes - she can schedule colonoscopy with me

## 2023-03-28 NOTE — Telephone Encounter (Signed)
LVM to advise pt of Dr Marvell Fuller note below

## 2023-03-29 ENCOUNTER — Other Ambulatory Visit: Payer: Self-pay | Admitting: Primary Care

## 2023-03-29 DIAGNOSIS — R519 Headache, unspecified: Secondary | ICD-10-CM

## 2023-03-30 DIAGNOSIS — M5382 Other specified dorsopathies, cervical region: Secondary | ICD-10-CM | POA: Diagnosis not present

## 2023-03-30 DIAGNOSIS — M797 Fibromyalgia: Secondary | ICD-10-CM | POA: Diagnosis not present

## 2023-04-10 DIAGNOSIS — E119 Type 2 diabetes mellitus without complications: Secondary | ICD-10-CM | POA: Diagnosis not present

## 2023-04-19 ENCOUNTER — Other Ambulatory Visit: Payer: Self-pay | Admitting: Primary Care

## 2023-04-19 DIAGNOSIS — E1165 Type 2 diabetes mellitus with hyperglycemia: Secondary | ICD-10-CM

## 2023-04-19 DIAGNOSIS — I10 Essential (primary) hypertension: Secondary | ICD-10-CM

## 2023-05-17 DIAGNOSIS — R600 Localized edema: Secondary | ICD-10-CM | POA: Diagnosis not present

## 2023-05-17 DIAGNOSIS — D508 Other iron deficiency anemias: Secondary | ICD-10-CM | POA: Diagnosis not present

## 2023-05-17 DIAGNOSIS — E1122 Type 2 diabetes mellitus with diabetic chronic kidney disease: Secondary | ICD-10-CM | POA: Diagnosis not present

## 2023-05-17 DIAGNOSIS — I10 Essential (primary) hypertension: Secondary | ICD-10-CM | POA: Diagnosis not present

## 2023-05-17 DIAGNOSIS — R300 Dysuria: Secondary | ICD-10-CM | POA: Diagnosis not present

## 2023-05-17 DIAGNOSIS — N1831 Chronic kidney disease, stage 3a: Secondary | ICD-10-CM | POA: Diagnosis not present

## 2023-05-25 ENCOUNTER — Other Ambulatory Visit: Payer: Self-pay | Admitting: Primary Care

## 2023-05-25 DIAGNOSIS — E1165 Type 2 diabetes mellitus with hyperglycemia: Secondary | ICD-10-CM

## 2023-05-25 DIAGNOSIS — M5382 Other specified dorsopathies, cervical region: Secondary | ICD-10-CM | POA: Diagnosis not present

## 2023-05-25 DIAGNOSIS — M797 Fibromyalgia: Secondary | ICD-10-CM | POA: Diagnosis not present

## 2023-05-25 DIAGNOSIS — R519 Headache, unspecified: Secondary | ICD-10-CM | POA: Diagnosis not present

## 2023-05-30 ENCOUNTER — Other Ambulatory Visit: Payer: Medicare HMO

## 2023-05-31 ENCOUNTER — Inpatient Hospital Stay: Payer: Medicare HMO | Attending: Oncology

## 2023-05-31 DIAGNOSIS — D509 Iron deficiency anemia, unspecified: Secondary | ICD-10-CM | POA: Insufficient documentation

## 2023-05-31 DIAGNOSIS — N183 Chronic kidney disease, stage 3 unspecified: Secondary | ICD-10-CM | POA: Insufficient documentation

## 2023-05-31 DIAGNOSIS — Z808 Family history of malignant neoplasm of other organs or systems: Secondary | ICD-10-CM | POA: Diagnosis not present

## 2023-05-31 DIAGNOSIS — Z801 Family history of malignant neoplasm of trachea, bronchus and lung: Secondary | ICD-10-CM | POA: Diagnosis not present

## 2023-05-31 DIAGNOSIS — D508 Other iron deficiency anemias: Secondary | ICD-10-CM

## 2023-05-31 DIAGNOSIS — Z800 Family history of malignant neoplasm of digestive organs: Secondary | ICD-10-CM | POA: Diagnosis not present

## 2023-05-31 LAB — CBC WITH DIFFERENTIAL/PLATELET
Abs Immature Granulocytes: 0.03 10*3/uL (ref 0.00–0.07)
Basophils Absolute: 0.1 10*3/uL (ref 0.0–0.1)
Basophils Relative: 1 %
Eosinophils Absolute: 0.3 10*3/uL (ref 0.0–0.5)
Eosinophils Relative: 4 %
HCT: 37.2 % (ref 36.0–46.0)
Hemoglobin: 11.9 g/dL — ABNORMAL LOW (ref 12.0–15.0)
Immature Granulocytes: 0 %
Lymphocytes Relative: 30 %
Lymphs Abs: 2.3 10*3/uL (ref 0.7–4.0)
MCH: 29.8 pg (ref 26.0–34.0)
MCHC: 32 g/dL (ref 30.0–36.0)
MCV: 93.2 fL (ref 80.0–100.0)
Monocytes Absolute: 0.4 10*3/uL (ref 0.1–1.0)
Monocytes Relative: 5 %
Neutro Abs: 4.5 10*3/uL (ref 1.7–7.7)
Neutrophils Relative %: 60 %
Platelets: 277 10*3/uL (ref 150–400)
RBC: 3.99 MIL/uL (ref 3.87–5.11)
RDW: 12.9 % (ref 11.5–15.5)
WBC: 7.6 10*3/uL (ref 4.0–10.5)
nRBC: 0 % (ref 0.0–0.2)

## 2023-05-31 LAB — IRON AND TIBC
Iron: 52 ug/dL (ref 28–170)
Saturation Ratios: 15 % (ref 10.4–31.8)
TIBC: 357 ug/dL (ref 250–450)
UIBC: 305 ug/dL

## 2023-05-31 LAB — FERRITIN: Ferritin: 39 ng/mL (ref 11–307)

## 2023-06-01 ENCOUNTER — Encounter: Payer: Self-pay | Admitting: Oncology

## 2023-06-01 ENCOUNTER — Inpatient Hospital Stay: Payer: Medicare HMO

## 2023-06-01 ENCOUNTER — Inpatient Hospital Stay: Payer: Medicare HMO | Admitting: Oncology

## 2023-06-01 VITALS — BP 129/69 | HR 99 | Temp 97.7°F | Resp 18 | Wt 262.9 lb

## 2023-06-01 DIAGNOSIS — Z801 Family history of malignant neoplasm of trachea, bronchus and lung: Secondary | ICD-10-CM | POA: Diagnosis not present

## 2023-06-01 DIAGNOSIS — D508 Other iron deficiency anemias: Secondary | ICD-10-CM

## 2023-06-01 DIAGNOSIS — N183 Chronic kidney disease, stage 3 unspecified: Secondary | ICD-10-CM | POA: Diagnosis not present

## 2023-06-01 DIAGNOSIS — Z808 Family history of malignant neoplasm of other organs or systems: Secondary | ICD-10-CM | POA: Diagnosis not present

## 2023-06-01 DIAGNOSIS — D509 Iron deficiency anemia, unspecified: Secondary | ICD-10-CM | POA: Diagnosis not present

## 2023-06-01 DIAGNOSIS — Z800 Family history of malignant neoplasm of digestive organs: Secondary | ICD-10-CM | POA: Diagnosis not present

## 2023-06-01 DIAGNOSIS — N1831 Chronic kidney disease, stage 3a: Secondary | ICD-10-CM | POA: Diagnosis not present

## 2023-06-01 MED ORDER — SODIUM CHLORIDE 0.9 % IV SOLN
200.0000 mg | Freq: Once | INTRAVENOUS | Status: AC
  Administered 2023-06-01: 200 mg via INTRAVENOUS
  Filled 2023-06-01: qty 200

## 2023-06-01 MED ORDER — SODIUM CHLORIDE 0.9 % IV SOLN
Freq: Once | INTRAVENOUS | Status: AC
  Filled 2023-06-01: qty 250

## 2023-06-01 NOTE — Progress Notes (Signed)
Hematology/Oncology Progress note [phone] [phone]      CHIEF COMPLAINTS/REASON FOR VISIT:  Follow-up for iron deficiency anemia  ASSESSMENT & PLAN:   IDA (iron deficiency anemia) #Iron deficiency anemia, Labs are reviewed and discussed with patient.  Lab Results  Component Value Date   HGB 11.9 (L) 05/31/2023   TIBC 357 05/31/2023   IRONPCTSAT 15 05/31/2023   FERRITIN 39 05/31/2023    Recommend IV Venofer 200mg  x 1   CKD (chronic kidney disease) stage 3, GFR 30-59 ml/min (HCC) Encourage oral hydration and avoid nephrotoxins.     Orders Placed This Encounter  Procedures   CBC with Differential (Cancer Center Only)    Standing Status:   Future    Standing Expiration Date:   05/31/2024   Iron and TIBC    Standing Status:   Future    Standing Expiration Date:   05/31/2024   Ferritin    Standing Status:   Future    Standing Expiration Date:   05/31/2024   Follow-up in 6 months All questions were answered. The patient knows to call the clinic with any problems, questions or concerns.  Rickard Patience, MD, PhD [facility] Hematology Oncology 06/01/2023     HISTORY OF PRESENTING ILLNESS:   Danielle Edwards is a  67 y.o.  female presents for iron deficiency anemia management. Patient reports feeling well.   She tolerates IV Venofer treatments   Review of Systems  Constitutional:  Negative for appetite change, chills, fatigue and fever.  HENT:   Negative for hearing loss and voice change.   Eyes:  Negative for eye problems.  Respiratory:  Negative for chest tightness and cough.   Cardiovascular:  Negative for chest pain.  Gastrointestinal:  Negative for abdominal distention, abdominal pain, blood in stool, nausea and vomiting.  Endocrine: Negative for hot flashes.  Genitourinary:  Negative for difficulty urinating, dysuria and frequency.   Musculoskeletal:  Negative for arthralgias.  Skin:  Negative for itching and rash.  Neurological:  Negative  for extremity weakness.  Hematological:  Negative for adenopathy.  Psychiatric/Behavioral:  Negative for confusion.     MEDICAL HISTORY:  Past Medical History:  Diagnosis Date   Allergy    seasonal allergies   Anemia    hx of IDA   Anxiety    on meds   Arthritis    PRN meds-osteoarthritis   Cataract    sx to remove   Chronic kidney disease    CKD-stage 3   Chronic pain    COVID-19 06/02/2020   Depression    on meds   Diabetes type 2, controlled (HCC)    on meds   Fibromyalgia    GERD (gastroesophageal reflux disease)    on meds   Headache    History of colonic polyps 1999   Benign   Hx of adenomatous polyp of colon 09/27/2000   2001 - diminutive adenoma [place]hour 2007 no polyps - [place]hour   Hyperlipidemia    on meds   Hypertension    on meds   Hypothyroidism    not on meds at this time   IBS (irritable bowel syndrome)    Diarrhea type   IDA (iron deficiency anemia) 02/09/2022   Neuromuscular disorder (HCC)    Vitamin D deficiency     SURGICAL HISTORY: Past Surgical History:  Procedure Laterality Date   CHOLECYSTECTOMY  1995   COLONOSCOPY  multiple   COLONOSCOPY  2016   TA's   NASAL SINUS SURGERY  2001  POLYPECTOMY     TA's   TONSILLECTOMY AND ADENOIDECTOMY     VAGINAL HYSTERECTOMY     Complete   WISDOM TOOTH EXTRACTION      SOCIAL HISTORY: Social History   Socioeconomic History   Marital status: Married    Spouse name: Not on file   Number of children: Not on file   Years of education: Not on file   Highest education level: 12th grade  Occupational History   Not on file  Tobacco Use   Smoking status: Never   Smokeless tobacco: Never  Vaping Use   Vaping status: Never Used  Substance and Sexual Activity   Alcohol use: Yes    Alcohol/week: 0.0 standard drinks of alcohol    Comment: rarely   Drug use: No   Sexual activity: Not on file  Other Topics Concern   Not on file  Social History Narrative   Married.   3 children.   Retired,  worked at [employer] in [place], [place].   Enjoys being with her grandchildren, being with her friends.   Social Determinants of Health   Financial Resource Strain: Low Risk  (03/08/2023)   Overall Financial Resource Strain (CARDIA)    Difficulty of Paying Living Expenses: Not hard at all  Food Insecurity: No Food Insecurity (03/08/2023)   Hunger Vital Sign    Worried About Running Out of Food in the Last Year: Never true    Ran Out of Food in the Last Year: Never true  Transportation Needs: No Transportation Needs (03/08/2023)   PRAPARE - Administrator, Civil Service (Medical): No    Lack of Transportation (Non-Medical): No  Physical Activity: Inactive (03/08/2023)   Exercise Vital Sign    Days of Exercise per Week: 0 days    Minutes of Exercise per Session: 0 min  Stress: No Stress Concern Present (03/08/2023)   [employer] of Occupational Health - Occupational Stress Questionnaire    Feeling of Stress : Not at all  Recent Concern: Stress - Stress Concern Present (01/31/2023)   [employer] of Occupational Health - Occupational Stress Questionnaire    Feeling of Stress : To some extent  Social Connections: Socially Integrated (03/08/2023)   Social Connection and Isolation Panel [NHANES]    Frequency of Communication with Friends and Family: More than three times a week    Frequency of Social Gatherings with Friends and Family: More than three times a week    Attends Religious Services: More than 4 times per year    Active Member of [employer] or Organizations: Yes    Attends Engineer, structural: More than 4 times per year    Marital Status: Married  Catering manager Violence: Not At Risk (03/08/2023)   Humiliation, Afraid, Rape, and Kick questionnaire    Fear of Current or Ex-Partner: No    Emotionally Abused: No    Physically Abused: No    Sexually Abused: No    FAMILY HISTORY: Family History  Problem Relation Age of Onset   Colon cancer Mother 19   Lung cancer  Mother 48   Skin cancer Mother    Colon polyps Mother 44   Heart disease Father    Diabetes Father    Skin cancer Father    Colon polyps Brother 48   Stomach cancer Paternal Grandmother 16   Breast cancer Neg Hx    Esophageal cancer Neg Hx    Rectal cancer Neg Hx     ALLERGIES:  has  No Known Allergies.  MEDICATIONS:  Current Outpatient Medications  Medication Sig Dispense Refill   Accu-Chek FastClix Lancets MISC USE AS INSTRUCTED TO CHECK BLOOD SUGAR UP TO 3 TIMES DAILY 306 each 1   amLODipine (NORVASC) 10 MG tablet Take 1 tablet (10 mg total) by mouth daily. for blood pressure. 90 tablet 2   atorvastatin (LIPITOR) 40 MG tablet TAKE ONE TABLET BY MOUTH AT BEDTIME FOR CHOLESTEROL 90 tablet 3   Cholecalciferol (VITAMIN D3) 125 MCG (5000 UT) CAPS Take 5,000 Units by mouth daily.     DULoxetine (CYMBALTA) 60 MG capsule Take 1 capsule (60 mg total) by mouth daily. for anxiety and depression. 90 capsule 2   glucose blood (ACCU-CHEK GUIDE) test strip USE UP TO FOUR TIMES DAILY TO CHECK BLOOD SUGAR (E10.9 E11.9) 200 each 5   Insulin Glargine-Lixisenatide (SOLIQUA) 100-33 UNT-MCG/ML SOPN Inject 60 Units into the skin daily. for diabetes. 60 mL 0   Insulin Pen Needle (BD PEN NEEDLE MICRO U/F) 32G X 6 MM MISC Use once daily to inject insulin. 100 each 0   IRON, FERROUS SULFATE, PO Take by mouth.     loperamide (IMODIUM A-D) 2 MG tablet Take 2 mg by mouth 4 (four) times daily as needed for diarrhea or loose stools.     losartan (COZAAR) 100 MG tablet Take 1 tablet (100 mg total) by mouth daily. for blood pressure. 90 tablet 2   Magnesium 200 MG TABS Take by mouth daily.     metFORMIN (GLUCOPHAGE-XR) 500 MG 24 hr tablet TAKE FOUR TABLETS (2,000 MG TOTAL) BY MOUTH DAILY WITH BREAKFAST. FOR DIABETES. 360 tablet 0   Multiple Vitamins-Minerals (CENTRUM SILVER ULTRA WOMENS PO) Take by mouth daily.     oxyCODONE (OXY IR/ROXICODONE) 5 MG immediate release tablet 1-2 tablets every 4-6 hr as needed; #225  per month     pantoprazole (PROTONIX) 40 MG tablet Take 40 mg by mouth daily.     promethazine (PHENERGAN) 25 MG tablet Take 25 mg by mouth every 6 (six) hours as needed. Reported on 09/21/2015  1   propranolol ER (INDERAL LA) 80 MG 24 hr capsule TAKE ONE CAPSULE BY MOUTH ONCE DAILY FOR HEADACHE PREVENTION 90 capsule 2   No current facility-administered medications for this visit.     PHYSICAL EXAMINATION: ECOG PERFORMANCE STATUS: 1 - Symptomatic but completely ambulatory Vitals:   06/01/23 1415  BP: 129/69  Pulse: 99  Resp: 18  Temp: 97.7 F (36.5 C)   Filed Weights   06/01/23 1415  Weight: 262 lb 14.4 oz (119.3 kg)    Physical Exam Constitutional:      General: She is not in acute distress.    Appearance: She is obese.  HENT:     Head: Normocephalic and atraumatic.  Eyes:     General: No scleral icterus. Cardiovascular:     Rate and Rhythm: Normal rate and regular rhythm.     Heart sounds: Normal heart sounds.  Pulmonary:     Effort: Pulmonary effort is normal. No respiratory distress.     Breath sounds: No wheezing.  Abdominal:     General: Bowel sounds are normal. There is no distension.     Palpations: Abdomen is soft.  Musculoskeletal:        General: No deformity. Normal range of motion.     Cervical back: Normal range of motion and neck supple.  Skin:    General: Skin is warm and dry.     Findings: No erythema or  rash.  Neurological:     Mental Status: She is alert and oriented to person, place, and time. Mental status is at baseline.     Cranial Nerves: No cranial nerve deficit.     Coordination: Coordination normal.  Psychiatric:        Mood and Affect: Mood normal.     LABORATORY DATA:  I have reviewed the data as listed     Latest Ref Rng & Units 05/31/2023   11:46 AM 11/11/2022   10:58 AM 09/06/2022    1:02 PM  CBC  WBC 4.0 - 10.5 K/uL 7.6  8.0  9.1   Hemoglobin 12.0 - 15.0 g/dL 16.1  09.6  04.5   Hematocrit 36.0 - 46.0 % 37.2  37.5  39.9    Platelets 150 - 400 K/uL 277  268  237       Latest Ref Rng & Units 11/11/2022   10:58 AM 02/09/2022   12:03 PM 01/03/2022    1:09 PM  CMP  Glucose 70 - 99 mg/dL 409  811    BUN 8 - 23 mg/dL 33  28    Creatinine 9.14 - 1.00 mg/dL 7.82  9.56    Sodium 213 - 145 mmol/L 134  136    Potassium 3.5 - 5.1 mmol/L 4.2  4.0  4.4   Chloride 98 - 111 mmol/L 100  105    CO2 22 - 32 mmol/L 26  24    Calcium 8.9 - 10.3 mg/dL 8.7 - 08.6 mg/dL 57.8    46.9  9.2    Total Protein 6.5 - 8.1 g/dL  7.9    Total Bilirubin 0.3 - 1.2 mg/dL  0.6    Alkaline Phos 38 - 126 U/L  78    AST 15 - 41 U/L  18    ALT 0 - 44 U/L  19      Iron/TIBC/Ferritin/ %Sat    Component Value Date/Time   IRON 52 05/31/2023 1146   TIBC 357 05/31/2023 1146   FERRITIN 39 05/31/2023 1146   IRONPCTSAT 15 05/31/2023 1146      RADIOGRAPHIC STUDIES: I have personally reviewed the radiological images as listed and agreed with the findings in the report. No results found.

## 2023-06-01 NOTE — Assessment & Plan Note (Addendum)
#  Iron deficiency anemia, Labs are reviewed and discussed with patient.  Lab Results  Component Value Date   HGB 11.9 (L) 05/31/2023   TIBC 357 05/31/2023   IRONPCTSAT 15 05/31/2023   FERRITIN 39 05/31/2023    Recommend IV Venofer 200mg  x 1

## 2023-06-01 NOTE — Assessment & Plan Note (Signed)
Encourage oral hydration and avoid nephrotoxins.   

## 2023-06-01 NOTE — Progress Notes (Signed)
Pt here for follow up. No new concerns voiced.   

## 2023-06-09 ENCOUNTER — Encounter: Payer: Medicare HMO | Admitting: Pharmacist

## 2023-06-14 ENCOUNTER — Other Ambulatory Visit: Payer: Self-pay | Admitting: Primary Care

## 2023-06-14 ENCOUNTER — Encounter: Payer: Self-pay | Admitting: Primary Care

## 2023-06-14 ENCOUNTER — Ambulatory Visit (INDEPENDENT_AMBULATORY_CARE_PROVIDER_SITE_OTHER): Payer: Medicare HMO | Admitting: Primary Care

## 2023-06-14 VITALS — BP 136/82 | HR 91 | Temp 97.3°F | Ht 66.0 in | Wt 262.0 lb

## 2023-06-14 DIAGNOSIS — K219 Gastro-esophageal reflux disease without esophagitis: Secondary | ICD-10-CM

## 2023-06-14 DIAGNOSIS — Z794 Long term (current) use of insulin: Secondary | ICD-10-CM | POA: Diagnosis not present

## 2023-06-14 DIAGNOSIS — Z23 Encounter for immunization: Secondary | ICD-10-CM | POA: Diagnosis not present

## 2023-06-14 DIAGNOSIS — G479 Sleep disorder, unspecified: Secondary | ICD-10-CM | POA: Insufficient documentation

## 2023-06-14 DIAGNOSIS — R300 Dysuria: Secondary | ICD-10-CM | POA: Insufficient documentation

## 2023-06-14 DIAGNOSIS — R519 Headache, unspecified: Secondary | ICD-10-CM | POA: Diagnosis not present

## 2023-06-14 DIAGNOSIS — E1165 Type 2 diabetes mellitus with hyperglycemia: Secondary | ICD-10-CM

## 2023-06-14 DIAGNOSIS — E039 Hypothyroidism, unspecified: Secondary | ICD-10-CM

## 2023-06-14 DIAGNOSIS — F32A Depression, unspecified: Secondary | ICD-10-CM

## 2023-06-14 DIAGNOSIS — F419 Anxiety disorder, unspecified: Secondary | ICD-10-CM

## 2023-06-14 DIAGNOSIS — I10 Essential (primary) hypertension: Secondary | ICD-10-CM | POA: Diagnosis not present

## 2023-06-14 LAB — POC URINALSYSI DIPSTICK (AUTOMATED)
Bilirubin, UA: POSITIVE
Blood, UA: NEGATIVE
Glucose, UA: NEGATIVE
Ketones, UA: NEGATIVE
Leukocytes, UA: NEGATIVE
Nitrite, UA: NEGATIVE
Protein, UA: NEGATIVE
Spec Grav, UA: 1.01 (ref 1.010–1.025)
Urobilinogen, UA: 0.2 U/dL
pH, UA: 6 (ref 5.0–8.0)

## 2023-06-14 LAB — POCT GLYCOSYLATED HEMOGLOBIN (HGB A1C): Hemoglobin A1C: 7.4 % — AB (ref 4.0–5.6)

## 2023-06-14 MED ORDER — PROPRANOLOL HCL ER 120 MG PO CP24
120.0000 mg | ORAL_CAPSULE | Freq: Every day | ORAL | 0 refills | Status: DC
Start: 2023-06-14 — End: 2023-10-18

## 2023-06-14 NOTE — Assessment & Plan Note (Signed)
Overall controlled.  Continue amlodipine 10 mg daily, losartan 100 mg daily. CMP pending.

## 2023-06-14 NOTE — Assessment & Plan Note (Addendum)
UA today negative. Follow-up with urology as scheduled.

## 2023-06-14 NOTE — Progress Notes (Signed)
Subjective:    Patient ID: Danielle Edwards, female    DOB: 06/19/1956, 67 y.o.   MRN: [id number]  HPI  Danielle Edwards is a very pleasant 67 y.o. female who presents today for follow up of diabetes and to discuss several other concerns.  1) GAD: Chronic. She's going through a lot of personal stress with her family. She is managed on Cymbalta 60 mg daily which helps overall. She is also managed on propranolol ER 80 mg for which helps with night time anxiety. She would like to start meeting with therapy again.   2) Bladder Pressure: She would also like to discuss vaginal and bladder discomfort. Chronic and intermittent. Symptoms include bladder pressure, dysuria, vaginal itching. Follows with nephrology who has recently referred her to Urology. She has an appointment scheduled with urology next week.   3) Frequent Headaches: She continues to experience daily headaches despite management on propranolol ER 80 mg which was initiated in May 2024. She thinks she has noticed some improvement in headaches. During this visit she was advised to complete a MRI of her brain for which she has not scheduled her MRI.   Headaches are typically located to the frontal lobes bilaterally, sometimes to the parietal region on different sides. She denies photophobia and phonophobia.   4) Sleep Disturbance: Chronic for years. She typically goes to bed around 1 am to 2 am. She wakes up at 4 am with her husband who goes to work. She cannot typically fall back asleep immediately, will fall back asleep at 6 am, wakes up again around 9 am. She is a light sleeper. Her husband will be going out of town soon, she is feeling nervous about this. She has home security cameras that do alert her when something like a deer walks by. She does look at her phone or TV just prior to bedtime.   5) Type 2 Diabetes:  Current medications include: Metformin XR 2000 mg daily, Soliqua 100-33 mg, 60 units daily.  She is checking her blood  glucose 0 times daily.   Last A1C: 8.2 in May 2024, 7.4 today Last Eye Exam: UTD Last Foot Exam: Due Pneumonia Vaccination: 2022 Urine Microalbumin: UTD Statin: atorvastatin   Dietary changes since last visit: None. Increased ice cream consumption.    Exercise: None    BP Readings from Last 3 Encounters:  06/14/23 136/82  06/01/23 129/69  02/01/23 130/82         Review of Systems  Respiratory:  Negative for shortness of breath.   Cardiovascular:  Negative for chest pain.  Genitourinary:  Positive for dysuria.       Bladder presssure  Neurological:  Positive for headaches.  Psychiatric/Behavioral:  Positive for sleep disturbance. The patient is nervous/anxious.          Past Medical History:  Diagnosis Date   Allergy    seasonal allergies   Anemia    hx of IDA   Anxiety    on meds   Arthritis    PRN meds-osteoarthritis   Cataract    sx to remove   Chronic kidney disease    CKD-stage 3   Chronic pain    COVID-19 06/02/2020   Depression    on meds   Diabetes type 2, controlled (HCC)    on meds   Fibromyalgia    GERD (gastroesophageal reflux disease)    on meds   Headache    History of colonic polyps 1999   Benign   Hx of  adenomatous polyp of colon 09/27/2000   2001 - diminutive adenoma [place]hour 2007 no polyps - [place]hour   Hyperlipidemia    on meds   Hypertension    on meds   Hypothyroidism    not on meds at this time   IBS (irritable bowel syndrome)    Diarrhea type   IDA (iron deficiency anemia) 02/09/2022   Neuromuscular disorder (HCC)    Vitamin D deficiency     Social History   Socioeconomic History   Marital status: Married    Spouse name: Not on file   Number of children: Not on file   Years of education: Not on file   Highest education level: 12th grade  Occupational History   Not on file  Tobacco Use   Smoking status: Never   Smokeless tobacco: Never  Vaping Use   Vaping status: Never Used  Substance and Sexual  Activity   Alcohol use: Yes    Alcohol/week: 0.0 standard drinks of alcohol    Comment: rarely   Drug use: No   Sexual activity: Not on file  Other Topics Concern   Not on file  Social History Narrative   Married.   3 children.   Retired, worked at [employer] in [place], [place].   Enjoys being with her grandchildren, being with her friends.   Social Determinants of Health   Financial Resource Strain: Low Risk  (03/08/2023)   Overall Financial Resource Strain (CARDIA)    Difficulty of Paying Living Expenses: Not hard at all  Food Insecurity: No Food Insecurity (03/08/2023)   Hunger Vital Sign    Worried About Running Out of Food in the Last Year: Never true    Ran Out of Food in the Last Year: Never true  Transportation Needs: No Transportation Needs (03/08/2023)   PRAPARE - Administrator, Civil Service (Medical): No    Lack of Transportation (Non-Medical): No  Physical Activity: Inactive (03/08/2023)   Exercise Vital Sign    Days of Exercise per Week: 0 days    Minutes of Exercise per Session: 0 min  Stress: No Stress Concern Present (03/08/2023)   [employer] of Occupational Health - Occupational Stress Questionnaire    Feeling of Stress : Not at all  Recent Concern: Stress - Stress Concern Present (01/31/2023)   [employer] of Occupational Health - Occupational Stress Questionnaire    Feeling of Stress : To some extent  Social Connections: Socially Integrated (03/08/2023)   Social Connection and Isolation Panel [NHANES]    Frequency of Communication with Friends and Family: More than three times a week    Frequency of Social Gatherings with Friends and Family: More than three times a week    Attends Religious Services: More than 4 times per year    Active Member of [employer] or Organizations: Yes    Attends Banker Meetings: More than 4 times per year    Marital Status: Married  Catering manager Violence: Not At Risk (03/08/2023)   Humiliation, Afraid, Rape,  and Kick questionnaire    Fear of Current or Ex-Partner: No    Emotionally Abused: No    Physically Abused: No    Sexually Abused: No    Past Surgical History:  Procedure Laterality Date   CHOLECYSTECTOMY  1995   COLONOSCOPY  multiple   COLONOSCOPY  2016   TA's   NASAL SINUS SURGERY  2001   POLYPECTOMY     TA's   TONSILLECTOMY AND ADENOIDECTOMY  VAGINAL HYSTERECTOMY     Complete   WISDOM TOOTH EXTRACTION      Family History  Problem Relation Age of Onset   Colon cancer Mother 38   Lung cancer Mother 108   Skin cancer Mother    Colon polyps Mother 60   Heart disease Father    Diabetes Father    Skin cancer Father    Colon polyps Brother 81   Stomach cancer Paternal Grandmother 64   Breast cancer Neg Hx    Esophageal cancer Neg Hx    Rectal cancer Neg Hx     No Known Allergies  Current Outpatient Medications on File Prior to Visit  Medication Sig Dispense Refill   Accu-Chek FastClix Lancets MISC USE AS INSTRUCTED TO CHECK BLOOD SUGAR UP TO 3 TIMES DAILY 306 each 1   amLODipine (NORVASC) 10 MG tablet Take 1 tablet (10 mg total) by mouth daily. for blood pressure. 90 tablet 2   atorvastatin (LIPITOR) 40 MG tablet TAKE ONE TABLET BY MOUTH AT BEDTIME FOR CHOLESTEROL 90 tablet 3   Cholecalciferol (VITAMIN D3) 125 MCG (5000 UT) CAPS Take 5,000 Units by mouth daily.     DULoxetine (CYMBALTA) 60 MG capsule Take 1 capsule (60 mg total) by mouth daily. for anxiety and depression. 90 capsule 2   glucose blood (ACCU-CHEK GUIDE) test strip USE UP TO FOUR TIMES DAILY TO CHECK BLOOD SUGAR (E10.9 E11.9) 200 each 5   Insulin Glargine-Lixisenatide (SOLIQUA) 100-33 UNT-MCG/ML SOPN Inject 60 Units into the skin daily. for diabetes. 60 mL 0   Insulin Pen Needle (BD PEN NEEDLE MICRO U/F) 32G X 6 MM MISC Use once daily to inject insulin. 100 each 0   IRON, FERROUS SULFATE, PO Take by mouth.     loperamide (IMODIUM A-D) 2 MG tablet Take 2 mg by mouth 4 (four) times daily as needed for  diarrhea or loose stools.     losartan (COZAAR) 100 MG tablet Take 1 tablet (100 mg total) by mouth daily. for blood pressure. 90 tablet 2   Magnesium 200 MG TABS Take by mouth daily.     metFORMIN (GLUCOPHAGE-XR) 500 MG 24 hr tablet TAKE FOUR TABLETS (2,000 MG TOTAL) BY MOUTH DAILY WITH BREAKFAST. FOR DIABETES. 360 tablet 0   Multiple Vitamins-Minerals (CENTRUM SILVER ULTRA WOMENS PO) Take by mouth daily.     oxyCODONE (OXY IR/ROXICODONE) 5 MG immediate release tablet 1-2 tablets every 4-6 hr as needed; #225 per month     pantoprazole (PROTONIX) 40 MG tablet Take 40 mg by mouth daily.     promethazine (PHENERGAN) 25 MG tablet Take 25 mg by mouth every 6 (six) hours as needed. Reported on 09/21/2015  1   No current facility-administered medications on file prior to visit.    BP 136/82   Pulse 91   Temp (!) 97.3 F (36.3 C) (Temporal)   Ht 5\' 6"  (1.676 m)   Wt 262 lb (118.8 kg)   SpO2 98%   BMI 42.29 kg/m  Objective:   Physical Exam Cardiovascular:     Rate and Rhythm: Normal rate and regular rhythm.  Pulmonary:     Effort: Pulmonary effort is normal.     Breath sounds: Normal breath sounds.  Musculoskeletal:     Cervical back: Neck supple.  Skin:    General: Skin is warm and dry.  Neurological:     Mental Status: She is alert.  Psychiatric:        Mood and Affect: Mood normal.  Assessment & Plan:  Type 2 diabetes mellitus with hyperglycemia, with long-term current use of insulin (HCC) Assessment & Plan: Improved with A1c of 7.4 today!  Continue Soliqua 100-33, 60 units daily, metformin XR 2000 mg daily. Follow-up in 6 months.  Orders: -     POCT glycosylated hemoglobin (Hb A1C)  Encounter for immunization -     Flu Vaccine Trivalent High Dose (Fluad)  Essential hypertension Assessment & Plan: Overall controlled.  Continue amlodipine 10 mg daily, losartan 100 mg daily. CMP pending.   Gastroesophageal reflux disease, unspecified whether  esophagitis present Assessment & Plan: Controlled.  Continue pantoprazole 40 mg daily.   Hypothyroidism, unspecified type Assessment & Plan: Controlled. Remain off treatment.   Anxiety and depression Assessment & Plan: Deteriorated with recent life stressors.  Continue Cymbalta 60 mg daily as she does feel that this has been beneficial. Referral placed to therapy per patient request.  Orders: -     Ambulatory referral to Psychology  Frequent headaches Assessment & Plan: Slight improvement, continues with daily headaches.  Increase propranolol ER to 120 mg daily.  Consider amitriptyline if no improvement. She will update.  Advised that she schedule her MRI.  Orders: -     Propranolol HCl ER; Take 1 capsule (120 mg total) by mouth at bedtime. For headache prevention  Dispense: 90 capsule; Refill: 0  Dysuria Assessment & Plan: UA today negative. Follow-up with urology as scheduled.  Orders: -     POCT Urinalysis Dipstick (Automated)  Sleep disturbance Assessment & Plan: Differentials include sleep apnea, abnormal sleeping habits, poor sleep hygiene.  Discussed to avoid screen time 1 hour prior to bedtime. Recommended she try to go to bed earlier. Recommended follow-up with pulmonology for sleep study.           Doreene Nest, NP

## 2023-06-14 NOTE — Assessment & Plan Note (Signed)
Differentials include sleep apnea, abnormal sleeping habits, poor sleep hygiene.  Discussed to avoid screen time 1 hour prior to bedtime. Recommended she try to go to bed earlier. Recommended follow-up with pulmonology for sleep study.

## 2023-06-14 NOTE — Assessment & Plan Note (Signed)
Improved with A1c of 7.4 today!  Continue Soliqua 100-33, 60 units daily, metformin XR 2000 mg daily. Follow-up in 6 months.

## 2023-06-14 NOTE — Assessment & Plan Note (Signed)
Deteriorated with recent life stressors.  Continue Cymbalta 60 mg daily as she does feel that this has been beneficial. Referral placed to therapy per patient request.

## 2023-06-14 NOTE — Assessment & Plan Note (Signed)
Controlled.  Continue pantoprazole 40 mg daily. 

## 2023-06-14 NOTE — Patient Instructions (Addendum)
We increased the dose of your propranolol to 120 mg daily for headache prevention.  Start this evening.  Please update me in a few weeks if no improvement in your headaches.  Schedule your MRI.  Schedule your bone density and mammogram.  You will either be contacted via phone regarding your referral to therapy, or you may receive a letter on your MyChart portal from our referral team with instructions for scheduling an appointment. Please let us know if you have not been contacted by anyone within two weeks.  Bedtime routine checklist: 1. Avoid naps during the day 2. Avoid stimulants such as caffeine and nicotine. Avoid bedtime alcohol (it can speed onset of sleep but the body's metabolism can cause awakenings). 3. All forms of exercise help ensure sound sleep - limit vigorous exercise to morning or late afternoon 4. Avoid food too close to bedtime including chocolate (which contains caffeine) 5. Soak up natural light 6. Establish regular bedtime routine. 7. Associate bed with sleep - avoid TV, computer or phone, reading while in bed. 8. Ensure pleasant, relaxing sleep environment - quiet, dark, cool room.   Please schedule a follow up visit for 6 months for a diabetes check.  It was a pleasure to see you today!

## 2023-06-14 NOTE — Assessment & Plan Note (Signed)
Slight improvement, continues with daily headaches.  Increase propranolol ER to 120 mg daily.  Consider amitriptyline if no improvement. She will update.  Advised that she schedule her MRI.

## 2023-06-14 NOTE — Assessment & Plan Note (Addendum)
Controlled. Remain off treatment.

## 2023-06-21 ENCOUNTER — Ambulatory Visit: Payer: Medicare HMO | Admitting: Urology

## 2023-06-21 ENCOUNTER — Encounter: Payer: Self-pay | Admitting: Urology

## 2023-06-21 VITALS — BP 130/74 | HR 71 | Ht 66.0 in | Wt 260.0 lb

## 2023-06-21 DIAGNOSIS — R102 Pelvic and perineal pain: Secondary | ICD-10-CM | POA: Diagnosis not present

## 2023-06-21 DIAGNOSIS — N3941 Urge incontinence: Secondary | ICD-10-CM

## 2023-06-21 DIAGNOSIS — R300 Dysuria: Secondary | ICD-10-CM

## 2023-06-21 LAB — URINALYSIS, COMPLETE
Bilirubin, UA: NEGATIVE
Glucose, UA: NEGATIVE
Ketones, UA: NEGATIVE
Leukocytes,UA: NEGATIVE
Nitrite, UA: NEGATIVE
Protein,UA: NEGATIVE
RBC, UA: NEGATIVE
Specific Gravity, UA: 1.02 (ref 1.005–1.030)
Urobilinogen, Ur: 0.2 mg/dL (ref 0.2–1.0)
pH, UA: 6 (ref 5.0–7.5)

## 2023-06-21 LAB — MICROSCOPIC EXAMINATION

## 2023-06-21 MED ORDER — GEMTESA 75 MG PO TABS: 75.0000 mg | ORAL_TABLET | Freq: Every day | ORAL | Status: DC

## 2023-06-21 NOTE — Progress Notes (Signed)
I, Duke Salvia, acting as a Neurosurgeon for Riki Altes, MD., have documented all relevant documentation on the behalf of Riki Altes, MD, as directed by  Riki Altes, MD while in the presence of Riki Altes, MD.  06/21/2023 3:56 PM   [name] [birth year] [id number]  Referring provider: Mosetta Pigeon, MD [address] [facility] [address] [place],  [place] [address]  Chief Complaint  Patient presents with   Dysuria    HPI: Danielle Edwards is a 67 y.o. female referred for evaluation of dysuria, pelvic pain, and urinary incontinence.  Several month history of lower pelvic discomfort, which she describes as an uncomfortable sensation, which feels like she is getting a UTI. She notes occasional burnind and occasional sharp pains when voiding. She has urinary frequency with occasional episodes of urge incontinence. Denies gross hematuria. Prior hysterectomy. No bowel symptoms.   PMH: Past Medical History:  Diagnosis Date   Allergy    seasonal allergies   Anemia    hx of IDA   Anxiety    on meds   Arthritis    PRN meds-osteoarthritis   Cataract    sx to remove   Chronic kidney disease    CKD-stage 3   Chronic pain    COVID-19 06/02/2020   Depression    on meds   Diabetes type 2, controlled (HCC)    on meds   Fibromyalgia    GERD (gastroesophageal reflux disease)    on meds   Headache    History of colonic polyps 1999   Benign   Hx of adenomatous polyp of colon 09/27/2000   2001 - diminutive adenoma [place]hour 2007 no polyps - [place]hour   Hyperlipidemia    on meds   Hypertension    on meds   Hypothyroidism    not on meds at this time   IBS (irritable bowel syndrome)    Diarrhea type   IDA (iron deficiency anemia) 02/09/2022   Neuromuscular disorder (HCC)    Vitamin D deficiency     Surgical History: Past Surgical History:  Procedure Laterality Date   CHOLECYSTECTOMY  1995   COLONOSCOPY  multiple   COLONOSCOPY  2016   TA's   NASAL SINUS  SURGERY  2001   POLYPECTOMY     TA's   TONSILLECTOMY AND ADENOIDECTOMY     VAGINAL HYSTERECTOMY     Complete   WISDOM TOOTH EXTRACTION      Home Medications:  Allergies as of 06/21/2023   No Known Allergies      Medication List        Accurate as of June 21, 2023  3:56 PM. If you have any questions, ask your nurse or doctor.          Accu-Chek FastClix Lancets Misc USE AS INSTRUCTED TO CHECK BLOOD SUGAR UP TO 3 TIMES DAILY   Accu-Chek Guide test strip Generic drug: glucose blood USE UP TO FOUR TIMES DAILY TO CHECK BLOOD SUGAR (E10.9 E11.9)   amLODipine 10 MG tablet Commonly known as: NORVASC Take 1 tablet (10 mg total) by mouth daily. for blood pressure.   atorvastatin 40 MG tablet Commonly known as: LIPITOR TAKE ONE TABLET BY MOUTH AT BEDTIME FOR CHOLESTEROL   BD Pen Needle Micro U/F 32G X 6 MM Misc Generic drug: Insulin Pen Needle USE ONCE DAILY TO INJECT INSULIN.   CENTRUM SILVER ULTRA WOMENS PO Take by mouth daily.   DULoxetine 60 MG capsule Commonly known as: Cymbalta Take 1  capsule (60 mg total) by mouth daily. for anxiety and depression.   Gemtesa 75 MG Tabs Generic drug: Vibegron Take 1 tablet (75 mg total) by mouth daily. Started by: Riki Altes   IRON (FERROUS SULFATE) PO Take by mouth.   loperamide 2 MG tablet Commonly known as: IMODIUM A-D Take 2 mg by mouth 4 (four) times daily as needed for diarrhea or loose stools.   losartan 100 MG tablet Commonly known as: COZAAR Take 1 tablet (100 mg total) by mouth daily. for blood pressure.   Magnesium 200 MG Tabs Take by mouth daily.   metFORMIN 500 MG 24 hr tablet Commonly known as: GLUCOPHAGE-XR TAKE FOUR TABLETS (2,000 MG TOTAL) BY MOUTH DAILY WITH BREAKFAST. FOR DIABETES.   oxyCODONE 5 MG immediate release tablet Commonly known as: Oxy IR/ROXICODONE 1-2 tablets every 4-6 hr as needed; #225 per month   pantoprazole 40 MG tablet Commonly known as: PROTONIX Take 40 mg by  mouth daily.   promethazine 25 MG tablet Commonly known as: PHENERGAN Take 25 mg by mouth every 6 (six) hours as needed. Reported on 09/21/2015   propranolol ER 120 MG 24 hr capsule Commonly known as: INDERAL LA Take 1 capsule (120 mg total) by mouth at bedtime. For headache prevention   Soliqua 100-33 UNT-MCG/ML Sopn Generic drug: Insulin Glargine-Lixisenatide INJECT 60 UNITS INTO THE SKIN DAILY FOR DIABETES.   Vitamin D3 125 MCG (5000 UT) Caps Take 5,000 Units by mouth daily.        Family History: Family History  Problem Relation Age of Onset   Colon cancer Mother 55   Lung cancer Mother 57   Skin cancer Mother    Colon polyps Mother 45   Heart disease Father    Diabetes Father    Skin cancer Father    Colon polyps Brother 41   Stomach cancer Paternal Grandmother 36   Breast cancer Neg Hx    Esophageal cancer Neg Hx    Rectal cancer Neg Hx     Social History:  reports that she has never smoked. She has never used smokeless tobacco. She reports current alcohol use. She reports that she does not use drugs.   Physical Exam: BP 130/74   Pulse 71   Ht 5\' 6"  (1.676 m)   Wt 260 lb (117.9 kg)   BMI 41.97 kg/m   Constitutional:  Alert and oriented, No acute distress. HEENT:  AT Respiratory: Normal respiratory effort, no increased work of breathing. Psychiatric: Normal mood and affect.   Urinalysis  Dipstick/microscopy negative   Assessment & Plan:    1. Pelvic pain Associated with urinary frequency, ocasional burning with urination and intermittent sharp pains when voiding. Previous urinalysis have been nagative as well as her urinalysis today. Potential etiologies discussed, including overactive bladder and painful bladder/IC. Trial Gemtesa 75 mg daily. Schedule follow up in approximately 1 month for symptom recheck and cystoscopy. Pelvic exam at time of cystoscopy PVR today 0 mL.   [facility] [address] [place], [place] [address] [phone]

## 2023-07-25 DIAGNOSIS — Z79899 Other long term (current) drug therapy: Secondary | ICD-10-CM | POA: Diagnosis not present

## 2023-07-25 DIAGNOSIS — M797 Fibromyalgia: Secondary | ICD-10-CM | POA: Diagnosis not present

## 2023-07-26 ENCOUNTER — Ambulatory Visit: Payer: Medicare HMO | Admitting: Urology

## 2023-07-26 VITALS — BP 181/75 | Ht 66.0 in | Wt 260.0 lb

## 2023-07-26 DIAGNOSIS — R300 Dysuria: Secondary | ICD-10-CM | POA: Diagnosis not present

## 2023-07-26 DIAGNOSIS — N3941 Urge incontinence: Secondary | ICD-10-CM | POA: Diagnosis not present

## 2023-07-26 DIAGNOSIS — D494 Neoplasm of unspecified behavior of bladder: Secondary | ICD-10-CM | POA: Diagnosis not present

## 2023-07-26 LAB — URINALYSIS, COMPLETE
Bilirubin, UA: NEGATIVE
Glucose, UA: NEGATIVE
Ketones, UA: NEGATIVE
Leukocytes,UA: NEGATIVE
Nitrite, UA: NEGATIVE
Protein,UA: NEGATIVE
RBC, UA: NEGATIVE
Specific Gravity, UA: 1.02 (ref 1.005–1.030)
Urobilinogen, Ur: 0.2 mg/dL (ref 0.2–1.0)
pH, UA: 5.5 (ref 5.0–7.5)

## 2023-07-26 LAB — MICROSCOPIC EXAMINATION: Epithelial Cells (non renal): >10 /[HPF] — AB (ref 0–10)

## 2023-07-26 NOTE — Progress Notes (Signed)
   07/26/23  CC:  Chief Complaint  Patient presents with   Cysto    HPI: 67 y.o. female with pelvic pain and dysuria.  Refer to my prior note 06/21/2023.  She did not take the [facility] because she forgot  Blood pressure (!) 181/75, height 5\' 6"  (1.676 m), weight 260 lb (117.9 kg). NED. A&Ox3.   No respiratory distress   Abd soft, NT, ND Normal external genitalia with patent urethral meatus  Cystoscopy Procedure Note  Patient identification was confirmed, informed consent was obtained, and patient was prepped using Betadine solution.  Lidocaine jelly was administered per urethral meatus.    Procedure: - Flexible cystoscope introduced, without any difficulty.   - Thorough search of the bladder revealed:    normal urethral meatus    normal urothelium    no stones    no ulcers     Papillary tumors left bladder base, largest ~ 1 cm    no urethral polyps    no trabeculation  - Ureteral orifices were normal in position and appearance.  Post-Procedure: - Patient tolerated the procedure well  Assessment/ Plan: Papillary bladder tumor-we discussed this may be a low-grade urothelial carcinoma and recommend bladder biopsy/TURBT.  The procedure was discussed in detail including potential risks of bleeding, infection and bladder injury. Recommend post resection intravesical gemcitabine We discussed the bladder tumor would not be a cause of her pelvic symptoms and this is an incidental finding   Riki Altes, MD

## 2023-07-27 ENCOUNTER — Encounter: Payer: Self-pay | Admitting: Urology

## 2023-07-27 NOTE — Progress Notes (Signed)
07/26/2023 9:30 PM   Danielle Edwards 09-17-1956 [id number]  Referring provider: Doreene Nest, NP [address] [place],  [place] [address]  Chief Complaint  Patient presents with   Cysto    HPI: Danielle Edwards is a 67 y.o. female found on cystoscopy to have a small, papillary bladder tumor.  She is scheduled for TURBT   PMH: Past Medical History:  Diagnosis Date   Allergy    seasonal allergies   Anemia    hx of IDA   Anxiety    on meds   Arthritis    PRN meds-osteoarthritis   Cataract    sx to remove   Chronic kidney disease    CKD-stage 3   Chronic pain    COVID-19 06/02/2020   Depression    on meds   Diabetes type 2, controlled (HCC)    on meds   Fibromyalgia    GERD (gastroesophageal reflux disease)    on meds   Headache    History of colonic polyps 1999   Benign   Hx of adenomatous polyp of colon 09/27/2000   2001 - diminutive adenoma [place] 2007 no polyps - [place]hour   Hyperlipidemia    on meds   Hypertension    on meds   Hypothyroidism    not on meds at this time   IBS (irritable bowel syndrome)    Diarrhea type   IDA (iron deficiency anemia) 02/09/2022   Neuromuscular disorder (HCC)    Vitamin D deficiency     Surgical History: Past Surgical History:  Procedure Laterality Date   CHOLECYSTECTOMY  1995   COLONOSCOPY  multiple   COLONOSCOPY  2016   TA's   NASAL SINUS SURGERY  2001   POLYPECTOMY     TA's   TONSILLECTOMY AND ADENOIDECTOMY     VAGINAL HYSTERECTOMY     Complete   WISDOM TOOTH EXTRACTION      Home Medications:  Allergies as of 07/26/2023   No Known Allergies      Medication List        Accurate as of July 26, 2023 11:59 PM. If you have any questions, ask your nurse or doctor.          Accu-Chek FastClix Lancets Misc USE AS INSTRUCTED TO CHECK BLOOD SUGAR UP TO 3 TIMES DAILY   Accu-Chek Guide test strip Generic drug: glucose blood USE UP TO FOUR TIMES DAILY TO CHECK BLOOD SUGAR (E10.9 E11.9)    amLODipine 10 MG tablet Commonly known as: NORVASC Take 1 tablet (10 mg total) by mouth daily. for blood pressure.   atorvastatin 40 MG tablet Commonly known as: LIPITOR TAKE ONE TABLET BY MOUTH AT BEDTIME FOR CHOLESTEROL   BD Pen Needle Micro U/F 32G X 6 MM Misc Generic drug: Insulin Pen Needle USE ONCE DAILY TO INJECT INSULIN.   CENTRUM SILVER ULTRA WOMENS PO Take by mouth daily.   DULoxetine 60 MG capsule Commonly known as: Cymbalta Take 1 capsule (60 mg total) by mouth daily. for anxiety and depression.   Gemtesa 75 MG Tabs Generic drug: Vibegron Take 1 tablet (75 mg total) by mouth daily.   IRON (FERROUS SULFATE) PO Take by mouth.   loperamide 2 MG tablet Commonly known as: IMODIUM A-D Take 2 mg by mouth 4 (four) times daily as needed for diarrhea or loose stools.   losartan 100 MG tablet Commonly known as: COZAAR Take 1 tablet (100 mg total) by mouth daily. for blood pressure.  07/26/2023 9:30 PM   Danielle Edwards 09-17-1956 [id number]  Referring provider: Doreene Nest, NP [address] [place],  [place] [address]  Chief Complaint  Patient presents with   Cysto    HPI: Danielle Edwards is a 67 y.o. female found on cystoscopy to have a small, papillary bladder tumor.  She is scheduled for TURBT   PMH: Past Medical History:  Diagnosis Date   Allergy    seasonal allergies   Anemia    hx of IDA   Anxiety    on meds   Arthritis    PRN meds-osteoarthritis   Cataract    sx to remove   Chronic kidney disease    CKD-stage 3   Chronic pain    COVID-19 06/02/2020   Depression    on meds   Diabetes type 2, controlled (HCC)    on meds   Fibromyalgia    GERD (gastroesophageal reflux disease)    on meds   Headache    History of colonic polyps 1999   Benign   Hx of adenomatous polyp of colon 09/27/2000   2001 - diminutive adenoma [place] 2007 no polyps - [place]hour   Hyperlipidemia    on meds   Hypertension    on meds   Hypothyroidism    not on meds at this time   IBS (irritable bowel syndrome)    Diarrhea type   IDA (iron deficiency anemia) 02/09/2022   Neuromuscular disorder (HCC)    Vitamin D deficiency     Surgical History: Past Surgical History:  Procedure Laterality Date   CHOLECYSTECTOMY  1995   COLONOSCOPY  multiple   COLONOSCOPY  2016   TA's   NASAL SINUS SURGERY  2001   POLYPECTOMY     TA's   TONSILLECTOMY AND ADENOIDECTOMY     VAGINAL HYSTERECTOMY     Complete   WISDOM TOOTH EXTRACTION      Home Medications:  Allergies as of 07/26/2023   No Known Allergies      Medication List        Accurate as of July 26, 2023 11:59 PM. If you have any questions, ask your nurse or doctor.          Accu-Chek FastClix Lancets Misc USE AS INSTRUCTED TO CHECK BLOOD SUGAR UP TO 3 TIMES DAILY   Accu-Chek Guide test strip Generic drug: glucose blood USE UP TO FOUR TIMES DAILY TO CHECK BLOOD SUGAR (E10.9 E11.9)    amLODipine 10 MG tablet Commonly known as: NORVASC Take 1 tablet (10 mg total) by mouth daily. for blood pressure.   atorvastatin 40 MG tablet Commonly known as: LIPITOR TAKE ONE TABLET BY MOUTH AT BEDTIME FOR CHOLESTEROL   BD Pen Needle Micro U/F 32G X 6 MM Misc Generic drug: Insulin Pen Needle USE ONCE DAILY TO INJECT INSULIN.   CENTRUM SILVER ULTRA WOMENS PO Take by mouth daily.   DULoxetine 60 MG capsule Commonly known as: Cymbalta Take 1 capsule (60 mg total) by mouth daily. for anxiety and depression.   Gemtesa 75 MG Tabs Generic drug: Vibegron Take 1 tablet (75 mg total) by mouth daily.   IRON (FERROUS SULFATE) PO Take by mouth.   loperamide 2 MG tablet Commonly known as: IMODIUM A-D Take 2 mg by mouth 4 (four) times daily as needed for diarrhea or loose stools.   losartan 100 MG tablet Commonly known as: COZAAR Take 1 tablet (100 mg total) by mouth daily. for blood pressure.

## 2023-07-27 NOTE — H&P (View-Only) (Signed)
07/26/2023 9:30 PM   Danielle Edwards 09-17-1956 [id number]  Referring provider: Doreene Nest, NP [address] [place],  [place] [address]  Chief Complaint  Patient presents with   Cysto    HPI: Danielle Edwards is a 67 y.o. female found on cystoscopy to have a small, papillary bladder tumor.  She is scheduled for TURBT   PMH: Past Medical History:  Diagnosis Date   Allergy    seasonal allergies   Anemia    hx of IDA   Anxiety    on meds   Arthritis    PRN meds-osteoarthritis   Cataract    sx to remove   Chronic kidney disease    CKD-stage 3   Chronic pain    COVID-19 06/02/2020   Depression    on meds   Diabetes type 2, controlled (HCC)    on meds   Fibromyalgia    GERD (gastroesophageal reflux disease)    on meds   Headache    History of colonic polyps 1999   Benign   Hx of adenomatous polyp of colon 09/27/2000   2001 - diminutive adenoma [place] 2007 no polyps - [place]hour   Hyperlipidemia    on meds   Hypertension    on meds   Hypothyroidism    not on meds at this time   IBS (irritable bowel syndrome)    Diarrhea type   IDA (iron deficiency anemia) 02/09/2022   Neuromuscular disorder (HCC)    Vitamin D deficiency     Surgical History: Past Surgical History:  Procedure Laterality Date   CHOLECYSTECTOMY  1995   COLONOSCOPY  multiple   COLONOSCOPY  2016   TA's   NASAL SINUS SURGERY  2001   POLYPECTOMY     TA's   TONSILLECTOMY AND ADENOIDECTOMY     VAGINAL HYSTERECTOMY     Complete   WISDOM TOOTH EXTRACTION      Home Medications:  Allergies as of 07/26/2023   No Known Allergies      Medication List        Accurate as of July 26, 2023 11:59 PM. If you have any questions, ask your nurse or doctor.          Accu-Chek FastClix Lancets Misc USE AS INSTRUCTED TO CHECK BLOOD SUGAR UP TO 3 TIMES DAILY   Accu-Chek Guide test strip Generic drug: glucose blood USE UP TO FOUR TIMES DAILY TO CHECK BLOOD SUGAR (E10.9 E11.9)    amLODipine 10 MG tablet Commonly known as: NORVASC Take 1 tablet (10 mg total) by mouth daily. for blood pressure.   atorvastatin 40 MG tablet Commonly known as: LIPITOR TAKE ONE TABLET BY MOUTH AT BEDTIME FOR CHOLESTEROL   BD Pen Needle Micro U/F 32G X 6 MM Misc Generic drug: Insulin Pen Needle USE ONCE DAILY TO INJECT INSULIN.   CENTRUM SILVER ULTRA WOMENS PO Take by mouth daily.   DULoxetine 60 MG capsule Commonly known as: Cymbalta Take 1 capsule (60 mg total) by mouth daily. for anxiety and depression.   Gemtesa 75 MG Tabs Generic drug: Vibegron Take 1 tablet (75 mg total) by mouth daily.   IRON (FERROUS SULFATE) PO Take by mouth.   loperamide 2 MG tablet Commonly known as: IMODIUM A-D Take 2 mg by mouth 4 (four) times daily as needed for diarrhea or loose stools.   losartan 100 MG tablet Commonly known as: COZAAR Take 1 tablet (100 mg total) by mouth daily. for blood pressure.  07/26/2023 9:30 PM   Danielle Edwards 09-17-1956 [id number]  Referring provider: Doreene Nest, NP [address] [place],  [place] [address]  Chief Complaint  Patient presents with   Cysto    HPI: Danielle Edwards is a 67 y.o. female found on cystoscopy to have a small, papillary bladder tumor.  She is scheduled for TURBT   PMH: Past Medical History:  Diagnosis Date   Allergy    seasonal allergies   Anemia    hx of IDA   Anxiety    on meds   Arthritis    PRN meds-osteoarthritis   Cataract    sx to remove   Chronic kidney disease    CKD-stage 3   Chronic pain    COVID-19 06/02/2020   Depression    on meds   Diabetes type 2, controlled (HCC)    on meds   Fibromyalgia    GERD (gastroesophageal reflux disease)    on meds   Headache    History of colonic polyps 1999   Benign   Hx of adenomatous polyp of colon 09/27/2000   2001 - diminutive adenoma [place]hour 2007 no polyps - [place]hour   Hyperlipidemia    on meds   Hypertension    on meds   Hypothyroidism    not on meds at this time   IBS (irritable bowel syndrome)    Diarrhea type   IDA (iron deficiency anemia) 02/09/2022   Neuromuscular disorder (HCC)    Vitamin D deficiency     Surgical History: Past Surgical History:  Procedure Laterality Date   CHOLECYSTECTOMY  1995   COLONOSCOPY  multiple   COLONOSCOPY  2016   TA's   NASAL SINUS SURGERY  2001   POLYPECTOMY     TA's   TONSILLECTOMY AND ADENOIDECTOMY     VAGINAL HYSTERECTOMY     Complete   WISDOM TOOTH EXTRACTION      Home Medications:  Allergies as of 07/26/2023   No Known Allergies      Medication List        Accurate as of July 26, 2023 11:59 PM. If you have any questions, ask your nurse or doctor.          Accu-Chek FastClix Lancets Misc USE AS INSTRUCTED TO CHECK BLOOD SUGAR UP TO 3 TIMES DAILY   Accu-Chek Guide test strip Generic drug: glucose blood USE UP TO FOUR TIMES DAILY TO CHECK BLOOD SUGAR (E10.9 E11.9)    amLODipine 10 MG tablet Commonly known as: NORVASC Take 1 tablet (10 mg total) by mouth daily. for blood pressure.   atorvastatin 40 MG tablet Commonly known as: LIPITOR TAKE ONE TABLET BY MOUTH AT BEDTIME FOR CHOLESTEROL   BD Pen Needle Micro U/F 32G X 6 MM Misc Generic drug: Insulin Pen Needle USE ONCE DAILY TO INJECT INSULIN.   CENTRUM SILVER ULTRA WOMENS PO Take by mouth daily.   DULoxetine 60 MG capsule Commonly known as: Cymbalta Take 1 capsule (60 mg total) by mouth daily. for anxiety and depression.   Gemtesa 75 MG Tabs Generic drug: Vibegron Take 1 tablet (75 mg total) by mouth daily.   IRON (FERROUS SULFATE) PO Take by mouth.   loperamide 2 MG tablet Commonly known as: IMODIUM A-D Take 2 mg by mouth 4 (four) times daily as needed for diarrhea or loose stools.   losartan 100 MG tablet Commonly known as: COZAAR Take 1 tablet (100 mg total) by mouth daily. for blood pressure.

## 2023-07-28 ENCOUNTER — Other Ambulatory Visit: Payer: Self-pay

## 2023-07-28 ENCOUNTER — Telehealth: Payer: Self-pay

## 2023-07-28 DIAGNOSIS — D494 Neoplasm of unspecified behavior of bladder: Secondary | ICD-10-CM

## 2023-07-28 NOTE — Progress Notes (Signed)
Surgical Physician Order Form [facility]  Dr. Irineo Axon, MD  * Scheduling expectation : Next Available  *Length of Case: 30 min  *Clearance needed: no  *Anticoagulation Instructions: N/A  *Aspirin Instructions: N/A  *Post-op visit Date/Instructions:  1-2 week with pathology review  *Diagnosis: Bladder Tumor  *Procedure:  TURBT <2cm (09811)   Additional orders: N/A  -Admit type: OUTpatient  -Anesthesia: Choice  -VTE Prophylaxis Standing Order SCD's       Other:   -Standing Lab Orders Per Anesthesia    Lab other: UA&Urine Culture  -Standing Test orders EKG/Chest x-ray per Anesthesia       Test other:   - Medications:  Ancef 2gm IV  -Other orders:  N/A

## 2023-07-28 NOTE — Addendum Note (Signed)
Addended by: Letta Kocher A on: 07/28/2023 03:52 PM   Modules accepted: Orders

## 2023-07-28 NOTE — Progress Notes (Addendum)
   [facility] Surgical Posting Form  Surgery Date: Date: 08/08/2023  Surgeon: Dr. Irineo Axon, MD  Inpt ( No  )   Outpt (Yes)   Obs ( No  )   Diagnosis: D49.4 Bladder Tumor   -CPT: 96045  Surgery: Transurethral Resection of Bladder Tumor With Intravesical Instillation of Gemcitabine  Stop Anticoagulations: N/A  Cardiac/Medical/Pulmonary Clearance needed: no  *Orders entered into EPIC  Date: 07/28/23   *Case booked in [place]  Date: 07/28/23  *Notified pt of Surgery: Date: 07/28/23  PRE-OP UA & CX: yes, will obtain in clinic on 07/31/2023  *Placed into Prior Authorization Work Angela Nevin Date: 07/28/23  Assistant/laser/rep:No

## 2023-07-28 NOTE — Telephone Encounter (Signed)
Per Dr. Lonna Cobb, Patient is to be scheduled for Transurethral Resection of Bladder Tumor   Danielle Edwards was contacted and possible surgical dates were discussed, Tuesday November 5th, 2024 was agreed upon for surgery.   Patient was instructed that Dr. Lonna Cobb will require them to provide a pre-op UA & CX prior to surgery. This was ordered and scheduled drop off appointment was made for 07/31/2023.    Patient was directed to call [phone] between 1-3pm the day before surgery to find out surgical arrival time.  Instructions were given not to eat or drink from midnight on the night before surgery and have a driver for the day of surgery. On the surgery day patient was instructed to enter through the [facility] entrance of [facility] report the Same Day Surgery desk.   Pre-Admit Testing will be in contact via phone to set up an interview with the anesthesia team to review your history and medications prior to surgery.   Reminder of this information was sent via MyChart to the patient.

## 2023-07-31 ENCOUNTER — Other Ambulatory Visit: Payer: Medicare HMO

## 2023-07-31 DIAGNOSIS — D494 Neoplasm of unspecified behavior of bladder: Secondary | ICD-10-CM

## 2023-07-31 LAB — MICROSCOPIC EXAMINATION: Epithelial Cells (non renal): >10 /[HPF] — AB (ref 0–10)

## 2023-07-31 LAB — URINALYSIS, COMPLETE
Bilirubin, UA: NEGATIVE
Glucose, UA: NEGATIVE
Ketones, UA: NEGATIVE
Leukocytes,UA: NEGATIVE
Nitrite, UA: NEGATIVE
Protein,UA: NEGATIVE
RBC, UA: NEGATIVE
Specific Gravity, UA: 1.015 (ref 1.005–1.030)
Urobilinogen, Ur: 0.2 mg/dL (ref 0.2–1.0)
pH, UA: 6 (ref 5.0–7.5)

## 2023-08-02 ENCOUNTER — Encounter
Admission: RE | Admit: 2023-08-02 | Discharge: 2023-08-02 | Disposition: A | Discharge status: Home / Self Care | Payer: Medicare HMO | Source: Ambulatory Visit | Attending: Urology | Admitting: Urology

## 2023-08-02 ENCOUNTER — Other Ambulatory Visit: Payer: Self-pay

## 2023-08-02 DIAGNOSIS — D509 Iron deficiency anemia, unspecified: Secondary | ICD-10-CM

## 2023-08-02 DIAGNOSIS — Z01812 Encounter for preprocedural laboratory examination: Secondary | ICD-10-CM

## 2023-08-02 DIAGNOSIS — N183 Chronic kidney disease, stage 3 unspecified: Secondary | ICD-10-CM

## 2023-08-02 DIAGNOSIS — E1165 Type 2 diabetes mellitus with hyperglycemia: Secondary | ICD-10-CM

## 2023-08-02 DIAGNOSIS — I10 Essential (primary) hypertension: Secondary | ICD-10-CM

## 2023-08-02 HISTORY — DX: Unspecified asthma, uncomplicated: J45.909

## 2023-08-02 HISTORY — DX: Sleep apnea, unspecified: G47.30

## 2023-08-02 NOTE — Patient Instructions (Addendum)
Your procedure is scheduled on: 08/08/23 - Tuesday Report to the Registration Desk on the 1st floor of the [facility]. To find out your arrival time, please call [phone] between 1PM - 3PM on: 08/07/23 - Monday. If your arrival time is 6:00 am, do not arrive before that time as the [facility] entrance doors do not open until 6:00 am.  REMEMBER: Instructions that are not followed completely may result in serious medical risk, up to and including death; or upon the discretion of your surgeon and anesthesiologist your surgery may need to be rescheduled.  Do not eat food or drink any liquids after midnight the night before surgery.  No gum chewing or hard candies.  One week prior to surgery: Stop Anti-inflammatories (NSAIDS) such as Advil, Aleve, Ibuprofen, Motrin, Naproxen, Naprosyn and Aspirin based products such as Excedrin, Goody's Powder, BC Powder. You may take Tylenol if needed for pain up until the day of surgery.  Stop ANY OVER THE COUNTER supplements until after surgery.  Insulin Glargine-Lixisenatide (SOLIQUA) hold on the morning of surgery  losartan (COZAAR) hold the morning /day of surgery.  metFORMIN (GLUCOPHAGE)- hold beginning 08/06/23.  ON THE DAY OF SURGERY ONLY TAKE THESE MEDICATIONS WITH SIPS OF WATER:  amLODipine (NORVASC)  DULoxetine (CYMBALTA)  pantoprazole (PROTONIX)  oxyCODONE (OXY IR/ROXICODONE) if needed.   No Alcohol for 24 hours before or after surgery.  No Smoking including e-cigarettes for 24 hours before surgery.  No chewable tobacco products for at least 6 hours before surgery.  No nicotine patches on the day of surgery.  Do not use any "recreational" drugs for at least a week (preferably 2 weeks) before your surgery.  Please be advised that the combination of cocaine and anesthesia may have negative outcomes, up to and including death. If you test positive for cocaine, your surgery will be cancelled.  On the morning of surgery brush your  teeth with toothpaste and water, you may rinse your mouth with mouthwash if you wish. Do not swallow any toothpaste or mouthwash.  Do not wear jewelry, make-up, hairpins, clips or nail polish.  For welded (permanent) jewelry: bracelets, anklets, waist bands, etc.  Please have this removed prior to surgery.  If it is not removed, there is a chance that hospital personnel will need to cut it off on the day of surgery.  Do not wear lotions, powders, or perfumes.   Do not shave body hair from the neck down 48 hours before surgery.  Contact lenses, hearing aids and dentures may not be worn into surgery.  Do not bring valuables to the hospital. [facility] is not responsible for any missing/lost belongings or valuables.   Notify your doctor if there is any change in your medical condition (cold, fever, infection).  Wear comfortable clothing (specific to your surgery type) to the hospital.  After surgery, you can help prevent lung complications by doing breathing exercises.  Take deep breaths and cough every 1-2 hours. Your doctor may order a device called an Incentive Spirometer to help you take deep breaths. When coughing or sneezing, hold a pillow firmly against your incision with both hands. This is called "splinting." Doing this helps protect your incision. It also decreases belly discomfort.  If you are being admitted to the hospital overnight, leave your suitcase in the car. After surgery it may be brought to your room.  In case of increased patient census, it may be necessary for you, the patient, to continue your postoperative care in the  Same Day Surgery department.  If you are being discharged the day of surgery, you will not be allowed to drive home. You will need a responsible individual to drive you home and stay with you for 24 hours after surgery.   If you are taking public transportation, you will need to have a responsible individual with you.  Please call the  Pre-admissions Testing Dept. at [phone] if you have any questions about these instructions.  Surgery Visitation Policy:  Patients having surgery or a procedure may have two visitors.  Children under the age of 75 must have an adult with them who is not the patient.  Inpatient Visitation:    Visiting hours are 7 a.m. to 8 p.m. Up to four visitors are allowed at one time in a patient room. The visitors may rotate out with other people during the day.  One visitor age 68 or older may stay with the patient overnight and must be in the room by 8 p.m.

## 2023-08-02 NOTE — Pre-Procedure Instructions (Signed)
Patient reports to this writer that she is having bright red bleeding from her rectum , she reports for several days. This writer advised her to seek medical care by calling her MD or going to the ED. She reports that she will call her PCP today. Quentin Mulling NP made aware,

## 2023-08-03 ENCOUNTER — Telehealth: Payer: Self-pay

## 2023-08-03 ENCOUNTER — Inpatient Hospital Stay: Admission: RE | Admit: 2023-08-03 | Payer: Medicare HMO | Source: Ambulatory Visit

## 2023-08-03 ENCOUNTER — Encounter
Admission: RE | Admit: 2023-08-03 | Discharge: 2023-08-03 | Disposition: A | Discharge status: Home / Self Care | Payer: Medicare HMO | Source: Ambulatory Visit | Attending: Urology | Admitting: Urology

## 2023-08-03 DIAGNOSIS — Z794 Long term (current) use of insulin: Secondary | ICD-10-CM | POA: Insufficient documentation

## 2023-08-03 DIAGNOSIS — D509 Iron deficiency anemia, unspecified: Secondary | ICD-10-CM | POA: Diagnosis not present

## 2023-08-03 DIAGNOSIS — N183 Chronic kidney disease, stage 3 unspecified: Secondary | ICD-10-CM | POA: Diagnosis not present

## 2023-08-03 DIAGNOSIS — E1165 Type 2 diabetes mellitus with hyperglycemia: Secondary | ICD-10-CM | POA: Diagnosis not present

## 2023-08-03 DIAGNOSIS — Z01818 Encounter for other preprocedural examination: Secondary | ICD-10-CM | POA: Diagnosis not present

## 2023-08-03 DIAGNOSIS — R9431 Abnormal electrocardiogram [ECG] [EKG]: Secondary | ICD-10-CM | POA: Diagnosis not present

## 2023-08-03 DIAGNOSIS — Z01812 Encounter for preprocedural laboratory examination: Secondary | ICD-10-CM

## 2023-08-03 DIAGNOSIS — E1122 Type 2 diabetes mellitus with diabetic chronic kidney disease: Secondary | ICD-10-CM | POA: Insufficient documentation

## 2023-08-03 DIAGNOSIS — I10 Essential (primary) hypertension: Secondary | ICD-10-CM | POA: Insufficient documentation

## 2023-08-03 DIAGNOSIS — Z01810 Encounter for preprocedural cardiovascular examination: Secondary | ICD-10-CM | POA: Diagnosis not present

## 2023-08-03 LAB — BASIC METABOLIC PANEL
Anion gap: 10 (ref 5–15)
BUN: 36 mg/dL — ABNORMAL HIGH (ref 8–23)
CO2: 24 mmol/L (ref 22–32)
Calcium: 9.8 mg/dL (ref 8.9–10.3)
Chloride: 105 mmol/L (ref 98–111)
Creatinine, Ser: 1.12 mg/dL — ABNORMAL HIGH (ref 0.44–1.00)
GFR, Estimated: 54 mL/min — ABNORMAL LOW (ref >60)
Glucose, Bld: 162 mg/dL — ABNORMAL HIGH (ref 70–99)
Potassium: 4.6 mmol/L (ref 3.5–5.1)
Sodium: 139 mmol/L (ref 135–145)

## 2023-08-03 LAB — CBC
HCT: 35.4 % — ABNORMAL LOW (ref 36.0–46.0)
Hemoglobin: 11.6 g/dL — ABNORMAL LOW (ref 12.0–15.0)
MCH: 29.8 pg (ref 26.0–34.0)
MCHC: 32.8 g/dL (ref 30.0–36.0)
MCV: 91 fL (ref 80.0–100.0)
Platelets: 310 10*3/uL (ref 150–400)
RBC: 3.89 MIL/uL (ref 3.87–5.11)
RDW: 12.5 % (ref 11.5–15.5)
WBC: 8.6 10*3/uL (ref 4.0–10.5)
nRBC: 0 % (ref 0.0–0.2)

## 2023-08-03 NOTE — Telephone Encounter (Signed)
Pt calls triage line and states that she has seen abnormal urine results via mychart and is calling to inquire if she needs an antibiotic. Advised pt that she is viewing urinalysis and we need to await the results of her final urine culture. Pt voiced understanding.

## 2023-08-04 ENCOUNTER — Telehealth: Payer: Self-pay

## 2023-08-04 LAB — CULTURE, URINE COMPREHENSIVE

## 2023-08-04 MED ORDER — AMOXICILLIN 875 MG PO TABS: 875.0000 mg | ORAL_TABLET | Freq: Two times a day (BID) | ORAL | 0 refills | Status: DC

## 2023-08-04 NOTE — Telephone Encounter (Signed)
-----   Message from Verna Czech [facility] sent at 08/04/2023  6:57 AM EDT ----- Preop urine culture was positive.  Please send Rx amoxicillin 875 mg twice daily x 7 days

## 2023-08-04 NOTE — Telephone Encounter (Signed)
Spoke with patient. Advised of all results and verbalized understanding. Medication sent into pharmacy in [place], [place] where patient is currently at.

## 2023-08-07 MED ORDER — ORAL CARE MOUTH RINSE: 15.0000 mL | Freq: Once | OROMUCOSAL | Status: AC

## 2023-08-07 MED ORDER — SODIUM CHLORIDE 0.9 % IV SOLN: INTRAVENOUS | Status: DC

## 2023-08-07 MED ORDER — GEMCITABINE CHEMO FOR BLADDER INSTILLATION 2000 MG
2000.0000 mg | Freq: Once | INTRAVENOUS | Status: DC
  Filled 2023-08-07: qty 52.6

## 2023-08-07 MED ORDER — CHLORHEXIDINE GLUCONATE 0.12 % MT SOLN
15.0000 mL | Freq: Once | OROMUCOSAL | Status: AC
Start: 2023-08-07 — End: 2023-08-08
  Administered 2023-08-08: 15 mL via OROMUCOSAL

## 2023-08-07 MED ORDER — CEFAZOLIN SODIUM-DEXTROSE 2-4 GM/100ML-% IV SOLN
2.0000 g | INTRAVENOUS | Status: AC
  Administered 2023-08-08: 2 g via INTRAVENOUS

## 2023-08-08 ENCOUNTER — Ambulatory Visit: Payer: Medicare HMO | Admitting: Certified Registered Nurse Anesthetist

## 2023-08-08 ENCOUNTER — Other Ambulatory Visit: Payer: Self-pay

## 2023-08-08 ENCOUNTER — Ambulatory Visit: Payer: Medicare HMO | Admitting: Urgent Care

## 2023-08-08 ENCOUNTER — Encounter
Admission: RE | Disposition: A | Discharge status: Home / Self Care | Payer: Self-pay | Source: Home / Self Care | Attending: Urology

## 2023-08-08 ENCOUNTER — Ambulatory Visit
Admission: RE | Admit: 2023-08-08 | Discharge: 2023-08-08 | Disposition: A | Discharge status: Home / Self Care | Payer: Medicare HMO | Attending: Urology | Admitting: Urology

## 2023-08-08 ENCOUNTER — Encounter: Payer: Self-pay | Admitting: Urology

## 2023-08-08 DIAGNOSIS — Z7984 Long term (current) use of oral hypoglycemic drugs: Secondary | ICD-10-CM | POA: Insufficient documentation

## 2023-08-08 DIAGNOSIS — N183 Chronic kidney disease, stage 3 unspecified: Secondary | ICD-10-CM | POA: Diagnosis not present

## 2023-08-08 DIAGNOSIS — Z794 Long term (current) use of insulin: Secondary | ICD-10-CM | POA: Insufficient documentation

## 2023-08-08 DIAGNOSIS — E559 Vitamin D deficiency, unspecified: Secondary | ICD-10-CM | POA: Diagnosis not present

## 2023-08-08 DIAGNOSIS — C672 Malignant neoplasm of lateral wall of bladder: Secondary | ICD-10-CM | POA: Insufficient documentation

## 2023-08-08 DIAGNOSIS — M797 Fibromyalgia: Secondary | ICD-10-CM | POA: Insufficient documentation

## 2023-08-08 DIAGNOSIS — Z6838 Body mass index (BMI) 38.0-38.9, adult: Secondary | ICD-10-CM | POA: Insufficient documentation

## 2023-08-08 DIAGNOSIS — Z8616 Personal history of COVID-19: Secondary | ICD-10-CM | POA: Diagnosis not present

## 2023-08-08 DIAGNOSIS — D509 Iron deficiency anemia, unspecified: Secondary | ICD-10-CM | POA: Insufficient documentation

## 2023-08-08 DIAGNOSIS — F32A Depression, unspecified: Secondary | ICD-10-CM | POA: Insufficient documentation

## 2023-08-08 DIAGNOSIS — F419 Anxiety disorder, unspecified: Secondary | ICD-10-CM | POA: Insufficient documentation

## 2023-08-08 DIAGNOSIS — E1122 Type 2 diabetes mellitus with diabetic chronic kidney disease: Secondary | ICD-10-CM | POA: Diagnosis not present

## 2023-08-08 DIAGNOSIS — E785 Hyperlipidemia, unspecified: Secondary | ICD-10-CM | POA: Diagnosis not present

## 2023-08-08 DIAGNOSIS — G8929 Other chronic pain: Secondary | ICD-10-CM | POA: Diagnosis not present

## 2023-08-08 DIAGNOSIS — D494 Neoplasm of unspecified behavior of bladder: Secondary | ICD-10-CM | POA: Diagnosis not present

## 2023-08-08 DIAGNOSIS — I129 Hypertensive chronic kidney disease with stage 1 through stage 4 chronic kidney disease, or unspecified chronic kidney disease: Secondary | ICD-10-CM | POA: Insufficient documentation

## 2023-08-08 DIAGNOSIS — Z01812 Encounter for preprocedural laboratory examination: Secondary | ICD-10-CM

## 2023-08-08 DIAGNOSIS — C679 Malignant neoplasm of bladder, unspecified: Secondary | ICD-10-CM | POA: Diagnosis not present

## 2023-08-08 DIAGNOSIS — K219 Gastro-esophageal reflux disease without esophagitis: Secondary | ICD-10-CM | POA: Diagnosis not present

## 2023-08-08 DIAGNOSIS — N3289 Other specified disorders of bladder: Secondary | ICD-10-CM | POA: Diagnosis not present

## 2023-08-08 DIAGNOSIS — E1165 Type 2 diabetes mellitus with hyperglycemia: Secondary | ICD-10-CM

## 2023-08-08 HISTORY — PX: TRANSURETHRAL RESECTION OF BLADDER TUMOR: SHX2575

## 2023-08-08 LAB — GLUCOSE, CAPILLARY
Glucose-Capillary: 202 mg/dL — ABNORMAL HIGH (ref 70–99)
Glucose-Capillary: 216 mg/dL — ABNORMAL HIGH (ref 70–99)

## 2023-08-08 SURGERY — TURBT (TRANSURETHRAL RESECTION OF BLADDER TUMOR)
Anesthesia: General

## 2023-08-08 MED ORDER — GLYCOPYRROLATE 0.2 MG/ML IJ SOLN
INTRAMUSCULAR | Status: AC
Start: 2023-08-08 — End: ?
  Filled 2023-08-08: qty 1

## 2023-08-08 MED ORDER — GLYCOPYRROLATE 0.2 MG/ML IJ SOLN: INTRAMUSCULAR | Status: DC | PRN

## 2023-08-08 MED ORDER — MIDAZOLAM HCL 2 MG/2ML IJ SOLN
INTRAMUSCULAR | Status: DC | PRN
  Administered 2023-08-08: 2 mg via INTRAVENOUS

## 2023-08-08 MED ORDER — DROPERIDOL 2.5 MG/ML IJ SOLN
INTRAMUSCULAR | Status: AC
  Filled 2023-08-08: qty 2

## 2023-08-08 MED ORDER — PHENAZOPYRIDINE HCL 200 MG PO TABS: 200.0000 mg | ORAL_TABLET | Freq: Three times a day (TID) | ORAL | 0 refills | Status: DC | PRN

## 2023-08-08 MED ORDER — ONDANSETRON HCL 4 MG/2ML IJ SOLN
INTRAMUSCULAR | Status: DC | PRN
  Administered 2023-08-08: 4 mg via INTRAVENOUS

## 2023-08-08 MED ORDER — DROPERIDOL 2.5 MG/ML IJ SOLN
0.6250 mg | Freq: Once | INTRAMUSCULAR | Status: AC | PRN
  Administered 2023-08-08: 0.625 mg via INTRAVENOUS

## 2023-08-08 MED ORDER — DEXAMETHASONE SODIUM PHOSPHATE 10 MG/ML IJ SOLN
INTRAMUSCULAR | Status: DC | PRN
  Administered 2023-08-08: 4 mg via INTRAVENOUS

## 2023-08-08 MED ORDER — ACETAMINOPHEN 10 MG/ML IV SOLN
INTRAVENOUS | Status: DC | PRN
  Administered 2023-08-08: 1000 mg via INTRAVENOUS

## 2023-08-08 MED ORDER — CEFAZOLIN SODIUM-DEXTROSE 2-4 GM/100ML-% IV SOLN
INTRAVENOUS | Status: AC
Start: 2023-08-08 — End: ?
  Filled 2023-08-08: qty 100

## 2023-08-08 MED ORDER — CHLORHEXIDINE GLUCONATE 0.12 % MT SOLN
OROMUCOSAL | Status: AC
  Filled 2023-08-08: qty 15

## 2023-08-08 MED ORDER — EPHEDRINE SULFATE-NACL 50-0.9 MG/10ML-% IV SOSY
PREFILLED_SYRINGE | INTRAVENOUS | Status: DC | PRN
  Administered 2023-08-08 (×3): 5 mg via INTRAVENOUS

## 2023-08-08 MED ORDER — ACETAMINOPHEN 10 MG/ML IV SOLN
INTRAVENOUS | Status: AC
  Filled 2023-08-08: qty 100

## 2023-08-08 MED ORDER — GEMCITABINE CHEMO FOR BLADDER INSTILLATION 2000 MG
INTRAVENOUS | Status: DC | PRN
  Administered 2023-08-08: 2000 mg via INTRAVESICAL

## 2023-08-08 MED ORDER — FENTANYL CITRATE (PF) 100 MCG/2ML IJ SOLN: 25.0000 ug | INTRAMUSCULAR | Status: DC | PRN

## 2023-08-08 MED ORDER — FENTANYL CITRATE (PF) 100 MCG/2ML IJ SOLN
INTRAMUSCULAR | Status: AC
  Filled 2023-08-08: qty 2

## 2023-08-08 MED ORDER — LIDOCAINE HCL (CARDIAC) PF 100 MG/5ML IV SOSY
PREFILLED_SYRINGE | INTRAVENOUS | Status: DC | PRN
  Administered 2023-08-08: 100 mg via INTRAVENOUS

## 2023-08-08 MED ORDER — DEXAMETHASONE SODIUM PHOSPHATE 10 MG/ML IJ SOLN
INTRAMUSCULAR | Status: AC
  Filled 2023-08-08: qty 1

## 2023-08-08 MED ORDER — OXYBUTYNIN CHLORIDE 5 MG PO TABS: ORAL_TABLET | ORAL | 0 refills | Status: DC

## 2023-08-08 MED ORDER — PROPOFOL 10 MG/ML IV BOLUS
INTRAVENOUS | Status: DC | PRN
  Administered 2023-08-08: 200 mg via INTRAVENOUS

## 2023-08-08 MED ORDER — ONDANSETRON HCL 4 MG/2ML IJ SOLN
INTRAMUSCULAR | Status: AC
  Filled 2023-08-08: qty 2

## 2023-08-08 MED ORDER — SODIUM CHLORIDE 0.9 % IR SOLN
Status: DC | PRN
  Administered 2023-08-08: 3000 mL

## 2023-08-08 MED ORDER — FENTANYL CITRATE (PF) 100 MCG/2ML IJ SOLN
INTRAMUSCULAR | Status: DC | PRN
  Administered 2023-08-08 (×4): 25 ug via INTRAVENOUS

## 2023-08-08 MED ORDER — MIDAZOLAM HCL 2 MG/2ML IJ SOLN
INTRAMUSCULAR | Status: AC
  Filled 2023-08-08: qty 2

## 2023-08-08 MED ORDER — LIDOCAINE HCL (PF) 2 % IJ SOLN
INTRAMUSCULAR | Status: AC
Start: 2023-08-08 — End: ?
  Filled 2023-08-08: qty 5

## 2023-08-08 MED ORDER — PROPOFOL 10 MG/ML IV BOLUS
INTRAVENOUS | Status: AC
  Filled 2023-08-08: qty 40

## 2023-08-08 SURGICAL SUPPLY — 25 items
BAG DRAIN SIEMENS DORNER NS (MISCELLANEOUS) ×1 IMPLANT
BAG DRN NS LF (MISCELLANEOUS) ×1
BAG DRN RND TRDRP ANRFLXCHMBR (UROLOGICAL SUPPLIES) ×1
BAG URINE DRAIN 2000ML AR STRL (UROLOGICAL SUPPLIES) ×1 IMPLANT
CATH FOLEY 2WAY 18X30 (CATHETERS) IMPLANT
CATH FOLEY 2WAY SIL 18X30 (CATHETERS) ×1
DRAPE UTILITY 15X26 TOWEL STRL (DRAPES) ×1 IMPLANT
DRSG TELFA 3X4 N-ADH STERILE (GAUZE/BANDAGES/DRESSINGS) ×1 IMPLANT
ELECT LOOP 22F BIPOLAR SML (ELECTROSURGICAL) ×1
ELECT REM PT RETURN 9FT ADLT (ELECTROSURGICAL)
ELECTRODE LOOP 22F BIPOLAR SML (ELECTROSURGICAL) IMPLANT
ELECTRODE REM PT RTRN 9FT ADLT (ELECTROSURGICAL) IMPLANT
GLOVE BIOGEL PI IND STRL 7.5 (GLOVE) ×1 IMPLANT
GOWN STRL REUS W/ TWL LRG LVL3 (GOWN DISPOSABLE) ×1 IMPLANT
GOWN STRL REUS W/TWL LRG LVL3 (GOWN DISPOSABLE) ×1
GOWN STRL REUS W/TWL XL LVL4 (GOWN DISPOSABLE) ×1 IMPLANT
IV NS IRRIG 3000ML ARTHROMATIC (IV SOLUTION) ×2 IMPLANT
KIT TURNOVER CYSTO (KITS) ×1 IMPLANT
LOOP CUT BIPOLAR 24F LRG (ELECTROSURGICAL) IMPLANT
PACK CYSTO AR (MISCELLANEOUS) ×1 IMPLANT
SET IRRIG Y TYPE TUR BLADDER L (SET/KITS/TRAYS/PACK) ×1 IMPLANT
SURGILUBE 2OZ TUBE FLIPTOP (MISCELLANEOUS) ×1 IMPLANT
SYR TOOMEY IRRIG 70ML (MISCELLANEOUS) ×1
SYRINGE TOOMEY IRRIG 70ML (MISCELLANEOUS) ×1 IMPLANT
WATER STERILE IRR 500ML POUR (IV SOLUTION) ×1 IMPLANT

## 2023-08-08 NOTE — Interval H&P Note (Signed)
History and Physical Interval Note:  08/08/2023 7:28 AM  Danielle Edwards  has presented today for surgery, with the diagnosis of Bladder Tumor.  The various methods of treatment have been discussed with the patient and family. After consideration of risks, benefits and other options for treatment, the patient has consented to  Procedure(s): TRANSURETHRAL RESECTION OF BLADDER TUMOR (TURBT) (N/A) as a surgical intervention.  The patient's history has been reviewed, patient examined, no change in status, stable for surgery.  I have reviewed the patient's chart and labs.  Questions were answered to the patient's satisfaction.    CV: RRR Lungs: Clear  Elleah Hemsley C Sefora Tietje

## 2023-08-08 NOTE — Discharge Instructions (Signed)
Transurethral Resection of Bladder Tumor (TURBT) or Bladder Biopsy   General instructions:     Your recent bladder surgery requires very little post hospital care but some definite precautions.  Despite the fact that no skin incisions were used, the area around the bladder incisions are raw and covered with scabs to promote healing and prevent bleeding. Certain precautions are needed to insure that the scabs are not disturbed over the next 2-4 weeks while the healing proceeds.  Because the raw surface inside your bladder and the irritating effects of urine you may expect frequency of urination and/or urgency (a stronger desire to urinate) and perhaps even getting up at night more often. This will usually resolve or improve slowly over the healing period. You may see some blood in your urine over the first 6 weeks. Do not be alarmed, even if the urine was clear for a while. Get off your feet and drink lots of fluids until clearing occurs. If you start to pass clots or don't improve call us.  Diet:  You may return to your normal diet immediately. Because of the raw surface of your bladder, alcohol, spicy foods, foods high in acid and drinks with caffeine may cause irritation or frequency and should be used in moderation. To keep your urine flowing freely and avoid constipation, drink plenty of fluids during the day (8-10 glasses). Tip: Avoid cranberry juice because it is very acidic.  Activity:  Your physical activity doesn't need to be restricted. However, if you are very active, you may see some blood in the urine. We suggest that you reduce your activity under the circumstances until the bleeding has stopped.  Bowels:  It is important to keep your bowels regular during the postoperative period. Straining with bowel movements can cause bleeding. A bowel movement every other day is reasonable. Use a mild laxative if needed, such as milk of magnesia 2-3 tablespoons, or 2 Dulcolax tablets. Call if  you continue to have problems. If you had been taking narcotics for pain, before, during or after your surgery, you may be constipated. Take a laxative if necessary.    Medication:  You should resume your pre-surgery medications unless told not to. In addition you may be given an antibiotic to prevent or treat infection. Antibiotics are not always necessary. All medication should be taken as prescribed until the bottles are finished unless you are having an unusual reaction to one of the drugs.  Prescriptions for bladder irritation and burning were sent to your pharmacy   [facility] [address], [address] [place], [place] [address] [phone]

## 2023-08-08 NOTE — Anesthesia Preprocedure Evaluation (Signed)
Anesthesia Evaluation  Patient identified by MRN, date of birth, ID band Patient awake    Reviewed: Allergy & Precautions, H&P , NPO status , Patient's Chart, lab work & pertinent test results, reviewed documented beta blocker date and time   History of Anesthesia Complications Negative for: history of anesthetic complications  Airway Mallampati: IV  TM Distance: >3 FB Neck ROM: full    Dental  (+) Dental Advidsory Given, Caps, Implants, Poor Dentition   Pulmonary neg shortness of breath, asthma , sleep apnea , neg COPD, neg recent URI   Pulmonary exam normal breath sounds clear to auscultation       Cardiovascular Exercise Tolerance: Good hypertension, (-) angina (-) Past MI and (-) Cardiac Stents Normal cardiovascular exam(-) dysrhythmias (-) Valvular Problems/Murmurs Rhythm:regular Rate:Normal     Neuro/Psych  Headaches, neg Seizures PSYCHIATRIC DISORDERS Anxiety Depression     Neuromuscular disease (fibromyalgia)    GI/Hepatic Neg liver ROS,GERD  ,,  Endo/Other  diabetes  Morbid obesity  Renal/GU CRFRenal disease  negative genitourinary   Musculoskeletal   Abdominal   Peds  Hematology negative hematology ROS (+)   Anesthesia Other Findings Past Medical History: No date: Allergy     Comment:  seasonal allergies No date: Anemia     Comment:  hx of IDA No date: Anxiety     Comment:  on meds No date: Arthritis     Comment:  PRN meds-osteoarthritis No date: Asthma No date: Cataract     Comment:  sx to remove No date: Chronic kidney disease     Comment:  CKD-stage 3 No date: Chronic pain 06/02/2020: COVID-19 No date: Depression     Comment:  on meds No date: Diabetes type 2, controlled (HCC)     Comment:  on meds No date: Fibromyalgia No date: GERD (gastroesophageal reflux disease)     Comment:  on meds No date: Headache 1999: History of colonic polyps     Comment:  Benign 09/27/2000: Hx of  adenomatous polyp of colon     Comment:  2001 - diminutive adenoma [place]hour 2007 no polyps -               [place] No date: Hyperlipidemia     Comment:  on meds No date: Hypertension     Comment:  on meds No date: Hypothyroidism     Comment:  not on meds at this time No date: IBS (irritable bowel syndrome)     Comment:  Diarrhea type 02/09/2022: IDA (iron deficiency anemia) No date: Neuromuscular disorder (HCC) No date: Sleep apnea     Comment:  never tested but cant lay flat No date: Vitamin D deficiency   Reproductive/Obstetrics negative OB ROS                              Anesthesia Physical Anesthesia Plan  ASA: 3  Anesthesia Plan: General   Post-op Pain Management:    Induction: Intravenous  PONV Risk Score and Plan: 3 and Ondansetron, Dexamethasone and Treatment may vary due to age or medical condition  Airway Management Planned: LMA and Oral ETT  Additional Equipment:   Intra-op Plan:   Post-operative Plan: Extubation in OR  Informed Consent: I have reviewed the patients History and Physical, chart, labs and discussed the procedure including the risks, benefits and alternatives for the proposed anesthesia with the patient or authorized representative who has indicated his/her understanding and acceptance.     Dental  Advisory Given  Plan Discussed with: Anesthesiologist, CRNA and Surgeon  Anesthesia Plan Comments:          Anesthesia Quick Evaluation

## 2023-08-08 NOTE — Transfer of Care (Signed)
Immediate Anesthesia Transfer of Care Note  Patient: Danielle Edwards  Procedure(s) Performed: TRANSURETHRAL RESECTION OF BLADDER TUMOR (TURBT)  Patient Location: PACU  Anesthesia Type:General  Level of Consciousness: drowsy  Airway & Oxygen Therapy: Patient Spontanous Breathing and Patient connected to face mask oxygen  Post-op Assessment: Report given to RN and Post -op Vital signs reviewed and stable  Post vital signs: Reviewed and stable  Last Vitals:  Vitals Value Taken Time  BP    Temp    Pulse 97 08/08/23 0813  Resp 10 08/08/23 0813  SpO2 100 % 08/08/23 0813    Last Pain:  Vitals:   08/08/23 0626  TempSrc: Temporal  PainSc: 5          Complications: No notable events documented.

## 2023-08-08 NOTE — Anesthesia Procedure Notes (Signed)
Procedure Name: LMA Insertion Date/Time: 08/08/2023 7:49 AM  Performed by: Malva Cogan, CRNAPre-anesthesia Checklist: Patient identified, Patient being monitored, Timeout performed, Emergency Drugs available and Suction available Patient Re-evaluated:Patient Re-evaluated prior to induction Oxygen Delivery Method: Circle system utilized Preoxygenation: Pre-oxygenation with 100% oxygen Induction Type: IV induction Ventilation: Mask ventilation without difficulty LMA: LMA inserted LMA Size: 4.0 Tube type: Oral Number of attempts: 1 Placement Confirmation: positive ETCO2 and breath sounds checked- equal and bilateral Tube secured with: Tape Dental Injury: Teeth and Oropharynx as per pre-operative assessment

## 2023-08-08 NOTE — Op Note (Signed)
   Preoperative diagnosis: Bladder tumor (< 2 cm)  Postoperative diagnosis:  Bladder tumor (< 2 cm)  Procedure:  Cystoscopy Transurethral resection of bladder tumor (< 2 cm) Instillation of intravesical gemcitabine   Surgeon: Lorin Picket C. Rheta Hemmelgarn, M.D.  Anesthesia: General  Complications: None  Intraoperative findings:  Bladder tumor:  1 cm papillary tumor inferior aspect anterior left lateral wall; 5 mm papillary tumor just posterior to above tumor; 2 mm papillary tumor just anterior to largest tumor Whitish nodular area or just lateral to 1 cm papillary tumor   EBL: Minimal  Specimens: Bladder tumor Nodular area left bladder wall      Indication: Danielle Edwards is a 67 y.o. female initially referred for evaluation of dysuria and pelvic pain.  Cystoscopy incidentally with findings of papillary bladder tumors as above.  After reviewing the management options for treatment, she elected to proceed with the above surgical procedure(s). We have discussed the potential benefits and risks of the procedure, side effects of the proposed treatment, the likelihood of the patient achieving the goals of the procedure, and any potential problems that might occur during the procedure or recuperation. Informed consent has been obtained.  Description of procedure:  The patient was taken to the operating room and general anesthesia was induced.  The patient was placed in the dorsal lithotomy position, prepped and draped in the usual sterile fashion, and preoperative antibiotics were administered. A preoperative time-out was performed.   A 21 French cystoscope with 30 lens was lubricated and passed per urethra.  Panendoscopy was performed with findings as described above.  The scope was removed and a 24 French continuous-flow resectoscope sheath with obturator was lubricated and passed per urethra.    The obturator was replaced with an Wandra Scot resectoscope with loop.  The bladder was then  re-examined after the resectoscope was placed.  With findings as described above  Using bipolar current the papillary tumors resected to superficial muscle and removed via irrigation.  Hemostasis was obtained with cautery.  The nodular area was also resected in a similar fashion.  Hemostasis was obtained with cautery.  The bladder was then emptied and the procedure ended.  The patient appeared to tolerate the procedure well and without complications.  The patient was able to be awakened and transferred to the recovery unit in satisfactory condition.   2000 mg of intravesical gemcitabine was instilled to the bladder.  This was allowed to dwell in the PACU for 1 hour.  This was well-tolerated.  After 1 hour, the catheter was drained and removed.   Jasimine Simms C. Lonna Cobb,  MD

## 2023-08-09 LAB — SURGICAL PATHOLOGY

## 2023-08-10 ENCOUNTER — Encounter: Payer: Self-pay | Admitting: Internal Medicine

## 2023-08-11 ENCOUNTER — Telehealth: Payer: Self-pay | Admitting: Urology

## 2023-08-11 NOTE — Anesthesia Postprocedure Evaluation (Signed)
Anesthesia Post Note  Patient: Danielle Edwards  Procedure(s) Performed: TRANSURETHRAL RESECTION OF BLADDER TUMOR (TURBT) w/ INSTILLATION OF GEMCITABINE  Patient location during evaluation: PACU Anesthesia Type: General Level of consciousness: awake and alert Pain management: pain level controlled Vital Signs Assessment: post-procedure vital signs reviewed and stable Respiratory status: spontaneous breathing, nonlabored ventilation, respiratory function stable and patient connected to nasal cannula oxygen Cardiovascular status: blood pressure returned to baseline and stable Postop Assessment: no apparent nausea or vomiting Anesthetic complications: no   No notable events documented.   Last Vitals:  Vitals:   08/08/23 0915 08/08/23 0935  BP: (!) 167/75 (!) 178/84  Pulse: 99 99  Resp: 15 16  Temp: (!) 36.1 C (!) 36.1 C  SpO2: 97% 95%    Last Pain:  Vitals:   08/08/23 0935  TempSrc: Temporal  PainSc: 0-No pain                 Lenard Simmer

## 2023-08-11 NOTE — Telephone Encounter (Signed)
Pt left another message asking to please call her back to discuss pathology.

## 2023-08-11 NOTE — Telephone Encounter (Signed)
Patient just called and left a message on triage line stating she missed our call. Please call back

## 2023-08-11 NOTE — Telephone Encounter (Signed)
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  We discussed no further treatment is needed.  She did receive postresection gemcitabine.  We discussed the recommendation of surveillance cystoscopy to monitor for recurrent tumors.  Will schedule a follow-up office visit in 4 months for surveillance cystoscopy.  She was informed that if this is negative she would need a repeat cystoscopy 9 months later and if no recurrent tumor annually for at least 5 years.  All questions were answered.  Message sent to schedule follow-up cystoscopy 4 months

## 2023-08-11 NOTE — Telephone Encounter (Signed)
I called Danielle Edwards to discuss her bladder pathology results.  Left message on VM to contact the office.

## 2023-08-14 ENCOUNTER — Other Ambulatory Visit: Payer: Self-pay

## 2023-08-14 MED ORDER — FLUCONAZOLE 100 MG PO TABS: 100.0000 mg | ORAL_TABLET | Freq: Every day | ORAL | 0 refills | Status: DC

## 2023-08-25 ENCOUNTER — Telehealth: Payer: Self-pay | Admitting: Primary Care

## 2023-08-25 DIAGNOSIS — E1165 Type 2 diabetes mellitus with hyperglycemia: Secondary | ICD-10-CM

## 2023-08-25 MED ORDER — METFORMIN HCL ER 500 MG PO TB24
2000.0000 mg | ORAL_TABLET | Freq: Every day | ORAL | 1 refills | Status: DC
Start: 2023-08-25 — End: 2024-02-21

## 2023-08-25 NOTE — Telephone Encounter (Signed)
Prescription Request  08/25/2023  LOV: 06/14/2023  What is the name of the medication or equipment?  metFORMIN (GLUCOPHAGE-XR) 500 MG 24 hr table  Have you contacted your pharmacy to request a refill? No   Which pharmacy would you like this sent to?   [facility] - [place], [place] - [address] Phone: [phone]  Fax: [phone]     (Patient has three more days of medication, headed to [facility] next week)  Patient notified that their request is being sent to the clinical staff for review and that they should receive a response within 2 business days.   Please advise at [facility] [phone]

## 2023-08-25 NOTE — Telephone Encounter (Signed)
Refills sent to pharmacy. 

## 2023-09-02 IMAGING — MG MM DIGITAL SCREENING BILAT W/ TOMO AND CAD
6 of 12 series · 6 of 36 positions shown · non-contrast
Comparison: Previous exam(s).

CLINICAL DATA: Screening.

EXAM:
DIGITAL SCREENING BILATERAL MAMMOGRAM WITH TOMOSYNTHESIS AND CAD
TECHNIQUE: Bilateral screening digital craniocaudal and mediolateral oblique
mammograms were obtained. Bilateral screening digital breast
tomosynthesis was performed. The images were evaluated with
computer-aided detection.

[R MLO synth-2D]
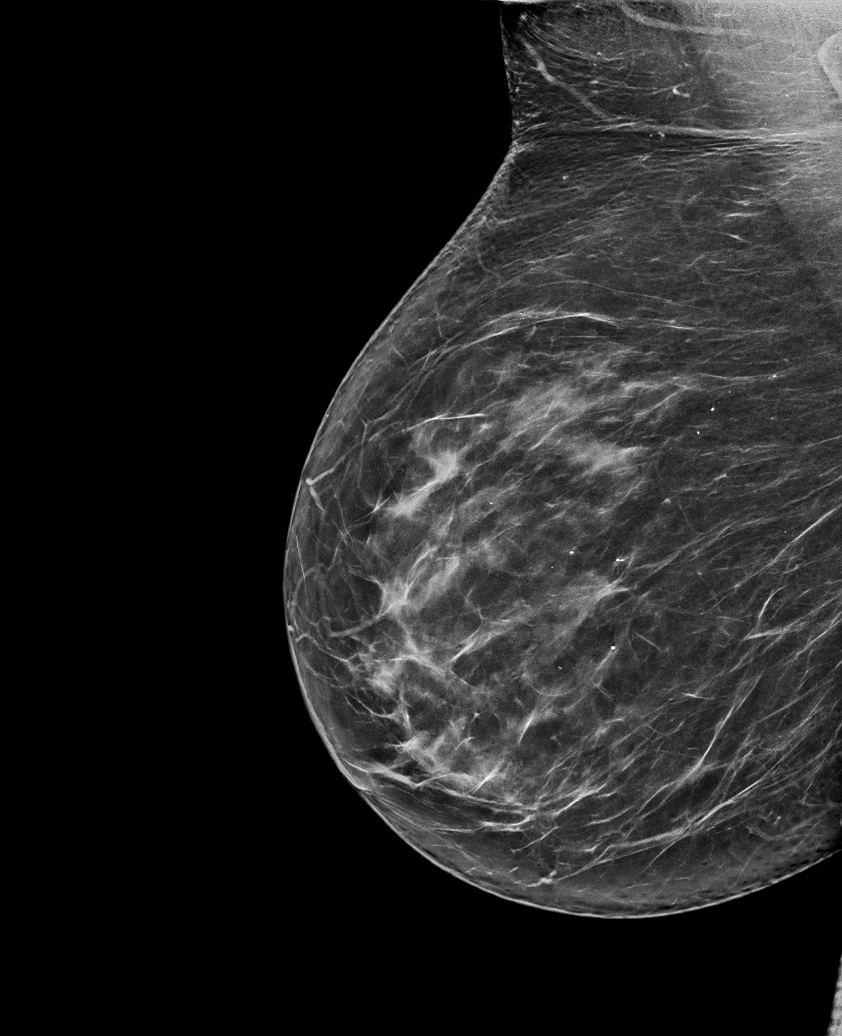

[L MLO synth-2D (1 of 2)]
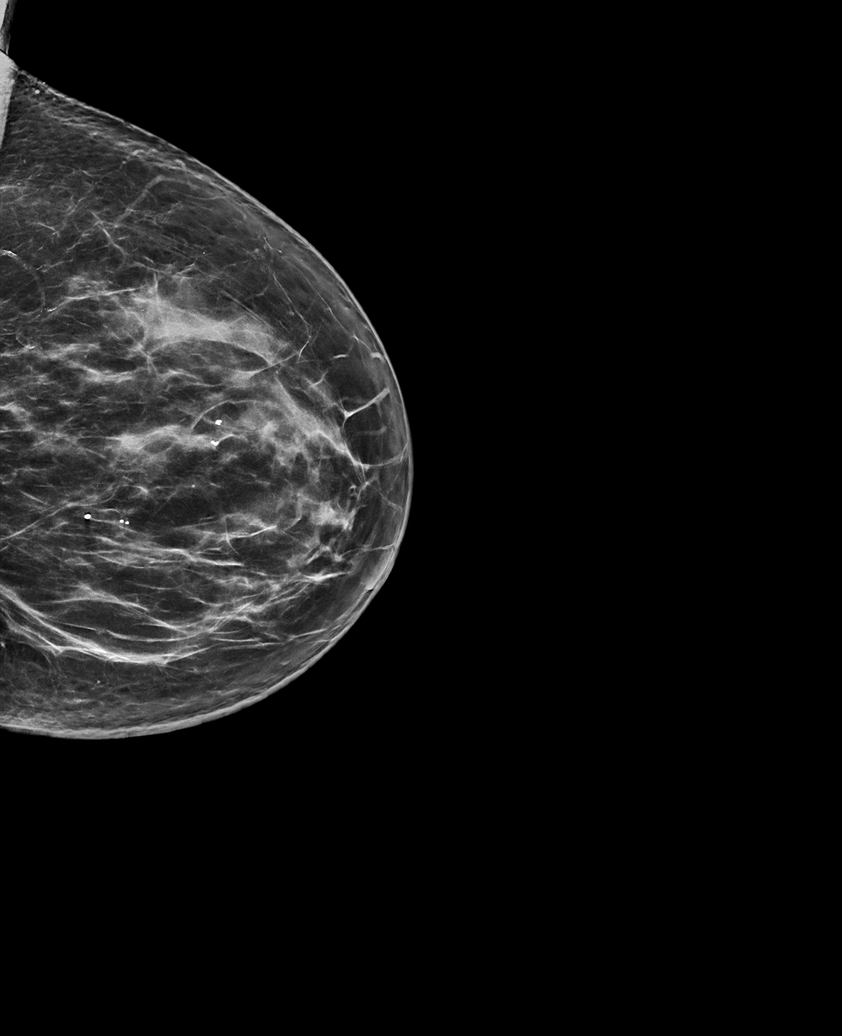

[L CC synth-2D]
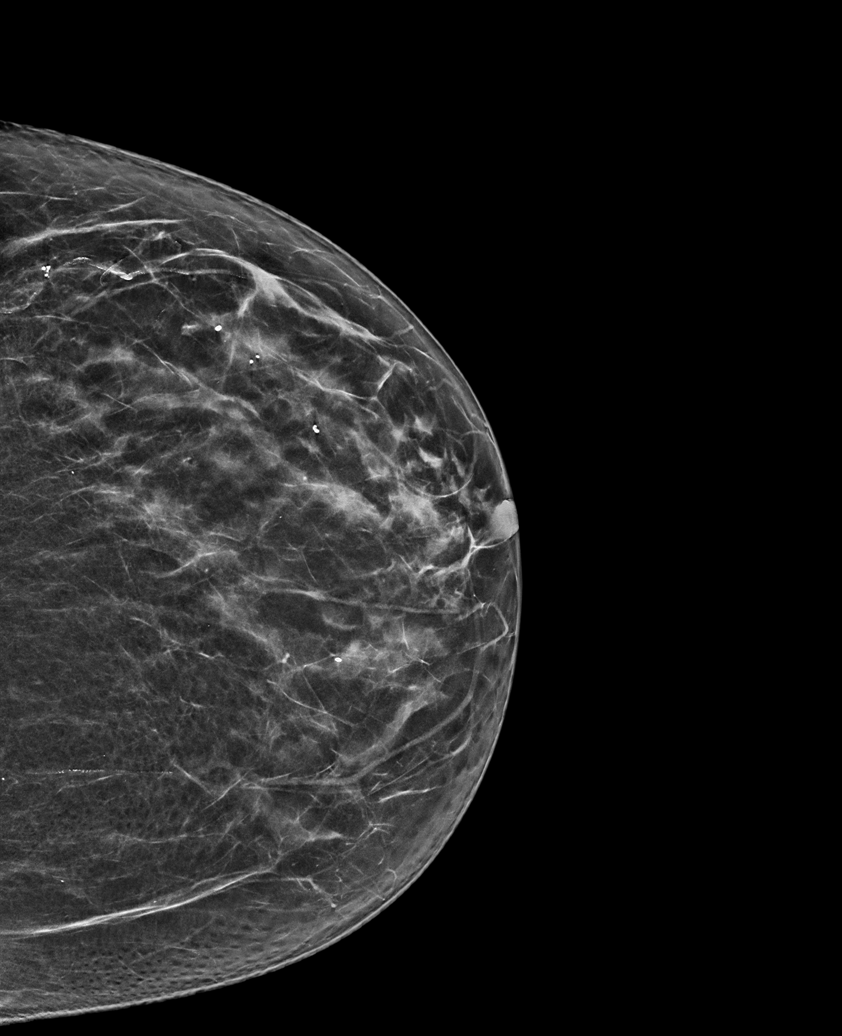

[R CC synth-2D]
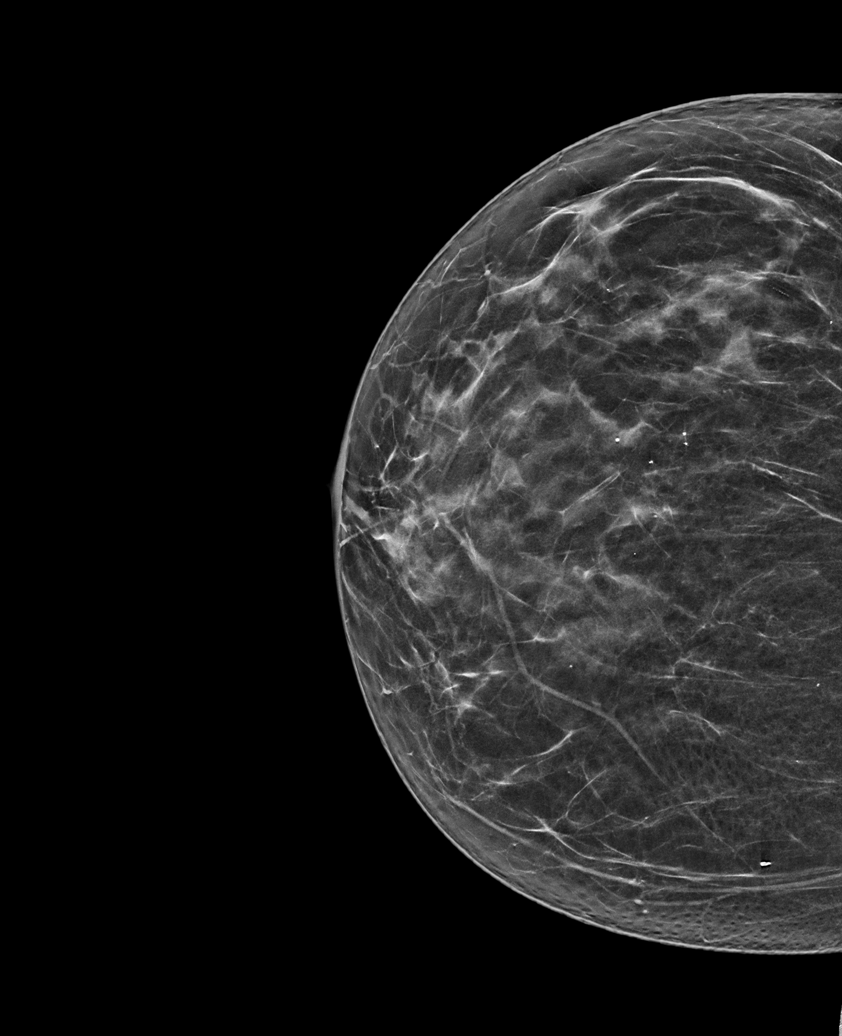

[L CV synth-2D]
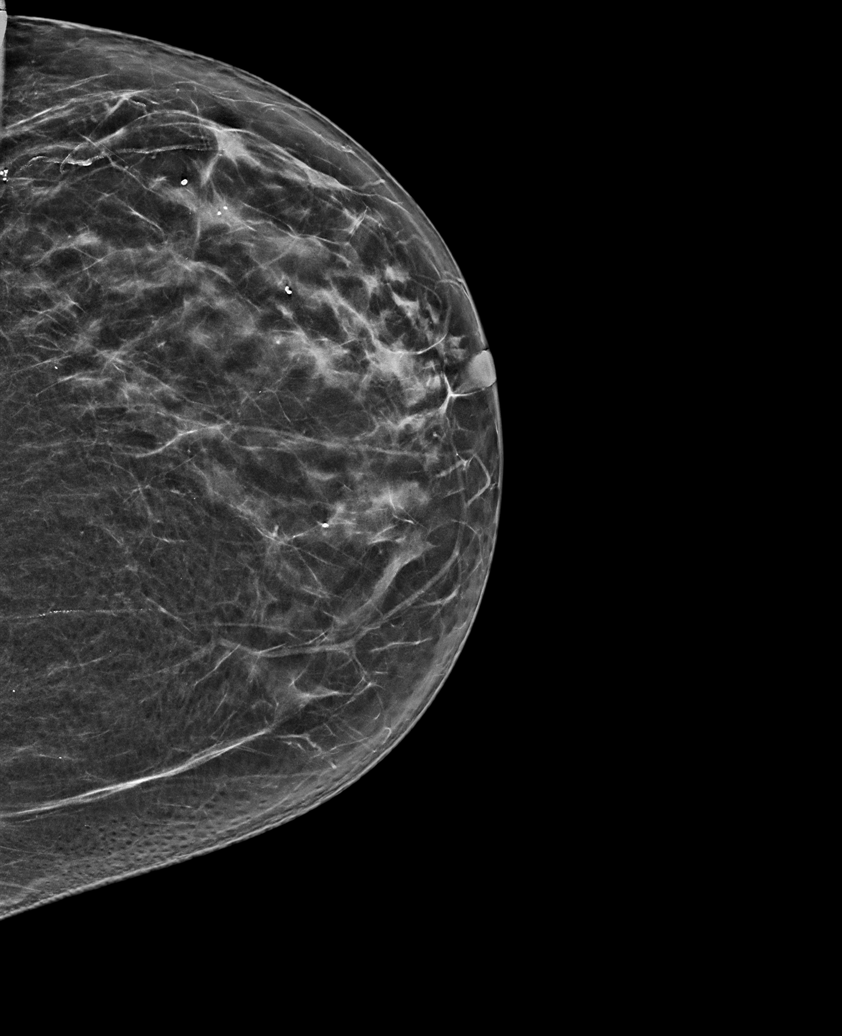

[L MLO synth-2D (2 of 2)]
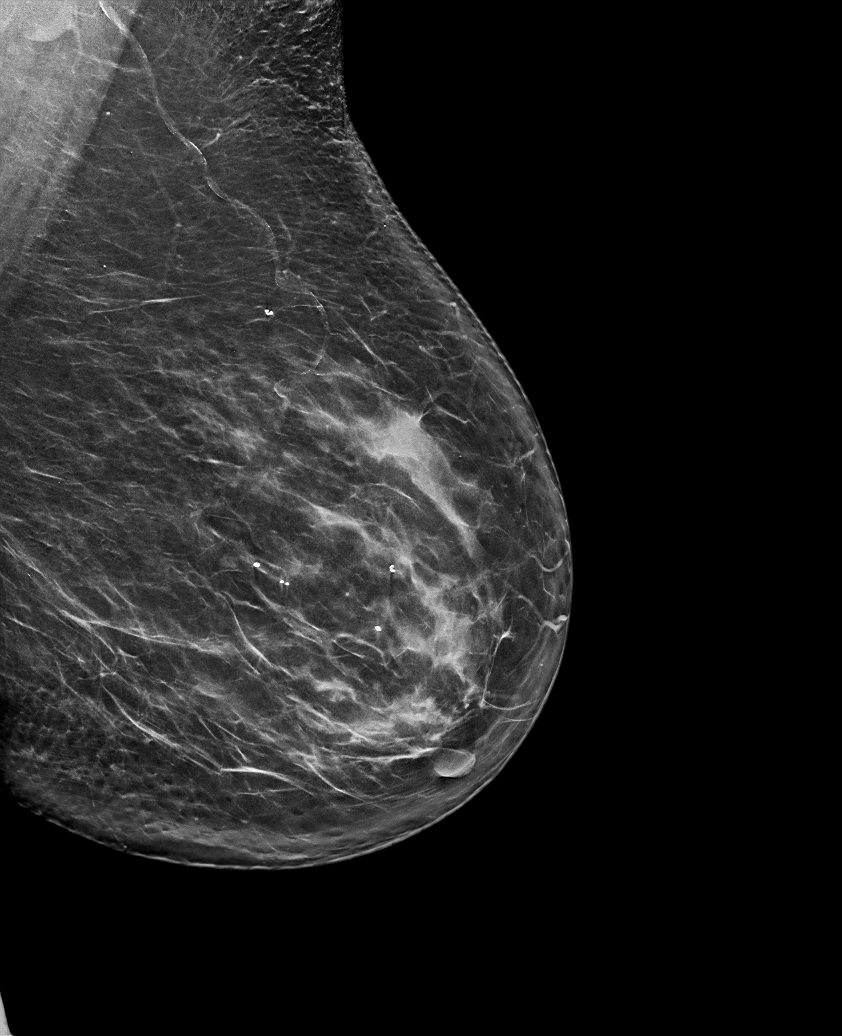

[6 of 36 positions shown; findings below may reference images not displayed]

ACR Breast Density Category b: There are scattered areas of
fibroglandular density.
FINDINGS: There are no findings suspicious for malignancy.
IMPRESSION: No mammographic evidence of malignancy. A result letter of this
screening mammogram will be mailed directly to the patient.

RECOMMENDATION:
Screening mammogram in one year. (Code:51-O-LD2)

BI-RADS CATEGORY  1: Negative.

## 2023-09-07 DIAGNOSIS — E119 Type 2 diabetes mellitus without complications: Secondary | ICD-10-CM | POA: Diagnosis not present

## 2023-09-12 ENCOUNTER — Other Ambulatory Visit: Payer: Self-pay

## 2023-09-12 DIAGNOSIS — E1165 Type 2 diabetes mellitus with hyperglycemia: Secondary | ICD-10-CM

## 2023-09-13 ENCOUNTER — Ambulatory Visit: Payer: Medicare HMO | Admitting: Physician Assistant

## 2023-09-13 ENCOUNTER — Encounter: Payer: Self-pay | Admitting: Physician Assistant

## 2023-09-13 VITALS — BP 164/81 | HR 93

## 2023-09-13 DIAGNOSIS — R300 Dysuria: Secondary | ICD-10-CM | POA: Diagnosis not present

## 2023-09-13 DIAGNOSIS — N3941 Urge incontinence: Secondary | ICD-10-CM | POA: Diagnosis not present

## 2023-09-13 LAB — URINALYSIS, COMPLETE
Bilirubin, UA: NEGATIVE
Glucose, UA: NEGATIVE
Ketones, UA: NEGATIVE
Nitrite, UA: NEGATIVE
RBC, UA: NEGATIVE
Specific Gravity, UA: 1.015 (ref 1.005–1.030)
Urobilinogen, Ur: 0.2 mg/dL (ref 0.2–1.0)
pH, UA: 6 (ref 5.0–7.5)

## 2023-09-13 LAB — MICROSCOPIC EXAMINATION: Epithelial Cells (non renal): >10 /[HPF] — AB (ref 0–10)

## 2023-09-13 MED ORDER — CIPROFLOXACIN HCL 250 MG PO TABS: 250.0000 mg | ORAL_TABLET | Freq: Two times a day (BID) | ORAL | 0 refills | Status: AC

## 2023-09-13 NOTE — Progress Notes (Signed)
09/13/2023 2:37 PM   Danielle Edwards 1956-05-30 [id number]  CC: Chief Complaint  Patient presents with   Follow-up   HPI: Danielle Edwards is a 67 y.o. female with PMH low-grade noninvasive urothelial carcinoma of the bladder s/p TURBT with Dr. Lonna Cobb on 08/08/2023 who presents today for evaluation of possible UTI.   Today she reports return of painful bladder/groin and dysuria about a week after undergoing TURBT.  She has been taking oxybutynin and Pyridium postop.  In-office UA today positive for trace protein and 1+ leukocytes; urine microscopy with 11-30 WBCs/HPF, >10 epithelial cells/hpf, and moderate bacteria.   PMH: Past Medical History:  Diagnosis Date   Allergy    seasonal allergies   Anemia    hx of IDA   Anxiety    on meds   Arthritis    PRN meds-osteoarthritis   Asthma    Cataract    sx to remove   Chronic kidney disease    CKD-stage 3   Chronic pain    COVID-19 06/02/2020   Depression    on meds   Diabetes type 2, controlled (HCC)    on meds   Fibromyalgia    GERD (gastroesophageal reflux disease)    on meds   Headache    History of colonic polyps 1999   Benign   Hx of adenomatous polyp of colon 09/27/2000   2001 - diminutive adenoma [place] 2007 no polyps - [place]   Hyperlipidemia    on meds   Hypertension    on meds   Hypothyroidism    not on meds at this time   IBS (irritable bowel syndrome)    Diarrhea type   IDA (iron deficiency anemia) 02/09/2022   Neuromuscular disorder (HCC)    Sleep apnea    never tested but cant lay flat   Vitamin D deficiency     Surgical History: Past Surgical History:  Procedure Laterality Date   CHOLECYSTECTOMY  1995   COLONOSCOPY  multiple   COLONOSCOPY  2016   TA's   EYE SURGERY Bilateral    cataracts   NASAL SINUS SURGERY  2001   POLYPECTOMY     TA's   TONSILLECTOMY     TONSILLECTOMY AND ADENOIDECTOMY     TRANSURETHRAL RESECTION OF BLADDER TUMOR N/A 08/08/2023   Procedure: TRANSURETHRAL  RESECTION OF BLADDER TUMOR (TURBT) w/ INSTILLATION OF GEMCITABINE;  Surgeon: Riki Altes, MD;  Location: [facility];  Service: Urology;  Laterality: N/A;   VAGINAL HYSTERECTOMY     Complete   WISDOM TOOTH EXTRACTION      Home Medications:  Allergies as of 09/13/2023   No Known Allergies      Medication List        Accurate as of September 13, 2023  2:37 PM. If you have any questions, ask your nurse or doctor.          Accu-Chek FastClix Lancets Misc USE AS INSTRUCTED TO CHECK BLOOD SUGAR UP TO 3 TIMES DAILY   Accu-Chek Guide test strip Generic drug: glucose blood USE UP TO FOUR TIMES DAILY TO CHECK BLOOD SUGAR (E10.9 E11.9)   amLODipine 10 MG tablet Commonly known as: NORVASC Take 1 tablet (10 mg total) by mouth daily. for blood pressure.   amoxicillin 875 MG tablet Commonly known as: AMOXIL Take 1 tablet (875 mg total) by mouth every 12 (twelve) hours.   atorvastatin 40 MG tablet Commonly known as: LIPITOR TAKE ONE TABLET BY MOUTH AT BEDTIME FOR CHOLESTEROL What changed:  how much to take how to take this when to take this additional instructions   BD Pen Needle Micro U/F 32G X 6 MM Misc Generic drug: Insulin Pen Needle USE ONCE DAILY TO INJECT INSULIN.   CENTRUM Danielle ULTRA WOMENS PO Take 1 tablet by mouth 2 (two) times a week.   DULoxetine 60 MG capsule Commonly known as: Cymbalta Take 1 capsule (60 mg total) by mouth daily. for anxiety and depression.   fluconazole 100 MG tablet Commonly known as: DIFLUCAN Take 1 tablet (100 mg total) by mouth daily.   ibuprofen 800 MG tablet Commonly known as: ADVIL Take 800 mg by mouth daily as needed (pain.).   IRON (FERROUS SULFATE) PO Take 1 tablet by mouth in the morning.   iron sucrose in sodium chloride 0.9 % 100 mL Inject into the vein as needed.   loperamide 2 MG tablet Commonly known as: IMODIUM A-D Take 2 mg by mouth 4 (four) times daily as needed for diarrhea or loose stools (ibs  symptoms.).   losartan 100 MG tablet Commonly known as: COZAAR Take 1 tablet (100 mg total) by mouth daily. for blood pressure.   MAGNESIUM PO Take 1 tablet by mouth daily.   metFORMIN 500 MG 24 hr tablet Commonly known as: GLUCOPHAGE-XR Take 4 tablets (2,000 mg total) by mouth daily with breakfast. for diabetes.   oxybutynin 5 MG tablet Commonly known as: DITROPAN 1 tab bid prn frequency,urgency, bladder spasm   oxyCODONE 5 MG immediate release tablet Commonly known as: Oxy IR/ROXICODONE 1-2 tablets every 4-6 hr as needed; #225 per month   pantoprazole 40 MG tablet Commonly known as: PROTONIX Take 40 mg by mouth daily as needed (indigestion/heartburn).   phenazopyridine 200 MG tablet Commonly known as: PYRIDIUM Take 1 tablet (200 mg total) by mouth 3 (three) times daily as needed (burning with urination).   promethazine 25 MG tablet Commonly known as: PHENERGAN Take 25 mg by mouth every 6 (six) hours as needed for nausea or vomiting. Reported on 09/21/2015   propranolol ER 120 MG 24 hr capsule Commonly known as: INDERAL LA Take 1 capsule (120 mg total) by mouth at bedtime. For headache prevention   Soliqua 100-33 UNT-MCG/ML Sopn Generic drug: Insulin Glargine-Lixisenatide INJECT 60 UNITS INTO THE SKIN DAILY FOR DIABETES.   Vitamin D3 125 MCG (5000 UT) Caps Take 5,000 Units by mouth daily.        Allergies:  No Known Allergies  Family History: Family History  Problem Relation Age of Onset   Colon cancer Mother 33   Lung cancer Mother 37   Skin cancer Mother    Colon polyps Mother 25   Heart disease Father    Diabetes Father    Skin cancer Father    Colon polyps Brother 17   Stomach cancer Paternal Grandmother 58   Breast cancer Neg Hx    Esophageal cancer Neg Hx    Rectal cancer Neg Hx     Social History:   reports that she has never smoked. She has never used smokeless tobacco. She reports current alcohol use. She reports that she does not use  drugs.  Physical Exam: BP (!) 164/81   Pulse 93   Constitutional:  Alert and oriented, no acute distress, nontoxic appearing HEENT: [place], AT Cardiovascular: No clubbing, cyanosis, or edema Respiratory: Normal respiratory effort, no increased work of breathing Skin: No rashes, bruises or suspicious lesions Neurologic: Grossly intact, no focal deficits, moving all 4 extremities Psychiatric: Normal mood and  affect  Laboratory Data: Results for orders placed or performed in visit on 09/13/23  Microscopic Examination   Urine  Result Value Ref Range   WBC, UA 11-30 (A) 0 - 5 /hpf   RBC, Urine 0-2 0 - 2 /hpf   Epithelial Cells (non renal) >10 (A) 0 - 10 /hpf   Mucus, UA Present (A) Not Estab.   Bacteria, UA Moderate (A) None seen/Few  Urinalysis, Complete  Result Value Ref Range   Specific Gravity, UA 1.015 1.005 - 1.030   pH, UA 6.0 5.0 - 7.5   Color, UA Yellow Yellow   Appearance Ur Clear Clear   Leukocytes,UA 1+ (A) Negative   Protein,UA Trace (A) Negative/Trace   Glucose, UA Negative Negative   Ketones, UA Negative Negative   RBC, UA Negative Negative   Bilirubin, UA Negative Negative   Urobilinogen, Ur 0.2 0.2 - 1.0 mg/dL   Nitrite, UA Negative Negative   Microscopic Examination See below:    Assessment & Plan:   1. Dysuria UA today appears contaminated, though there is some pyuria and bacteriuria.  Will start empiric Cipro and send for culture for further evaluation.  We discussed the pyuria could be an anticipated finding following TURBT.  If her urine culture is negative, would recommend switching her to Uribel long-term to help with her bladder discomfort. - Urinalysis, Complete - CULTURE, URINE COMPREHENSIVE - ciprofloxacin (CIPRO) 250 MG tablet; Take 1 tablet (250 mg total) by mouth 2 (two) times daily for 5 days.  Dispense: 10 tablet; Refill: 0   Return if symptoms worsen or fail to improve.  Carman Ching, PA-C  [facility] [place] [address] [place], [place] [address] [phone]

## 2023-09-16 LAB — CULTURE, URINE COMPREHENSIVE

## 2023-09-19 ENCOUNTER — Other Ambulatory Visit: Payer: Self-pay | Admitting: Primary Care

## 2023-09-19 DIAGNOSIS — E1165 Type 2 diabetes mellitus with hyperglycemia: Secondary | ICD-10-CM

## 2023-09-19 MED ORDER — URIBEL 118 MG PO CAPS: 1.0000 | ORAL_CAPSULE | Freq: Four times a day (QID) | ORAL | 3 refills | Status: DC | PRN

## 2023-09-19 NOTE — Telephone Encounter (Signed)
Prescription Request  09/19/2023  LOV: 06/14/2023  What is the name of the medication or equipment? Insulin Pen Needle (BD PEN NEEDLE MICRO U/F) 32G X 6 MM MISC & Insulin Glargine-Lixisenatide (SOLIQUA) 100-33 UNT-MCG/ML SOPN   Have you contacted your pharmacy to request a refill? No   Which pharmacy would you like this sent to?  [facility] - [place], [place] - [address]     Patient notified that their request is being sent to the clinical staff for review and that they should receive a response within 2 business days.   Please advise at Mobile [phone] (mobile)

## 2023-09-19 NOTE — Addendum Note (Signed)
Addended by: Debarah Crape on: 09/19/2023 05:43 PM   Modules accepted: Orders

## 2023-09-19 NOTE — Telephone Encounter (Signed)
It looks like she has refills on file for both the medication and pen needles. Have her call the pharmacy for refills.

## 2023-09-20 ENCOUNTER — Other Ambulatory Visit: Payer: Self-pay | Admitting: Primary Care

## 2023-09-20 DIAGNOSIS — E1165 Type 2 diabetes mellitus with hyperglycemia: Secondary | ICD-10-CM

## 2023-09-20 MED ORDER — BD PEN NEEDLE MICRO U/F 32G X 6 MM MISC: 0 refills | Status: DC

## 2023-09-20 MED ORDER — SOLIQUA 100-33 UNT-MCG/ML ~~LOC~~ SOPN: 60.0000 [IU] | PEN_INJECTOR | Freq: Every day | SUBCUTANEOUS | 0 refills | Status: DC

## 2023-09-20 NOTE — Telephone Encounter (Signed)
Copied from CRM [id number]. Topic: Clinical - Medication Refill >> Sep 20, 2023 11:54 AM Isabell A wrote: Most Recent Primary Care Visit:  Provider: Doreene Nest  Department: [facility]  Visit Type: PHYSICAL  Date: 06/14/2023  Medication: Insulin Glargine-Lixisenatide (SOLIQUA) 100-33 UNT-MCG/ML SOPN    Insulin Pen Needle (BD PEN NEEDLE MICRO U/F) 32G X 6 MM MISC   Has the patient contacted their pharmacy? Yes (Agent: If no, request that the patient contact the pharmacy for the refill. If patient does not wish to contact the pharmacy document the reason why and proceed with request.) (Agent: If yes, when and what did the pharmacy advise?)  Is this the correct pharmacy for this prescription? Yes This is the patient's preferred pharmacy:   [facility] - [place], [place] - [address] [place] [address] Phone: [phone] Fax: [phone]   Has the prescription been filled recently? Yes  Is the patient out of the medication? Yes  Has the patient been seen for an appointment in the last year OR does the patient have an upcoming appointment? Yes  Can we respond through MyChart? No  Agent: Please be advised that Rx refills may take up to 3 business days. We ask that you follow-up with your pharmacy.

## 2023-09-20 NOTE — Telephone Encounter (Signed)
Called and spoke with patient, she states she is only able to get one box of insulin at a time at [facility] because of stocking issue and due to her living between [facility] and [place] she has found it difficult to get to the pharmacy in time to pick up another box. She is wanting to switch her insulin and insulin needles to [facility].

## 2023-09-20 NOTE — Telephone Encounter (Signed)
Duplicate request, see other refill encounter.

## 2023-09-20 NOTE — Telephone Encounter (Signed)
Noted, Rxs sent to [facility] pharmacy

## 2023-09-21 DIAGNOSIS — M797 Fibromyalgia: Secondary | ICD-10-CM | POA: Diagnosis not present

## 2023-09-21 DIAGNOSIS — M5412 Radiculopathy, cervical region: Secondary | ICD-10-CM | POA: Diagnosis not present

## 2023-09-21 DIAGNOSIS — R519 Headache, unspecified: Secondary | ICD-10-CM | POA: Diagnosis not present

## 2023-10-18 ENCOUNTER — Other Ambulatory Visit: Payer: Self-pay | Admitting: Primary Care

## 2023-10-18 DIAGNOSIS — R519 Headache, unspecified: Secondary | ICD-10-CM

## 2023-10-19 DIAGNOSIS — D224 Melanocytic nevi of scalp and neck: Secondary | ICD-10-CM | POA: Diagnosis not present

## 2023-10-19 DIAGNOSIS — L718 Other rosacea: Secondary | ICD-10-CM | POA: Diagnosis not present

## 2023-10-19 DIAGNOSIS — L821 Other seborrheic keratosis: Secondary | ICD-10-CM | POA: Diagnosis not present

## 2023-10-19 DIAGNOSIS — D225 Melanocytic nevi of trunk: Secondary | ICD-10-CM | POA: Diagnosis not present

## 2023-10-19 DIAGNOSIS — L853 Xerosis cutis: Secondary | ICD-10-CM | POA: Diagnosis not present

## 2023-10-19 DIAGNOSIS — L820 Inflamed seborrheic keratosis: Secondary | ICD-10-CM | POA: Diagnosis not present

## 2023-11-03 ENCOUNTER — Encounter: Payer: Self-pay | Admitting: Internal Medicine

## 2023-11-23 ENCOUNTER — Other Ambulatory Visit: Payer: Self-pay | Admitting: Primary Care

## 2023-11-23 DIAGNOSIS — E1165 Type 2 diabetes mellitus with hyperglycemia: Secondary | ICD-10-CM

## 2023-11-27 DIAGNOSIS — M797 Fibromyalgia: Secondary | ICD-10-CM | POA: Diagnosis not present

## 2023-11-27 DIAGNOSIS — M5382 Other specified dorsopathies, cervical region: Secondary | ICD-10-CM | POA: Diagnosis not present

## 2023-11-27 DIAGNOSIS — R519 Headache, unspecified: Secondary | ICD-10-CM | POA: Diagnosis not present

## 2023-11-28 ENCOUNTER — Encounter: Payer: Self-pay | Admitting: Oncology

## 2023-12-01 ENCOUNTER — Telehealth: Payer: Self-pay

## 2023-12-01 ENCOUNTER — Inpatient Hospital Stay: Payer: Medicare HMO | Attending: Oncology

## 2023-12-01 NOTE — Telephone Encounter (Signed)
 Multiple attempts made to reach pt for PV apt. VM left. Pt to r/s PV by 5 PM today to avoid cancellation of her upcoming colonoscopy with Dr. Leone Payor.

## 2023-12-03 ENCOUNTER — Other Ambulatory Visit: Payer: Self-pay | Admitting: Primary Care

## 2023-12-03 DIAGNOSIS — E1165 Type 2 diabetes mellitus with hyperglycemia: Secondary | ICD-10-CM

## 2023-12-06 ENCOUNTER — Ambulatory Visit (AMBULATORY_SURGERY_CENTER): Payer: Medicare HMO

## 2023-12-06 ENCOUNTER — Inpatient Hospital Stay: Payer: Medicare HMO | Admitting: Oncology

## 2023-12-06 ENCOUNTER — Telehealth: Payer: Self-pay

## 2023-12-06 ENCOUNTER — Inpatient Hospital Stay: Payer: Medicare HMO

## 2023-12-06 VITALS — Ht 66.0 in | Wt 260.0 lb

## 2023-12-06 DIAGNOSIS — Z860100 Personal history of colon polyps, unspecified: Secondary | ICD-10-CM

## 2023-12-06 NOTE — Telephone Encounter (Signed)
 Pv in progress

## 2023-12-06 NOTE — Progress Notes (Signed)
 No egg or soy allergy known to patient  No issues known to pt with past sedation with any surgeries or procedures Patient denies ever being told they had issues or difficulty with intubation  No FH of Malignant Hyperthermia Pt is not on diet pills Pt is not on  home 02  Pt is not on blood thinners  Pt denies issues with constipation  No A fib or A flutter Have any cardiac testing pending-- no  LOA: independent  Prep: split dose miralax  Patient's chart reviewed by Cathlyn Parsons CNRA prior to previsit and patient appropriate for the [facility].  Previsit completed and red dot placed by patient's name on their procedure day (on provider's schedule).     PV completed with patient. Prep instructions sent via mychart and home address.

## 2023-12-11 ENCOUNTER — Telehealth: Payer: Self-pay | Admitting: Internal Medicine

## 2023-12-11 NOTE — Telephone Encounter (Signed)
 Spoke with patient, would like instructions sent to her.

## 2023-12-12 ENCOUNTER — Encounter: Payer: Self-pay | Admitting: Internal Medicine

## 2023-12-13 ENCOUNTER — Ambulatory Visit: Payer: Medicare HMO | Admitting: Primary Care

## 2023-12-15 ENCOUNTER — Other Ambulatory Visit: Payer: Self-pay | Admitting: Urology

## 2023-12-17 NOTE — Progress Notes (Unsigned)
 [facility] History and Physical   Primary Care Physician:  Doreene Nest, NP   Reason for Procedure:  History of adenomatous colon polyps  Plan:    Colonoscopy     HPI: Danielle Edwards is a 68 y.o. female status post multiple colonoscopies with a history of polyps, most recently October 2021 with 5 adenomatous polyps removed by Dr. Orvan Falconer filling in for me.  Prior to that other polyps as outlined below.  An elderly mother (67s) was diagnosed with colon cancer in her brother has a history of colon polyps.  2001 - diminutive adenoma [place] 2007 no polyps - [place] 08/2015 3 subcm adenomas  07/2020 5 adenomas Past Medical History:  Diagnosis Date   Allergy    seasonal allergies   Anemia    hx of IDA   Anxiety    on meds   Arthritis    PRN meds-osteoarthritis   Asthma    Cataract    sx to remove   Chronic kidney disease    CKD-stage 3   Chronic pain    COVID-19 06/02/2020   Depression    on meds   Diabetes type 2, controlled (HCC)    on meds   Fibromyalgia    GERD (gastroesophageal reflux disease)    on meds   Headache    History of colonic polyps 1999   Benign   Hx of adenomatous polyp of colon 09/27/2000   2001 - diminutive adenoma [place] 2007 no polyps - [place]   Hyperlipidemia    on meds   Hypertension    on meds   Hypothyroidism    not on meds at this time   IBS (irritable bowel syndrome)    Diarrhea type   IDA (iron deficiency anemia) 02/09/2022   Neuromuscular disorder (HCC)    Sleep apnea    never tested but cant lay flat   Vitamin D deficiency     Past Surgical History:  Procedure Laterality Date   CHOLECYSTECTOMY  1995   COLONOSCOPY  multiple   COLONOSCOPY  2016   TA's   EYE SURGERY Bilateral    cataracts   NASAL SINUS SURGERY  2001   POLYPECTOMY     TA's   TONSILLECTOMY     TONSILLECTOMY AND ADENOIDECTOMY     TRANSURETHRAL RESECTION OF BLADDER TUMOR N/A 08/08/2023   Procedure: TRANSURETHRAL RESECTION OF  BLADDER TUMOR (TURBT) w/ INSTILLATION OF GEMCITABINE;  Surgeon: Riki Altes, MD;  Location: [facility] ORS;  Service: Urology;  Laterality: N/A;   VAGINAL HYSTERECTOMY     Complete   WISDOM TOOTH EXTRACTION      Prior to Admission medications   Medication Sig Start Date End Date Taking? Authorizing Provider  Accu-Chek FastClix Lancets MISC USE AS INSTRUCTED TO CHECK BLOOD SUGAR UP TO 3 TIMES DAILY Patient not taking: Reported on 07/26/2023 10/22/19   Doreene Nest, NP  amLODipine (NORVASC) 10 MG tablet Take 1 tablet (10 mg total) by mouth daily. for blood pressure. 04/19/23   Doreene Nest, NP  atorvastatin (LIPITOR) 40 MG tablet TAKE ONE TABLET BY MOUTH AT BEDTIME FOR CHOLESTEROL Patient not taking: Reported on 12/06/2023 11/25/21   Doreene Nest, NP  Cholecalciferol (VITAMIN D3) 125 MCG (5000 UT) CAPS Take 5,000 Units by mouth daily.    [provider]  DROPLET PEN NEEDLES 32G X 6 MM MISC USE ONCE DAILY TO INJECT INSULIN. 12/04/23   Doreene Nest, NP  DULoxetine (CYMBALTA) 60 MG capsule Take 1 capsule (  60 mg total) by mouth daily. for anxiety and depression. Patient not taking: Reported on 12/06/2023 03/10/23   Doreene Nest, NP  fluconazole (DIFLUCAN) 100 MG tablet Take 1 tablet (100 mg total) by mouth daily. Patient not taking: Reported on 12/06/2023 08/14/23   Riki Altes, MD  glucose blood (ACCU-CHEK GUIDE) test strip USE UP TO FOUR TIMES DAILY TO CHECK BLOOD SUGAR (E10.9 E11.9) Patient not taking: Reported on 07/26/2023 12/01/21   Doreene Nest, NP  ibuprofen (ADVIL) 800 MG tablet Take 800 mg by mouth daily as needed (pain.). 07/25/23   [provider]  Insulin Glargine-Lixisenatide (SOLIQUA) 100-33 UNT-MCG/ML SOPN INJECT 60 UNITS INTO THE SKIN DAILY. FOR DIABETES. 11/23/23   Doreene Nest, NP  iron sucrose in sodium chloride 0.9 % 100 mL Inject into the vein as needed.    [provider]  IRON, FERROUS SULFATE, PO Take 1 tablet by  mouth in the morning.    [provider]  loperamide (IMODIUM A-D) 2 MG tablet Take 2 mg by mouth 4 (four) times daily as needed for diarrhea or loose stools (ibs symptoms.).    [provider]  losartan (COZAAR) 100 MG tablet Take 1 tablet (100 mg total) by mouth daily. for blood pressure. 04/19/23   Doreene Nest, NP  MAGNESIUM PO Take 1 tablet by mouth daily.    [provider]  metFORMIN (GLUCOPHAGE-XR) 500 MG 24 hr tablet Take 4 tablets (2,000 mg total) by mouth daily with breakfast. for diabetes. 08/25/23   Doreene Nest, NP  Meth-Hyo-M Salley Hews Phos-Ph Sal (URIBEL) 118 MG CAPS Take 1 capsule (118 mg total) by mouth 4 (four) times daily as needed. Patient not taking: Reported on 12/06/2023 09/19/23   Carman Ching, PA-C  Multiple Vitamins-Minerals (CENTRUM SILVER ULTRA WOMENS PO) Take 1 tablet by mouth daily at 6 (six) AM.    [provider]  oxybutynin (DITROPAN) 5 MG tablet 1 tab bid prn frequency,urgency, bladder spasm Patient not taking: Reported on 12/06/2023 08/08/23   Riki Altes, MD  oxyCODONE (OXY IR/ROXICODONE) 5 MG immediate release tablet 1-2 tablets every 4-6 hr as needed; #225 per month 04/09/21   [provider]  pantoprazole (PROTONIX) 40 MG tablet Take 40 mg by mouth daily as needed (indigestion/heartburn). 09/01/20   [provider]  phenazopyridine (PYRIDIUM) 200 MG tablet Take 1 tablet (200 mg total) by mouth 3 (three) times daily as needed (burning with urination). Patient not taking: Reported on 12/06/2023 08/08/23   Riki Altes, MD  promethazine (PHENERGAN) 25 MG tablet Take 25 mg by mouth every 6 (six) hours as needed for nausea or vomiting. Reported on 09/21/2015 07/03/15   [provider]  propranolol ER (INDERAL LA) 120 MG 24 hr capsule TAKE ONE CAPSULE (120 MG TOTAL) BY MOUTH AT BEDTIME. FOR HEADACHE PREVENTION Patient taking differently: Take 120 mg by mouth daily as needed. 10/18/23    Doreene Nest, NP    Current Outpatient Medications  Medication Sig Dispense Refill   amLODipine (NORVASC) 10 MG tablet Take 1 tablet (10 mg total) by mouth daily. for blood pressure. 90 tablet 2   Cholecalciferol (VITAMIN D3) 125 MCG (5000 UT) CAPS Take 5,000 Units by mouth daily.     DROPLET PEN NEEDLES 32G X 6 MM MISC USE ONCE DAILY TO INJECT INSULIN. 100 each 3   Insulin Glargine-Lixisenatide (SOLIQUA) 100-33 UNT-MCG/ML SOPN INJECT 60 UNITS INTO THE SKIN DAILY. FOR DIABETES. 45 mL 0   loperamide (  IMODIUM A-D) 2 MG tablet Take 2 mg by mouth 4 (four) times daily as needed for diarrhea or loose stools (ibs symptoms.).     losartan (COZAAR) 100 MG tablet Take 1 tablet (100 mg total) by mouth daily. for blood pressure. 90 tablet 2   MAGNESIUM PO Take 1 tablet by mouth daily.     metFORMIN (GLUCOPHAGE-XR) 500 MG 24 hr tablet Take 4 tablets (2,000 mg total) by mouth daily with breakfast. for diabetes. 360 tablet 1   Multiple Vitamins-Minerals (CENTRUM SILVER ULTRA WOMENS PO) Take 1 tablet by mouth daily at 6 (six) AM.     promethazine (PHENERGAN) 25 MG tablet Take 25 mg by mouth every 6 (six) hours as needed for nausea or vomiting. Reported on 09/21/2015  1   propranolol ER (INDERAL LA) 120 MG 24 hr capsule TAKE ONE CAPSULE (120 MG TOTAL) BY MOUTH AT BEDTIME. FOR HEADACHE PREVENTION (Patient taking differently: Take 120 mg by mouth daily as needed.) 90 capsule 0   Accu-Chek FastClix Lancets MISC USE AS INSTRUCTED TO CHECK BLOOD SUGAR UP TO 3 TIMES DAILY (Patient not taking: No sig reported) 306 each 1   atorvastatin (LIPITOR) 40 MG tablet TAKE ONE TABLET BY MOUTH AT BEDTIME FOR CHOLESTEROL (Patient not taking: Reported on 12/06/2023) 90 tablet 3   DULoxetine (CYMBALTA) 60 MG capsule Take 1 capsule (60 mg total) by mouth daily. for anxiety and depression. (Patient not taking: Reported on 12/06/2023) 90 capsule 2   fluconazole (DIFLUCAN) 100 MG tablet Take 1 tablet (100 mg total) by mouth daily.  (Patient not taking: Reported on 12/18/2023) 1 tablet 0   glucose blood (ACCU-CHEK GUIDE) test strip USE UP TO FOUR TIMES DAILY TO CHECK BLOOD SUGAR (E10.9 E11.9) (Patient not taking: No sig reported) 200 each 5   ibuprofen (ADVIL) 800 MG tablet Take 800 mg by mouth daily as needed (pain.).     iron sucrose in sodium chloride 0.9 % 100 mL Inject into the vein as needed.     IRON, FERROUS SULFATE, PO Take 1 tablet by mouth in the morning.     Meth-Hyo-M Bl-Na Phos-Ph Sal (URIBEL) 118 MG CAPS Take 1 capsule (118 mg total) by mouth 4 (four) times daily as needed. (Patient not taking: Reported on 12/18/2023) 30 capsule 3   oxybutynin (DITROPAN) 5 MG tablet 1 tab bid prn frequency,urgency, bladder spasm (Patient not taking: Reported on 12/18/2023) 10 tablet 0   oxyCODONE (OXY IR/ROXICODONE) 5 MG immediate release tablet 1-2 tablets every 4-6 hr as needed; #225 per month     pantoprazole (PROTONIX) 40 MG tablet Take 40 mg by mouth daily as needed (indigestion/heartburn).     phenazopyridine (PYRIDIUM) 200 MG tablet Take 1 tablet (200 mg total) by mouth 3 (three) times daily as needed (burning with urination). (Patient not taking: Reported on 12/06/2023) 10 tablet 0   Current Facility-Administered Medications  Medication Dose Route Frequency Provider Last Rate Last Admin   0.9 %  sodium chloride infusion  500 mL Intravenous Continuous Iva Boop, MD        Allergies as of 12/18/2023   (No Known Allergies)    Family History  Problem Relation Age of Onset   Colon cancer Mother 12   Lung cancer Mother 61   Skin cancer Mother    Colon polyps Mother 54   Heart disease Father    Diabetes Father    Skin cancer Father    Colon polyps Brother 74   Stomach cancer Paternal Grandmother  80   Breast cancer Neg Hx    Esophageal cancer Neg Hx    Rectal cancer Neg Hx     Social History   Socioeconomic History   Marital status: Married    Spouse name: Vater,Jesse (Spouse)   Number of children: Not  on file   Years of education: Not on file   Highest education level: 12th grade  Occupational History   Not on file  Tobacco Use   Smoking status: Never   Smokeless tobacco: Never  Vaping Use   Vaping status: Never Used  Substance and Sexual Activity   Alcohol use: Yes    Alcohol/week: 0.0 standard drinks of alcohol    Comment: rarely   Drug use: No   Sexual activity: Not on file  Other Topics Concern   Not on file  Social History Narrative   Married.   3 children.   Retired, worked at [employer] in [place], [place].   Enjoys being with her grandchildren, being with her friends.   Social Drivers of Corporate investment banker Strain: Low Risk  (03/08/2023)   Overall Financial Resource Strain (CARDIA)    Difficulty of Paying Living Expenses: Not hard at all  Food Insecurity: No Food Insecurity (03/08/2023)   Hunger Vital Sign    Worried About Running Out of Food in the Last Year: Never true    Ran Out of Food in the Last Year: Never true  Transportation Needs: No Transportation Needs (03/08/2023)   PRAPARE - Administrator, Civil Service (Medical): No    Lack of Transportation (Non-Medical): No  Physical Activity: Inactive (03/08/2023)   Exercise Vital Sign    Days of Exercise per Week: 0 days    Minutes of Exercise per Session: 0 min  Stress: No Stress Concern Present (03/08/2023)   [employer] of Occupational Health - Occupational Stress Questionnaire    Feeling of Stress : Not at all  Recent Concern: Stress - Stress Concern Present (01/31/2023)   [employer] of Occupational Health - Occupational Stress Questionnaire    Feeling of Stress : To some extent  Social Connections: Socially Integrated (03/08/2023)   Social Connection and Isolation Panel [NHANES]    Frequency of Communication with Friends and Family: More than three times a week    Frequency of Social Gatherings with Friends and Family: More than three times a week    Attends Religious Services: More  than 4 times per year    Active Member of [employer] or Organizations: Yes    Attends Engineer, structural: More than 4 times per year    Marital Status: Married  Catering manager Violence: Not At Risk (03/08/2023)   Humiliation, Afraid, Rape, and Kick questionnaire    Fear of Current or Ex-Partner: No    Emotionally Abused: No    Physically Abused: No    Sexually Abused: No    Review of Systems:  All other review of systems negative except as mentioned in the HPI.  Physical Exam: Vital signs BP (!) 163/98   Pulse 83   Temp 97.7 F (36.5 C)   Ht 5\' 6"  (1.676 m)   Wt 260 lb (117.9 kg)   SpO2 97%   BMI 41.97 kg/m   General:   Alert,  Well-developed, well-nourished, pleasant and cooperative in NAD Lungs:  Clear throughout to auscultation.   Heart:  Regular rate and rhythm; no murmurs, clicks, rubs,  or gallops. Abdomen:  Soft, nontender and nondistended. Normal bowel  sounds.   Neuro/Psych:  Alert and cooperative. Normal mood and affect. A and O x 3   @Kemani Heidel  Sena Slate, MD, [facility] [phone] (pager) 12/18/2023 9:21 AM@

## 2023-12-18 ENCOUNTER — Encounter: Payer: Self-pay | Admitting: Internal Medicine

## 2023-12-18 ENCOUNTER — Ambulatory Visit (AMBULATORY_SURGERY_CENTER): Payer: Medicare HMO | Admitting: Internal Medicine

## 2023-12-18 VITALS — BP 132/62 | HR 85 | Temp 97.7°F | Resp 13 | Ht 66.0 in | Wt 260.0 lb

## 2023-12-18 DIAGNOSIS — Z1211 Encounter for screening for malignant neoplasm of colon: Secondary | ICD-10-CM | POA: Diagnosis not present

## 2023-12-18 DIAGNOSIS — Z860101 Personal history of adenomatous and serrated colon polyps: Secondary | ICD-10-CM | POA: Diagnosis not present

## 2023-12-18 DIAGNOSIS — K5730 Diverticulosis of large intestine without perforation or abscess without bleeding: Secondary | ICD-10-CM

## 2023-12-18 DIAGNOSIS — E119 Type 2 diabetes mellitus without complications: Secondary | ICD-10-CM | POA: Diagnosis not present

## 2023-12-18 DIAGNOSIS — F32A Depression, unspecified: Secondary | ICD-10-CM | POA: Diagnosis not present

## 2023-12-18 DIAGNOSIS — I10 Essential (primary) hypertension: Secondary | ICD-10-CM | POA: Diagnosis not present

## 2023-12-18 DIAGNOSIS — D125 Benign neoplasm of sigmoid colon: Secondary | ICD-10-CM

## 2023-12-18 DIAGNOSIS — Z860100 Personal history of colon polyps, unspecified: Secondary | ICD-10-CM

## 2023-12-18 DIAGNOSIS — D127 Benign neoplasm of rectosigmoid junction: Secondary | ICD-10-CM | POA: Diagnosis not present

## 2023-12-18 DIAGNOSIS — D128 Benign neoplasm of rectum: Secondary | ICD-10-CM

## 2023-12-18 DIAGNOSIS — F419 Anxiety disorder, unspecified: Secondary | ICD-10-CM | POA: Diagnosis not present

## 2023-12-18 MED ORDER — SODIUM CHLORIDE 0.9 % IV SOLN
500.0000 mL | INTRAVENOUS | Status: DC
Start: 2023-12-18 — End: 2023-12-18

## 2023-12-18 NOTE — Patient Instructions (Addendum)
 I found and removed 2 polyps today. One polyp was very low and you may see a little bit of bleeding.  I will let you know pathology results and when to have another routine colonoscopy by mail and/or My Chart.  I appreciate the opportunity to care for you. Iva Boop, MD, [facility]   **handout given on polys and diverticulosis**   YOU HAD AN ENDOSCOPIC PROCEDURE TODAY AT THE [facility]:   Refer to the procedure report that was given to you for any specific questions about what was found during the examination.  If the procedure report does not answer your questions, please call your gastroenterologist to clarify.  If you requested that your care partner not be given the details of your procedure findings, then the procedure report has been included in a sealed envelope for you to review at your convenience later.  YOU SHOULD EXPECT: Some feelings of bloating in the abdomen. Passage of more gas than usual.  Walking can help get rid of the air that was put into your GI tract during the procedure and reduce the bloating. If you had a lower endoscopy (such as a colonoscopy or flexible sigmoidoscopy) you may notice spotting of blood in your stool or on the toilet paper. If you underwent a bowel prep for your procedure, you may not have a normal bowel movement for a few days.  Please Note:  You might notice some irritation and congestion in your nose or some drainage.  This is from the oxygen used during your procedure.  There is no need for concern and it should clear up in a day or so.  SYMPTOMS TO REPORT IMMEDIATELY:  Following lower endoscopy (colonoscopy or flexible sigmoidoscopy):  Excessive amounts of blood in the stool  Significant tenderness or worsening of abdominal pains  Swelling of the abdomen that is new, acute  Fever of 100F or higher  For urgent or emergent issues, a gastroenterologist can be reached at any hour by calling [phone]. Do not use MyChart  messaging for urgent concerns.    DIET:  We do recommend a small meal at first, but then you may proceed to your regular diet.  Drink plenty of fluids but you should avoid alcoholic beverages for 24 hours.  ACTIVITY:  You should plan to take it easy for the rest of today and you should NOT DRIVE or use heavy machinery until tomorrow (because of the sedation medicines used during the test).    FOLLOW UP: Our staff will call the number listed on your records the next business day following your procedure.  We will call around 7:15- 8:00 am to check on you and address any questions or concerns that you may have regarding the information given to you following your procedure. If we do not reach you, we will leave a message.     If any biopsies were taken you will be contacted by phone or by letter within the next 1-3 weeks.  Please call us at [phone] if you have not heard about the biopsies in 3 weeks.    SIGNATURES/CONFIDENTIALITY: You and/or your care partner have signed paperwork which will be entered into your electronic medical record.  These signatures attest to the fact that that the information above on your After Visit Summary has been reviewed and is understood.  Full responsibility of the confidentiality of this discharge information lies with you and/or your care-partner.

## 2023-12-18 NOTE — Op Note (Signed)
 [facility] Patient Name: Danielle Edwards Procedure Date: 12/18/2023 9:13 AM MRN: [id number] Endoscopist: Iva Boop , MD, [id number] Age: 68 Referring MD:  Date of Birth: 01-Sep-1956 Gender: Female Account #: [id number] Procedure:                Colonoscopy Indications:              Surveillance: Personal history of adenomatous                            polyps on last colonoscopy > 3 years ago, Last                            colonoscopy: October 2021 Medicines:                Monitored Anesthesia Care Procedure:                Pre-Anesthesia Assessment:                           - Prior to the procedure, a History and Physical                            was performed, and patient medications and                            allergies were reviewed. The patient's tolerance of                            previous anesthesia was also reviewed. The risks                            and benefits of the procedure and the sedation                            options and risks were discussed with the patient.                            All questions were answered, and informed consent                            was obtained. Prior Anticoagulants: The patient has                            taken no anticoagulant or antiplatelet agents. ASA                            Grade Assessment: III - A patient with severe                            systemic disease. After reviewing the risks and                            benefits, the patient was deemed in satisfactory  condition to undergo the procedure.                           After obtaining informed consent, the colonoscope                            was passed under direct vision. Throughout the                            procedure, the patient's blood pressure, pulse, and                            oxygen saturations were monitored continuously. The                            [id number]-HQ190L Colonoscope [id number] was  introduced                            through the anus and advanced to the the cecum,                            identified by appendiceal orifice and ileocecal                            valve. The colonoscopy was performed without                            difficulty. The patient tolerated the procedure                            well. The quality of the bowel preparation was                            good. The ileocecal valve, appendiceal orifice, and                            rectum were photographed. The bowel preparation                            used was Miralax via split dose instruction. Scope In: 9:58:55 AM Scope Out: 10:16:55 AM Scope Withdrawal Time: 0 hours 14 minutes 13 seconds  Total Procedure Duration: 0 hours 18 minutes 0 seconds  Findings:                 The perianal and digital rectal examinations were                            normal.                           Two sessile polyps were found in the distal rectum                            and distal sigmoid colon. The polyps were 4 to 6 mm  in size. These polyps were removed with a cold                            snare. Resection and retrieval were complete.                            Verification of patient identification for the                            specimen was done. Estimated blood loss was minimal.                           Multiple small-mouthed diverticula were found in                            the sigmoid colon.                           The exam was otherwise without abnormality on                            direct and retroflexion views. Complications:            No immediate complications. Estimated Blood Loss:     Estimated blood loss was minimal. Impression:               - Two 4 to 6 mm polyps in the distal rectum and in                            the distal sigmoid colon, removed with a cold                            snare. Resected and retrieved.                            - Diverticulosis in the sigmoid colon.                           - The examination was otherwise normal on direct                            and retroflexion views.                           - Personal history of colonic polyps. 2001 -                            diminutive adenoma                           2007 no polyps                           08/2015 3 subcm adenomas  07/2020 5 adenomas Recommendation:           - Patient has a contact number available for                            emergencies. The signs and symptoms of potential                            delayed complications were discussed with the                            patient. Return to normal activities tomorrow.                            Written discharge instructions were provided to the                            patient.                           - Resume previous diet.                           - Continue present medications.                           - Repeat colonoscopy is recommended for                            surveillance. The colonoscopy date will be                            determined after pathology results from today's                            exam become available for review. Iva Boop, MD 12/18/2023 10:29:31 AM This report has been signed electronically.

## 2023-12-18 NOTE — Progress Notes (Signed)
 Pt resting comfortably. VSS. Airway intact. SBAR complete to RN. All questions answered.

## 2023-12-18 NOTE — Progress Notes (Signed)
 Called to room to assist during endoscopic procedure.  Patient ID and intended procedure confirmed with present staff. Received instructions for my participation in the procedure from the performing physician.

## 2023-12-18 NOTE — Progress Notes (Signed)
Pt's states no medical or surgical changes since previsit or office visit. VS by DT. °

## 2023-12-19 ENCOUNTER — Telehealth: Payer: Self-pay

## 2023-12-19 NOTE — Telephone Encounter (Signed)
 Post procedure follow up call, no answer

## 2023-12-20 ENCOUNTER — Ambulatory Visit (INDEPENDENT_AMBULATORY_CARE_PROVIDER_SITE_OTHER): Payer: Medicare HMO | Admitting: Primary Care

## 2023-12-20 ENCOUNTER — Encounter: Payer: Self-pay | Admitting: Primary Care

## 2023-12-20 VITALS — BP 126/80 | HR 74 | Temp 97.4°F | Ht 66.0 in | Wt 263.0 lb

## 2023-12-20 DIAGNOSIS — Z794 Long term (current) use of insulin: Secondary | ICD-10-CM

## 2023-12-20 DIAGNOSIS — E1165 Type 2 diabetes mellitus with hyperglycemia: Secondary | ICD-10-CM | POA: Diagnosis not present

## 2023-12-20 DIAGNOSIS — R300 Dysuria: Secondary | ICD-10-CM

## 2023-12-20 DIAGNOSIS — M79675 Pain in left toe(s): Secondary | ICD-10-CM | POA: Diagnosis not present

## 2023-12-20 LAB — POC URINALSYSI DIPSTICK (AUTOMATED)
Bilirubin, UA: NEGATIVE
Blood, UA: NEGATIVE
Glucose, UA: NEGATIVE
Ketones, UA: NEGATIVE
Leukocytes, UA: NEGATIVE
Nitrite, UA: NEGATIVE
Protein, UA: NEGATIVE
Spec Grav, UA: 1.01 (ref 1.010–1.025)
Urobilinogen, UA: 0.2 U/dL — AB
pH, UA: 5.5 (ref 5.0–8.0)

## 2023-12-20 LAB — POCT GLYCOSYLATED HEMOGLOBIN (HGB A1C): Hemoglobin A1C: 6.9 % — AB (ref 4.0–5.6)

## 2023-12-20 LAB — URIC ACID: Uric Acid, Serum: 7.4 mg/dL — ABNORMAL HIGH (ref 2.4–7.0)

## 2023-12-20 LAB — MICROALBUMIN / CREATININE URINE RATIO
Creatinine,U: 43 mg/dL
Microalb Creat Ratio: UNDETERMINED mg/g (ref 0.0–30.0)
Microalb, Ur: <0.7 mg/dL

## 2023-12-20 LAB — SURGICAL PATHOLOGY

## 2023-12-20 MED ORDER — BLOOD GLUCOSE MONITORING SUPPL DEVI: 1.0000 | Freq: Three times a day (TID) | 0 refills | Status: AC

## 2023-12-20 MED ORDER — BLOOD GLUCOSE TEST VI STRP: 1.0000 | ORAL_STRIP | Freq: Three times a day (TID) | 0 refills | Status: AC

## 2023-12-20 MED ORDER — LANCET DEVICE MISC: 1.0000 | Freq: Three times a day (TID) | 0 refills | Status: AC

## 2023-12-20 MED ORDER — LANCETS MISC. MISC: 1.0000 | Freq: Three times a day (TID) | 0 refills | Status: AC

## 2023-12-20 NOTE — Progress Notes (Signed)
 Subjective:    Patient ID: Danielle Edwards, female    DOB: 07-05-1956, 68 y.o.   MRN: [id number]  HPI  Danielle Edwards is a very pleasant 68 y.o. female with a history of pretension, type 2 diabetes, CKD, hypothyroidism, cystitis, iron deficiency anemia, low grade noninvasive carcinoma of bladder s/p TURBT in 2024 who presents today for follow up of diabetes. She would also like to discuss urinary urgency.  1) Type 2 Diabetes: Current medications include: Soliqua 100-33, 60 units daily. She would like to try Ozempic or Mounjaro for weight loss benefit.   She is checking her blood glucose continuously and is in range:  Last 7 days: 82% Last 30 days: 81% Last 90 days: 85%  Last A1C:7.4 in September 2024 Last Eye Exam: UTD Last Foot Exam: Due Pneumonia Vaccination: 2022 Urine Microalbumin: Due Statin: atorvastatin   Dietary changes since last visit: She is working to decrease carbs and breads in her diet. Her sleep pattern is irregular.    Exercise: None.    2) History of Bladder Cancer/Urinary Urgency: Chronic history of urinary urgency and frequency since bladder cancer. Follows with Urology, evaluated and treated with Cipro in December 2024. She feels the same today. She has an appointment scheduled for next week.   3) Great Toe Pain: Chronic to the left great toe, intermittent for the last 1 year. She's unsure if she has gout. She had a uric acid level of 7.7 in August 2024 per nephrology.   Review of Systems  Respiratory:  Negative for shortness of breath.   Cardiovascular:  Negative for chest pain.  Gastrointestinal:  Negative for abdominal pain.  Genitourinary:  Positive for frequency and urgency.  Musculoskeletal:  Positive for arthralgias.  Psychiatric/Behavioral:  The patient is nervous/anxious.          Past Medical History:  Diagnosis Date   Allergy    seasonal allergies   Anemia    hx of IDA   Anxiety    on meds   Arthritis    PRN meds-osteoarthritis    Asthma    Cataract    sx to remove   Chronic kidney disease    CKD-stage 3   Chronic pain    COVID-19 06/02/2020   Depression    on meds   Diabetes type 2, controlled (HCC)    on meds   Fibromyalgia    GERD (gastroesophageal reflux disease)    on meds   Headache    History of colonic polyps 1999   Benign   Hx of adenomatous polyp of colon 09/27/2000   2001 - diminutive adenoma [place] 2007 no polyps - [place]   Hyperlipidemia    on meds   Hypertension    on meds   Hypothyroidism    not on meds at this time   IBS (irritable bowel syndrome)    Diarrhea type   IDA (iron deficiency anemia) 02/09/2022   Neuromuscular disorder (HCC)    Sleep apnea    never tested but cant lay flat   Vitamin D deficiency     Social History   Socioeconomic History   Marital status: Married    Spouse name: Morning,Jesse (Spouse)   Number of children: Not on file   Years of education: Not on file   Highest education level: 12th grade  Occupational History   Not on file  Tobacco Use   Smoking status: Never   Smokeless tobacco: Never  Vaping Use   Vaping status: Never Used  Substance and Sexual Activity   Alcohol use: Yes    Alcohol/week: 0.0 standard drinks of alcohol    Comment: rarely   Drug use: No   Sexual activity: Not on file  Other Topics Concern   Not on file  Social History Narrative   Married.   3 children.   Retired, worked at [employer] in [place], [place].   Enjoys being with her grandchildren, being with her friends.   Social Drivers of Corporate investment banker Strain: Low Risk  (03/08/2023)   Overall Financial Resource Strain (CARDIA)    Difficulty of Paying Living Expenses: Not hard at all  Food Insecurity: No Food Insecurity (03/08/2023)   Hunger Vital Sign    Worried About Running Out of Food in the Last Year: Never true    Ran Out of Food in the Last Year: Never true  Transportation Needs: No Transportation Needs (03/08/2023)   PRAPARE - Doctor, general practice (Medical): No    Lack of Transportation (Non-Medical): No  Physical Activity: Inactive (03/08/2023)   Exercise Vital Sign    Days of Exercise per Week: 0 days    Minutes of Exercise per Session: 0 min  Stress: No Stress Concern Present (03/08/2023)   [employer] of Occupational Health - Occupational Stress Questionnaire    Feeling of Stress : Not at all  Recent Concern: Stress - Stress Concern Present (01/31/2023)   [employer] of Occupational Health - Occupational Stress Questionnaire    Feeling of Stress : To some extent  Social Connections: Socially Integrated (03/08/2023)   Social Connection and Isolation Panel [NHANES]    Frequency of Communication with Friends and Family: More than three times a week    Frequency of Social Gatherings with Friends and Family: More than three times a week    Attends Religious Services: More than 4 times per year    Active Member of [employer] or Organizations: Yes    Attends Engineer, structural: More than 4 times per year    Marital Status: Married  Catering manager Violence: Not At Risk (03/08/2023)   Humiliation, Afraid, Rape, and Kick questionnaire    Fear of Current or Ex-Partner: No    Emotionally Abused: No    Physically Abused: No    Sexually Abused: No    Past Surgical History:  Procedure Laterality Date   CHOLECYSTECTOMY  1995   COLONOSCOPY  multiple   COLONOSCOPY  2016   TA's   EYE SURGERY Bilateral    cataracts   NASAL SINUS SURGERY  2001   POLYPECTOMY     TA's   TONSILLECTOMY     TONSILLECTOMY AND ADENOIDECTOMY     TRANSURETHRAL RESECTION OF BLADDER TUMOR N/A 08/08/2023   Procedure: TRANSURETHRAL RESECTION OF BLADDER TUMOR (TURBT) w/ INSTILLATION OF GEMCITABINE;  Surgeon: Riki Altes, MD;  Location: [facility] ORS;  Service: Urology;  Laterality: N/A;   VAGINAL HYSTERECTOMY     Complete   WISDOM TOOTH EXTRACTION      Family History  Problem Relation Age of Onset   Colon cancer Mother 32   Lung  cancer Mother 69   Skin cancer Mother    Colon polyps Mother 51   Heart disease Father    Diabetes Father    Skin cancer Father    Colon polyps Brother 110   Stomach cancer Paternal Grandmother 27   Breast cancer Neg Hx    Esophageal cancer Neg Hx    Rectal cancer  Neg Hx     No Known Allergies  Current Outpatient Medications on File Prior to Visit  Medication Sig Dispense Refill   amLODipine (NORVASC) 10 MG tablet Take 1 tablet (10 mg total) by mouth daily. for blood pressure. 90 tablet 2   atorvastatin (LIPITOR) 40 MG tablet TAKE ONE TABLET BY MOUTH AT BEDTIME FOR CHOLESTEROL 90 tablet 3   Cholecalciferol (VITAMIN D3) 125 MCG (5000 UT) CAPS Take 5,000 Units by mouth daily.     DROPLET PEN NEEDLES 32G X 6 MM MISC USE ONCE DAILY TO INJECT INSULIN. 100 each 3   DULoxetine (CYMBALTA) 60 MG capsule Take 1 capsule (60 mg total) by mouth daily. for anxiety and depression. 90 capsule 2   ibuprofen (ADVIL) 800 MG tablet Take 800 mg by mouth daily as needed (pain.).     Insulin Glargine-Lixisenatide (SOLIQUA) 100-33 UNT-MCG/ML SOPN INJECT 60 UNITS INTO THE SKIN DAILY. FOR DIABETES. 45 mL 0   IRON, FERROUS SULFATE, PO Take 1 tablet by mouth in the morning.     loperamide (IMODIUM A-D) 2 MG tablet Take 2 mg by mouth 4 (four) times daily as needed for diarrhea or loose stools (ibs symptoms.).     losartan (COZAAR) 100 MG tablet Take 1 tablet (100 mg total) by mouth daily. for blood pressure. 90 tablet 2   MAGNESIUM PO Take 1 tablet by mouth daily.     metFORMIN (GLUCOPHAGE-XR) 500 MG 24 hr tablet Take 4 tablets (2,000 mg total) by mouth daily with breakfast. for diabetes. 360 tablet 1   Multiple Vitamins-Minerals (CENTRUM SILVER ULTRA WOMENS PO) Take 1 tablet by mouth daily at 6 (six) AM.     oxyCODONE (OXY IR/ROXICODONE) 5 MG immediate release tablet 1-2 tablets every 4-6 hr as needed; #225 per month     pantoprazole (PROTONIX) 40 MG tablet Take 40 mg by mouth daily as needed  (indigestion/heartburn).     promethazine (PHENERGAN) 25 MG tablet Take 25 mg by mouth every 6 (six) hours as needed for nausea or vomiting. Reported on 09/21/2015  1   propranolol ER (INDERAL LA) 120 MG 24 hr capsule TAKE ONE CAPSULE (120 MG TOTAL) BY MOUTH AT BEDTIME. FOR HEADACHE PREVENTION (Patient taking differently: Take 120 mg by mouth daily as needed.) 90 capsule 0   Accu-Chek FastClix Lancets MISC USE AS INSTRUCTED TO CHECK BLOOD SUGAR UP TO 3 TIMES DAILY (Patient not taking: No sig reported) 306 each 1   fluconazole (DIFLUCAN) 100 MG tablet Take 1 tablet (100 mg total) by mouth daily. (Patient not taking: Reported on 12/06/2023) 1 tablet 0   iron sucrose in sodium chloride 0.9 % 100 mL Inject into the vein as needed. (Patient not taking: Reported on 12/20/2023)     Meth-Hyo-M Bl-Na Phos-Ph Sal (URIBEL) 118 MG CAPS Take 1 capsule (118 mg total) by mouth 4 (four) times daily as needed. (Patient not taking: Reported on 12/20/2023) 30 capsule 3   oxybutynin (DITROPAN) 5 MG tablet 1 tab bid prn frequency,urgency, bladder spasm (Patient not taking: Reported on 12/06/2023) 10 tablet 0   phenazopyridine (PYRIDIUM) 200 MG tablet Take 1 tablet (200 mg total) by mouth 3 (three) times daily as needed (burning with urination). (Patient not taking: Reported on 12/20/2023) 10 tablet 0   No current facility-administered medications on file prior to visit.    BP 126/80   Pulse 74   Temp (!) 97.4 F (36.3 C) (Temporal)   Ht 5\' 6"  (1.676 m)   Wt 263 lb (119.3  kg)   SpO2 100%   BMI 42.45 kg/m  Objective:   Physical Exam Cardiovascular:     Rate and Rhythm: Normal rate and regular rhythm.  Pulmonary:     Effort: Pulmonary effort is normal.     Breath sounds: Normal breath sounds.  Musculoskeletal:     Cervical back: Neck supple.  Skin:    General: Skin is warm and dry.  Neurological:     Mental Status: She is alert and oriented to person, place, and time.  Psychiatric:        Mood and Affect: Mood  normal.           Assessment & Plan:  Type 2 diabetes mellitus with hyperglycemia, with long-term current use of insulin (HCC) Assessment & Plan: Improved with A1c 6.9 today   Continue metformin 2000mg  once daily Continue Soliqua 60U once daily  Continue statin  Continue diet and lifestyle modifications  Foot exam - completed Microalbumin/creatinine ratio, urine - ordered, pending   Continue BS monitoring with CGM Glucometer and supplies ordered per request  Discussed medication alternatives of Ozempic and Moujaro. She will check with insurance on coverage  Follow up in 3 months for annual physical and labs.   I evaluated patient, was consulted regarding treatment, and agree with assessment and plan per Julaine Fusi, MSN, FNP student.   Mayra Reel, NP-C    Orders: -     POCT glycosylated hemoglobin (Hb A1C) -     Microalbumin / creatinine urine ratio -     Blood Glucose Monitoring Suppl; 1 each by Does not apply route in the morning, at noon, and at bedtime. May substitute to any manufacturer covered by patient's insurance.  Dispense: 1 each; Refill: 0 -     Blood Glucose Test; 1 each by In Vitro route in the morning, at noon, and at bedtime. May substitute to any manufacturer covered by patient's insurance.  Dispense: 300 strip; Refill: 0 -     Lancet Device; 1 each by Does not apply route in the morning, at noon, and at bedtime. May substitute to any manufacturer covered by patient's insurance.  Dispense: 1 each; Refill: 0 -     Lancets Misc.; 1 each by Does not apply route in the morning, at noon, and at bedtime. May substitute to any manufacturer covered by patient's insurance.  Dispense: 300 each; Refill: 0  Great toe pain, left Assessment & Plan: HPI and prior uric acid level suggestive of gout  Uric acid ordered, results pending   Will follow with recommendations based on results   I evaluated patient, was consulted regarding treatment, and agree with  assessment and plan per Julaine Fusi, MSN, FNP student.   Mayra Reel, NP-C   Orders: -     Uric acid  Dysuria Assessment & Plan: Persistent, not-improving  Following with urology Reviewed 09/2023 [facility]  POCT UA today negative for leukocytes, nitrites and blood   Suggested follow up with urology for symptoms and pharmacologic therapies   I evaluated patient, was consulted regarding treatment, and agree with assessment and plan per Julaine Fusi, MSN, FNP student.   Mayra Reel, NP-C   Orders: -     POCT Urinalysis Dipstick (Automated)        Doreene Nest, NP

## 2023-12-20 NOTE — Patient Instructions (Addendum)
 Medications: Continue current medications Check with insurance on options for GLP1 - ozempic, mounjaro   If glucometer and supplies are not cost-effective at [facility], please contact office and we can consider using [facility] where you get your CGM/supplies  Labs: Microalbumin/creatinine ratio, urine Uric acid  We will be in contact about results  Follow up: 3 month appointment for annual physical and labs  Schedule appointment with counselor at earliest convenience   Other: Contact urology about symptoms of pain and burning and as needed medications to take at home

## 2023-12-20 NOTE — Assessment & Plan Note (Addendum)
 HPI and prior uric acid level suggestive of gout  Uric acid ordered, results pending   Will follow with recommendations based on results   I evaluated patient, was consulted regarding treatment, and agree with assessment and plan per Julaine Fusi, MSN, FNP student.   Mayra Reel, NP-C

## 2023-12-20 NOTE — Assessment & Plan Note (Addendum)
 Improved with A1c 6.9 today   Continue metformin 2000mg  once daily Continue Soliqua 60U once daily  Continue statin  Continue diet and lifestyle modifications  Foot exam - completed Microalbumin/creatinine ratio, urine - ordered, pending   Continue BS monitoring with CGM Glucometer and supplies ordered per request  Discussed medication alternatives of Ozempic and Moujaro. She will check with insurance on coverage  Follow up in 3 months for annual physical and labs.   I evaluated patient, was consulted regarding treatment, and agree with assessment and plan per Julaine Fusi, MSN, FNP student.   Mayra Reel, NP-C

## 2023-12-20 NOTE — Progress Notes (Signed)
 Established Patient Office Visit  Subjective   Patient ID: Danielle Edwards, female    DOB: [birth year]  Age: 68 y.o. MRN: [id number]  Chief Complaint  Patient presents with   Medical Management of Chronic Issues    HPI  Danielle Edwards is a 68 year old female with history of type 2 diabetes mellitus, hypertension, stage 3 CKD, hypothyroidism, hyperlipidemia, vit D deficiency who presents for 6 month diabetes follow up.   Type 2 Diabetes Mellitus She denies polydipsia and polyphagia. Reports polyuria with frequency, urgency, burning. She is following with urology for this and also bladder cancer treatment. Notes occasional nausea, relieved with promethazine PRN, and occasional diarrhea, relieved with immodium PRN. Denies vision changes. Denies chest pain and shortness of breath. Denies numbness to lower extremities, does report tingling sensation.   Current medications include:  Metformin 2000mg  once daily Soliqua 60U once daily   She has CGM  Last 7 days - in range 82% Last 30 days - in range 81% Last 90 - in range 85% Hypoglycemia alerts - last >30 days Hypoglycemia treatment: crackers, Atkins drinks, yogurt   Last A1C: 7.4% (06/2023) Last Eye Exam: due, to be scheduled  Last Foot Exam: due today Pneumonia Vaccination: Prevnar 20 in 2022 Urine Microalbumin: due today Statin: atorvastatin 40mg    Dietary changes since last visit:  - working on decreasing carbs, breads in diet  Exercise: none current - would like to start a walking regimen - would like to work on weight loss  Dysuria  Persistent concern, for which she is also following with urology. Last visit 09/2023; treated for presumed UTI with ciprofloxacin due to pyuria and bacteruria. Unclear if she is taking Uribel, Ditropan, or Pyridium PRN.  Pain of left great toe Intermittent left great toe pain >1 year. Symptom onset without identifiable triggers. Described as tenseness. No trauma or injury. Uric acid level from 05/2023  was 7.7.    Review of Systems  Constitutional:  Negative for weight loss.  Eyes:  Negative for blurred vision and double vision.  Respiratory:  Negative for shortness of breath.   Cardiovascular:  Negative for chest pain and palpitations.  Gastrointestinal:  Positive for diarrhea, heartburn and nausea. Negative for constipation and vomiting.  Genitourinary:  Positive for dysuria and urgency.  Musculoskeletal:  Positive for joint pain.       Left great toe pain  Neurological:  Positive for tingling. Negative for dizziness.  Endo/Heme/Allergies:  Negative for polydipsia.      Objective:     BP 126/80   Pulse 74   Temp (!) 97.4 F (36.3 C) (Temporal)   Ht 5\' 6"  (1.676 m)   Wt 119.3 kg   SpO2 100%   BMI 42.45 kg/m    Physical Exam Constitutional:      General: She is not in acute distress.    Appearance: Normal appearance. She is obese.  Cardiovascular:     Rate and Rhythm: Normal rate and regular rhythm.     Pulses:          Radial pulses are 2+ on the right side and 2+ on the left side.       Dorsalis pedis pulses are 1+ on the right side and 1+ on the left side.       Posterior tibial pulses are 1+ on the right side and 1+ on the left side.     Heart sounds: Normal heart sounds.  Pulmonary:     Effort: Pulmonary effort is normal.  Breath sounds: Normal breath sounds.  Musculoskeletal:     Cervical back: Normal range of motion and neck supple.     Right lower leg: 1+ Pitting Edema present.     Left lower leg: 1+ Pitting Edema present.     Right foot: Normal range of motion. No deformity.     Left foot: Normal range of motion. No deformity.  Feet:     Right foot:     Skin integrity: No skin breakdown or erythema.     Toenail Condition: Right toenails are normal.     Left foot:     Skin integrity: No skin breakdown or erythema.     Toenail Condition: Left toenails are normal.  Skin:    General: Skin is warm and dry.  Neurological:     General: No focal  deficit present.     Mental Status: She is alert and oriented to person, place, and time.  Psychiatric:        Mood and Affect: Mood normal.        Behavior: Behavior normal.        Thought Content: Thought content normal.      Results for orders placed or performed in visit on 12/20/23  POCT glycosylated hemoglobin (Hb A1C)  Result Value Ref Range   Hemoglobin A1C 6.9 (A) 4.0 - 5.6 %   HbA1c POC (<> result, manual entry)     HbA1c, POC (prediabetic range)     HbA1c, POC (controlled diabetic range)    POCT Urinalysis Dipstick (Automated)  Result Value Ref Range   Color, UA yellow    Clarity, UA clear    Glucose, UA Negative Negative   Bilirubin, UA neg    Ketones, UA neg    Spec Grav, UA 1.010 1.010 - 1.025   Blood, UA neg    pH, UA 5.5 5.0 - 8.0   Protein, UA Negative Negative   Urobilinogen, UA 0.2 (A) 0.2 or 1.0 E.U./dL   Nitrite, UA neg    Leukocytes, UA Negative Negative      The 10-year ASCVD risk score (Arnett DK, et al., 2019) is: 18.4%    Assessment & Plan:   Problem List Items Addressed This Visit       Endocrine   Type 2 diabetes mellitus with hyperglycemia (HCC) - Primary   Improved with A1c 6.9 today   Continue metformin 2000mg  once daily Continue Soliqua 60U once daily  Continue statin  Continue diet and lifestyle modifications  Foot exam - completed Microalbumin/creatinine ratio, urine - ordered, pending   Continue BS monitoring with CGM Glucometer and supplies ordered per request  Discussed medication alternatives of Ozempic and Moujaro. She will check with insurance on coverage  Follow up in 3 months for annual physical and labs.        Relevant Medications   Blood Glucose Monitoring Suppl DEVI   Glucose Blood (BLOOD GLUCOSE TEST STRIPS) STRP   Lancet Device MISC   Lancets Misc. MISC   Other Relevant Orders   POCT glycosylated hemoglobin (Hb A1C) (Completed)   Microalbumin/Creatinine Ratio, Urine     Other   Dysuria    Persistent, not-improving  Following with urology Reviewed 09/2023 notes from [facility]  POCT UA today negative for leukocytes, nitrites and blood   Suggested follow up with urology for symptoms and pharmacologic therapies       Relevant Orders   POCT Urinalysis Dipstick (Automated) (Completed)   Great toe pain, left  HPI and prior uric acid level suggestive of gout  Uric acid ordered, results pending   Will follow with recommendations based on results       Relevant Orders   Uric acid    No follow-ups on file.   Annual physical in 3 months with routine labs at that time.   Lindell Spar, RN

## 2023-12-20 NOTE — Assessment & Plan Note (Addendum)
 Persistent, not-improving  Following with urology Reviewed 09/2023 notes from [facility]  POCT UA today negative for leukocytes, nitrites and blood   Suggested follow up with urology for symptoms and pharmacologic therapies   I evaluated patient, was consulted regarding treatment, and agree with assessment and plan per Julaine Fusi, MSN, FNP student.   Mayra Reel, NP-C

## 2023-12-21 ENCOUNTER — Other Ambulatory Visit: Payer: Self-pay | Admitting: Primary Care

## 2023-12-21 DIAGNOSIS — M1A9XX0 Chronic gout, unspecified, without tophus (tophi): Secondary | ICD-10-CM

## 2023-12-21 MED ORDER — COLCHICINE 0.6 MG PO TABS: ORAL_TABLET | ORAL | 0 refills | Status: DC

## 2023-12-25 ENCOUNTER — Ambulatory Visit: Payer: Self-pay

## 2023-12-25 NOTE — Telephone Encounter (Signed)
 Copied from CRM [id number]. Topic: Clinical - Lab/Test Results >> Dec 25, 2023 11:17 AM Drema Balzarine wrote: Reason for CRM: Patient has questions about her lab results, specifically the creatinine   Chief Complaint: Information Only   Pertinent Negatives: Patient denies symptoms.   Disposition: [] ED /[] Urgent Care (no appt availability in office) / [] Appointment(In office/virtual)/ []  [facility]/ [] Home Care/ [] Refused Recommended Disposition /[] [facility]/ [x]  Follow-up with PCP Additional Notes: Patient contacted via phone regarding concerns of her lab results. Discussed lab results, correlating information, and provided thorough education. Based on assessment, patient was advised to see Patient verbalized understanding and agreement with plan. Documentation provided.     Reason for Disposition . [1] Other NON-URGENT information for PCP AND [2] does not require PCP response  Answer Assessment - Initial Assessment Questions 1. REASON FOR CALL or QUESTION: "What is your reason for calling today?" or "How can I best help you?" or "What question do you have that I can help answer?"     Lab Results  Answer Assessment - Initial Assessment Questions 1. REASON FOR CALL or QUESTION: "What is your reason for calling today?" or "How can I best help you?" or "What question do you have that I can help answer?"     Microalbumin/Creatinine Ratio  2. CALLER: Document the source of call. (e.g., laboratory, patient).      Patient  Protocols used: Information Only Call - No Triage-A-AH, PCP Call - No Triage-A-AH

## 2023-12-25 NOTE — Telephone Encounter (Signed)
 Noted and appreciate the assistance with results.

## 2023-12-26 ENCOUNTER — Encounter: Payer: Self-pay | Admitting: Internal Medicine

## 2024-01-01 ENCOUNTER — Other Ambulatory Visit: Payer: Self-pay | Admitting: Primary Care

## 2024-01-01 DIAGNOSIS — I10 Essential (primary) hypertension: Secondary | ICD-10-CM

## 2024-01-03 ENCOUNTER — Encounter: Payer: Self-pay | Admitting: Urology

## 2024-01-03 ENCOUNTER — Ambulatory Visit (INDEPENDENT_AMBULATORY_CARE_PROVIDER_SITE_OTHER): Payer: Medicare HMO | Admitting: Urology

## 2024-01-03 VITALS — BP 189/75 | HR 101 | Ht 66.0 in | Wt 250.0 lb

## 2024-01-03 DIAGNOSIS — C679 Malignant neoplasm of bladder, unspecified: Secondary | ICD-10-CM | POA: Diagnosis not present

## 2024-01-03 DIAGNOSIS — R399 Unspecified symptoms and signs involving the genitourinary system: Secondary | ICD-10-CM

## 2024-01-03 DIAGNOSIS — N3941 Urge incontinence: Secondary | ICD-10-CM | POA: Diagnosis not present

## 2024-01-03 DIAGNOSIS — D494 Neoplasm of unspecified behavior of bladder: Secondary | ICD-10-CM | POA: Diagnosis not present

## 2024-01-03 DIAGNOSIS — Z8551 Personal history of malignant neoplasm of bladder: Secondary | ICD-10-CM | POA: Diagnosis not present

## 2024-01-03 LAB — MICROSCOPIC EXAMINATION: Bacteria, UA: NONE SEEN

## 2024-01-03 LAB — URINALYSIS, COMPLETE
Bilirubin, UA: NEGATIVE
Glucose, UA: NEGATIVE
Ketones, UA: NEGATIVE
Leukocytes,UA: NEGATIVE
Nitrite, UA: NEGATIVE
Protein,UA: NEGATIVE
RBC, UA: NEGATIVE
Specific Gravity, UA: 1.015 (ref 1.005–1.030)
Urobilinogen, Ur: 0.2 mg/dL (ref 0.2–1.0)
pH, UA: 5.5 (ref 5.0–7.5)

## 2024-01-03 MED ORDER — GEMTESA 75 MG PO TABS: 1.0000 | ORAL_TABLET | Freq: Once | ORAL | Status: AC

## 2024-01-03 NOTE — Progress Notes (Addendum)
   01/03/24  CC:  Chief Complaint  Patient presents with   Cysto   Urologic history: 1.  Urothelial carcinoma bladder Ta urothelial carcinoma bladder low-grade TURBT 08/08/2023 for a 1 cm papillary tumor anterior aspect left lateral wall; and 2 smaller tumors in this general vicinity.  Also noted to have a whitish nodular area just lateral to the 1 cm tumor Papillary tumors low-grade urothelial carcinoma and the nodular area returned urothelial dysplasia  HPI: Since her last visit she denies gross hematuria.  She complains of "bladder discomfort"  Blood pressure (!) 189/75, pulse (!) 101, height 5\' 6"  (1.676 m), weight 250 lb (113.4 kg). NED. A&Ox3.   No respiratory distress   Abd soft, NT, ND Normal external genitalia with patent urethral meatus  Cystoscopy Procedure Note  Patient identification was confirmed, informed consent was obtained, and patient was prepped using Betadine solution.  Lidocaine jelly was administered per urethral meatus.    Procedure: - Flexible cystoscope introduced, without any difficulty.   - Thorough search of the bladder revealed:    normal urethral meatus    normal urothelium    no stones    no ulcers     no tumors    no urethral polyps    no trabeculation  - Ureteral orifices were normal in position and appearance.  Post-Procedure: - Patient tolerated the procedure well  Assessment/ Plan: No evidence recurrent bladder tumor Trial Gemtesa 75 mg daily to see if this has any impact on her bladder symptoms.  Call back for Rx if improvement noted Surveillance cystoscopy 9 months    Riki Altes, MD

## 2024-01-10 ENCOUNTER — Ambulatory Visit
Admission: RE | Admit: 2024-01-10 | Discharge: 2024-01-10 | Disposition: A | Discharge status: Home / Self Care | Payer: Medicare HMO | Source: Ambulatory Visit | Attending: Primary Care | Admitting: Primary Care

## 2024-01-10 ENCOUNTER — Ambulatory Visit
Admission: RE | Admit: 2024-01-10 | Discharge: 2024-01-10 | Disposition: A | Discharge status: Home / Self Care | Payer: Medicare HMO | Source: Ambulatory Visit | Attending: Primary Care

## 2024-01-10 DIAGNOSIS — Z1231 Encounter for screening mammogram for malignant neoplasm of breast: Secondary | ICD-10-CM | POA: Diagnosis not present

## 2024-01-10 DIAGNOSIS — E2839 Other primary ovarian failure: Secondary | ICD-10-CM | POA: Insufficient documentation

## 2024-01-10 DIAGNOSIS — Z780 Asymptomatic menopausal state: Secondary | ICD-10-CM | POA: Diagnosis not present

## 2024-01-11 DIAGNOSIS — M5382 Other specified dorsopathies, cervical region: Secondary | ICD-10-CM | POA: Diagnosis not present

## 2024-01-11 DIAGNOSIS — R519 Headache, unspecified: Secondary | ICD-10-CM | POA: Diagnosis not present

## 2024-01-15 ENCOUNTER — Ambulatory Visit: Admitting: Clinical

## 2024-01-24 ENCOUNTER — Ambulatory Visit (INDEPENDENT_AMBULATORY_CARE_PROVIDER_SITE_OTHER): Admitting: Clinical

## 2024-01-24 ENCOUNTER — Ambulatory Visit: Admitting: Clinical

## 2024-01-24 DIAGNOSIS — F4321 Adjustment disorder with depressed mood: Secondary | ICD-10-CM
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
## 2024-01-24 NOTE — Progress Notes (Signed)
   Doree Barthel, LCSW

## 2024-01-25 ENCOUNTER — Other Ambulatory Visit: Payer: Self-pay | Admitting: Primary Care

## 2024-01-25 DIAGNOSIS — E1165 Type 2 diabetes mellitus with hyperglycemia: Secondary | ICD-10-CM

## 2024-01-31 DIAGNOSIS — N1831 Chronic kidney disease, stage 3a: Secondary | ICD-10-CM | POA: Diagnosis not present

## 2024-01-31 DIAGNOSIS — R300 Dysuria: Secondary | ICD-10-CM | POA: Diagnosis not present

## 2024-01-31 DIAGNOSIS — N1832 Chronic kidney disease, stage 3b: Secondary | ICD-10-CM | POA: Diagnosis not present

## 2024-01-31 DIAGNOSIS — R600 Localized edema: Secondary | ICD-10-CM | POA: Diagnosis not present

## 2024-01-31 DIAGNOSIS — I10 Essential (primary) hypertension: Secondary | ICD-10-CM | POA: Diagnosis not present

## 2024-01-31 DIAGNOSIS — D508 Other iron deficiency anemias: Secondary | ICD-10-CM | POA: Diagnosis not present

## 2024-01-31 DIAGNOSIS — G4733 Obstructive sleep apnea (adult) (pediatric): Secondary | ICD-10-CM | POA: Diagnosis not present

## 2024-01-31 DIAGNOSIS — E1122 Type 2 diabetes mellitus with diabetic chronic kidney disease: Secondary | ICD-10-CM | POA: Diagnosis not present

## 2024-02-07 DIAGNOSIS — I10 Essential (primary) hypertension: Secondary | ICD-10-CM | POA: Diagnosis not present

## 2024-02-07 DIAGNOSIS — E1122 Type 2 diabetes mellitus with diabetic chronic kidney disease: Secondary | ICD-10-CM | POA: Diagnosis not present

## 2024-02-07 DIAGNOSIS — N1831 Chronic kidney disease, stage 3a: Secondary | ICD-10-CM | POA: Diagnosis not present

## 2024-02-07 DIAGNOSIS — R600 Localized edema: Secondary | ICD-10-CM | POA: Diagnosis not present

## 2024-02-15 ENCOUNTER — Other Ambulatory Visit: Payer: Self-pay | Admitting: Primary Care

## 2024-02-15 DIAGNOSIS — R519 Headache, unspecified: Secondary | ICD-10-CM

## 2024-02-21 ENCOUNTER — Other Ambulatory Visit: Payer: Self-pay | Admitting: Primary Care

## 2024-02-21 DIAGNOSIS — E1165 Type 2 diabetes mellitus with hyperglycemia: Secondary | ICD-10-CM

## 2024-02-21 MED ORDER — METFORMIN HCL ER 500 MG PO TB24: 2000.0000 mg | ORAL_TABLET | Freq: Every day | ORAL | 0 refills | Status: DC

## 2024-02-21 NOTE — Telephone Encounter (Signed)
 Copied from CRM [id number]. Topic: Clinical - Medication Refill >> Feb 21, 2024  9:43 AM Jalayah J wrote: Medication: metFORMIN  (GLUCOPHAGE -XR) 500 MG 24 hr tablet  Has the patient contacted their pharmacy? Yes (Agent: If no, request that the patient contact the pharmacy for the refill. If patient does not wish to contact the pharmacy document the reason why and proceed with request.) (Agent: If yes, when and what did the pharmacy advise?)  This is the patient's preferred pharmacy:  [facility] - [place], [place] - [address] [place] [address] Phone: [phone] Fax: [phone]    Is this the correct pharmacy for this prescription? Yes If no, delete pharmacy and type the correct one.   Has the prescription been filled recently? Yes  Is the patient out of the medication? No  Has the patient been seen for an appointment in the last year OR does the patient have an upcoming appointment? Yes  Can we respond through MyChart? Yes  Agent: Please be advised that Rx refills may take up to 3 business days. We ask that you follow-up with your pharmacy.

## 2024-02-21 NOTE — Telephone Encounter (Signed)
 Last Fill: 08/25/23  Last OV: 12/20/23 Next OV: 06/19/24  Routing to provider for review/authorization.

## 2024-02-29 ENCOUNTER — Ambulatory Visit: Admitting: Clinical

## 2024-02-29 DIAGNOSIS — F4321 Adjustment disorder with depressed mood: Secondary | ICD-10-CM

## 2024-02-29 NOTE — Progress Notes (Signed)
 [facility] Counselor/Therapist Progress Note  Patient ID: Danielle Edwards, MRN: [id number]    Date: 02/29/24  Time Spent: 2:35  pm - 3:27 pm : 52 Minutes  Treatment Type: Individual Therapy.  Reported Symptoms: Patient reported sadness, crying at times  Mental Status Exam: Appearance:  Neat and Well Groomed     Behavior: Appropriate  Motor: Normal  Speech/Language:  Clear and Coherent and Normal Rate  Affect: Flat  Mood: normal  Thought process: tangential  Thought content:   Tangential  Sensory/Perceptual disturbances:   WNL  Orientation: oriented to person, place, time/date, and situation  Attention: Good  Concentration: Good  Memory: WNL  Fund of knowledge:  Good  Insight:   Good  Judgment:  Good  Impulse Control: Good   Risk Assessment: Danger to Self:  No  Self-injurious Behavior: No Danger to Others: No Duty to Warn:no Physical Aggression / Violence:No  Access to Firearms a concern: No  Gang Involvement:No   Subjective:  Patient reported no changes since last session. Patient stated, "I've just had a real rough 2024/2025".  Patient reported bladder cancer is currently in remission. Patient reported she continues to experience discomfort in patient's kidneys. Patient stated, "I'm going through where I'm not seeing some family members and its like it all hit me at one time, the situations". Patient stated, "I have spells, it's like going through a death" in reference to family dynamics. Patient reported sadness, crying when experiencing a "spell". Patient reported watching television shows provide patient relief from patient's thoughts. Patient reported patient's brother has a diagnosis of dementia and is currently in a long term care facility. Patient reported conflict with sister in law and reported sister in law notified patient she was not allowed to visit patient's brother. Patient reported she is worried about patient's other brother who was diagnosed  with dementia.  Patient stated, "its like a death" in reference to the loss of being able to see her grandson.   Interventions: Motivational Interviewing. Clinician conducted session in person at clinician's office at [facility]. Reviewed events since last session and assessed for changes. Discussed current stressors. Clinician reviewed diagnosis and treatment recommendations. Discussed clinician's scope of practice. Provided psycho education related to diagnosis, treatment, duration of sessions, and goals. Provided psycho education related to grief/loss. Clinician utilized motivational interviewing to explore potential goals for therapy. Clinician requested patient complete goal worksheet for homework.   Collaboration of Care: Other not required at this time  Diagnosis:  Adjustment disorder with depressed mood   Plan: Goals/target dates to be determined during follow up appointment on 03/26/24.                 Burlene Carpen, LCSW

## 2024-03-12 DIAGNOSIS — E119 Type 2 diabetes mellitus without complications: Secondary | ICD-10-CM | POA: Diagnosis not present

## 2024-03-14 DIAGNOSIS — M5382 Other specified dorsopathies, cervical region: Secondary | ICD-10-CM | POA: Diagnosis not present

## 2024-03-14 DIAGNOSIS — M797 Fibromyalgia: Secondary | ICD-10-CM | POA: Diagnosis not present

## 2024-03-14 DIAGNOSIS — R519 Headache, unspecified: Secondary | ICD-10-CM | POA: Diagnosis not present

## 2024-03-19 ENCOUNTER — Other Ambulatory Visit: Payer: Self-pay | Admitting: Primary Care

## 2024-03-19 DIAGNOSIS — F32A Depression, unspecified: Secondary | ICD-10-CM

## 2024-03-25 ENCOUNTER — Encounter: Admitting: Clinical

## 2024-03-25 NOTE — Progress Notes (Signed)
 This encounter was created in error - please disregard.

## 2024-03-26 ENCOUNTER — Ambulatory Visit: Admitting: Clinical

## 2024-04-08 ENCOUNTER — Ambulatory Visit (INDEPENDENT_AMBULATORY_CARE_PROVIDER_SITE_OTHER): Admitting: Clinical

## 2024-04-08 ENCOUNTER — Encounter: Payer: Self-pay | Admitting: Clinical

## 2024-04-08 DIAGNOSIS — F633 Trichotillomania: Secondary | ICD-10-CM

## 2024-04-08 DIAGNOSIS — F4321 Adjustment disorder with depressed mood: Secondary | ICD-10-CM | POA: Diagnosis not present

## 2024-04-08 NOTE — Progress Notes (Signed)
 [facility] Counselor/Therapist Progress Note  Patient ID: Danielle Edwards, MRN: [id number]    Date: 04/08/24  Time Spent: 9:32  am - 10:21 am : 49 Minutes  Treatment Type: Individual Therapy.  Reported Symptoms: interrupted sleep/difficulty returning to sleep  Mental Status Exam: Appearance:  Neat and Well Groomed     Behavior: Appropriate  Motor: Normal  Speech/Language:  Clear and Coherent and Normal Rate  Affect: Appropriate  Mood: normal  Thought process: normal  Thought content:   WNL  Sensory/Perceptual disturbances:   WNL  Orientation: oriented to person, place, time/date, and situation  Attention: Good  Concentration: Good  Memory: WNL  Fund of knowledge:  Good  Insight:   Good  Judgment:  Good  Impulse Control: Good   Risk Assessment: Danger to Self:  No Patient denied current suicidal ideation  Self-injurious Behavior: No Danger to Others: No  Patient denied current homicidal ideation Duty to Warn:no Physical Aggression / Violence:No  Access to Firearms a concern: No  Gang Involvement:No   Subjective:  Patient stated, I've been feeling good in response to patient's mood since last session. Patient stated, still kind of go back and forth on how to handle things in reference to family conflict. Patient reported learning how to relax and better manage my health, controlling eating and weight, changing a habit (overeating), talking out a pending decision, problem solving, decision making, improvement in sleep,  worry less in response to goals for therapy. Patient stated, I am a procrastinator. Patient reported ruminating thoughts regarding conflict with daughter in law and son are barriers to sleep. Patient reported patient has been pulling her hair since age 41 and reported a history of spots where my hair was missing. Patient reported talking to a friend daily and stated, that helps me a lot.   Interventions: Motivational Interviewing.  Clinician conducted session via caregility video from clinician's home office. Patient provided verbal consent to proceed with telehealth session and is aware of limitations of telephone or video visits. Patient participated in session from patient's vehicle at a rest stop in [place]. Clinician utilized motivational interviewing to explore potential goals for therapy. Clinician utilized a task centered approach in collaboration with patient to develop goals for therapy. Patient participated in development of goals and agreed to goals for therapy.   Collaboration of Care: Other not required at this time  Diagnosis:  Adjustment disorder with depressed mood  Trichotillomania   Plan: Patient is to utilize [employer] [facility], thought re-framing, relaxation techniques, mindfulness, behavioral activation, and coping strategies to decrease symptoms associated with their diagnosis. Frequency: bi-weekly  Modality: individual     Long-term goal:   Increase self care practices as evidenced by increase in making healthy dietary choices, maintaining a healthy weight, increase in physical activity, and increase in sleep  Target Date: 04/08/25  Progress: established 04/08/24   Short-term goal:  Identify, challenge, and replace negative thought patterns and negative self talk that contribute to feelings of anxiety, worry, and self doubt with positive thoughts, beliefs, and positive self talk per patient's report Target Date: 04/08/25  Progress: established 04/08/24   Increase use of coping skills, such as, relaxation techniques, mindfulness exercises from 1 times per day to 3-4 times a day Target Date: 04/08/25  Progress: established 04/08/24   Process loss associated with conflict with daughter in law, son, and grandson Target Date: 04/08/25  Progress: established 04/08/24   Develop effective communication strategies for patient to utilize when expressing patient's thoughts and feelings  to son and daughter in  law Target Date: 04/08/25  Progress: established 04/08/24                      Darice Seats, LCSW

## 2024-04-17 ENCOUNTER — Other Ambulatory Visit: Payer: Self-pay | Admitting: Primary Care

## 2024-04-17 DIAGNOSIS — E1165 Type 2 diabetes mellitus with hyperglycemia: Secondary | ICD-10-CM

## 2024-04-24 ENCOUNTER — Ambulatory Visit: Admitting: Clinical

## 2024-05-08 ENCOUNTER — Ambulatory Visit: Admitting: Clinical

## 2024-05-16 ENCOUNTER — Encounter

## 2024-05-16 DIAGNOSIS — M5382 Other specified dorsopathies, cervical region: Secondary | ICD-10-CM | POA: Diagnosis not present

## 2024-05-16 DIAGNOSIS — Z79899 Other long term (current) drug therapy: Secondary | ICD-10-CM | POA: Diagnosis not present

## 2024-05-16 DIAGNOSIS — R519 Headache, unspecified: Secondary | ICD-10-CM | POA: Diagnosis not present

## 2024-05-16 DIAGNOSIS — M797 Fibromyalgia: Secondary | ICD-10-CM | POA: Diagnosis not present

## 2024-06-04 ENCOUNTER — Other Ambulatory Visit: Payer: Self-pay | Admitting: Primary Care

## 2024-06-04 DIAGNOSIS — I10 Essential (primary) hypertension: Secondary | ICD-10-CM

## 2024-06-04 DIAGNOSIS — E1165 Type 2 diabetes mellitus with hyperglycemia: Secondary | ICD-10-CM

## 2024-06-04 MED ORDER — LOSARTAN POTASSIUM 100 MG PO TABS: 100.0000 mg | ORAL_TABLET | Freq: Every day | ORAL | 0 refills | Status: DC

## 2024-06-04 MED ORDER — AMLODIPINE BESYLATE 10 MG PO TABS: 10.0000 mg | ORAL_TABLET | Freq: Every day | ORAL | 0 refills | Status: DC

## 2024-06-04 MED ORDER — METFORMIN HCL ER 500 MG PO TB24: 2000.0000 mg | ORAL_TABLET | Freq: Every day | ORAL | 0 refills | Status: DC

## 2024-06-04 NOTE — Telephone Encounter (Unsigned)
 Copied from CRM [id number]. Topic: Clinical - Medication Refill >> Jun 04, 2024  9:56 AM Macario HERO wrote: Medication: metFORMIN  (GLUCOPHAGE -XR) 500 MG 24 hr tablet [[id number]] losartan  (COZAAR ) 100 MG tablet [[id number]] amLODipine  (NORVASC ) 10 MG tablet [[id number]]  Has the patient contacted their pharmacy? Yes (Agent: If no, request that the patient contact the pharmacy for the refill. If patient does not wish to contact the pharmacy document the reason why and proceed with request.) (Agent: If yes, when and what did the pharmacy advise?)  This is the patient's preferred pharmacy:  [facility] - [place], [place] - [address] [place] [address] Phone: [phone] Fax: [phone]    Is this the correct pharmacy for this prescription? Yes If no, delete pharmacy and type the correct one.   Has the prescription been filled recently? No  Is the patient out of the medication? Yes  Has the patient been seen for an appointment in the last year OR does the patient have an upcoming appointment? Yes  Can we respond through MyChart? Yes  Agent: Please be advised that Rx refills may take up to 3 business days. We ask that you follow-up with your pharmacy.

## 2024-06-19 ENCOUNTER — Ambulatory Visit: Admitting: Primary Care

## 2024-06-19 ENCOUNTER — Encounter: Payer: Self-pay | Admitting: Primary Care

## 2024-06-19 ENCOUNTER — Other Ambulatory Visit: Payer: Self-pay | Admitting: Primary Care

## 2024-06-19 VITALS — BP 124/72 | HR 72 | Temp 97.8°F | Ht 66.0 in | Wt 268.0 lb

## 2024-06-19 DIAGNOSIS — G479 Sleep disorder, unspecified: Secondary | ICD-10-CM | POA: Diagnosis not present

## 2024-06-19 DIAGNOSIS — I10 Essential (primary) hypertension: Secondary | ICD-10-CM

## 2024-06-19 DIAGNOSIS — K219 Gastro-esophageal reflux disease without esophagitis: Secondary | ICD-10-CM

## 2024-06-19 DIAGNOSIS — R0683 Snoring: Secondary | ICD-10-CM

## 2024-06-19 DIAGNOSIS — F419 Anxiety disorder, unspecified: Secondary | ICD-10-CM | POA: Diagnosis not present

## 2024-06-19 DIAGNOSIS — D649 Anemia, unspecified: Secondary | ICD-10-CM

## 2024-06-19 DIAGNOSIS — E559 Vitamin D deficiency, unspecified: Secondary | ICD-10-CM

## 2024-06-19 DIAGNOSIS — Z0001 Encounter for general adult medical examination with abnormal findings: Secondary | ICD-10-CM

## 2024-06-19 DIAGNOSIS — E1165 Type 2 diabetes mellitus with hyperglycemia: Secondary | ICD-10-CM

## 2024-06-19 DIAGNOSIS — G894 Chronic pain syndrome: Secondary | ICD-10-CM

## 2024-06-19 DIAGNOSIS — R519 Headache, unspecified: Secondary | ICD-10-CM | POA: Diagnosis not present

## 2024-06-19 DIAGNOSIS — E785 Hyperlipidemia, unspecified: Secondary | ICD-10-CM

## 2024-06-19 DIAGNOSIS — Z23 Encounter for immunization: Secondary | ICD-10-CM | POA: Diagnosis not present

## 2024-06-19 DIAGNOSIS — E039 Hypothyroidism, unspecified: Secondary | ICD-10-CM

## 2024-06-19 LAB — COMPREHENSIVE METABOLIC PANEL WITH GFR
ALT: 14 U/L (ref 0–35)
AST: 13 U/L (ref 0–37)
Albumin: 4.3 g/dL (ref 3.5–5.2)
Alkaline Phosphatase: 83 U/L (ref 39–117)
BUN: 35 mg/dL — ABNORMAL HIGH (ref 6–23)
CO2: 25 meq/L (ref 19–32)
Calcium: 10.7 mg/dL — ABNORMAL HIGH (ref 8.4–10.5)
Chloride: 105 meq/L (ref 96–112)
Creatinine, Ser: 1.29 mg/dL — ABNORMAL HIGH (ref 0.40–1.20)
GFR: 42.44 mL/min — ABNORMAL LOW (ref >60.00)
Glucose, Bld: 68 mg/dL — ABNORMAL LOW (ref 70–99)
Potassium: 4.6 meq/L (ref 3.5–5.1)
Sodium: 141 meq/L (ref 135–145)
Total Bilirubin: 0.3 mg/dL (ref 0.2–1.2)
Total Protein: 7.4 g/dL (ref 6.0–8.3)

## 2024-06-19 LAB — CBC
HCT: 37.7 % (ref 36.0–46.0)
Hemoglobin: 12 g/dL (ref 12.0–15.0)
MCHC: 31.8 g/dL (ref 30.0–36.0)
MCV: 90.5 fl (ref 78.0–100.0)
Platelets: 286 K/uL (ref 150.0–400.0)
RBC: 4.17 Mil/uL (ref 3.87–5.11)
RDW: 13.6 % (ref 11.5–15.5)
WBC: 6.5 K/uL (ref 4.0–10.5)

## 2024-06-19 LAB — LIPID PANEL
Cholesterol: 198 mg/dL (ref 0–200)
HDL: 52.7 mg/dL (ref >39.00)
LDL Cholesterol: 123 mg/dL — ABNORMAL HIGH (ref 0–99)
NonHDL: 145.55
Total CHOL/HDL Ratio: 4
Triglycerides: 113 mg/dL (ref 0.0–149.0)
VLDL: 22.6 mg/dL (ref 0.0–40.0)

## 2024-06-19 LAB — IBC + FERRITIN
Ferritin: 41.8 ng/mL (ref 10.0–291.0)
Iron: 82 ug/dL (ref 42–145)
Saturation Ratios: 20.9 % (ref 20.0–50.0)
TIBC: 392 ug/dL (ref 250.0–450.0)
Transferrin: 280 mg/dL (ref 212.0–360.0)

## 2024-06-19 LAB — HEMOGLOBIN A1C: Hgb A1c MFr Bld: 7.7 % — ABNORMAL HIGH (ref 4.6–6.5)

## 2024-06-19 LAB — VITAMIN D 25 HYDROXY (VIT D DEFICIENCY, FRACTURES): VITD: 55.34 ng/mL (ref 30.00–100.00)

## 2024-06-19 MED ORDER — MOUNJARO 2.5 MG/0.5ML ~~LOC~~ SOAJ: 2.5000 mg | SUBCUTANEOUS | 0 refills | Status: DC

## 2024-06-19 MED ORDER — PROPRANOLOL HCL ER 120 MG PO CP24: 120.0000 mg | ORAL_CAPSULE | Freq: Every day | ORAL | 0 refills | Status: DC

## 2024-06-19 NOTE — Assessment & Plan Note (Signed)
 Suspect sleep apnea.  She agrees to a sleep study.  Referral placed to pulmonology.

## 2024-06-19 NOTE — Assessment & Plan Note (Signed)
 Infrequent flares.  Continue Tums PRN.

## 2024-06-19 NOTE — Patient Instructions (Addendum)
 Start tirzepitide (Mounjaro ) for diabetes/weight loss. Start by injecting 2.5 mg into the skin once weekly for 4 weeks, then increase to 5 mg once weekly thereafter. Please notify me once you've used your last 2.5 mg pen so that I can prescribe the next dose.   Once you start Mounjaro  please stop taking the Soliqua  medication for diabetes.  Stop by the lab prior to leaving today. I will notify you of your results once received.   You will receive a phone call regarding the MRI of your brain and the CT scan of your heart.  Start taking the propranolol  ER 120 mg medication every night at bedtime for headache prevention.  Please update me in 2 to 3 weeks.  You will either be contacted via phone regarding your referral to pulmonology for sleep study, or you may receive a letter on your MyChart portal from our referral team with instructions for scheduling an appointment. Please let us  know if you have not been contacted by anyone within two weeks.  It was a pleasure to see you today!

## 2024-06-19 NOTE — Assessment & Plan Note (Signed)
 Following with pain management; Continue oxycodone IR 5-10 mg every 4-6 hours PRN

## 2024-06-19 NOTE — Assessment & Plan Note (Signed)
 Controlled.  Continue losartan  100 mg daily, amlodipine  10 mg daily. CMP pending.

## 2024-06-19 NOTE — Progress Notes (Signed)
 Subjective:    Patient ID: Danielle Edwards, female    DOB: Feb 24, 1956, 68 y.o.   MRN: [id number]  Danielle Edwards is a very pleasant 68 y.o. female who presents today for complete physical and follow up of chronic conditions.  She continues to experience daily headaches to left and mid parietal lobe. She is currently managed on propranolol  ER 120 mg HS for which she takes 2-3 times weekly. The propranolol  helped her frontal headaches but not her parietal lobes. A MRI brain was ordered at some point for which she never completed. She consistently wakes up between 2-3 am. She does have a history of loud snoring. She was referred for sleep study in 2020 but never followed through.  She would like to switch to GLP 1 agonist treatment as she's not see improvement in her weight on Soliqua . We prescribed Ozempic  last year but it was cost prohibitive. She is interested in Mounjaro . She stopped her on statin medication 1 year ago as she thought it was bad for her liver.  She has not taken Cymbalta  in about 6 months.  Immunizations:  -Influenza: Influenza vaccine provided today.  -Shingles: Completed Shingrix series -Pneumonia: Completed Prevnar 20 in 2022   Diet: Fair diet.  Exercise: No regular exercise.  Eye exam: Completes annually, she will schedule.  Dental exam: Completes semi-annually    Mammogram: Completed in April 2025 Bone Density Scan: Completed in  April 2025  Colonoscopy: Completed in 2025, due 2030  BP Readings from Last 3 Encounters:  06/19/24 124/72  01/03/24 (!) 189/75  12/20/23 126/80    Wt Readings from Last 3 Encounters:  06/19/24 268 lb (121.6 kg)  01/03/24 250 lb (113.4 kg)  12/20/23 263 lb (119.3 kg)       Review of Systems  Constitutional:  Negative for unexpected weight change.  HENT:  Negative for rhinorrhea.   Respiratory:  Negative for cough and shortness of breath.   Cardiovascular:  Negative for chest pain.  Gastrointestinal:  Negative for  constipation and diarrhea.  Genitourinary:  Negative for difficulty urinating.  Musculoskeletal:  Positive for arthralgias and back pain. Negative for myalgias.  Skin:  Negative for rash.  Allergic/Immunologic: Negative for environmental allergies.  Neurological:  Positive for headaches. Negative for dizziness.  Psychiatric/Behavioral:  Positive for sleep disturbance. The patient is nervous/anxious.          Past Medical History:  Diagnosis Date   Allergy    seasonal allergies   Anemia    hx of IDA   Anxiety    on meds   Arthritis    PRN meds-osteoarthritis   Asthma    Cataract    sx to remove   Chronic kidney disease    CKD-stage 3   Chronic pain    COVID-19 06/02/2020   Depression    on meds   Diabetes type 2, controlled (HCC)    on meds   Fatigue 09/17/2019   Fibromyalgia    GERD (gastroesophageal reflux disease)    on meds   Headache    History of colonic polyps 1999   Benign   Hx of adenomatous polyp of colon 09/27/2000   2001 - diminutive adenoma [place] 2007 no polyps - [place]   Hyperlipidemia    on meds   Hypertension    on meds   Hypothyroidism    not on meds at this time   IBS (irritable bowel syndrome)    Diarrhea type   IDA (iron  deficiency anemia) 02/09/2022  Neuromuscular disorder (HCC)    Sleep apnea    never tested but cant lay flat   Vitamin D  deficiency     Social History   Socioeconomic History   Marital status: Married    Spouse name: Schlarb,Jesse (Spouse)   Number of children: Not on file   Years of education: Not on file   Highest education level: 12th grade  Occupational History   Not on file  Tobacco Use   Smoking status: Never   Smokeless tobacco: Never  Vaping Use   Vaping status: Never Used  Substance and Sexual Activity   Alcohol use: Yes    Alcohol/week: 0.0 standard drinks of alcohol    Comment: rarely   Drug use: No   Sexual activity: Not on file  Other Topics Concern   Not on file  Social History  Narrative   Married.   3 children.   Retired, worked at [employer] in [place], [place].   Enjoys being with her grandchildren, being with her friends.   Social Drivers of Corporate investment banker Strain: Low Risk  (03/08/2023)   Overall Financial Resource Strain (CARDIA)    Difficulty of Paying Living Expenses: Not hard at all  Food Insecurity: No Food Insecurity (03/08/2023)   Hunger Vital Sign    Worried About Running Out of Food in the Last Year: Never true    Ran Out of Food in the Last Year: Never true  Transportation Needs: No Transportation Needs (03/08/2023)   PRAPARE - Administrator, Civil Service (Medical): No    Lack of Transportation (Non-Medical): No  Physical Activity: Inactive (03/08/2023)   Exercise Vital Sign    Days of Exercise per Week: 0 days    Minutes of Exercise per Session: 0 min  Stress: No Stress Concern Present (03/08/2023)   [employer] of Occupational Health - Occupational Stress Questionnaire    Feeling of Stress : Not at all  Recent Concern: Stress - Stress Concern Present (01/31/2023)   [employer] of Occupational Health - Occupational Stress Questionnaire    Feeling of Stress : To some extent  Social Connections: Socially Integrated (03/08/2023)   Social Connection and Isolation Panel    Frequency of Communication with Friends and Family: More than three times a week    Frequency of Social Gatherings with Friends and Family: More than three times a week    Attends Religious Services: More than 4 times per year    Active Member of [employer] or Organizations: Yes    Attends Engineer, structural: More than 4 times per year    Marital Status: Married  Catering manager Violence: Not At Risk (03/08/2023)   Humiliation, Afraid, Rape, and Kick questionnaire    Fear of Current or Ex-Partner: No    Emotionally Abused: No    Physically Abused: No    Sexually Abused: No    Past Surgical History:  Procedure Laterality Date   CHOLECYSTECTOMY   1995   COLONOSCOPY  multiple   COLONOSCOPY  2016   TA's   EYE SURGERY Bilateral    cataracts   NASAL SINUS SURGERY  2001   POLYPECTOMY     TA's   TONSILLECTOMY     TONSILLECTOMY AND ADENOIDECTOMY     TRANSURETHRAL RESECTION OF BLADDER TUMOR N/A 08/08/2023   Procedure: TRANSURETHRAL RESECTION OF BLADDER TUMOR (TURBT) w/ INSTILLATION OF GEMCITABINE ;  Surgeon: Twylla Glendia BROCKS, MD;  Location: [facility];  Service: Urology;  Laterality: N/A;  VAGINAL HYSTERECTOMY     Complete   WISDOM TOOTH EXTRACTION      Family History  Problem Relation Age of Onset   Colon cancer Mother 63   Lung cancer Mother 38   Skin cancer Mother    Colon polyps Mother 73   Heart disease Father    Diabetes Father    Skin cancer Father    Colon polyps Brother 40   Stomach cancer Paternal Grandmother 41   Breast cancer Neg Hx    Esophageal cancer Neg Hx    Rectal cancer Neg Hx     No Known Allergies  Current Outpatient Medications on File Prior to Visit  Medication Sig Dispense Refill   Accu-Chek FastClix Lancets MISC USE AS INSTRUCTED TO CHECK BLOOD SUGAR UP TO 3 TIMES DAILY 306 each 1   amLODipine  (NORVASC ) 10 MG tablet Take 1 tablet (10 mg total) by mouth daily. for blood pressure. 90 tablet 0   Blood Glucose Monitoring Suppl DEVI 1 each by Does not apply route in the morning, at noon, and at bedtime. May substitute to any manufacturer covered by patient's insurance. 1 each 0   Cholecalciferol (VITAMIN D3) 125 MCG (5000 UT) CAPS Take 5,000 Units by mouth daily.     colchicine  0.6 MG tablet Take 2 tablets by mouth at gout onset, then take 1 tablet 2 hours later.  Then take 1 tablet twice daily until gout flare resolves. 60 tablet 0   DROPLET PEN NEEDLES 32G X 6 MM MISC USE ONCE DAILY TO INJECT INSULIN . 100 each 3   Glucose Blood (BLOOD GLUCOSE TEST STRIPS) STRP 1 each by In Vitro route in the morning, at noon, and at bedtime. May substitute to any manufacturer covered by patient's insurance. 300 strip 0    ibuprofen (ADVIL) 800 MG tablet Take 800 mg by mouth daily as needed (pain.).     iron  sucrose in sodium chloride  0.9 % 100 mL Inject into the vein as needed.     IRON , FERROUS SULFATE, PO Take 1 tablet by mouth in the morning.     Lancets Misc. MISC 1 each by Does not apply route in the morning, at noon, and at bedtime. May substitute to any manufacturer covered by patient's insurance. 300 each 0   loperamide (IMODIUM A-D) 2 MG tablet Take 2 mg by mouth 4 (four) times daily as needed for diarrhea or loose stools (ibs symptoms.).     losartan  (COZAAR ) 100 MG tablet Take 1 tablet (100 mg total) by mouth daily. for blood pressure. 90 tablet 0   MAGNESIUM PO Take 1 tablet by mouth daily.     metFORMIN  (GLUCOPHAGE -XR) 500 MG 24 hr tablet Take 4 tablets (2,000 mg total) by mouth daily with breakfast. for diabetes. 360 tablet 0   Meth-Hyo-M Bl-Na Phos-Ph Sal (URIBEL ) 118 MG CAPS Take 1 capsule (118 mg total) by mouth 4 (four) times daily as needed. 30 capsule 3   Multiple Vitamins-Minerals (CENTRUM SILVER ULTRA WOMENS PO) Take 1 tablet by mouth daily at 6 (six) AM.     oxybutynin  (DITROPAN ) 5 MG tablet 1 tab bid prn frequency,urgency, bladder spasm 10 tablet 0   oxyCODONE (OXY IR/ROXICODONE) 5 MG immediate release tablet 1-2 tablets every 4-6 hr as needed; #225 per month     phenazopyridine  (PYRIDIUM ) 200 MG tablet Take 1 tablet (200 mg total) by mouth 3 (three) times daily as needed (burning with urination). 10 tablet 0   SOLIQUA  100-33 UNT-MCG/ML SOPN INJECT 60 UNITS  INTO THE SKIN DAILY FOR DIABETES 45 mL 0   No current facility-administered medications on file prior to visit.    BP 124/72   Pulse 72   Temp 97.8 F (36.6 C) (Temporal)   Ht 5' 6 (1.676 m)   Wt 268 lb (121.6 kg)   SpO2 100%   BMI 43.26 kg/m  Objective:   Physical Exam HENT:     Right Ear: Tympanic membrane and ear canal normal.     Left Ear: Tympanic membrane and ear canal normal.  Eyes:     Pupils: Pupils are equal,  round, and reactive to light.  Cardiovascular:     Rate and Rhythm: Normal rate and regular rhythm.  Pulmonary:     Effort: Pulmonary effort is normal.     Breath sounds: Normal breath sounds.  Abdominal:     General: Bowel sounds are normal.     Palpations: Abdomen is soft.     Tenderness: There is no abdominal tenderness.  Musculoskeletal:        General: Normal range of motion.     Cervical back: Neck supple.  Skin:    General: Skin is warm and dry.  Neurological:     Mental Status: She is alert and oriented to person, place, and time.     Cranial Nerves: No cranial nerve deficit.     Deep Tendon Reflexes:     Reflex Scores:      Patellar reflexes are 2+ on the right side and 2+ on the left side. Psychiatric:        Mood and Affect: Mood normal.     Physical Exam        Assessment & Plan:  Encounter for annual general medical examination with abnormal findings in adult Assessment & Plan: Immunizations UTD. Influenza vaccine provided today.  Mammogram and bone density scan UTD Colonoscopy UTD, due 2030  Discussed the importance of a healthy diet and regular exercise in order for weight loss, and to reduce the risk of further co-morbidity.  Exam stable. Labs pending.  Follow up in 1 year for repeat physical.    Gastroesophageal reflux disease, unspecified whether esophagitis present Assessment & Plan: Infrequent flares.  Continue Tums PRN.   Essential hypertension Assessment & Plan: Controlled.  Continue losartan  100 mg daily, amlodipine  10 mg daily. CMP pending.  Orders: -     Comprehensive metabolic panel with GFR -     CT CARDIAC SCORING (SELF PAY ONLY); Future  Type 2 diabetes mellitus with hyperglycemia, without long-term current use of insulin  (HCC) Assessment & Plan: Repeat A1C pending.  Continue metformin  2000 mg daily. Stop Soliqua  60 units daily.  Start tirzepitide (Mounjaro ) for diabetes/weight loss. Start by injecting 2.5 mg into  the skin once weekly for 4 weeks, then increase to 5 mg once weekly thereafter.   Follow up in 3 months.    Orders: -     Hemoglobin A1c -     Mounjaro ; Inject 2.5 mg into the skin once a week. for diabetes.  Dispense: 2 mL; Refill: 0  Anxiety and depression Assessment & Plan: Active symptoms.  For some reason she stopped taking her Cymbalta  for 6 months.   Remain off for now. We will have her resume propranolol  ER 120 mg consistently. Consider resuming Cymbalta  30 mg daily for depression. She will update.     Chronic pain syndrome Assessment & Plan: Following with pain management; Continue oxycodone IR 5-10 mg every 4-6 hours PRN   Frequent  headaches Assessment & Plan: Uncontrolled.  Also not taking propranolol  as prescribed. Discussed to resume every night.  Resume propranolol  ER 120 mg HS  MRI brain ordered.   She will update.   Orders: -     MR BRAIN WO CONTRAST; Future -     Propranolol  HCl ER; Take 1 capsule (120 mg total) by mouth at bedtime. For headache prevention  Dispense: 90 capsule; Refill: 0  Hyperlipidemia LDL goal <100 Assessment & Plan: Repeat lipid panel pending.   Remain off atorvastatin  40 mg as she's not taken in 1 year. Await results first.   Orders: -     Lipid panel -     Comprehensive metabolic panel with GFR -     CT CARDIAC SCORING (SELF PAY ONLY); Future  Hypothyroidism, unspecified type Assessment & Plan: Repeat TSH pending.  Not currently on treatment.   Vitamin D  deficiency Assessment & Plan: Repeat vitamin D  level pending.  Orders: -     VITAMIN D  25 Hydroxy (Vit-D Deficiency, Fractures)  Sleep disturbance Assessment & Plan: Suspect sleep apnea.  She agrees to a sleep study.  Referral placed to pulmonology.  Orders: -     Pulmonary Visit  Anemia, unspecified type -     IBC + Ferritin -     CBC  Loud snoring -     Pulmonary Visit  Encounter for immunization -     Flu vaccine HIGH DOSE PF(Fluzone  Trivalent)    Assessment and Plan Assessment & Plan         Comer MARLA Gaskins, NP    History of Present Illness

## 2024-06-19 NOTE — Assessment & Plan Note (Signed)
 Repeat A1C pending.  Continue metformin  2000 mg daily. Stop Soliqua  60 units daily.  Start tirzepitide (Mounjaro ) for diabetes/weight loss. Start by injecting 2.5 mg into the skin once weekly for 4 weeks, then increase to 5 mg once weekly thereafter.   Follow up in 3 months.

## 2024-06-19 NOTE — Assessment & Plan Note (Signed)
 Active symptoms.  For some reason she stopped taking her Cymbalta  for 6 months.   Remain off for now. We will have her resume propranolol  ER 120 mg consistently. Consider resuming Cymbalta  30 mg daily for depression. She will update.

## 2024-06-19 NOTE — Assessment & Plan Note (Signed)
Repeat vitamin D level pending. 

## 2024-06-19 NOTE — Assessment & Plan Note (Signed)
Repeat TSH pending. ? ?Not currently on treatment. ?

## 2024-06-19 NOTE — Assessment & Plan Note (Signed)
 Immunizations UTD. Influenza vaccine provided today.  Mammogram and bone density scan UTD Colonoscopy UTD, due 2030  Discussed the importance of a healthy diet and regular exercise in order for weight loss, and to reduce the risk of further co-morbidity.  Exam stable. Labs pending.  Follow up in 1 year for repeat physical.

## 2024-06-19 NOTE — Assessment & Plan Note (Signed)
 Repeat lipid panel pending.   Remain off atorvastatin  40 mg as she's not taken in 1 year. Await results first.

## 2024-06-19 NOTE — Assessment & Plan Note (Addendum)
 Uncontrolled.  Also not taking propranolol  as prescribed. Discussed to resume every night.  Resume propranolol  ER 120 mg HS  MRI brain ordered.   She will update.

## 2024-06-20 ENCOUNTER — Ambulatory Visit: Payer: Self-pay | Admitting: Primary Care

## 2024-06-20 DIAGNOSIS — R931 Abnormal findings on diagnostic imaging of heart and coronary circulation: Secondary | ICD-10-CM

## 2024-06-20 DIAGNOSIS — E785 Hyperlipidemia, unspecified: Secondary | ICD-10-CM

## 2024-06-20 NOTE — Telephone Encounter (Signed)
 Please call patient:  Was she able to pick up the Mounjaro  prescription for diabetes at the pharmacy?  If so then I will have her discontinue the Soliqua .   She will need diabetes follow-up in 3 months.  See result note.

## 2024-06-20 NOTE — Telephone Encounter (Signed)
 Called and spoke with patient, she was able to pickup mounjaro  from the pharmacy. She will discontinue the Soliqua . Scheduled 3 mo f/u

## 2024-06-21 MED ORDER — ATORVASTATIN CALCIUM 20 MG PO TABS: 20.0000 mg | ORAL_TABLET | Freq: Every day | ORAL | 3 refills | Status: AC

## 2024-06-21 NOTE — Telephone Encounter (Signed)
 Copied from CRM [id number]. Topic: Clinical - Lab/Test Results >> Jun 21, 2024  1:24 PM Paige D wrote: Reason for CRM: pt calling in regards to missed call from [facility]. CAL stated she was out to lunch.  Please reach out to pt in regards to missed call/VM

## 2024-06-21 NOTE — Telephone Encounter (Signed)
 Noted

## 2024-06-26 ENCOUNTER — Ambulatory Visit
Admission: RE | Admit: 2024-06-26 | Discharge: 2024-06-26 | Disposition: A | Discharge status: Home / Self Care | Payer: Self-pay | Source: Ambulatory Visit | Attending: Primary Care | Admitting: Primary Care

## 2024-06-26 DIAGNOSIS — E785 Hyperlipidemia, unspecified: Secondary | ICD-10-CM | POA: Insufficient documentation

## 2024-06-26 DIAGNOSIS — I10 Essential (primary) hypertension: Secondary | ICD-10-CM | POA: Insufficient documentation

## 2024-07-03 ENCOUNTER — Ambulatory Visit

## 2024-07-08 DIAGNOSIS — E119 Type 2 diabetes mellitus without complications: Secondary | ICD-10-CM | POA: Diagnosis not present

## 2024-07-09 DIAGNOSIS — M797 Fibromyalgia: Secondary | ICD-10-CM | POA: Diagnosis not present

## 2024-07-09 DIAGNOSIS — R519 Headache, unspecified: Secondary | ICD-10-CM | POA: Diagnosis not present

## 2024-07-09 DIAGNOSIS — M5382 Other specified dorsopathies, cervical region: Secondary | ICD-10-CM | POA: Diagnosis not present

## 2024-07-26 ENCOUNTER — Telehealth: Payer: Self-pay

## 2024-07-26 NOTE — Telephone Encounter (Signed)
 The patient is scheduled to see Lonni Hanson, MD on 07/31/2024 as a new patient. Called and left a voice message to inquire about any previous cardiac history. Requested the patient to call back at their earliest convenience.

## 2024-07-29 NOTE — Telephone Encounter (Signed)
 Pt returned call - pt called in stating she saw Dr. Ricka at [facility] over 10 years ago. He is retired now.

## 2024-07-31 ENCOUNTER — Ambulatory Visit: Attending: Internal Medicine | Admitting: Internal Medicine

## 2024-07-31 ENCOUNTER — Encounter: Payer: Self-pay | Admitting: Internal Medicine

## 2024-07-31 ENCOUNTER — Ambulatory Visit
Admission: RE | Admit: 2024-07-31 | Discharge: 2024-07-31 | Disposition: A | Discharge status: Home / Self Care | Attending: Internal Medicine | Admitting: Internal Medicine

## 2024-07-31 VITALS — BP 128/72 | HR 63 | Ht 67.0 in | Wt 267.8 lb

## 2024-07-31 DIAGNOSIS — I10 Essential (primary) hypertension: Secondary | ICD-10-CM

## 2024-07-31 DIAGNOSIS — R079 Chest pain, unspecified: Secondary | ICD-10-CM

## 2024-07-31 DIAGNOSIS — E1169 Type 2 diabetes mellitus with other specified complication: Secondary | ICD-10-CM

## 2024-07-31 DIAGNOSIS — I25110 Atherosclerotic heart disease of native coronary artery with unstable angina pectoris: Secondary | ICD-10-CM | POA: Diagnosis not present

## 2024-07-31 DIAGNOSIS — E785 Hyperlipidemia, unspecified: Secondary | ICD-10-CM

## 2024-07-31 LAB — CBC
HCT: 35.7 % — ABNORMAL LOW (ref 36.0–46.0)
Hemoglobin: 11.5 g/dL — ABNORMAL LOW (ref 12.0–15.0)
MCH: 29.6 pg (ref 26.0–34.0)
MCHC: 32.2 g/dL (ref 30.0–36.0)
MCV: 92 fL (ref 80.0–100.0)
Platelets: 275 K/uL (ref 150–400)
RBC: 3.88 MIL/uL (ref 3.87–5.11)
RDW: 12.9 % (ref 11.5–15.5)
WBC: 8.3 K/uL (ref 4.0–10.5)
nRBC: 0 % (ref 0.0–0.2)

## 2024-07-31 LAB — BASIC METABOLIC PANEL WITH GFR
Anion gap: 11 (ref 5–15)
BUN: 31 mg/dL — ABNORMAL HIGH (ref 8–23)
CO2: 25 mmol/L (ref 22–32)
Calcium: 9.8 mg/dL (ref 8.9–10.3)
Chloride: 105 mmol/L (ref 98–111)
Creatinine, Ser: 1.36 mg/dL — ABNORMAL HIGH (ref 0.44–1.00)
GFR, Estimated: 42 mL/min — ABNORMAL LOW (ref >60)
Glucose, Bld: 135 mg/dL — ABNORMAL HIGH (ref 70–99)
Potassium: 4.7 mmol/L (ref 3.5–5.1)
Sodium: 141 mmol/L (ref 135–145)

## 2024-07-31 LAB — TROPONIN I (HIGH SENSITIVITY): Troponin I (High Sensitivity): 5 ng/L (ref <18)

## 2024-07-31 NOTE — H&P (View-Only) (Signed)
 Cardiology Office Note:  .   Date:  07/31/2024  ID:  Danielle Edwards, DOB 10/12/1955, MRN [id number] PCP: Danielle Comer POUR, NP  [facility] Providers Cardiologist:  Danielle Edwards,subspecialty Edwards or APP then REFRESH:1}    History of Present Illness: .    Discussed the use of AI scribe software for clinical note transcription with the patient, who gave verbal consent to proceed.  Danielle Edwards is a 68 y.o. female with history of hypertension, hyperlipidemia, type 2 diabetes mellitus, fibromyalgia, IBS, anemia, and suspected obstructive sleep apnea, who has been referred for evaluation of coronary artery calcification and hyperlipidemia. Danielle Edwards has experienced chest discomfort for a couple of years, described as a constant pain sometimes accompanied by discomfort in the middle of her back. She is unsure if exertion worsens the pain due to limited activity. She experiences shortness of breath when walking quickly or hurrying and occasionally notices her heart skipping beats. No recent leg swelling is noted. She was previously prescribed propranolol , which she takes nightly and feels like it helps with her chest discomfort.  A CT scan ordered by her primary care provider revealed a calcium  score of 950, placing her in the 97th percentile for her age group. She has a history of diabetes and was previously under the care of a cardiologist in [place], [place] , about fifteen years ago, where she underwent a treadmill test and heart catheterization, which were reportedly normal. Her medication regimen includes atorvastatin  20 mg, amlodipine  10 mg, losartan , propranolol  for headaches, and a baby aspirin daily. She previously did not take her cholesterol medication due to concerns about side effects but has recently resumed it.  She has a history of fibromyalgia and reports widespread pain, including headaches and chest discomfort. She has a history of bladder cancer diagnosed  in November of the previous year, treated surgically and currently in remission. She also has a history of precancerous polyps in her colon, for which she underwent a colonoscopy last year.     ROS: See HPI  Studies Reviewed: SABRA   EKG Interpretation Date/Time:  Wednesday July 31 2024 11:26:38 EDT Ventricular Rate:  63 PR Interval:  174 QRS Duration:  88 QT Interval:  430 QTC Calculation: 440 R Axis:   -14  Text Interpretation: Normal sinus rhythm Possible Anterior infarct (cited on or before 03-Aug-2023) Abnormal ECG When compared with ECG of 03-Aug-2023 13:50, No significant change was found Confirmed by Danielle Edwards, Danielle [phone]) on 07/31/2024 11:53:24 AM    Coronary calcium  score (06/26/2024): Coronary calcium  score 953, predominantly involving the LAD (97th percentile for age and sex matched controls).  Aortic atherosclerosis also noted.  Otherwise, no significant extracardiac findings.  Risk Assessment/Calculations:             Physical Exam:   VS:  BP 128/72   Pulse 63   Ht 5' 7 (1.702 m)   Wt 267 lb 12.8 oz (121.5 kg)   SpO2 96%   BMI 41.94 kg/m    Wt Readings from Last 3 Encounters:  07/31/24 267 lb 12.8 oz (121.5 kg)  06/19/24 268 lb (121.6 kg)  01/03/24 250 lb (113.4 kg)    General:  NAD. Neck: No gross JVD or HJR, though evaluation is limited by body habitus. Lungs: Clear to auscultation bilaterally without wheezes or crackles. Heart: Regular rate and rhythm without murmurs, rubs, or gallops. Abdomen: Obese, soft, nontender/nondistended. Extremities: No lower extremity edema.  ASSESSMENT AND PLAN: .    Coronary artery  disease with unstable angina: Danielle Edwards reports chest and upper back pain present for years though it seems like it has become a little more pronounced than in the past.  It is not clearly exertional but seems to improved with propranolol , raising potential for ischemic heart disease.  Recent coronary calcium  score was also very high, placing Ms.  Edwards in the 97th percentile, I have personally reviewed the CT scan which shows heavy calcification in the LMCA, extending into the LAD and LCx.  We discussed referral to the emergency department today given that she has mild chest pain even now, though given its chronicity and reassuring EKG, we have agreed to defer this.  I will check a high-sensitivity troponin I today as well as a CBC and BMP.  If the troponin is elevated, she will be referred to the ED.  Otherwise, we will proceed with left heart catheterization and possible PCI next week.  I advised her to go to the ER if she were to have worsening chest pain in the meantime.  We will continue her current regimen of aspirin and atorvastatin  as well as amlodipine  and propranolol .  Given her moderately impaired renal function (GFR 42), Danielle Edwards will need to come in 4 hours early for prehydration in anticipation of her catheterization.  Hyperlipidemia associated with type 2 diabetes mellitus: Continue atorvastatin  20 mg daily for now.  Will need repeat lipid panel in a month or 2 with escalation of atorvastatin  if LDL remains above 70.  Ongoing management of DM per Danielle Edwards.  Hypertension: Blood pressure well-controlled today.  Continue current regimen of amlodipine , losartan , and propranolol .  Morbid obesity: BMI greater than 40 with multiple comorbidities.  Weight loss encouraged through diet and exercise.    Informed Consent   Shared Decision Making/Informed Consent The risks [stroke (1 in 1000), death (1 in 1000), kidney failure [usually temporary] (1 in 500), bleeding (1 in 200), allergic reaction [possibly serious] (1 in 200)], benefits (diagnostic support and management of coronary artery disease) and alternatives of a cardiac catheterization were discussed in detail with Danielle Edwards and she is willing to proceed.     Dispo: Return to clinic in 3 weeks.  Signed, Danielle Hanson, Edwards

## 2024-07-31 NOTE — Patient Instructions (Signed)
 Medication Instructions:  Your physician recommends that you continue on your current medications as directed. Please refer to the Current Medication list given to you today.    *If you need a refill on your cardiac medications before your next appointment, please call your pharmacy*  Lab Work: Your provider would like for you to have following labs drawn today BMP, CBC, Troponin.     Testing/Procedures:  [facility] [place] [facility] [place] [place] [address] [place] [address] Dept: [phone] Loc: [phone]  Danielle Edwards  07/31/2024  You are scheduled for a Cardiac Catheterization on Tuesday, November 4 with Dr. Lonni End.  1. Please arrive at the Heart & Vascular Center Entrance of [facility], [address], [place] [address] at 7:30 AM (This is 4 hour(s) prior to your procedure time).  Proceed to the Check-In Desk directly inside the entrance.  Procedure Parking: Use the entrance off of the [address] side of the hospital. Turn right upon entering and follow the driveway to parking that is directly in front of the Heart & Vascular Center. There is no valet parking available at this entrance, however there is an awning directly in front of the Heart & Vascular Center for drop off/ pick up for patients.  Special note: Every effort is made to have your procedure done on time. Please understand that emergencies sometimes delay scheduled procedures.  2. Diet: Nothing to eat after midnight.   3. Hydration: You need to be well hydrated before your procedure. On November 4, you may drink approved liquids (see below) until 2 hours before the procedure, with 16 oz of water as your last intake.   List of approved liquids water, clear juice, clear tea, black coffee, fruit juices, non-citric and without pulp, carbonated beverages, Gatorade, Kool -Aid, plain Jello-O and plain ice  popsicles.  4. Labs: You will need to have blood drawn today   5. Medication instructions in preparation for your procedure:   Contrast Allergy: No  Hold Metformin  that day of procedure and 48 hours after  On the morning of your procedure, take your Aspirin 81 mg and any morning medicines NOT listed above.  You may use sips of water.  6. Plan to go home the same day, you will only stay overnight if medically necessary. 7. Bring a current list of your medications and current insurance cards. 8. You MUST have a responsible person to drive you home. 9. Someone MUST be with you the first 24 hours after you arrive home or your discharge will be delayed. 10. Please wear clothes that are easy to get on and off and wear slip-on shoes.  Thank you for allowing us  to care for you!   -- [facility]   Follow-Up: At [facility], you and your health needs are our priority.  As part of our continuing mission to provide you with exceptional heart care, our providers are all part of one team.  This team includes your primary Cardiologist (physician) and Advanced Practice Providers or APPs (Physician Assistants and Nurse Practitioners) who all work together to provide you with the care you need, when you need it.  Your next appointment:   3 week(s)  Provider:   You may see Lonni Hanson, MD or one of the following Advanced Practice Providers on your designated Care Team:   Lonni Meager, NP Lesley Maffucci, PA-C Bernardino Bring, PA-C Cadence Franchester,  PA-C Tylene Lunch, NP Barnie Hila, NP

## 2024-07-31 NOTE — Progress Notes (Unsigned)
  Cardiology Office Note:  .   Date:  07/31/2024  ID:  Danielle Edwards, DOB [birth year], MRN [id number] PCP: Gretta Comer POUR, NP  [facility] Providers Cardiologist:  New Click to update primary MD,subspecialty MD or APP then REFRESH:1}    History of Present Illness: .   Danielle Edwards is a 68 y.o. female with history of hypertension, hyperlipidemia, type 2 diabetes mellitus, fibromyalgia, IBS, anemia, and suspected obstructive sleep apnea, who has been referred for evaluation of coronary artery calcification and hyperlipidemia.  Propranolol  helps with chest pain.    ROS: See HPI  Studies Reviewed: SABRA   EKG Interpretation Date/Time:  Wednesday July 31 2024 11:26:38 EDT Ventricular Rate:  63 PR Interval:  174 QRS Duration:  88 QT Interval:  430 QTC Calculation: 440 R Axis:   -14  Text Interpretation: Normal sinus rhythm Possible Anterior infarct (cited on or before 03-Aug-2023) Abnormal ECG When compared with ECG of 03-Aug-2023 13:50, No significant change was found Confirmed by Emiliana Blaize, Lonni [phone]) on 07/31/2024 11:53:24 AM    Coronary calcium  score (06/26/2024): Coronary calcium  score 953, predominantly involving the LAD (97th percentile for age and sex matched controls).  Aortic atherosclerosis also noted.  Otherwise, no significant extracardiac findings.  Risk Assessment/Calculations:   {Does this patient have ATRIAL FIBRILLATION?[phone]}         Physical Exam:   VS:  BP 128/72   Pulse 63   Ht 5' 7 (1.702 m)   Wt 267 lb 12.8 oz (121.5 kg)   SpO2 96%   BMI 41.94 kg/m    Wt Readings from Last 3 Encounters:  07/31/24 267 lb 12.8 oz (121.5 kg)  06/19/24 268 lb (121.6 kg)  01/03/24 250 lb (113.4 kg)    General:  NAD. Neck: No JVD or HJR. Lungs: Clear to auscultation bilaterally without wheezes or crackles. Heart: Regular rate and rhythm without murmurs, rubs, or gallops. Abdomen: Soft, nontender, nondistended. Extremities: No lower extremity  edema.  ASSESSMENT AND PLAN: .    ***    {Are you ordering a CV Procedure (e.g. stress test, cath, DCCV, TEE, etc)?   Press F2        :[id number]}  Dispo: ***  Signed, Lonni Hanson, MD

## 2024-08-01 ENCOUNTER — Telehealth: Payer: Self-pay | Admitting: Internal Medicine

## 2024-08-01 NOTE — Telephone Encounter (Signed)
 Pt called and per DPR message left that answers question posed by triage incoming call note  PT reminded of upcoming appointment and encouraged to call if any concerns or questions remain

## 2024-08-01 NOTE — Telephone Encounter (Signed)
 Patient says Dr. Mady previously advised that she may need a stent or bypass after 11/04 heart cath. She would like to know if this would be completed at a later date or during procedure. Please advise.

## 2024-08-02 ENCOUNTER — Telehealth: Payer: Self-pay | Admitting: Internal Medicine

## 2024-08-02 ENCOUNTER — Ambulatory Visit: Payer: Self-pay | Admitting: Internal Medicine

## 2024-08-02 ENCOUNTER — Encounter: Payer: Self-pay | Admitting: Internal Medicine

## 2024-08-02 DIAGNOSIS — I25110 Atherosclerotic heart disease of native coronary artery with unstable angina pectoris: Secondary | ICD-10-CM | POA: Insufficient documentation

## 2024-08-02 NOTE — Telephone Encounter (Signed)
 Pt provided with the below cath instructions.   You are scheduled for a Cardiac Catheterization on Tuesday, November 4 with Dr. Lonni End.   1. Please arrive at the Heart & Vascular Center Entrance of [facility], [address], [place] [address] at 7:30 AM (This is 4 hour(s) prior to your procedure time).  Proceed to the Check-In Desk directly inside the entrance.   Procedure Parking: Use the entrance off of the [address] side of the hospital. Turn right upon entering and follow the driveway to parking that is directly in front of the Heart & Vascular Center. There is no valet parking available at this entrance, however there is an awning directly in front of the Heart & Vascular Center for drop off/ pick up for patients.   Special note: Every effort is made to have your procedure done on time. Please understand that emergencies sometimes delay scheduled procedures.   2. Diet: Nothing to eat after midnight.     3. Hydration: You need to be well hydrated before your procedure. On November 4, you may drink approved liquids (see below) until 2 hours before the procedure, with 16 oz of water as your last intake.    List of approved liquids water, clear juice, clear tea, black coffee, fruit juices, non-citric and without pulp, carbonated beverages, Gatorade, Kool -Aid, plain Jello-O and plain ice popsicles.   4. Labs: You will need to have blood drawn today    5. Medication instructions in preparation for your procedure:    Contrast Allergy: No   Hold Metformin  that day of procedure and 48 hours after Hold Soliqua  the morning of procedure Hold Mounjaro  until after procedure    On the morning of your procedure, take your Aspirin 81 mg and any morning medicines NOT listed above.  You may use sips of water.   6. Plan to go home the same day, you will only stay overnight if medically necessary. 7. Bring a current list of your medications and current insurance cards. 8. You MUST  have a responsible person to drive you home. 9. Someone MUST be with you the first 24 hours after you arrive home or your discharge will be delayed. 10. Please wear clothes that are easy to get on and off and wear slip-on shoes.   Thank you for allowing us  to care for you!   -- [facility]

## 2024-08-02 NOTE — Telephone Encounter (Signed)
 Pt would like someone to call her and go over her procedure instructions because she has lost her paperwork.

## 2024-08-05 ENCOUNTER — Telehealth: Payer: Self-pay | Admitting: Primary Care

## 2024-08-05 ENCOUNTER — Telehealth: Payer: Self-pay | Admitting: Internal Medicine

## 2024-08-05 NOTE — Telephone Encounter (Signed)
 Copied from CRM #[id number]. Topic: Clinical - Medication Question >> Aug 05, 2024  9:02 AM Carlyon D wrote: Reason for CRM: Pt is calling stating she would like a call back in regards to medication question she she is worried about her blood sugar for the next 48 after getting her heart cath. Pt would like a call back asap as she gets this done tomorrow and cannot take her metformin  for 48 hours after heart cath. IF pt starts monjouro after she is scared her blood sugar will go sky high. Pt has npt started monjouro as she was finishing up soliqua , Pt is asking if she can get a box refilled of soliqua  60 units per day.   Please reach out to pt asap please.

## 2024-08-05 NOTE — Telephone Encounter (Signed)
 Called patient - advised not to drive or carry anything with that arm for at least 24 hours - patient verbalized understanding and agreement with plan

## 2024-08-05 NOTE — Telephone Encounter (Signed)
 Patient wants to know what to expect after her heart cath (what restrictions she will have) as later in the evening she wants to drive to visit her son.

## 2024-08-05 NOTE — Telephone Encounter (Signed)
 Spoke with Danielle Edwards. States that she has a heart cath scheduled for tomorrow. She was advised that she could not take metformin  for 48 hours after the cath. Mallie switched her from soliqua  to mounjaro  but the Danielle Edwards has not started mounjaro  as she did not want to waste the soliqua  she had on hand. She is supposed to start mounjaro  tomorrow. Danielle Edwards is worried that the low dose of mounjaro  is not going to control her blood sugar enough while not being able to take metformin . Danielle Edwards is asking for another prescription of soliqua  to be called in to keep her blood sugars under control.

## 2024-08-05 NOTE — Telephone Encounter (Signed)
 Spoke with pt and she is aware of Matt's response. States that she will just mounjaro  tomorrow and keep a check on her sugars. She will call if her sugars get too high.

## 2024-08-05 NOTE — Telephone Encounter (Signed)
 I think it wise to continue with the plan that her and kate have had. I am not going to refill the soliqua . Mallie will be back in office tomorrow if she wants to await her decision

## 2024-08-06 ENCOUNTER — Ambulatory Visit
Admission: RE | Admit: 2024-08-06 | Discharge: 2024-08-06 | Disposition: A | Discharge status: Home / Self Care | Attending: Internal Medicine | Admitting: Internal Medicine

## 2024-08-06 ENCOUNTER — Encounter: Payer: Self-pay | Admitting: Internal Medicine

## 2024-08-06 ENCOUNTER — Other Ambulatory Visit: Payer: Self-pay

## 2024-08-06 ENCOUNTER — Encounter
Admission: RE | Disposition: A | Discharge status: Home / Self Care | Payer: Self-pay | Source: Home / Self Care | Attending: Internal Medicine

## 2024-08-06 DIAGNOSIS — R079 Chest pain, unspecified: Secondary | ICD-10-CM | POA: Diagnosis present

## 2024-08-06 DIAGNOSIS — E785 Hyperlipidemia, unspecified: Secondary | ICD-10-CM | POA: Insufficient documentation

## 2024-08-06 DIAGNOSIS — K589 Irritable bowel syndrome without diarrhea: Secondary | ICD-10-CM | POA: Diagnosis not present

## 2024-08-06 DIAGNOSIS — Z7982 Long term (current) use of aspirin: Secondary | ICD-10-CM | POA: Insufficient documentation

## 2024-08-06 DIAGNOSIS — I2584 Coronary atherosclerosis due to calcified coronary lesion: Secondary | ICD-10-CM | POA: Diagnosis not present

## 2024-08-06 DIAGNOSIS — I10 Essential (primary) hypertension: Secondary | ICD-10-CM | POA: Diagnosis not present

## 2024-08-06 DIAGNOSIS — E1169 Type 2 diabetes mellitus with other specified complication: Secondary | ICD-10-CM | POA: Insufficient documentation

## 2024-08-06 DIAGNOSIS — Z6841 Body Mass Index (BMI) 40.0 and over, adult: Secondary | ICD-10-CM | POA: Insufficient documentation

## 2024-08-06 DIAGNOSIS — Z8551 Personal history of malignant neoplasm of bladder: Secondary | ICD-10-CM | POA: Insufficient documentation

## 2024-08-06 DIAGNOSIS — Z79899 Other long term (current) drug therapy: Secondary | ICD-10-CM | POA: Diagnosis not present

## 2024-08-06 DIAGNOSIS — I25110 Atherosclerotic heart disease of native coronary artery with unstable angina pectoris: Secondary | ICD-10-CM | POA: Insufficient documentation

## 2024-08-06 DIAGNOSIS — M797 Fibromyalgia: Secondary | ICD-10-CM | POA: Diagnosis not present

## 2024-08-06 DIAGNOSIS — Z794 Long term (current) use of insulin: Secondary | ICD-10-CM

## 2024-08-06 HISTORY — PX: LEFT HEART CATH AND CORONARY ANGIOGRAPHY: CATH118249

## 2024-08-06 LAB — GLUCOSE, CAPILLARY
Glucose-Capillary: 123 mg/dL — ABNORMAL HIGH (ref 70–99)
Glucose-Capillary: 107 mg/dL — ABNORMAL HIGH (ref 70–99)

## 2024-08-06 SURGERY — LEFT HEART CATH AND CORONARY ANGIOGRAPHY
Anesthesia: Moderate Sedation | Laterality: Left

## 2024-08-06 MED ORDER — ISOSORBIDE MONONITRATE ER 30 MG PO TB24: 30.0000 mg | ORAL_TABLET | Freq: Every day | ORAL | 11 refills | Status: DC

## 2024-08-06 MED ORDER — FENTANYL CITRATE (PF) 100 MCG/2ML IJ SOLN
INTRAMUSCULAR | Status: AC
  Filled 2024-08-06: qty 2

## 2024-08-06 MED ORDER — LIDOCAINE HCL (PF) 1 % IJ SOLN
INTRAMUSCULAR | Status: DC | PRN
Start: 2024-08-06 — End: 2024-08-06
  Administered 2024-08-06: 5 mL

## 2024-08-06 MED ORDER — HYDRALAZINE HCL 20 MG/ML IJ SOLN: 10.0000 mg | INTRAMUSCULAR | Status: DC | PRN

## 2024-08-06 MED ORDER — MIDAZOLAM HCL (PF) 2 MG/2ML IJ SOLN
INTRAMUSCULAR | Status: DC | PRN
Start: 2024-08-06 — End: 2024-08-06
  Administered 2024-08-06: 1 mg via INTRAVENOUS

## 2024-08-06 MED ORDER — LIDOCAINE HCL 1 % IJ SOLN
INTRAMUSCULAR | Status: AC
  Filled 2024-08-06: qty 20

## 2024-08-06 MED ORDER — ACETAMINOPHEN 325 MG PO TABS: 650.0000 mg | ORAL_TABLET | ORAL | Status: DC | PRN

## 2024-08-06 MED ORDER — ASPIRIN 81 MG PO CHEW
81.0000 mg | CHEWABLE_TABLET | ORAL | Status: AC
  Administered 2024-08-06: 81 mg via ORAL

## 2024-08-06 MED ORDER — SODIUM CHLORIDE 0.9% FLUSH: 3.0000 mL | Freq: Two times a day (BID) | INTRAVENOUS | Status: DC

## 2024-08-06 MED ORDER — MIDAZOLAM HCL 2 MG/2ML IJ SOLN
INTRAMUSCULAR | Status: AC
Start: 2024-08-06 — End: 2024-08-06
  Filled 2024-08-06: qty 2

## 2024-08-06 MED ORDER — SODIUM CHLORIDE 0.9% FLUSH: 3.0000 mL | INTRAVENOUS | Status: DC | PRN

## 2024-08-06 MED ORDER — SODIUM CHLORIDE 0.9 % IV SOLN: INTRAVENOUS | Status: DC

## 2024-08-06 MED ORDER — IOHEXOL 300 MG/ML  SOLN
INTRAMUSCULAR | Status: DC | PRN
  Administered 2024-08-06: 36 mL

## 2024-08-06 MED ORDER — HEPARIN SODIUM (PORCINE) 1000 UNIT/ML IJ SOLN
INTRAMUSCULAR | Status: DC | PRN
  Administered 2024-08-06: 5000 [IU] via INTRAVENOUS

## 2024-08-06 MED ORDER — VERAPAMIL HCL 2.5 MG/ML IV SOLN
INTRAVENOUS | Status: DC | PRN
  Administered 2024-08-06 (×2): 2.5 mg via INTRA_ARTERIAL

## 2024-08-06 MED ORDER — SODIUM CHLORIDE 0.9 % IV SOLN: INTRAVENOUS | Status: AC

## 2024-08-06 MED ORDER — HEPARIN (PORCINE) IN NACL 1000-0.9 UT/500ML-% IV SOLN
INTRAVENOUS | Status: DC | PRN
Start: 2024-08-06 — End: 2024-08-06
  Administered 2024-08-06 (×2): 500 mL

## 2024-08-06 MED ORDER — ONDANSETRON HCL 4 MG/2ML IJ SOLN: 4.0000 mg | Freq: Four times a day (QID) | INTRAMUSCULAR | Status: DC | PRN

## 2024-08-06 MED ORDER — VERAPAMIL HCL 2.5 MG/ML IV SOLN
INTRAVENOUS | Status: AC
  Filled 2024-08-06: qty 2

## 2024-08-06 MED ORDER — ASPIRIN 81 MG PO CHEW
CHEWABLE_TABLET | ORAL | Status: DC
Start: 2024-08-06 — End: 2024-08-06
  Filled 2024-08-06: qty 1

## 2024-08-06 MED ORDER — HEPARIN SODIUM (PORCINE) 1000 UNIT/ML IJ SOLN
INTRAMUSCULAR | Status: AC
  Filled 2024-08-06: qty 10

## 2024-08-06 MED ORDER — SODIUM CHLORIDE 0.9 % IV SOLN
250.0000 mL | INTRAVENOUS | Status: DC | PRN
Start: 2024-08-06 — End: 2024-08-06

## 2024-08-06 MED ORDER — LABETALOL HCL 5 MG/ML IV SOLN: 10.0000 mg | INTRAVENOUS | Status: DC | PRN

## 2024-08-06 MED ORDER — NITROGLYCERIN 0.4 MG SL SUBL: 0.4000 mg | SUBLINGUAL_TABLET | SUBLINGUAL | 99 refills | Status: AC | PRN

## 2024-08-06 MED ORDER — FENTANYL CITRATE (PF) 100 MCG/2ML IJ SOLN
INTRAMUSCULAR | Status: DC | PRN
  Administered 2024-08-06: 25 ug via INTRAVENOUS

## 2024-08-06 SURGICAL SUPPLY — 11 items
CATH INFINITI 5 FR JL3.5 (CATHETERS) IMPLANT
CATH INFINITI JR4 5F (CATHETERS) IMPLANT
DEVICE RAD COMP TR BAND LRG (VASCULAR PRODUCTS) IMPLANT
DEVICE RAD TR BAND REGULAR (VASCULAR PRODUCTS) IMPLANT
DRAPE BRACHIAL (DRAPES) IMPLANT
GLIDESHEATH SLEND SS 6F .021 (SHEATH) IMPLANT
GUIDEWIRE INQWIRE 1.5J.035X260 (WIRE) IMPLANT
PACK CARDIAC CATH (CUSTOM PROCEDURE TRAY) ×1 IMPLANT
PANNUS RETENTION SYSTEM 2 PAD (MISCELLANEOUS) IMPLANT
SET ATX-X65L (MISCELLANEOUS) IMPLANT
STATION PROTECTION PRESSURIZED (MISCELLANEOUS) IMPLANT

## 2024-08-06 NOTE — Interval H&P Note (Signed)
 History and Physical Interval Note:  08/06/2024 11:27 AM  Danielle Edwards  has presented today for surgery, with the diagnosis of coronary artery disease with unstable angina.  The various methods of treatment have been discussed with the patient and family. After consideration of risks, benefits and other options for treatment, the patient has consented to  Procedure(s): LEFT HEART CATH AND CORONARY ANGIOGRAPHY (Left) as a surgical intervention.  The patient's history has been reviewed, patient examined, no change in status, stable for surgery.  I have reviewed the patient's chart and labs.  Questions were answered to the patient's satisfaction.    Cath Lab Visit (complete for each Cath Lab visit)  Clinical Evaluation Leading to the Procedure:   ACS: No.  Non-ACS:    Anginal Classification: CCS IV  Anti-ischemic medical therapy: Maximal Therapy (2 or more classes of medications)  Non-Invasive Test Results: No non-invasive testing performed  Prior CABG: No previous CABG  Danielle Edwards

## 2024-08-06 NOTE — Progress Notes (Signed)
 Dr. Mady at bedside, speaking with pt. Pre cath. Pt. C/o anxiety: discusssing now. 2nd line placed by IV Team sec. To multiple (3) unsuccessful attempts by staff.

## 2024-08-07 ENCOUNTER — Encounter: Payer: Self-pay | Admitting: Internal Medicine

## 2024-08-07 LAB — GLUCOSE, CAPILLARY: Glucose-Capillary: 97 mg/dL (ref 70–99)

## 2024-08-09 ENCOUNTER — Ambulatory Visit: Payer: Self-pay

## 2024-08-09 NOTE — Heart Team MDD (Signed)
   Heart Team Multi-Disciplinary Discussion  Patient: Danielle Edwards  DOB: May 07, 1956  MRN: [id number]   Date: 08/09/2024  4:42 PM    Attendees: Interventional Cardiology: Lurena Red, MD Lonni Hanson, MD Cara Lovelace, MD Alm Clay, MD Ozell Fell, MD Newman Lawrence, MD Deatrice Cage, MD Debby Como, MD  Cardiothoracic Surgery: Linnie Rayas, MD   Additional Attendees: Con Clunes, MD   Patient History: 68 y.o. female with history of hypertension, hyperlipidemia, type 2 diabetes mellitus, fibromyalgia, IBS, anemia, and suspected obstructive sleep apnea.   Risk Factors: Diabetes Mellitus Hypertension Hyperlipidemia   CAD History: A CT scan ordered by her primary care provider revealed a calcium  score of 950, placing her in the 97th percentile for her age group.   Review of Prior Angiography and PCI Procedures: Cardiac cath from 08/06/24 was reviewed in detail.   Discussion: The team weighed the risk and benefit options of high risk complex bifurcation lesion, CABG and medical therapy. It was felt the patient was not recommended for CABG and PCI was not an option at this time.   Recommendations: Medical Therapy     Shona Palma, RN  08/09/2024 4:42 PM

## 2024-08-12 DIAGNOSIS — I10 Essential (primary) hypertension: Secondary | ICD-10-CM | POA: Diagnosis not present

## 2024-08-12 DIAGNOSIS — R600 Localized edema: Secondary | ICD-10-CM | POA: Diagnosis not present

## 2024-08-12 DIAGNOSIS — E1122 Type 2 diabetes mellitus with diabetic chronic kidney disease: Secondary | ICD-10-CM | POA: Diagnosis not present

## 2024-08-14 ENCOUNTER — Telehealth: Payer: Self-pay | Admitting: Primary Care

## 2024-08-14 DIAGNOSIS — N1832 Chronic kidney disease, stage 3b: Secondary | ICD-10-CM | POA: Diagnosis not present

## 2024-08-14 DIAGNOSIS — E1122 Type 2 diabetes mellitus with diabetic chronic kidney disease: Secondary | ICD-10-CM | POA: Diagnosis not present

## 2024-08-14 DIAGNOSIS — R600 Localized edema: Secondary | ICD-10-CM | POA: Diagnosis not present

## 2024-08-14 DIAGNOSIS — N189 Chronic kidney disease, unspecified: Secondary | ICD-10-CM | POA: Diagnosis not present

## 2024-08-14 DIAGNOSIS — I10 Essential (primary) hypertension: Secondary | ICD-10-CM | POA: Diagnosis not present

## 2024-08-14 NOTE — Telephone Encounter (Signed)
 Please call patient:  Was she ever able to start the Mounjaro ?  It does not seem so.  What are her blood sugars running and at what time?

## 2024-08-14 NOTE — Telephone Encounter (Signed)
 Copied from CRM [id number]. Topic: General - Other >> Aug 14, 2024 12:14 PM Burnard DEL wrote: Reason for CRM: Patient called in requesting a phone call to discuss dosage of mounjaro  and soliqua  medications. Patient stated that she may need to be back on the soliqua  due to her blood sugar levels being increased lately,and Dr Dennise is concerned about this. She also would like to discuss the heart stent that she was suppose to have done. She also stated that her heart doctor has placed her on two new medications as well. She would like for provider to review her office visit notes from her kidney doctor and heart doctor.

## 2024-08-15 ENCOUNTER — Telehealth: Payer: Self-pay

## 2024-08-15 DIAGNOSIS — E1165 Type 2 diabetes mellitus with hyperglycemia: Secondary | ICD-10-CM

## 2024-08-15 NOTE — Telephone Encounter (Signed)
 Spoke with pt asking Danielle Edwards's questions. Pt states she is currently taking Mounjaro . However, she wasn't in a place to be able to provide recent BS reading. Pt asks if Danielle Edwards sends rx, pls send to:  [facility] [address]  [place], [place] [address]

## 2024-08-15 NOTE — Telephone Encounter (Unsigned)
 Copied from CRM [id number]. Topic: Clinical - Prescription Issue >> Aug 15, 2024  4:10 PM Rea ORN wrote: Reason for CRM: Pt stated her sugar is 165 now, 3 hour ago she had a banana and walnuts.  Pt stated she does not want to take Jardiance . The Jardiance  gave her a bad yeast infection last time.  Nephrologist said to increase Mounjaro  or to take soliqua  again. Her insurance will cover Soliqua .   Please call back [phone]

## 2024-08-15 NOTE — Telephone Encounter (Signed)
 Copied from CRM #[id number]. Topic: Clinical - Medication Question >> Aug 15, 2024 12:15 PM Viola F wrote: Patient called to follow up on message that was sent to the clinic yesterday. She answered Katherine's Gretta questions: Was she ever able to start the Mounjaro ?  Yes  What are her blood sugars running and at what time?   08/14/24 Wednesday  1:32am - 147 2:34AM - 168 9:15AM - 143 11:43AM - 148  12:10PM - 150 5:52PM - 188  08/15/24 Thursday  8:19AM -134 12:10pm- 180  Patients kidney doctor recommends for patient to take a low dose of the SOLIQUA  or increase the dosage of Mounjaro ? She would also like to know the provider thoughts about the Jardiance  medication. Please call her at [phone] (M)

## 2024-08-15 NOTE — Telephone Encounter (Addendum)
 My instructions during her last visit was to take Mounjaro  2.5 mg once weekly x 4 weeks then message me/call me when she has her last 2.5 pen so I can send the the 5 mg dose to her pharmacy.  Has she finished her last 2.5 mg pen?  Soliqua  in addition to Mounjaro  is contraindicated.

## 2024-08-15 NOTE — Telephone Encounter (Signed)
 There are now 2 messages floating around regarding this topic.  Per our visit recently she was to start Mounjaro  2.5 mg weekly x 4 weeks, then increase to 5 mg weekly thereafter.  She was to notify me when she used her last 2.5 mg pen.  I will not be prescribing Soliqua  as it is contraindicated with Mounjaro .  Did she use her last 2.5 mg Mounjaro  pen?  If so, I will send the 5 mg dose now.

## 2024-08-16 NOTE — Telephone Encounter (Signed)
 See other 08/15/24 phn note.

## 2024-08-16 NOTE — Telephone Encounter (Signed)
 Spoke with pt relaying [name]'s message. Pt verbalizes understanding and states she just finished Soliqua  and took 1st Mounjaro  shot 08/10/24. She will continue to log BS and will let Mallie know when she's on last 2.5 mg pen.

## 2024-08-16 NOTE — Telephone Encounter (Signed)
 Noted

## 2024-08-19 ENCOUNTER — Other Ambulatory Visit: Payer: Self-pay | Admitting: Primary Care

## 2024-08-19 DIAGNOSIS — E1165 Type 2 diabetes mellitus with hyperglycemia: Secondary | ICD-10-CM

## 2024-08-19 MED ORDER — EMPAGLIFLOZIN 10 MG PO TABS: 10.0000 mg | ORAL_TABLET | Freq: Every day | ORAL | 0 refills | Status: AC

## 2024-08-19 MED ORDER — EMPAGLIFLOZIN 10 MG PO TABS: 10.0000 mg | ORAL_TABLET | Freq: Every day | ORAL | 0 refills | Status: DC

## 2024-08-19 NOTE — Addendum Note (Signed)
 Addended by: Terrill Wauters K on: 08/19/2024 07:05 PM   Modules accepted: Orders

## 2024-08-19 NOTE — Telephone Encounter (Signed)
 Please call patient:  Remind her to notify me once she uses her last Mounjaro  2.5 mg pen so that I can send the next dose to her pharmacy.  We can try low dose Jardiance  to assist with her glucose levels. I will send this to [facility] pharmacy now.  We will see her in December as scheduled

## 2024-08-19 NOTE — Telephone Encounter (Unsigned)
 Copied from CRM #[id number]. Topic: Clinical - Medical Advice >> Aug 19, 2024 11:25 AM Danielle Edwards wrote: Reason for CRM: Calling in to report blood sugar numbers and request change in medication/ addition of Jardiance  to help address.  On Mounjaro  as of 2 weeks ago, sugar has been high, kidney doctor was concerned, has been keeping a check on it, but thinks she needs something added, potentially higher dose, or low dose insulin , or jardiance  (because of kidney function, have taken previously and also helped with sugar).  Not feeling well in the evenings, Recent stint - 80% blockage.  Don't wont to stop Mounjaro  - thinks it will be better with higher dose but concerned about recent numbers, especially with careful eating, avoiding sugar:  Since second dose Saturday -  Nov 15 - 1AM - 162 330 AM - 199 (8 hours fasting) 400 - 184 430 - 174 730 - 166 848 - 175 10- 166 11 - 155 12PM - 167 (Very little food, no sugar)   16th 1AM - 144 930 - 174 1 - 178 3PM - 164 830 - 173 11 - 155 (small salad, eating light)  17th  3AM - 151 5 - 155 830 - 136 10 - 146

## 2024-08-20 NOTE — Telephone Encounter (Signed)
 Spoke with pt relaying [name]'s message. Pt verbalizes understanding and will pick up Jardiance  rx.

## 2024-08-23 ENCOUNTER — Ambulatory Visit: Admitting: Student

## 2024-09-02 ENCOUNTER — Other Ambulatory Visit: Payer: Self-pay | Admitting: Primary Care

## 2024-09-02 ENCOUNTER — Telehealth: Payer: Self-pay | Admitting: Primary Care

## 2024-09-02 DIAGNOSIS — E1165 Type 2 diabetes mellitus with hyperglycemia: Secondary | ICD-10-CM

## 2024-09-02 DIAGNOSIS — Z794 Long term (current) use of insulin: Secondary | ICD-10-CM

## 2024-09-02 DIAGNOSIS — I10 Essential (primary) hypertension: Secondary | ICD-10-CM

## 2024-09-02 NOTE — Progress Notes (Deleted)
 Cardiology Clinic Note   Date: 09/02/2024 ID: Danielle Edwards, DOB 1956-07-09, MRN [id number]  Primary Cardiologist:  None  Chief Complaint   Danielle Edwards is a 68 y.o. female who presents to the clinic today for ***  Patient Profile   Danielle Edwards is followed by Dr. Mady for the history outlined below.      Past medical history significant for: CAD. LHC 08/06/2024: Severe single-vessel CAD with 80% ostial LCx stenosis.  There is mild to moderate LMCA, LAD and RCA disease.  Low normal LV systolic function 50 to 55% with possible mid anterior hypokinesis.  Recommend adding isosorbide  for antianginal therapy and aggressive secondary prevention. Hypertension. Hyperlipidemia. Lipid panel 06/19/2024: LDL 123, HDL 53, TG 113, total 198. CT cardiac scoring 06/26/2024: Coronary calcium  score 953 (97 percentile). GERD. Hypothyroidism. T2DM. CKD stage III. Fibromyalgia. Bladder cancer. In remission.  In summary, patient was first evaluated by Dr. Mady on 07/31/2024 for chest discomfort and coronary artery calcifications.  Patient reported a 2-year history of chest discomfort accompanied by pain in the middle of her back.  She was unsure if exertion worsened pain due to limited activity.  She describes dyspnea with walking quickly as well as occasional skipped beats.  She had previously been prescribed propranolol  which she was taking at night and felt it helped her chest discomfort.  She underwent CT cardiac scoring ordered by PCP which demonstrated calcium  score of 953.  She had previously been followed by a cardiologist in [place], [place] approximately 15 years prior and underwent normal treadmill test and heart catheterization.  She was having chest pain at the time of her visit and presenting to the ED was discussed but deferred.  She did have a troponin drawn which was negative.  She underwent LHC which demonstrated severe single-vessel CAD.  Aggressive secondary prevention was recommended.  Case  was discussed with heart team and it was felt CABG was not recommended and PCI was not an option.     History of Present Illness    Today, patient ***  CAD LHC November 2025 demonstrated severe single-vessel CAD with 80% ostial LCx stenosis, mild to moderate LMCA, LAD and RCA disease.  Patient*** - Continue aspirin , propranolol , isosorbide , atorvastatin .  Hypertension BP today*** - Continue propranolol , isosorbide , amlodipine .  Hyperlipidemia CT cardiac scoring September 2025 demonstrated coronary calcium  score of 953.  LDL 1 25 June 2024, not at goal. - Continue atorvastatin . - Repeat lipid panel and CMP***  ROS: All other systems reviewed and are otherwise negative except as noted in History of Present Illness.  EKGs/Labs Reviewed        06/19/2024: ALT 14; AST 13 07/31/2024: BUN 31; Creatinine, Ser 1.36; Potassium 4.7; Sodium 141   07/31/2024: Hemoglobin 11.5; WBC 8.3   No results found for requested labs within last 365 days.   No results found for requested labs within last 365 days.  ***  Risk Assessment/Calculations    {Does this patient have ATRIAL FIBRILLATION?:[phone]} No BP recorded.  {Refresh Note OR Click here to enter BP  :1}***        Physical Exam    VS:  There were no vitals taken for this visit. , BMI There is no height or weight on file to calculate BMI.  GEN: Well nourished, well developed, in no acute distress. Neck: No JVD or carotid bruits. Cardiac: *** RRR. *** No murmur. No rubs or gallops.   Respiratory:  Respirations regular and unlabored. Clear to auscultation without rales, wheezing or rhonchi.  GI: Soft, nontender, nondistended. Extremities: Radials/DP/PT 2+ and equal bilaterally. No clubbing or cyanosis. No edema ***  Skin: Warm and dry, no rash. Neuro: Strength intact.  Assessment & Plan   ***  Disposition: ***     {Are you ordering a CV Procedure (e.g. stress test, cath, DCCV, TEE, etc)?   Press F2        :[id number]}    Signed, Barnie HERO. Izen Petz, DNP, NP-C

## 2024-09-02 NOTE — Telephone Encounter (Unsigned)
 Copied from CRM [id number]. Topic: Clinical - Medication Refill >> Sep 02, 2024  9:46 AM Thersia C wrote: Medication: tirzepatide  (MOUNJARO ) 2.5 MG/0.5ML Pen - HAS FINISHED HER LAST PEN , STATED IT IS GOING GOD HER SUGAR IS GETTING MUCH BETTER IN THE MORNING   metFORMIN  (GLUCOPHAGE -XR) 500 MG 24 hr tablet  Has the patient contacted their pharmacy? Yes (Agent: If no, request that the patient contact the pharmacy for the refill. If patient does not wish to contact the pharmacy document the reason why and proceed with request.) (Agent: If yes, when and what did the pharmacy advise?)  This is the patient's preferred pharmacy:  [facility] - [place], [place] - [address] [place] [address] Phone: [phone] Fax: [phone]  Is this the correct pharmacy for this prescription? Yes If no, delete pharmacy and type the correct one.   Has the prescription been filled recently? No  Is the patient out of the medication? Yes  Has the patient been seen for an appointment in the last year OR does the patient have an upcoming appointment? Yes  Can we respond through MyChart? Yes  Agent: Please be advised that Rx refills may take up to 3 business days. We ask that you follow-up with your pharmacy.

## 2024-09-03 NOTE — Telephone Encounter (Signed)
 Refills sent to pharmacy on 09/02/24

## 2024-09-04 ENCOUNTER — Ambulatory Visit: Attending: Student | Admitting: Student

## 2024-09-04 ENCOUNTER — Encounter: Payer: Self-pay | Admitting: Student

## 2024-09-04 ENCOUNTER — Ambulatory Visit: Admitting: Student

## 2024-09-04 VITALS — BP 130/84 | HR 69 | Resp 20 | Ht 66.0 in | Wt 254.0 lb

## 2024-09-04 DIAGNOSIS — I10 Essential (primary) hypertension: Secondary | ICD-10-CM

## 2024-09-04 DIAGNOSIS — R0609 Other forms of dyspnea: Secondary | ICD-10-CM

## 2024-09-04 DIAGNOSIS — I25118 Atherosclerotic heart disease of native coronary artery with other forms of angina pectoris: Secondary | ICD-10-CM | POA: Diagnosis not present

## 2024-09-04 DIAGNOSIS — E785 Hyperlipidemia, unspecified: Secondary | ICD-10-CM | POA: Diagnosis not present

## 2024-09-04 MED ORDER — ISOSORBIDE MONONITRATE ER 30 MG PO TB24: 15.0000 mg | ORAL_TABLET | Freq: Every evening | ORAL | 3 refills | Status: AC

## 2024-09-04 NOTE — Progress Notes (Signed)
 Cardiology Clinic Note   Date: 09/04/2024 ID: Adela Esteban, DOB 01/13/1956, MRN [id number]  Primary Cardiologist:  Lonni Hanson, MD  Chief Complaint   Danielle Edwards is a 68 y.o. female who presents to the clinic today for follow up after   Patient Profile   Lundyn Coste is followed by Dr. Hanson for the history outlined below.      Past medical history significant for: CAD. LHC 08/06/2024: Severe single-vessel CAD with 80% ostial LCx stenosis.  There is mild to moderate LMCA, LAD and RCA disease.  Low normal LV systolic function 50 to 55% with possible mid anterior hypokinesis.  Recommend adding isosorbide  for antianginal therapy and aggressive secondary prevention. Hypertension. Hyperlipidemia. Lipid panel 06/19/2024: LDL 123, HDL 53, TG 113, total 198. CT cardiac scoring 06/26/2024: Coronary calcium  score 953 (97 percentile). GERD. Hypothyroidism. T2DM. CKD stage III. Fibromyalgia. Bladder cancer. In remission.  In summary, patient was first evaluated by Dr. Hanson on 07/31/2024 for chest discomfort and coronary artery calcifications.  Patient reported a 2-year history of chest discomfort accompanied by pain in the middle of her back.  She was unsure if exertion worsened pain due to limited activity.  She describes dyspnea with walking quickly as well as occasional skipped beats.  She had previously been prescribed propranolol  which she was taking at night and felt it helped her chest discomfort.  She underwent CT cardiac scoring ordered by PCP which demonstrated calcium  score of 953.  She had previously been followed by a cardiologist in [place], [place] approximately 15 years prior and underwent normal treadmill test and heart catheterization.  She was having chest pain at the time of her visit and presenting to the ED was discussed but deferred.  She did have a troponin drawn which was negative.  She underwent LHC which demonstrated severe single-vessel CAD.  Aggressive secondary  prevention was recommended.  Case was discussed with heart team and it was felt CABG was not recommended and PCI was not an option.     History of Present Illness    Today, patient reports continued chest pain that is constant and unchanged from previous. She does feel it is slightly better since starting isosorbide . She notices it more at night when she is laying in bed. She states it starts with an anxious feeling then seems worse. She also reports increased shortness of breath that is worse than her baseline dyspnea with exertion. She is working on weight loss and increasing physical activity.  Her right wrist cath site is well healed with no drainage or tenderness. Discussed results of LHC and recommendations of heart team in detail. All questions answered.     ROS: All other systems reviewed and are otherwise negative except as noted in History of Present Illness.  EKGs/Labs Reviewed       EKG is not performed today.   06/19/2024: ALT 14; AST 13 07/31/2024: BUN 31; Creatinine, Ser 1.36; Potassium 4.7; Sodium 141   07/31/2024: Hemoglobin 11.5; WBC 8.3    Physical Exam    VS:  BP 130/84 (BP Location: Left Wrist, Patient Position: Sitting, Cuff Size: Normal)   Pulse 69   Resp 20   Ht 5' 6 (1.676 m)   Wt 254 lb (115.2 kg)   SpO2 98%   BMI 41.00 kg/m  , BMI Body mass index is 41 kg/m.  GEN: Well nourished, well developed, in no acute distress. Neck: No JVD or carotid bruits. Cardiac:  RRR.  No murmur. No rubs or gallops.  Respiratory:  Respirations regular and unlabored. Clear to auscultation without rales, wheezing or rhonchi. GI: Soft, nontender, nondistended. Extremities: Radials/DP/PT 2+ and equal bilaterally. No clubbing or cyanosis. No edema   Skin: Warm and dry, no rash. Neuro: Strength intact.  Assessment & Plan   CAD/DOE [facility] November 2025 demonstrated severe single-vessel CAD with 80% ostial LCx stenosis, mild to moderate LMCA, LAD and RCA disease.  Patient  reports continued constant chest pain that is somewhat better during the day and seems a little worse at night. She states it starts with an anxious feeling then worsened chest pain. She also reports increased dyspnea worse than baseline DOE. She feels she is getting winded with less exertion. She is actively working on weight loss with Mounjaro . She would like to increase physical activity. Discussed starting with walking maybe splitting it up to 10 minutes in the morning and 10 minutes in the evening and slowly increasing. Right wrist cath site is well healed without erythema, edema, ecchymosis, drainage or tenderness to palpation. 2+ radial pulse proximal and distal to site.  - Continue isosorbide  30 mg in the morning and add 15 mg in the evening.  - Schedule echo (can be performed in [place], as it is closer to her new home.  - Continue aspirin , propranolol , atorvastatin .  Hypertension BP today 130/84.  - Continue propranolol , isosorbide , amlodipine .  Hyperlipidemia CT cardiac scoring September 2025 demonstrated coronary calcium  score of 953.  LDL 123 September 2025, not at goal. She started the atorvastatin  about 6 weeks ago.  - Continue atorvastatin . - Repeat lipid panel per PCP in 2 weeks.   Disposition: Add 15 mg isosorbide  in the evening. Schedule echo. Return in 3 months or sooner as needed.          Signed, Barnie HERO. Dahir Ayer, DNP, NP-C

## 2024-09-04 NOTE — Patient Instructions (Signed)
 Medication Instructions:   Your physician recommends the following medication changes.   INCREASE: Isosorbide  Mononitrate (Imdur ) an additional 15 mg (1/2 of 30 mg tablet) every night.  This is in addition to the 30 mg (1 tablet) daily during the day.   *If you need a refill on your cardiac medications before your next appointment, please call your pharmacy*  Lab Work:  None ordered at this time   If you have labs (blood work) drawn today and your tests are completely normal, you will receive your results only by:  MyChart Message (if you have MyChart) OR  A paper copy in the mail If you have any lab test that is abnormal or we need to change your treatment, we will call you to review the results.  Testing/Procedures:  Your physician has requested that you have an echocardiogram. Echocardiography is a painless test that uses sound waves to create images of your heart. It provides your doctor with information about the size and shape of your heart and how well your heart's chambers and valves are working.   You may receive an ultrasound enhancing agent through an IV if needed to better visualize your heart during the echo. This procedure takes approximately one hour.  There are no restrictions for this procedure.  This will take place at [facility]  Please note: We ask at that you not bring children with you during ultrasound (echo/ vascular) testing. Due to room size and safety concerns, children are not allowed in the ultrasound rooms during exams. Our front office staff cannot provide observation of children in our lobby area while testing is being conducted. An adult accompanying a patient to their appointment will only be allowed in the ultrasound room at the discretion of the ultrasound technician under special circumstances. We apologize for any inconvenience.   Referrals:  None ordered at this time   Follow-Up:  At [facility], you and your health needs  are our priority.  As part of our continuing mission to provide you with exceptional heart care, our providers are all part of one team.  This team includes your primary Cardiologist (physician) and Advanced Practice Providers or APPs (Physician Assistants and Nurse Practitioners) who all work together to provide you with the care you need, when you need it.  Your next appointment:   3 month(s)  Provider:    Lonni Hanson, MD or Barnie Hila, NP    We recommend signing up for the patient portal called MyChart.  Sign up information is provided on this After Visit Summary.  MyChart is used to connect with patients for Virtual Visits (Telemedicine).  Patients are able to view lab/test results, encounter notes, upcoming appointments, etc.  Non-urgent messages can be sent to your provider as well.   To learn more about what you can do with MyChart, go to [email or web address].

## 2024-09-05 DIAGNOSIS — R519 Headache, unspecified: Secondary | ICD-10-CM | POA: Diagnosis not present

## 2024-09-05 DIAGNOSIS — M797 Fibromyalgia: Secondary | ICD-10-CM | POA: Diagnosis not present

## 2024-09-05 DIAGNOSIS — M5382 Other specified dorsopathies, cervical region: Secondary | ICD-10-CM | POA: Diagnosis not present

## 2024-09-18 ENCOUNTER — Ambulatory Visit: Admitting: Primary Care

## 2024-09-18 VITALS — BP 126/78 | HR 78 | Temp 97.9°F | Ht 66.0 in | Wt 248.4 lb

## 2024-09-18 DIAGNOSIS — E785 Hyperlipidemia, unspecified: Secondary | ICD-10-CM

## 2024-09-18 DIAGNOSIS — E1169 Type 2 diabetes mellitus with other specified complication: Secondary | ICD-10-CM | POA: Diagnosis not present

## 2024-09-18 DIAGNOSIS — E1165 Type 2 diabetes mellitus with hyperglycemia: Secondary | ICD-10-CM

## 2024-09-18 DIAGNOSIS — Z7985 Long-term (current) use of injectable non-insulin antidiabetic drugs: Secondary | ICD-10-CM

## 2024-09-18 LAB — POCT GLYCOSYLATED HEMOGLOBIN (HGB A1C): Hemoglobin A1C: 7.1 % — AB (ref 4.0–5.6)

## 2024-09-18 LAB — LIPID PANEL
Cholesterol: 93 mg/dL (ref 28–200)
HDL: 38.4 mg/dL — ABNORMAL LOW (ref >39.00)
LDL Cholesterol: 25 mg/dL (ref 10–99)
NonHDL: 54.29
Total CHOL/HDL Ratio: 2
Triglycerides: 146 mg/dL (ref 10.0–149.0)
VLDL: 29.2 mg/dL (ref 0.0–40.0)

## 2024-09-18 LAB — HEPATIC FUNCTION PANEL
ALT: 21 U/L (ref 3–35)
AST: 20 U/L (ref 5–37)
Albumin: 4.4 g/dL (ref 3.5–5.2)
Alkaline Phosphatase: 81 U/L (ref 39–117)
Bilirubin, Direct: 0.1 mg/dL (ref 0.1–0.3)
Total Bilirubin: 0.4 mg/dL (ref 0.2–1.2)
Total Protein: 7.3 g/dL (ref 6.0–8.3)

## 2024-09-18 NOTE — Patient Instructions (Addendum)
 Stop by the lab prior to leaving today. I will notify you of your results once received.   Continue Mounjaro  5 mg weekly, metformin , Jardiance  for diabetes  Please schedule a follow up visit for 3 months.  It was a pleasure to see you today!

## 2024-09-18 NOTE — Assessment & Plan Note (Signed)
 Improving with A1C of 7.1 today!  Continue to work on diet.  Continue Mounjaro  at 5 mg daily for now. May require a dose adjustment at some point. She will update if glucose readings are rising and/or if her cravings return.   Continue metformin  ER 2000 mg daily for now. Continue Jardiance  10 mg daily.  Follow up in 3 months.

## 2024-09-18 NOTE — Progress Notes (Signed)
 Subjective:    Patient ID: Danielle Edwards, female    DOB: September 09, 1956, 68 y.o.   MRN: [id number]  Danielle Edwards is a very pleasant 68 y.o. female with a history of type 2 diabetes, hypertension, CAD, hypothyroidism, hyperlipidemia who presents today for follow-up of diabetes.  She is also due for repeat lipid panel since resuming atorvastatin   1) Type 2 Diabetes:  Current medications include: Mounjaro  5 mg weekly, Jardiance  10 mg daily, metformin  2000 mg daily.   She began Mounjaro  about 6 weeks ago. She is tolerating Mounjaro  well.   She is checking her blood glucose continuously and is getting readings ranging   Time in range last 7 days: 91% Time in range last 14 days: 94% Time in range last 30 days: 95%  Last A1C: 7.7 in September 2025, 7.1 today Last Eye Exam: Due Last Foot Exam: Up-to-date Pneumonia Vaccination: Up-to-date Urine Microalbumin: Up-to-date Statin: Atorvastatin   Dietary changes since last visit: She is eating less in general, less intake of sweets. Increased intake of fruit and lean protein.    Exercise: None  BP Readings from Last 3 Encounters:  09/18/24 126/78  09/04/24 130/84  08/06/24 (!) 109/44    Wt Readings from Last 3 Encounters:  09/18/24 248 lb 6 oz (112.7 kg)  09/04/24 254 lb (115.2 kg)  08/06/24 265 lb 1.6 oz (120.2 kg)      Review of Systems  Respiratory:  Negative for shortness of breath.   Cardiovascular:  Negative for chest pain.  Neurological:  Negative for dizziness and numbness.         Past Medical History:  Diagnosis Date   Allergy    seasonal allergies   Anemia    hx of IDA   Anxiety    on meds   Arthritis    PRN meds-osteoarthritis   Asthma    Cataract    sx to remove   Chronic kidney disease    CKD-stage 3   Chronic pain    COVID-19 06/02/2020   Depression    on meds   Diabetes type 2, controlled (HCC)    on meds   Fatigue 09/17/2019   Fibromyalgia    GERD (gastroesophageal reflux disease)    on  meds   Headache    History of colonic polyps 1999   Benign   Hx of adenomatous polyp of colon 09/27/2000   2001 - diminutive adenoma [place] 2007 no polyps - [place]   Hyperlipidemia    on meds   Hypertension    on meds   Hypothyroidism    not on meds at this time   IBS (irritable bowel syndrome)    Diarrhea type   IDA (iron  deficiency anemia) 02/09/2022   Neuromuscular disorder (HCC)    Sleep apnea    never tested but cant lay flat   Vitamin D  deficiency     Social History   Socioeconomic History   Marital status: Married    Spouse name: Sova,Jesse (Spouse)   Number of children: Not on file   Years of education: Not on file   Highest education level: GED or equivalent  Occupational History   Not on file  Tobacco Use   Smoking status: Never   Smokeless tobacco: Never  Vaping Use   Vaping status: Never Used  Substance and Sexual Activity   Alcohol use: Yes    Alcohol/week: 0.0 standard drinks of alcohol    Comment: rarely   Drug use: No   Sexual activity: Not  on file  Other Topics Concern   Not on file  Social History Narrative   Married.   3 children.   Retired, worked at [employer] in [place], [place].   Enjoys being with her grandchildren, being with her friends.   Social Drivers of Health   Tobacco Use: Low Risk (09/18/2024)   Patient History    Smoking Tobacco Use: Never    Smokeless Tobacco Use: Never    Passive Exposure: Not on file  Financial Resource Strain: Low Risk (09/18/2024)   Overall Financial Resource Strain (CARDIA)    Difficulty of Paying Living Expenses: Not very hard  Food Insecurity: No Food Insecurity (09/18/2024)   Epic    Worried About Programme Researcher, Broadcasting/film/video in the Last Year: Never true    Ran Out of Food in the Last Year: Never true  Transportation Needs: No Transportation Needs (09/18/2024)   Epic    Lack of Transportation (Medical): No    Lack of Transportation (Non-Medical): No  Physical Activity: Inactive (09/18/2024)    Exercise Vital Sign    Days of Exercise per Week: 0 days    Minutes of Exercise per Session: Not on file  Stress: Stress Concern Present (09/18/2024)   [employer] of Occupational Health - Occupational Stress Questionnaire    Feeling of Stress: To some extent  Social Connections: Socially Integrated (09/18/2024)   Social Connection and Isolation Panel    Frequency of Communication with Friends and Family: More than three times a week    Frequency of Social Gatherings with Friends and Family: Three times a week    Attends Religious Services: More than 4 times per year    Active Member of Clubs or Organizations: Yes    Attends Banker Meetings: More than 4 times per year    Marital Status: Married  Catering Manager Violence: Not At Risk (03/08/2023)   Humiliation, Afraid, Rape, and Kick questionnaire    Fear of Current or Ex-Partner: No    Emotionally Abused: No    Physically Abused: No    Sexually Abused: No  Depression (PHQ2-9): Low Risk (06/19/2024)   Depression (PHQ2-9)    PHQ-2 Score: 0  Alcohol Screen: Low Risk (03/08/2023)   Alcohol Screen    Last Alcohol Screening Score (AUDIT): 0  Housing: Unknown (09/18/2024)   Epic    Unable to Pay for Housing in the Last Year: No    Number of Times Moved in the Last Year: Not on file    Homeless in the Last Year: No  Utilities: Not At Risk (03/08/2023)   [facility] Utilities    Threatened with loss of utilities: No  Health Literacy: Not on file    Past Surgical History:  Procedure Laterality Date   CHOLECYSTECTOMY  1995   COLONOSCOPY  multiple   COLONOSCOPY  2016   TA's   EYE SURGERY Bilateral    cataracts   LEFT HEART CATH AND CORONARY ANGIOGRAPHY Left 08/06/2024   Procedure: LEFT HEART CATH AND CORONARY ANGIOGRAPHY;  Surgeon: Mady Bruckner, MD;  Location: [facility] INVASIVE CV LAB;  Service: Cardiovascular;  Laterality: Left;   NASAL SINUS SURGERY  2001   POLYPECTOMY     TA's   TONSILLECTOMY     TONSILLECTOMY AND  ADENOIDECTOMY     TRANSURETHRAL RESECTION OF BLADDER TUMOR N/A 08/08/2023   Procedure: TRANSURETHRAL RESECTION OF BLADDER TUMOR (TURBT) w/ INSTILLATION OF GEMCITABINE ;  Surgeon: Twylla Glendia BROCKS, MD;  Location: [facility] ORS;  Service: Urology;  Laterality: N/A;  VAGINAL HYSTERECTOMY     Complete   WISDOM TOOTH EXTRACTION      Family History  Problem Relation Age of Onset   Colon cancer Mother 81   Lung cancer Mother 50   Skin cancer Mother    Colon polyps Mother 37   Heart disease Father    Diabetes Father    Skin cancer Father    Heart failure Father    Colon polyps Brother 48   Heart murmur Brother    Kidney disease Paternal Aunt    Heart disease Maternal Grandmother    Stomach cancer Paternal Grandmother 40   Breast cancer Neg Hx    Esophageal cancer Neg Hx    Rectal cancer Neg Hx     Allergies[1]  Medications Ordered Prior to Encounter[2]  BP 126/78   Pulse 78   Temp 97.9 F (36.6 C) (Oral)   Ht 5' 6 (1.676 m)   Wt 248 lb 6 oz (112.7 kg)   SpO2 96%   BMI 40.09 kg/m  Objective:   Physical Exam Cardiovascular:     Rate and Rhythm: Normal rate and regular rhythm.  Pulmonary:     Effort: Pulmonary effort is normal.     Breath sounds: Normal breath sounds.  Musculoskeletal:     Cervical back: Neck supple.  Skin:    General: Skin is warm and dry.  Neurological:     Mental Status: She is alert and oriented to person, place, and time.  Psychiatric:        Mood and Affect: Mood normal.     Physical Exam        Assessment & Plan:  Type 2 diabetes mellitus with hyperglycemia, without long-term current use of insulin  (HCC) Assessment & Plan: Improving with A1C of 7.1 today!  Continue to work on diet.  Continue Mounjaro  at 5 mg daily for now. May require a dose adjustment at some point. She will update if glucose readings are rising and/or if her cravings return.   Continue metformin  ER 2000 mg daily for now. Continue Jardiance  10 mg daily.  Follow up  in 3 months.  Orders: -     POCT glycosylated hemoglobin (Hb A1C)  Hyperlipidemia LDL goal <100 -     Lipid panel -     Hepatic function panel  Hyperlipidemia associated with type 2 diabetes mellitus (HCC) Assessment & Plan: Compliant to atorvastatin  20 mg daily. Repeat lipid panel pending     Assessment and Plan Assessment & Plan         Comer MARLA Gaskins, NP       [1] No Known Allergies [2]  Current Outpatient Medications on File Prior to Visit  Medication Sig Dispense Refill   Accu-Chek FastClix Lancets MISC USE AS INSTRUCTED TO CHECK BLOOD SUGAR UP TO 3 TIMES DAILY 306 each 1   amLODipine  (NORVASC ) 10 MG tablet TAKE 1 TABLET (10 MG TOTAL) BY MOUTH DAILY. FOR BLOOD PRESSURE. 90 tablet 2   aspirin  EC 81 MG tablet Take 81 mg by mouth daily.     atorvastatin  (LIPITOR) 20 MG tablet Take 1 tablet (20 mg total) by mouth daily. for cholesterol. 90 tablet 3   Blood Glucose Monitoring Suppl DEVI 1 each by Does not apply route in the morning, at noon, and at bedtime. May substitute to any manufacturer covered by patient's insurance. 1 each 0   Cholecalciferol (VITAMIN D3) 125 MCG (5000 UT) CAPS Take 5,000 Units by mouth every morning.  DROPLET PEN NEEDLES 32G X 6 MM MISC USE ONCE DAILY TO INJECT INSULIN . 100 each 3   empagliflozin  (JARDIANCE ) 10 MG TABS tablet Take 1 tablet (10 mg total) by mouth daily. for diabetes. 90 tablet 0   Glucose Blood (BLOOD GLUCOSE TEST STRIPS) STRP 1 each by In Vitro route in the morning, at noon, and at bedtime. May substitute to any manufacturer covered by patient's insurance. 300 strip 0   ibuprofen (ADVIL) 800 MG tablet Take 800 mg by mouth daily as needed (pain.).     IRON , FERROUS SULFATE, PO Take 1 tablet by mouth in the morning.     isosorbide  mononitrate (IMDUR ) 30 MG 24 hr tablet Take 0.5 tablets (15 mg total) by mouth at bedtime. (Patient taking differently: Take 15 mg by mouth at bedtime. Takes 1 tablet in the morning, then 0.5  tablets at nightly) 45 tablet 3   Lancets Misc. MISC 1 each by Does not apply route in the morning, at noon, and at bedtime. May substitute to any manufacturer covered by patient's insurance. 300 each 0   loperamide (IMODIUM A-D) 2 MG tablet Take 2 mg by mouth 4 (four) times daily as needed for diarrhea or loose stools (ibs symptoms.).     loratadine (CLARITIN) 10 MG tablet Take 10 mg by mouth daily as needed for allergies.     losartan  (COZAAR ) 100 MG tablet TAKE 1 TABLET (100 MG TOTAL) BY MOUTH DAILY. FOR BLOOD PRESSURE. 90 tablet 2   MAGNESIUM PO Take 1 tablet by mouth daily.     metFORMIN  (GLUCOPHAGE -XR) 500 MG 24 hr tablet TAKE 4 TABLETS (2,000 MG TOTAL) BY MOUTH DAILY WITH BREAKFAST. FOR DIABETES. 360 tablet 0   Multiple Vitamins-Minerals (CENTRUM SILVER ULTRA WOMENS PO) Take 1 tablet by mouth daily at 6 (six) AM.     nitroGLYCERIN  (NITROSTAT ) 0.4 MG SL tablet Place 1 tablet (0.4 mg total) under the tongue every 5 (five) minutes as needed for chest pain. 25 tablet PRN   oxyCODONE (OXY IR/ROXICODONE) 5 MG immediate release tablet 1-2 tablets every 4-6 hr as needed; #225 per month     pantoprazole  (PROTONIX ) 40 MG tablet Take 40 mg by mouth daily as needed.     promethazine (PHENERGAN) 25 MG tablet Take 25 mg by mouth every 8 (eight) hours as needed.     propranolol  ER (INDERAL  LA) 120 MG 24 hr capsule Take 1 capsule (120 mg total) by mouth at bedtime. For headache prevention 90 capsule 0   tirzepatide  (MOUNJARO ) 5 MG/0.5ML Pen Inject 5 mg into the skin once a week. for diabetes. 2 mL 0   No current facility-administered medications on file prior to visit.

## 2024-09-18 NOTE — Assessment & Plan Note (Signed)
 Compliant to atorvastatin  20 mg daily. Repeat lipid panel pending

## 2024-09-19 ENCOUNTER — Telehealth: Payer: Self-pay | Admitting: *Deleted

## 2024-09-19 ENCOUNTER — Ambulatory Visit: Payer: Self-pay | Admitting: Primary Care

## 2024-09-19 DIAGNOSIS — F419 Anxiety disorder, unspecified: Secondary | ICD-10-CM

## 2024-09-19 NOTE — Telephone Encounter (Signed)
 Copied from CRM #[id number]. Topic: Clinical - Prescription Issue >> Sep 18, 2024  3:59 PM Deleta RAMAN wrote: Reason for CRM: patient would like to let pcp know she would like to take lower dosage of the medication that was discussed at the visit on 12/17. Please contact the patient for questions or concerns. [facility]

## 2024-09-20 NOTE — Telephone Encounter (Signed)
 Lvm asking pt to call back. Pls get answers to [name]'s questions and relay her message.

## 2024-09-20 NOTE — Telephone Encounter (Signed)
 I'm not sure what she's speaking of? The Mounjaro  she's been taking for 2 weeks already? The lower dose really isn't a normal dose, it's a starter dose.   Can we find out some additional information and the reasoning behind her comment?

## 2024-09-20 NOTE — Telephone Encounter (Unsigned)
 Copied from CRM #[id number]. Topic: Clinical - Medication Question >> Sep 20, 2024 12:54 PM Viola F wrote: Reason for CRM: Patient returned Danielle Edwards's call regarding medication - she wants to let Comer Gaskins know that she agrees to start back on the DULoxetine  (CYMBALTA ) 30 MG DR capsule medication. She has plenty of the high dose but needs the lower dose sent to the [facility]. Please call her at  [phone]

## 2024-09-22 MED ORDER — DULOXETINE HCL 30 MG PO CPEP: 30.0000 mg | ORAL_CAPSULE | Freq: Every day | ORAL | 0 refills | Status: AC

## 2024-09-22 NOTE — Addendum Note (Signed)
 Addended by: Audreena Sachdeva K on: 09/22/2024 08:16 PM   Modules accepted: Orders

## 2024-09-22 NOTE — Telephone Encounter (Signed)
 Noted.   Have her take the 30 mg dose for 1 month, then increase to 60 mg that she has at home. If she feels well on the 30 mg dose then have her call us  and we will send more 30 mg.

## 2024-09-23 NOTE — Telephone Encounter (Signed)
 Spoke with pt and she is aware of [name]'s message. Nothing further was needed at this time.

## 2024-09-27 ENCOUNTER — Ambulatory Visit

## 2024-09-27 VITALS — Ht 66.0 in | Wt 248.0 lb

## 2024-09-27 DIAGNOSIS — Z0000 Encounter for general adult medical examination without abnormal findings: Secondary | ICD-10-CM | POA: Diagnosis not present

## 2024-09-27 NOTE — Progress Notes (Signed)
 "  Please attest and cosign this visit due to patients primary care provider not being in the office at the time the visit was completed.     Chief Complaint  Patient presents with   Medicare Wellness     Subjective:   Danielle Edwards is a 68 y.o. female who presents for a Medicare Annual Wellness Visit.  Visit info / Clinical Intake: Medicare Wellness Visit Type:: Subsequent Annual Wellness Visit Persons participating in visit and providing information:: patient Medicare Wellness Visit Mode:: Telephone If telephone:: video declined Since this visit was completed virtually, some vitals may be partially provided or unavailable. Missing vitals are due to the limitations of the virtual format.: Unable to obtain vitals - no equipment If Telephone or Video please confirm:: I connected with patient using audio/video enable telemedicine. I verified patient identity with two identifiers, discussed telehealth limitations, and patient agreed to proceed. Patient Location:: home Provider Location:: home office Interpreter Needed?: No Pre-visit prep was completed: yes AWV questionnaire completed by patient prior to visit?: no Living arrangements:: lives with spouse/significant other Patient's Overall Health Status Rating: (!) fair Typical amount of pain: some Does pain affect daily life?: (!) yes Are you currently prescribed opioids?: (!) yes  Dietary Habits and Nutritional Risks How many meals a day?: 2 Eats fruit and vegetables daily?: yes Most meals are obtained by: preparing own meals In the last 2 weeks, have you had any of the following?: (!) nausea, vomiting, diarrhea (some nausea with Oxycodone) Diabetic:: (!) yes Any non-healing wounds?: no How often do you check your BS?: continuous glucose monitor Would you like to be referred to a Nutritionist or for Diabetic Management? : no  Functional Status Activities of Daily Living (to include ambulation/medication):  Independent Ambulation: Independent Medication Administration: Independent Home Management (perform basic housework or laundry): Independent Manage your own finances?: yes Primary transportation is: driving Concerns about vision?: no *vision screening is required for WTM* Concerns about hearing?: no  Fall Screening Falls in the past year?: 0 Number of falls in past year: 0 Was there an injury with Fall?: 0 Fall Risk Category Calculator: 0 Patient Fall Risk Level: Low Fall Risk  Fall Risk Patient at Risk for Falls Due to: No Fall Risks Fall risk Follow up: Falls evaluation completed; Education provided; Falls prevention discussed  Home and Transportation Safety: All rugs have non-skid backing?: N/A, no rugs All stairs or steps have railings?: yes Grab bars in the bathtub or shower?: yes Have non-skid surface in bathtub or shower?: yes Good home lighting?: yes Regular seat belt use?: yes Hospital stays in the last year:: no  Cognitive Assessment Difficulty concentrating, remembering, or making decisions? : no Will 6CIT or Mini Cog be Completed: yes What year is it?: 0 points What month is it?: 0 points Give patient an address phrase to remember (5 components): [address] [place]  About what time is it?: 0 points Count backwards from 20 to 1: 0 points Say the months of the year in reverse: 4 points (pt got distracted with husband coming back home and talking w/her) Repeat the address phrase from earlier: 0 points 6 CIT Score: 4 points  Advance Directives (For Healthcare) Does Patient Have a Medical Advance Directive?: No Would patient like information on creating a medical advance directive?: No - Patient declined  Reviewed/Updated  Reviewed/Updated: Reviewed All (Medical, Surgical, Family, Medications, Allergies, Care Teams, Patient Goals)    Allergies (verified) Patient has no known allergies.   Current Medications (verified) Outpatient  Encounter  Medications as of 09/27/2024  Medication Sig   Accu-Chek FastClix Lancets MISC USE AS INSTRUCTED TO CHECK BLOOD SUGAR UP TO 3 TIMES DAILY   amLODipine  (NORVASC ) 10 MG tablet TAKE 1 TABLET (10 MG TOTAL) BY MOUTH DAILY. FOR BLOOD PRESSURE.   aspirin  EC 81 MG tablet Take 81 mg by mouth daily.   atorvastatin  (LIPITOR) 20 MG tablet Take 1 tablet (20 mg total) by mouth daily. for cholesterol.   Blood Glucose Monitoring Suppl DEVI 1 each by Does not apply route in the morning, at noon, and at bedtime. May substitute to any manufacturer covered by patient's insurance.   Cholecalciferol (VITAMIN D3) 125 MCG (5000 UT) CAPS Take 5,000 Units by mouth every morning.   DROPLET PEN NEEDLES 32G X 6 MM MISC USE ONCE DAILY TO INJECT INSULIN .   DULoxetine  (CYMBALTA ) 30 MG capsule Take 1 capsule (30 mg total) by mouth daily. for anxiety and depression.   empagliflozin  (JARDIANCE ) 10 MG TABS tablet Take 1 tablet (10 mg total) by mouth daily. for diabetes.   Glucose Blood (BLOOD GLUCOSE TEST STRIPS) STRP 1 each by In Vitro route in the morning, at noon, and at bedtime. May substitute to any manufacturer covered by patient's insurance.   ibuprofen (ADVIL) 800 MG tablet Take 800 mg by mouth daily as needed (pain.).   IRON , FERROUS SULFATE, PO Take 1 tablet by mouth in the morning.   isosorbide  mononitrate (IMDUR ) 30 MG 24 hr tablet Take 0.5 tablets (15 mg total) by mouth at bedtime. (Patient taking differently: Take 15 mg by mouth at bedtime. Takes 1 tablet in the morning, then 0.5 tablets at nightly)   Lancets Misc. MISC 1 each by Does not apply route in the morning, at noon, and at bedtime. May substitute to any manufacturer covered by patient's insurance.   loperamide (IMODIUM A-D) 2 MG tablet Take 2 mg by mouth 4 (four) times daily as needed for diarrhea or loose stools (ibs symptoms.).   loratadine (CLARITIN) 10 MG tablet Take 10 mg by mouth daily as needed for allergies.   losartan  (COZAAR ) 100 MG tablet TAKE 1  TABLET (100 MG TOTAL) BY MOUTH DAILY. FOR BLOOD PRESSURE.   MAGNESIUM PO Take 1 tablet by mouth daily.   metFORMIN  (GLUCOPHAGE -XR) 500 MG 24 hr tablet TAKE 4 TABLETS (2,000 MG TOTAL) BY MOUTH DAILY WITH BREAKFAST. FOR DIABETES.   Multiple Vitamins-Minerals (CENTRUM SILVER ULTRA WOMENS PO) Take 1 tablet by mouth daily at 6 (six) AM.   nitroGLYCERIN  (NITROSTAT ) 0.4 MG SL tablet Place 1 tablet (0.4 mg total) under the tongue every 5 (five) minutes as needed for chest pain.   oxyCODONE (OXY IR/ROXICODONE) 5 MG immediate release tablet 1-2 tablets every 4-6 hr as needed; #225 per month   pantoprazole  (PROTONIX ) 40 MG tablet Take 40 mg by mouth daily as needed.   promethazine (PHENERGAN) 25 MG tablet Take 25 mg by mouth every 8 (eight) hours as needed.   propranolol  ER (INDERAL  LA) 120 MG 24 hr capsule Take 1 capsule (120 mg total) by mouth at bedtime. For headache prevention   tirzepatide  (MOUNJARO ) 5 MG/0.5ML Pen Inject 5 mg into the skin once a week. for diabetes.   No facility-administered encounter medications on file as of 09/27/2024.    History: Past Medical History:  Diagnosis Date   Allergy    seasonal allergies   Anemia    hx of IDA   Anxiety    on meds   Arthritis    PRN  meds-osteoarthritis   Asthma    Cancer (HCC)    Had temoval of low grade cancerous bladder tumor Nov 2024   Cataract    sx to remove   Chronic kidney disease    CKD-stage 3   Chronic pain    COVID-19 06/02/2020   Depression    on meds   Diabetes type 2, controlled (HCC)    on meds   Fatigue 09/17/2019   Fibromyalgia    GERD (gastroesophageal reflux disease)    on meds   Headache    History of colonic polyps 1999   Benign   Hx of adenomatous polyp of colon 09/27/2000   2001 - diminutive adenoma [place] 2007 no polyps - [place]   Hyperlipidemia    on meds   Hypertension    on meds   Hypothyroidism    not on meds at this time   IBS (irritable bowel syndrome)    Diarrhea type   IDA (iron   deficiency anemia) 02/09/2022   Neuromuscular disorder (HCC)    Sleep apnea    never tested but cant lay flat   Vitamin D  deficiency    Past Surgical History:  Procedure Laterality Date   CARDIAC CATHETERIZATION     Test was normal   CHOLECYSTECTOMY  1995   COLONOSCOPY  multiple   COLONOSCOPY  2016   TA's   EYE SURGERY Bilateral    cataracts   LEFT HEART CATH AND CORONARY ANGIOGRAPHY Left 08/06/2024   Procedure: LEFT HEART CATH AND CORONARY ANGIOGRAPHY;  Surgeon: Mady Bruckner, MD;  Location: [facility];  Service: Cardiovascular;  Laterality: Left;   NASAL SINUS SURGERY  2001   POLYPECTOMY     TA's   TONSILLECTOMY     TONSILLECTOMY AND ADENOIDECTOMY     TRANSURETHRAL RESECTION OF BLADDER TUMOR N/A 08/08/2023   Procedure: TRANSURETHRAL RESECTION OF BLADDER TUMOR (TURBT) w/ INSTILLATION OF GEMCITABINE ;  Surgeon: Twylla Glendia BROCKS, MD;  Location: [facility];  Service: Urology;  Laterality: N/A;   VAGINAL HYSTERECTOMY     Complete   WISDOM TOOTH EXTRACTION     Family History  Problem Relation Age of Onset   Colon cancer Mother 13   Lung cancer Mother 41   Skin cancer Mother    Colon polyps Mother 12   Cancer Mother    Heart disease Father    Diabetes Father    Skin cancer Father    Heart failure Father    Colon polyps Brother 11   Heart murmur Brother    Kidney disease Paternal Aunt    Heart disease Maternal Grandmother    Stomach cancer Paternal Grandmother 37   Breast cancer Neg Hx    Esophageal cancer Neg Hx    Rectal cancer Neg Hx    Social History   Occupational History   Not on file  Tobacco Use   Smoking status: Never   Smokeless tobacco: Never  Vaping Use   Vaping status: Never Used  Substance and Sexual Activity   Alcohol use: Yes    Alcohol/week: 0.0 standard drinks of alcohol    Comment: rarely   Drug use: No   Sexual activity: Not on file   Tobacco Counseling Counseling given: Not Answered  SDOH Screenings   Food Insecurity: No  Food Insecurity (09/27/2024)  Housing: Unknown (09/27/2024)  Transportation Needs: No Transportation Needs (09/27/2024)  Utilities: Not At Risk (09/27/2024)  Alcohol Screen: Low Risk (09/27/2024)  Depression (PHQ2-9): Low Risk (09/27/2024)  Financial Resource Strain:  Low Risk (09/27/2024)  Physical Activity: Inactive (09/27/2024)  Social Connections: Socially Integrated (09/27/2024)  Stress: Stress Concern Present (09/27/2024)  Tobacco Use: Low Risk (09/27/2024)  Health Literacy: Adequate Health Literacy (09/27/2024)   See flowsheets for full screening details  Depression Screen PHQ 2 & 9 Depression Scale- Over the past 2 weeks, how often have you been bothered by any of the following problems? Little interest or pleasure in doing things: 0 Feeling down, depressed, or hopeless (PHQ Adolescent also includes...irritable): 0 PHQ-2 Total Score: 0 Trouble falling or staying asleep, or sleeping too much: 0 Feeling tired or having little energy: 0 Poor appetite or overeating (PHQ Adolescent also includes...weight loss): 0 Feeling bad about yourself - or that you are a failure or have let yourself or your family down: 0 Trouble concentrating on things, such as reading the newspaper or watching television (PHQ Adolescent also includes...like school work): 0 Moving or speaking so slowly that other people could have noticed. Or the opposite - being so fidgety or restless that you have been moving around a lot more than usual: 0 Thoughts that you would be better off dead, or of hurting yourself in some way: 0 PHQ-9 Total Score: 0 If you checked off any problems, how difficult have these problems made it for you to do your work, take care of things at home, or get along with other people?: Not difficult at all     Goals Addressed             This Visit's Progress    DIET - EAT MORE FRUITS AND VEGETABLES   On track    Improving my health       COMPLETED: Patient Stated       09/12/2019, I  will try to eat a healthier diet and decrease my carb intake.      Patient Stated   On track    02/23/2021, I will maintain and continue medications as prescribed.      Patient Stated   On track    Lose weight, eat health.             Objective:    Today's Vitals   09/27/24 1145  Weight: 248 lb (112.5 kg)  Height: 5' 6 (1.676 m)   Body mass index is 40.03 kg/m.  Hearing/Vision screen Vision Screening - Comments:: NOT UTD w/visits to Dr Octavia. Will schedule soon/wants to get through husband eye surgery first Immunizations and Health Maintenance Health Maintenance  Topic Date Due   OPHTHALMOLOGY EXAM  08/02/2023   Medicare Annual Wellness (AWV)  03/07/2024   DTaP/Tdap/Td (2 - Tdap) 06/19/2025 (Originally 12/02/2023)   Diabetic kidney evaluation - Urine ACR  12/19/2024   FOOT EXAM  12/19/2024   HEMOGLOBIN A1C  03/19/2025   Diabetic kidney evaluation - eGFR measurement  07/31/2025   Mammogram  01/09/2026   Colonoscopy  12/17/2028   Pneumococcal Vaccine: 50+ Years  Completed   Influenza Vaccine  Completed   Bone Density Scan  Completed   Hepatitis C Screening  Completed   Zoster Vaccines- Shingrix  Completed   Meningococcal B Vaccine  Aged Out   COVID-19 Vaccine  Discontinued        Assessment/Plan:  This is a routine wellness examination for Danielle Edwards.  Patient Care Team: Gretta Comer POUR, NP as PCP - General (Nurse Practitioner) End, Lonni, MD as PCP - Cardiology (Cardiology) Fate Morna SAILOR, [facility] (Inactive) as Pharmacist (Pharmacist) Babara Call, MD as Consulting Physician (Oncology) [facility] Avram, Lupita BRAVO, MD as Consulting Physician (Gastroenterology) Twylla Glendia BROCKS, MD (Urology) Dennise Capri, MD as Consulting Physician (Nephrology)  I have personally reviewed and noted the following in the patients chart:   Medical and social history Use of alcohol, tobacco or illicit drugs  Current medications and supplements  including opioid prescriptions. Functional ability and status Nutritional status Physical activity Advanced directives List of other physicians Hospitalizations, surgeries, and ER visits in previous 12 months Vitals Screenings to include cognitive, depression, and falls Referrals and appointments  No orders of the defined types were placed in this encounter.  In addition, I have reviewed and discussed with patient certain preventive protocols, quality metrics, and best practice recommendations. A written personalized care plan for preventive services as well as general preventive health recommendations were provided to patient.   Danielle LITTIE Saris, LPN   [id number]/[id number]   No follow-ups on file.  After Visit Summary: (MyChart) Due to this being a telephonic visit, the after visit summary with patients personalized plan was offered to patient via MyChart   Nurse Notes: Patient advised to keep follow-up appointment with PCP (3/26) Appointment(s) made: (Sept 2026 CPE) HM Addressed: Pt will make appt w/Dr Octavia soon  Pt hsays she has questions about a dx of heart blockage by her cardiologist (questions of tx and prognosis) but asymptomatic.I  encouraged pt to contact her Cardiologist address her concerns/questions. Pt concurs she will "

## 2024-09-27 NOTE — Patient Instructions (Signed)
 Danielle Edwards,  Thank you for taking the time for your Medicare Wellness Visit. I appreciate your continued commitment to your health goals. Please review the care plan we discussed, and feel free to reach out if I can assist you further.  Please note that Annual Wellness Visits do not include a physical exam. Some assessments may be limited, especially if the visit was conducted virtually. If needed, we may recommend an in-person follow-up with your provider.  Ongoing Care Seeing your primary care provider every 3 to 6 months helps us  monitor your health and provide consistent, personalized care.   Referrals If a referral was made during today's visit and you haven't received any updates within two weeks, please contact the referred provider directly to check on the status.  Recommended Screenings:  Health Maintenance  Topic Date Due   Eye exam for diabetics  08/02/2023   Medicare Annual Wellness Visit  03/07/2024   DTaP/Tdap/Td vaccine (2 - Tdap) 06/19/2025*   Yearly kidney health urinalysis for diabetes  12/19/2024   Complete foot exam   12/19/2024   Hemoglobin A1C  03/19/2025   Yearly kidney function blood test for diabetes  07/31/2025   Breast Cancer Screening  01/09/2026   Colon Cancer Screening  12/17/2028   Pneumococcal Vaccine for age over 21  Completed   Flu Shot  Completed   Osteoporosis screening with Bone Density Scan  Completed   Hepatitis C Screening  Completed   Zoster (Shingles) Vaccine  Completed   Meningitis B Vaccine  Aged Out   COVID-19 Vaccine  Discontinued  *Topic was postponed. The date shown is not the original due date.       09/27/2024   11:57 AM  Advanced Directives  Does Patient Have a Medical Advance Directive? No    Vision: Annual vision screenings are recommended for early detection of glaucoma, cataracts, and diabetic retinopathy. These exams can also reveal signs of chronic conditions such as diabetes and high blood pressure.  Dental: Annual  dental screenings help detect early signs of oral cancer, gum disease, and other conditions linked to overall health, including heart disease and diabetes.

## 2024-10-04 ENCOUNTER — Ambulatory Visit: Admitting: Urology

## 2024-10-07 ENCOUNTER — Other Ambulatory Visit: Payer: Self-pay | Admitting: Primary Care

## 2024-10-07 DIAGNOSIS — R519 Headache, unspecified: Secondary | ICD-10-CM

## 2024-10-07 DIAGNOSIS — E1165 Type 2 diabetes mellitus with hyperglycemia: Secondary | ICD-10-CM

## 2024-10-07 NOTE — Telephone Encounter (Signed)
 Please call patient:  How is she doing on the Mounjaro  5 mg dose for diabetes? How are blood sugars running?  Any bad side effects?

## 2024-10-08 NOTE — Telephone Encounter (Signed)
 Lvm asking pt to call back. Pls get answers to [name]'s questions, then route message back to Garvin.

## 2024-10-09 ENCOUNTER — Ambulatory Visit

## 2024-10-09 NOTE — Telephone Encounter (Signed)
 Copied from CRM [id number]. Topic: Clinical - Medication Question >> Oct 08, 2024  4:22 PM Jasmin G wrote: Reason for CRM: Pt wanted to let [name]'s team know that she is doing okay with the Mounjaro  5 mg and her blood sugar has ben on the normal range, pt described it as low to mid normal levels, no side effects. Pt requested for prescription to be called in to pharmacy today if possible.

## 2024-10-09 NOTE — Telephone Encounter (Signed)
 Patient returned call and stated that her blood sugars have been in the normal zone,but not low. She stated that she hasn't had any nausea or anything. She said that she has been feeling fine with the mounjaro . Patient stated that she discussed with Mallie staying on the 5mg  for another month,she said that she would like to do that.   [facility] [address], [place] [address] [phone] Phone: [phone]  Fax: [phone]

## 2024-10-10 NOTE — Telephone Encounter (Signed)
 Noted

## 2024-10-11 ENCOUNTER — Telehealth: Payer: Self-pay

## 2024-10-11 NOTE — Telephone Encounter (Signed)
 Copied from CRM [id number]. Topic: General - Other >> Oct 11, 2024  9:19 AM Wess RAMAN wrote: Reason for CRM: Patient would like to know if she had a heart xray in the past  Callback #: [phone]  Called patient and let know what she needs to have done is not the same test that they are wanting to do. They are wanting to do an ECHO. She will reach out to office to get set up.  No further action needed at this time.

## 2024-10-14 ENCOUNTER — Telehealth: Payer: Self-pay

## 2024-10-14 NOTE — Telephone Encounter (Signed)
Noted.  Will await update from patient.  

## 2024-10-14 NOTE — Telephone Encounter (Signed)
 Copied from CRM #[id number]. Topic: Clinical - Prescription Issue >> Oct 14, 2024  3:23 PM Carlyon D wrote: Reason for CRM: Pt is calling was suppose to pick up her Monjouro, pt states she use to pay 48$ but now its went up to 248$. Pt states she can not pay that every month. Pt would like a call back to further discuss.

## 2024-10-14 NOTE — Telephone Encounter (Signed)
 Spoke with pt returning her call concerning Mounjaro . States she tried to speak with insurance and thinks she has to meet her deductible again for the cost to go back down. Says she will call them back tomorrow to get more details and a better understanding. Says she may have to go to something else, if they still say her cost will be $248. Fyi to [place].

## 2024-10-15 NOTE — Telephone Encounter (Signed)
 Does she want to try Ozempic ? It may be cheaper.   She will likely run into the same problem with ANY medication as she likely has to hit a deductible. Including her prior medication.

## 2024-10-15 NOTE — Telephone Encounter (Signed)
 Noted

## 2024-10-15 NOTE — Telephone Encounter (Signed)
 Spoke with pt asking about trying Ozempic , per Mallie. Pt declines to change meds at this time because Mounjaro  is working for her. Says she still wants to talk with her insurance first to discuss deductible and will let Mallie know how she wants to proceed then.

## 2024-10-15 NOTE — Telephone Encounter (Unsigned)
 Copied from CRM [id number]. Topic: Clinical - Medication Question >> Oct 14, 2024  3:31 PM Carlyon D wrote: Reason for CRM: Pt states she has not had her monjouro in 3 days now due to the price going up. Pt states she may need to get back on the previous medication she was taking and would like a call back.

## 2024-10-27 ENCOUNTER — Other Ambulatory Visit: Payer: Self-pay | Admitting: Primary Care

## 2024-10-27 DIAGNOSIS — E1165 Type 2 diabetes mellitus with hyperglycemia: Secondary | ICD-10-CM

## 2024-11-05 ENCOUNTER — Telehealth: Payer: Self-pay | Admitting: Internal Medicine

## 2024-11-05 NOTE — Telephone Encounter (Signed)
 Pt requesting a new order for a Echo.

## 2024-12-18 ENCOUNTER — Ambulatory Visit: Admitting: Student

## 2024-12-25 ENCOUNTER — Ambulatory Visit: Admitting: Primary Care

## 2025-06-25 ENCOUNTER — Encounter: Admitting: Primary Care
